# Patient Record
Sex: Female | Born: 1965 | Race: White | Hispanic: No | Marital: Married | Smoking: Current every day smoker
Health system: Southern US, Community
[De-identification: ages and names within clinical notes are randomized; demographics above are authoritative.]

## PROBLEM LIST (undated history)

## (undated) DIAGNOSIS — Z87442 Personal history of urinary calculi: Secondary | ICD-10-CM

## (undated) DIAGNOSIS — J449 Chronic obstructive pulmonary disease, unspecified: Secondary | ICD-10-CM

## (undated) DIAGNOSIS — M1990 Unspecified osteoarthritis, unspecified site: Secondary | ICD-10-CM

## (undated) DIAGNOSIS — F32A Depression, unspecified: Secondary | ICD-10-CM

## (undated) DIAGNOSIS — I739 Peripheral vascular disease, unspecified: Secondary | ICD-10-CM

## (undated) DIAGNOSIS — C801 Malignant (primary) neoplasm, unspecified: Secondary | ICD-10-CM

## (undated) DIAGNOSIS — F419 Anxiety disorder, unspecified: Secondary | ICD-10-CM

## (undated) DIAGNOSIS — I6529 Occlusion and stenosis of unspecified carotid artery: Secondary | ICD-10-CM

## (undated) DIAGNOSIS — I10 Essential (primary) hypertension: Secondary | ICD-10-CM

## (undated) DIAGNOSIS — G459 Transient cerebral ischemic attack, unspecified: Secondary | ICD-10-CM

## (undated) DIAGNOSIS — T7840XA Allergy, unspecified, initial encounter: Secondary | ICD-10-CM

## (undated) DIAGNOSIS — E785 Hyperlipidemia, unspecified: Secondary | ICD-10-CM

## (undated) DIAGNOSIS — D649 Anemia, unspecified: Secondary | ICD-10-CM

## (undated) DIAGNOSIS — E1159 Type 2 diabetes mellitus with other circulatory complications: Secondary | ICD-10-CM

## (undated) DIAGNOSIS — F329 Major depressive disorder, single episode, unspecified: Secondary | ICD-10-CM

## (undated) DIAGNOSIS — K219 Gastro-esophageal reflux disease without esophagitis: Secondary | ICD-10-CM

## (undated) DIAGNOSIS — R011 Cardiac murmur, unspecified: Secondary | ICD-10-CM

## (undated) DIAGNOSIS — J45909 Unspecified asthma, uncomplicated: Secondary | ICD-10-CM

## (undated) DIAGNOSIS — I5189 Other ill-defined heart diseases: Secondary | ICD-10-CM

## (undated) HISTORY — DX: Depression, unspecified: F32.A

## (undated) HISTORY — DX: Type 2 diabetes mellitus with other circulatory complications: E11.59

## (undated) HISTORY — DX: Anemia, unspecified: D64.9

## (undated) HISTORY — PX: FEMORAL BYPASS: SHX50

## (undated) HISTORY — DX: Peripheral vascular disease, unspecified: I73.9

## (undated) HISTORY — DX: Anxiety disorder, unspecified: F41.9

## (undated) HISTORY — DX: Other ill-defined heart diseases: I51.89

## (undated) HISTORY — DX: Transient cerebral ischemic attack, unspecified: G45.9

## (undated) HISTORY — DX: Essential (primary) hypertension: I10

## (undated) HISTORY — DX: Hyperlipidemia, unspecified: E78.5

## (undated) HISTORY — PX: CHOLECYSTECTOMY: SHX55

## (undated) HISTORY — PX: CAROTID ENDARTERECTOMY: SUR193

## (undated) HISTORY — DX: Occlusion and stenosis of unspecified carotid artery: I65.29

## (undated) HISTORY — PX: OTHER SURGICAL HISTORY: SHX169

## (undated) HISTORY — DX: Cardiac murmur, unspecified: R01.1

## (undated) HISTORY — DX: Allergy, unspecified, initial encounter: T78.40XA

## (undated) HISTORY — PX: TUBAL LIGATION: SHX77

## (undated) HISTORY — DX: Major depressive disorder, single episode, unspecified: F32.9

---

## 2000-01-21 ENCOUNTER — Encounter: Payer: Self-pay | Admitting: Family Medicine

## 2000-01-21 ENCOUNTER — Encounter: Admission: RE | Admit: 2000-01-21 | Discharge: 2000-01-21 | Payer: Self-pay | Admitting: Family Medicine

## 2002-04-13 ENCOUNTER — Other Ambulatory Visit: Admission: RE | Admit: 2002-04-13 | Discharge: 2002-04-13 | Payer: Self-pay | Admitting: Family Medicine

## 2002-04-19 ENCOUNTER — Encounter: Payer: Self-pay | Admitting: Family Medicine

## 2002-04-19 ENCOUNTER — Encounter: Admission: RE | Admit: 2002-04-19 | Discharge: 2002-04-19 | Payer: Self-pay | Admitting: Family Medicine

## 2002-06-20 ENCOUNTER — Encounter: Payer: Self-pay | Admitting: Family Medicine

## 2002-06-20 ENCOUNTER — Encounter: Admission: RE | Admit: 2002-06-20 | Discharge: 2002-06-20 | Payer: Self-pay | Admitting: Family Medicine

## 2002-07-05 ENCOUNTER — Encounter (INDEPENDENT_AMBULATORY_CARE_PROVIDER_SITE_OTHER): Payer: Self-pay | Admitting: Specialist

## 2002-07-05 ENCOUNTER — Observation Stay (HOSPITAL_COMMUNITY): Admission: RE | Admit: 2002-07-05 | Discharge: 2002-07-06 | Payer: Self-pay | Admitting: *Deleted

## 2002-07-11 ENCOUNTER — Encounter: Payer: Self-pay | Admitting: General Surgery

## 2002-07-11 ENCOUNTER — Ambulatory Visit (HOSPITAL_COMMUNITY): Admission: RE | Admit: 2002-07-11 | Discharge: 2002-07-11 | Payer: Self-pay | Admitting: General Surgery

## 2002-07-13 ENCOUNTER — Encounter: Payer: Self-pay | Admitting: Gastroenterology

## 2002-07-13 ENCOUNTER — Ambulatory Visit (HOSPITAL_COMMUNITY): Admission: RE | Admit: 2002-07-13 | Discharge: 2002-07-13 | Payer: Self-pay | Admitting: Gastroenterology

## 2002-08-04 ENCOUNTER — Encounter: Admission: RE | Admit: 2002-08-04 | Discharge: 2002-08-04 | Payer: Self-pay | Admitting: *Deleted

## 2002-10-13 ENCOUNTER — Encounter: Payer: Self-pay | Admitting: Internal Medicine

## 2002-10-13 ENCOUNTER — Inpatient Hospital Stay (HOSPITAL_COMMUNITY): Admission: EM | Admit: 2002-10-13 | Discharge: 2002-10-15 | Payer: Self-pay | Admitting: Emergency Medicine

## 2002-10-14 ENCOUNTER — Encounter: Payer: Self-pay | Admitting: Internal Medicine

## 2002-10-21 ENCOUNTER — Ambulatory Visit (HOSPITAL_COMMUNITY): Admission: RE | Admit: 2002-10-21 | Discharge: 2002-10-21 | Payer: Self-pay | Admitting: *Deleted

## 2002-10-21 ENCOUNTER — Encounter: Payer: Self-pay | Admitting: *Deleted

## 2002-11-01 ENCOUNTER — Inpatient Hospital Stay (HOSPITAL_COMMUNITY): Admission: RE | Admit: 2002-11-01 | Discharge: 2002-11-02 | Payer: Self-pay | Admitting: *Deleted

## 2002-11-01 ENCOUNTER — Encounter (INDEPENDENT_AMBULATORY_CARE_PROVIDER_SITE_OTHER): Payer: Self-pay | Admitting: Specialist

## 2002-11-29 ENCOUNTER — Inpatient Hospital Stay (HOSPITAL_COMMUNITY): Admission: RE | Admit: 2002-11-29 | Discharge: 2002-12-02 | Payer: Self-pay | Admitting: *Deleted

## 2003-06-13 ENCOUNTER — Encounter: Payer: Self-pay | Admitting: *Deleted

## 2003-06-15 ENCOUNTER — Ambulatory Visit (HOSPITAL_COMMUNITY): Admission: RE | Admit: 2003-06-15 | Discharge: 2003-06-15 | Payer: Self-pay | Admitting: *Deleted

## 2004-01-10 ENCOUNTER — Ambulatory Visit (HOSPITAL_COMMUNITY): Admission: RE | Admit: 2004-01-10 | Discharge: 2004-01-10 | Payer: Self-pay | Admitting: *Deleted

## 2004-01-25 ENCOUNTER — Inpatient Hospital Stay (HOSPITAL_COMMUNITY): Admission: RE | Admit: 2004-01-25 | Discharge: 2004-01-27 | Payer: Self-pay | Admitting: *Deleted

## 2007-03-18 ENCOUNTER — Ambulatory Visit: Payer: Self-pay | Admitting: *Deleted

## 2007-05-26 ENCOUNTER — Ambulatory Visit: Payer: Self-pay | Admitting: *Deleted

## 2008-03-09 ENCOUNTER — Ambulatory Visit: Payer: Self-pay | Admitting: *Deleted

## 2008-03-23 ENCOUNTER — Ambulatory Visit: Payer: Self-pay | Admitting: *Deleted

## 2008-03-24 ENCOUNTER — Ambulatory Visit: Payer: Self-pay | Admitting: *Deleted

## 2008-03-24 ENCOUNTER — Inpatient Hospital Stay (HOSPITAL_COMMUNITY): Admission: RE | Admit: 2008-03-24 | Discharge: 2008-03-25 | Payer: Self-pay | Admitting: *Deleted

## 2008-03-24 ENCOUNTER — Encounter (INDEPENDENT_AMBULATORY_CARE_PROVIDER_SITE_OTHER): Payer: Self-pay | Admitting: *Deleted

## 2008-04-13 ENCOUNTER — Ambulatory Visit: Payer: Self-pay | Admitting: *Deleted

## 2008-08-24 ENCOUNTER — Ambulatory Visit: Payer: Self-pay | Admitting: *Deleted

## 2008-10-12 ENCOUNTER — Ambulatory Visit: Payer: Self-pay | Admitting: *Deleted

## 2009-04-05 ENCOUNTER — Ambulatory Visit: Payer: Self-pay | Admitting: *Deleted

## 2009-10-10 ENCOUNTER — Ambulatory Visit: Payer: Self-pay | Admitting: Vascular Surgery

## 2010-03-28 ENCOUNTER — Ambulatory Visit: Payer: Self-pay | Admitting: Vascular Surgery

## 2010-05-01 ENCOUNTER — Ambulatory Visit: Payer: Self-pay | Admitting: Vascular Surgery

## 2010-11-21 ENCOUNTER — Ambulatory Visit: Payer: Self-pay | Admitting: Vascular Surgery

## 2010-12-20 ENCOUNTER — Ambulatory Visit: Payer: Self-pay | Admitting: Vascular Surgery

## 2011-05-06 NOTE — Procedures (Signed)
BYPASS GRAFT EVALUATION   INDICATION:  Patient complains of left leg claudication.  Followup  status post right-to-left fem-fem bypass graft and bilateral common  femoral artery endarterectomies and right common iliac artery stent.   HISTORY:  Diabetes:  No.  Cardiac:  No.  Hypertension:  No.  Smoking:  Quit in 2007.  Previous Surgery:  Right common iliac artery stent on June 15, 2003,  right-to-left fem-fem bypass graft on November 29, 2002.   SINGLE LEVEL ARTERIAL EXAM                               RIGHT              LEFT  Brachial:                    140                144  Anterior tibial:             86                 86  Posterior tibial:            78                 90  Peroneal:  Ankle/brachial index:        0.54               0.63   PREVIOUS ABI:  Date: 03/18/2007  RIGHT:  0.61  LEFT:  0.67   LOWER EXTREMITY BYPASS GRAFT DUPLEX EXAM:   DUPLEX:  Doppler arterial waveforms are monophasic proximal to, within  and distal to the right-to-left fem-fem bypass graft.  Peak systolic  velocity in the common iliac artery is 81 cm/s, external iliac 271 cm/s  and distal external iliac 100 cm/s.   IMPRESSION:  1. ABIs are stable from previous study bilaterally.  2. Patent right-to-left fem-fem bypass graft.  3. 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   ___________________________________________  P. Liliane Bade, M.D.   MC/MEDQ  D:  03/09/2008  T:  03/09/2008  Job:  [id number]

## 2011-05-06 NOTE — Assessment & Plan Note (Signed)
OFFICE VISIT   ALANYA, VUKELICH A  DOB:  09/28/1966                                       03/09/2008  QMVHQ#:[id number]   The patient is a 45 year old female with advanced vascular disease.  She  has a history of right carotid endarterectomy, placement of a right  common iliac stent along with right to left femoral-femoral bypass.  She  underwent lab evaluation today, including carotid duplex scan, which  revealed occlusion of a right internal carotid artery status post  endarterectomy, and a left ICA stenosis of 80-99%.   Her lower extremity evaluation reveals stable ankle brachial index at  0.54 on the right and 0.63 on the left.  Wave forms are monophasic  proximal to, within, and distal to the femoral-femoral bypass graft.  Imaging of the right iliac system does reveal a right external iliac  stenosis estimated to be greater than 50%.   Unfortunately, the patient is smoking up to 1 pack of cigarettes daily.   CURRENT MEDICATIONS:  Include Vytorin 10/20 daily, Effexor 75 mg daily,  Plavix 75 mg daily, and aspirin 325 mg daily.   She is sensitive to contrast dye.   Evaluation reveals a 45 year old female who appears older than her  stated age.  BP 146/85 left arm, 149/88 right arm, pulse 88 per minute,  respirations 18 per minute, O2 saturation 98%.   She has left carotid bruit.  Her right neck incision is well-healed.  Cranial nerves intact.  Strength equal bilaterally.   Heart sounds are normal without murmurs.  Chest is clear.   Abdomen is soft and nontender.  No masses or organomegaly.  Patent right  to left femoral-femoral bypass with soft femoral bruits present  bilaterally.  No popliteal, posterior tibial, or dorsalis pedis pulses  palpable.   The patient shows evidence of significant progression of her left  internal carotid artery disease with a high-grade stenosis and now has  an occlusion of the right internal carotid artery.  She  will be  scheduled to undergo left carotid endarterectomy on 03/24/2008 at [facility].  She will discontinue Plavix 1 week prior to this.   Balinda Quails, M.D.  Electronically Signed   PGH/MEDQ  D:  03/09/2008  T:  03/10/2008  Job:  [id number]

## 2011-05-06 NOTE — Op Note (Signed)
Andrea [name], Andrea [name]             ACCOUNT NO.:  [id number]   MEDICAL RECORD NO.:  [id number]          PATIENT TYPE:  INP   LOCATION:  2899                         FACILITY:  [facility]   PHYSICIAN:  Balinda Quails, M.D.    DATE OF BIRTH:  31-Mar-1966   DATE OF PROCEDURE:  03/24/2008  DATE OF DISCHARGE:                               OPERATIVE REPORT   SURGEON:  Balinda Quails, MD   ASSISTANT:  RNFA.   ANESTHETIC:  General endotracheal.   ANESTHESIOLOGIST:  Bedelia Person, MD   PREOPERATIVE DIAGNOSIS:  Severe progressive left internal carotid artery  stenosis.   POSTOPERATIVE DIAGNOSIS:  Severe progressive left internal carotid  artery stenosis.   PROCEDURE:  Left carotid endarterectomy with Dacron patch angioplasty.   CLINICAL NOTE:  Andrea [name] is a 45 year old vasculopath with a  history of a right carotid endarterectomy and right-to-left femoral-  femoral bypass.  Recent carotid Doppler evaluation carried out per  protocol revealed occlusion of her right carotid endarterectomy site and  a progressive left internal carotid artery stenosis.  Due to the  aggressive nature of her disease she is brought to the operating room at  this time for left carotid endarterectomy for reduction of stroke risk.   PROCEDURE IN DETAIL:  The patient was brought to the operating room in  stable condition.  Placed under general endotracheal anesthesia.  Foley  catheter and arterial line in place.  Left neck prepped and draped in a  sterile fashion.   Curvilinear skin incision made along the anterior border of the left  sternomastoid muscle.  Platysma divided with electrocautery.  Deep  dissection carried along the anterior border of the sternomastoid to the  carotid vessels.  The facial vein ligated with 2-0 silk and divided.  The common carotid artery mobilized down to the omohyoid muscle and  encircled with a vessel loop.  The vagus nerve reflected posteriorly and  preserved.  The origin of the  superior thyroid and external carotid  freed and encircled with vessel loop.  The internal carotid artery  followed distally up to the posterior belly of the digastric muscle.  The hypoglossal nerve reflected superiorly and preserved.  The distal  internal carotid artery encircled with a vessel loop.   Evaluation of the carotid bifurcation revealed small caliber vessels and  also plaque within the bulb extending into the left internal carotid  artery.  The patient administered 7000 units of heparin intravenously.   The carotid vessels controlled with clamp.  Longitudinal arteriotomy  made in the distal common carotid artery.  The arteriotomy extended  across the carotid bulb and up into the internal carotid artery beyond  the plaque.  There was a high-grade left internal carotid artery  stenosis.  Moderate amount of plaque, no ulceration.  A shunt was then  inserted.   The plaque then removed with an endarterectomy elevator.  The  endarterectomy carried down to the common carotid artery where the  plaque was divided transversely with Potts scissors.  Plaque raised up  in the bulb where the superior thyroid and external carotid were  endarterectomized using  an eversion technique.  The distal internal  carotid artery plaque feathered out well.  Fragments of plaque were  removed with fine forceps.  Site irrigated with heparin, saline and  dextran solutions.  A patch angioplasty at the endarterectomy site then  carried out with a running 6-0 Prolene suture using a Finesse Dacron  patch.  At completion of the patch angioplasty the shunt was removed.  All vessels well flushed.  Clamps were removed directing initial  antegrade flow up the external carotid artery, following this the  internal carotid artery was released.   Excellent pulse and Doppler signal in the distal internal carotid  artery.  Adequate hemostasis obtained.  The patient administered 50 mg  of protamine intravenously.   Sponges and instrument counts were correct.  Sternomastoid fascia closed with running 2-0 Vicryl suture.  Platysma  closed with running 3-0 Vicryl suture.  Skin closed with 4-0 Monocryl.  Dermabond applied.  Anesthesia was reversed in the operating room.  The  patient awakened readily.  Moved all extremities to command.  Transferred to recovery room in stable condition.      Balinda Quails, M.D.  Electronically Signed     PGH/MEDQ  D:  03/24/2008  T:  03/24/2008  Job:  [id number]

## 2011-05-06 NOTE — Procedures (Signed)
CAROTID DUPLEX EXAM   INDICATION:  Followup evaluation of known carotid artery disease.   HISTORY:  Diabetes:  No.  Cardiac:  No.  Hypertension:  No.  Smoking:  Quit in 2007.  Previous Surgery:  Right carotid endarterectomy on November 01, 2002.  CV History:  Patient reports no cerebrovascular symptoms at this time.  Amaurosis Fugax No, Paresthesias No, Hemiparesis No                                       RIGHT             LEFT  Brachial systolic pressure:         140               144  Brachial Doppler waveforms:         Triphasic         Triphasic  Vertebral direction of flow:        Antegrade         Antegrade  DUPLEX VELOCITIES (cm/sec)  CCA peak systolic                   82                135  ECA peak systolic                   372               143  ICA peak systolic                   Occluded          467  ICA end diastolic                   Occluded          217  PLAQUE MORPHOLOGY:                  Mixed             Mixed focal  PLAQUE AMOUNT:                      Severe            Severe  PLAQUE LOCATION:                    Throughout ICA, proximal ECA        Proximal ICA   IMPRESSION:  1. Occluded right ICA status post endarterectomy (cannot rule out      trickle flow).  2. Right ECA stenosis.  3. Left ICA stenosis 80-99%.   ___________________________________________  P. Liliane Bade, M.D.   MC/MEDQ  D:  03/09/2008  T:  03/09/2008  Job:  [id number]

## 2011-05-06 NOTE — Procedures (Signed)
BYPASS GRAFT EVALUATION   INDICATION:  Followup evaluation of right common iliac stent and right  to left fem-fem bypass graft.   HISTORY:  Diabetes:  No.  Cardiac:  No.  Hypertension:  No.  Smoking:  Recently quit.  Previous Surgery:  Right common iliac artery stent on 06/15/2003, right  to left fem-fem bypass graft 11/29/2002.   SINGLE LEVEL ARTERIAL EXAM                               RIGHT              LEFT  Brachial:                    148                146  Anterior tibial:             84                 78  Posterior tibial:            90                 84  Peroneal:  Ankle/brachial index:        0.61               0.57   PREVIOUS ABI:  Date:  10/12/2008  RIGHT:  0.66  LEFT:  0.65   LOWER EXTREMITY BYPASS GRAFT DUPLEX EXAM:   DUPLEX:  Doppler arterial waveforms are monophasic proximal to, within  and distal to the right to left fem-fem bypass graft.  Right common iliac artery was imaged.  No evidence of elevated peak  systolic velocities throughout.   IMPRESSION:  1. ABIs are stable from previous study bilaterally.  2. Patent right common iliac artery stent with no evidence of      restenosis.  3. Patent right to left fem-fem bypass graft.   ___________________________________________  P. Liliane Bade, M.D.   MC/MEDQ  D:  04/05/2009  T:  04/05/2009  Job:  [id number]

## 2011-05-06 NOTE — Procedures (Signed)
CAROTID DUPLEX EXAM   INDICATION:  Follow-up evaluation of known carotid artery disease.   HISTORY:  Diabetes:  No.  Cardiac:  No.  Hypertension:  No.  Smoking:  Quit in 2007.  Previous Surgery:  Right carotid endarterectomy on 11/01/2002.  Left  carotid endarterectomy on 03/24/2008.  CV History:  Patient has a known right ICA occlusion.  Amaurosis Fugax No, Paresthesias No, Hemiparesis No.                                       RIGHT             LEFT  Brachial systolic pressure:         135               130  Brachial Doppler waveforms:         Triphasic         Triphasic  Vertebral direction of flow:        Antegrade         Antegrade  DUPLEX VELOCITIES (cm/sec)  CCA peak systolic                   60                272  ECA peak systolic                   371               80  ICA peak systolic                   Occluded          201  ICA end diastolic                   Occluded          53  PLAQUE MORPHOLOGY:                  Mixed             Soft  PLAQUE AMOUNT:                      Severe            Moderate  PLAQUE LOCATION:                    Throughout ICA    Proximal Dacron  patch, proximal ICA   IMPRESSION:  1. Occluded right internal carotid artery.  2. Elevated peak systolic velocities (272 cm/s at the proximal end of      the Dacron patch in the left common carotid artery).  3. 40-59% left internal carotid artery stenosis, status post      endarterectomy.   ___________________________________________  P. Liliane Bade, M.D.   MC/MEDQ  D:  10/12/2008  T:  10/13/2008  Job:  [id number]

## 2011-05-06 NOTE — Assessment & Plan Note (Signed)
OFFICE VISIT   Andrea Andrea [name], Andrea Andrea [name]  DOB:  03-13-1966                                       05/01/2010  EAVWU#:[id number]   The patient is Andrea [name] 45 year old female who has previously been followed by  Dr. Madilyn Andrea [name] for carotid and lower extremity occlusive disease.  Since he  has left town I have assumed her care.   CHRONIC MEDICAL PROBLEMS:  Include diabetes, hypertension, elevated  cholesterol.  These are all currently followed by Dr. Egbert Andrea [name] and are  under control.  She presents today for followup regarding her carotid  and lower extremity occlusive disease.  She denies any symptoms of TIA,  amaurosis or stroke.  She denies any claudication symptoms.  She has no  symptoms of rest pain.  She has some occasional numbness and tingling in  her fingers which has been fairly recent onset.   REVIEW OF SYSTEMS:  Andrea [name] full 12 point review of systems was performed with  the patient today.  Please see intake referral form for details  regarding this.   FAMILY HISTORY:  Remarkable for her mother who had vascular disease at  age 65.   SOCIAL HISTORY:  She is single.  She has one child.  She currently  smokes one pack of cigarettes per day.   Femoral-femoral bypass in December of 2003.  This was revised in  February of 2005.  She has also had two previous iliac artery stents.  She had Andrea [name] left carotid endarterectomy in April of 2009.  She had Andrea [name] right  carotid endarterectomy in November of 2003.  She has Andrea [name] known chronic  right internal carotid artery occlusion.   PAST SURGICAL HISTORY:  Also remarkable for tubal ligation,  cholecystectomy and Andrea [name] C-section.   MEDICATIONS:  1. Include glipizide 10 mg twice Andrea [name] day.  2. Plavix 75 mg once Andrea [name] day.  3. Aspirin 325 mg once Andrea [name] day.  4. Amlodipine 10 mg once Andrea [name] day.  5. Effexor 75 mg once Andrea [name] day.  6. Xanax 0.25 mg t.i.d. p.r.n.  7. Vytorin 10/20 once Andrea [name] day.   ALLERGIES:  She has allergy listed to contrast which causes her hives  and  this has been okay with Benadryl in the past.   She had bilateral ABIs performed on 03/28/2010 which were 0.79 on the  right, 0.73 on the left which is slightly decreased from October of  2010.  She did have Andrea [name] patent femoral-femoral bypass graft at that time  as well as Andrea [name] patent right common iliac artery stent.   She had Andrea [name] carotid duplex exam also on 03/28/2010 which showed chronic  occlusion of her right internal carotid artery and Andrea [name] 60%-80% stenosis of  the left internal carotid artery.  She had antegrade vertebral flow  bilaterally.   PHYSICAL EXAMINATION:  Vital signs:  On physical exam today blood  pressure is 149/81 in the left arm, 158/85 in the right arm, heart rate  is 89 and regular, respirations 20.  HEENT:  Unremarkable.  Neck:  Has  2+ carotid pulses without bruit.  Chest:  Clear to auscultation.  Cardiac:  Regular rate and rhythm without murmur.  Abdomen:  Soft,  nontender, nondistended.  No masses.  Extremities:  She has 1+ femoral  pulses bilaterally.  She has absent popliteal and pedal pulses  bilaterally.  She has 2+ radial pulses  bilaterally.  Neurological:  She  has symmetric upper extremity and lower extremity motor strength which  is 5/5 and symmetric.  Skin:  Has no open ulcers or rashes.  Musculoskeletal:  Exam shows no significant major joint deformities.   ASSESSMENT:  Carotid and lower extremity arterial occlusive disease  stable currently.  She will continue risk factor modification and try to  improve her blood pressure control, elevation of cholesterol and control  her diabetes.  We also spent some time today regarding smoking  cessation.  She will continue her aspirin and Plavix as antiplatelet  therapy.  She will follow up in six months' time for repeat carotid  duplex and ABIs.  She will return sooner if she has any symptoms of  neurologic dysfunction or worsening claudication symptoms.     Andrea Hora. Fields, MD  Electronically Signed   CEF/MEDQ   D:  05/01/2010  T:  05/02/2010  Job:  [id number]   cc:   Cheri Rous

## 2011-05-06 NOTE — Procedures (Signed)
CAROTID DUPLEX EXAM   INDICATION:  Follow up of carotid disease.   HISTORY:  Diabetes:  No.  Cardiac:  No.  Hypertension:  No.  Smoking:  Quit.  Previous Surgery:  Right carotid endarterectomy in 10/2002, left carotid  endarterectomy in 03/2008.  CV History:                                       RIGHT             LEFT  Brachial systolic pressure:         130               136  Brachial Doppler waveforms:         Triphasic         Triphasic  Vertebral direction of flow:        Antegrade         Antegrade  DUPLEX VELOCITIES (cm/sec)  CCA peak systolic                   53                115  ECA peak systolic                   361               161  ICA peak systolic                   Occluded          305  ICA end diastolic                   Occluded          87  PLAQUE MORPHOLOGY:                  Calcified         Calcified  PLAQUE AMOUNT:                      Severe            Moderate  PLAQUE LOCATION:                    ICA               ICA   IMPRESSION:  1. Right internal carotid artery is occluded.  2. Right external carotid artery with 60-79% stenosis.  3. Left internal carotid artery with 60-79% stenosis.     ___________________________________________  Larina Earthly, M.D.   CJ/MEDQ  D:  10/10/2009  T:  10/10/2009  Job:  [id number]

## 2011-05-06 NOTE — Procedures (Signed)
BYPASS GRAFT EVALUATION   INDICATION:  Right femoral-to-femoral bypass graft, right CIA stent.   HISTORY:  Diabetes:  No.  Cardiac:  No.  Hypertension:  No.  Smoking:  Quit.  Previous Surgery:  Right CIA stent in June, 2004, fem-fem bypass graft  in December, 2003.   SINGLE LEVEL ARTERIAL EXAM                               RIGHT              LEFT  Brachial:                    130                136  Anterior tibial:             95                 98  Posterior tibial:            117                118  Peroneal:  Ankle/brachial index:        0.86               0.87   PREVIOUS ABI:  Date: 04/05/09  RIGHT:  0.61  LEFT:  0.57   LOWER EXTREMITY BYPASS GRAFT DUPLEX EXAM:   DUPLEX:  Biphasic waveforms throughout the bypass graft and native  arteries.   IMPRESSION:  1. Ankle brachial indices suggest mild disease.  2. Patent bypass graft, no evidence of stenosis noted.  3. Common iliac artery stent also appears patent with no evidence of      stenosis.   ___________________________________________  Larina Earthly, M.D.   CJ/MEDQ  D:  10/10/2009  T:  10/10/2009  Job:  [id number]

## 2011-05-06 NOTE — Procedures (Signed)
BYPASS GRAFT EVALUATION   INDICATION:  Followup evaluation of lower extremity revascularization  procedure.   HISTORY:  Diabetes:  No.  Cardiac:  No.  Hypertension:  No.  Smoking:  Quit in 2007.  Previous Surgery:  Right common iliac stent.  Right to left fem-fem  bypass graft.   SINGLE LEVEL ARTERIAL EXAM                               RIGHT              LEFT  Brachial:                    135                130  Anterior tibial:             89                 87  Posterior tibial:            88                 88  Peroneal:  Ankle/brachial index:        0.66               0.65   PREVIOUS ABI:  Date:  03/09/2008  RIGHT:  0.54  LEFT:  0.63   LOWER EXTREMITY BYPASS GRAFT DUPLEX EXAM:   DUPLEX:  Doppler arterial waveform is monophasic throughout the right  common iliac artery and the right to left fem-fem bypass graft.   IMPRESSION:  1. ABIs are stable from previous study bilaterally.  2. Patent fem-fem bypass graft.  3. Patent right common iliac artery stent.       ___________________________________________  P. Liliane Bade, M.D.   MC/MEDQ  D:  10/12/2008  T:  10/13/2008  Job:  [id number]

## 2011-05-06 NOTE — Procedures (Signed)
BYPASS GRAFT EVALUATION   INDICATION:  Right femoral-to-femoral bypass graft, right common iliac  stent.   HISTORY:  Diabetes:  No.  Cardiac:  No.  Hypertension:  No.  Smoking:  Quit.  Previous Surgery:  Right common iliac stent in June, 2004, femoral-  femoral bypass graft in December, 2003.   SINGLE LEVEL ARTERIAL EXAM                               RIGHT              LEFT  Brachial:                    168                152  Anterior tibial:             123                108  Posterior tibial:            132                123  Peroneal:  Ankle/brachial index:        0.79               0.73   PREVIOUS ABI:  Date: 10/10/09  RIGHT:  0.86  LEFT:  0.87   LOWER EXTREMITY BYPASS GRAFT DUPLEX EXAM:   DUPLEX:  Patent femoral-femoral bypass graft with no evidence of  stenosis.   IMPRESSION:  1. Bilateral ankle brachial indices suggest moderate arterial disease.  2. Patent femoral-femoral bypass graft with no evidence of stenosis.  3. Patent right common iliac artery stent.         ___________________________________________  Larina Earthly, M.D.   CB/MEDQ  D:  03/28/2010  T:  03/28/2010  Job:  [id number]

## 2011-05-06 NOTE — Assessment & Plan Note (Signed)
OFFICE VISIT   MACHELL, WIRTHLIN A  DOB:  14-Mar-1966                                       10/12/2008  EAVWU#:[id number]   The patient has advanced vascular disease with a history of bilateral  carotid endarterectomies and a right-to-left femoral-femoral bypass.   She follows up with regular surveillance here in the office.  Her  carotid Dopplers reveal chronic occlusion of the right internal carotid  artery.  She is status post left carotid endarterectomy with an area of  restenosis at the proximal Dacron patch with peak systolic velocity 272  cm/second.  The internal carotid artery on the left reveals a 40-59%  stenosis.  She has been free of any symptoms.  Denies sensory, motor or  visual deficit.   Lower extremity Doppler reveals patent femoral-femoral graft with intact  right iliac stent and reasonable velocities through the right-to-left  femoral-femoral graft.  Ankle brachial indices measure 0.66 on the right  and 0.65 on the left.   On specific questioning the patient does describe some mild claudication  symptoms when walking uphill or at a fast pace otherwise she has  reasonable exercise tolerance.   PHYSICAL EXAMINATION:  Blood pressure 131/91, pulse is 80 per minute,  respirations 20 per minute.  Soft left carotid bruit.  Cranial nerves  intact.  Strength equal bilaterally.  Normal gait.  Lower extremities  reveal an intact femoral-femoral graft with intact femoral pulses  bilaterally.  No popliteal, posterior tibial or dorsalis pedis pulses.  No ankle edema.   The patient has longstanding chronic peripheral vascular disease  including cerebrovascular disease with moderate restenosis of left  common carotid artery status post endarterectomy and a patent right-to-  left femoral-femoral graft.  We will plan continued surveillance with 6  month followup.   Balinda Quails, M.D.  Electronically Signed   PGH/MEDQ  D:  10/12/2008  T:   10/13/2008  Job:  [id number]   cc:   Leanne Chang, M.D.

## 2011-05-06 NOTE — H&P (Signed)
NAME:  RICKELL, WIEHE             ACCOUNT NO.:  [id number]   MEDICAL RECORD NO.:  [id number]          PATIENT TYPE:  INP   LOCATION:  NA                           FACILITY:  [facility]   PHYSICIAN:  Balinda Quails, M.D.    DATE OF BIRTH:  Dec 04, 1966   DATE OF ADMISSION:  DATE OF DISCHARGE:                              HISTORY & PHYSICAL   ADMISSION DIAGNOSIS:  Severe progressive left internal carotid artery  stenosis.   HISTORY:  Andrea [name] is a 45 year old female with a history of  advanced peripheral vascular disease.  She has previously undergone  right carotid endarterectomy, a right common iliac stent along with  right-to-left femoral-femoral bypass.   She is followed in the CVTS office with carotid duplex protocol.  This  reveals progression of her left internal carotid artery stenosis to  greater than 80%.  She also has an occlusion of her right internal  carotid artery, status post endarterectomy.   The patient denies neurologic symptoms.  No sensory, motor or visual  deficit.  No speech problems.  No gait abnormality.  No syncope or  presyncope.  Denies headache.   PAST MEDICAL HISTORY:  1. Peripheral vascular disease, status post right-to-left femoral-      femoral bypass.  2. Depression.  3. Gastroesophageal reflux disease.  4. Extracranial cerebrovascular occlusive disease, status post right      carotid endarterectomy.  5. Dyslipidemia.  6. Preeclampsia during pregnancy.  7. Pneumonia.   MEDICATIONS:  1. Vytorin 10/20 mg 1 tablet daily.  2. Effexor 75 mg daily.  3. Plavix 75 mg daily.  4. Aspirin 325 mg daily.  5. Zantac OTC one tablet p.r.n.   ALLERGIES:  CONTRAST DYE.   SOCIAL HISTORY:  The patient is divorced twice.  She has one child.  She  is on disability.  She smokes one pack of cigarettes daily.  Does not  consume alcohol on a regular basis.   FAMILY HISTORY:  Her mother is living, has a history of coronary artery  disease with coronary artery  bypass carried out at age 53.  She also has  a history of hypertension and peripheral vascular disease.  Her father  has a history of coronary artery disease and hypertension.  Her maternal  grandmother has a history of coronary artery disease and arthritis.  Maternal grandfather has a history of coronary artery disease,  tuberculosis, and died in his 4s.   PHYSICAL EXAM:  GENERAL:  A 45 year old female who appears older than  her stated age.  Blood pressure is 145/80, pulse is 88 per minute, respirations 18 per  minute, O2 saturation 98%.  HEENT:  Mouth and throat are clear.  Normocephalic.  Extraocular  movements intact.  NECK:  Supple.  No thyromegaly or adenopathy.  Right carotid  endarterectomy scar.  CARDIOVASCULAR:  Normal heart sounds without murmurs.  Left carotid  bruit.  No gallops or rubs.  Regular rate and rhythm.  CHEST:  Equal air entry bilaterally without rales or rhonchi.  ABDOMEN:  Soft, nontender.  No masses or organomegaly.  LOWER EXTREMITIES:  Patent right-to-left femoral-femoral bypass  with  soft femoral bruits bilaterally.  No popliteal, posterior tibial or  dorsalis pedis pulses.  NEUROLOGIC:  Cranial nerves intact.  Strength equal bilaterally.  2+  reflexes.  SKIN:  Warm, dry, without ulceration or rash.   IMPRESSION:  1. Asymptomatic severe left internal carotid artery stenosis.  2. Peripheral vascular disease.  3. Dyslipidemia.  4. Depression.  5. History of preeclampsia.  6. Gastroesophageal reflux disease.   ADMISSION PLAN:  The patient will be admitted electively for a left  carotid endarterectomy.  Risks of the operative procedure were reviewed  with the patient with a major morbidity and mortality of 1-2% to include  but not limited to MI, CVA, cranial nerve injury and death.      Balinda Quails, M.D.  Electronically Signed     PGH/MEDQ  D:  03/24/2008  T:  03/24/2008  Job:  [id number]

## 2011-05-06 NOTE — Assessment & Plan Note (Signed)
OFFICE VISIT   Andrea [name], Andrea [name]  DOB:  1966-01-11                                       08/24/2008  IONGE#:[id number]   The patient has had some difficulty with her disability.  Apparently the  state has taken her off of disability and on review of the disability  form it appears that this was inadvertently filled out to be [name] recovery  from [name] left carotid endarterectomy.  I have refiled the form for  permanent disability due to her peripheral vascular disease and  cerebrovascular disease.   Balinda Quails, M.D.  Electronically Signed   PGH/MEDQ  D:  08/24/2008  T:  08/26/2008  Job:  [id number]

## 2011-05-06 NOTE — Procedures (Signed)
CAROTID DUPLEX EXAM   INDICATION:  Followup evaluation, known carotid artery disease.   HISTORY:  Diabetes:  Yes  Cardiac:  No  Hypertension:  Yes  Smoking:  Yes, 1 pack a day  Previous Surgery:  Right carotid endarterectomy November 2003, left  carotid endarterectomy April 2009  CV History:  Currently asymptomatic  Amaurosis Fugax No, Paresthesias No, Hemiparesis No                                       RIGHT             LEFT  Brachial systolic pressure:         129               125  Brachial Doppler waveforms:         Normal            Normal  Vertebral direction of flow:        Antegrade         Antegrade  DUPLEX VELOCITIES (cm/sec)  CCA peak systolic                   54                107  ECA peak systolic                   330               132  ICA peak systolic                   Occluded          232  ICA end diastolic                   Occluded          66  PLAQUE MORPHOLOGY:                                    Heterogenous  PLAQUE AMOUNT:                                        Minimal/moderate  PLAQUE LOCATION:                                      ICA, bifurcation   IMPRESSION:  1. Right ICA known occlusion.  2. Right ECA stenosis.  3. Right ICA velocities suggest a 60% to 79% stenosis.   ___________________________________________  Janetta Hora Darrick Penna, MD   EM/MEDQ  D:  11/21/2010  T:  11/21/2010  Job:  [id number]

## 2011-05-06 NOTE — Assessment & Plan Note (Signed)
OFFICE VISIT   DESERI, LOSS A  DOB:  1966/04/21                                       04/13/2008  ZOXWR#:[id number]   The patient underwent a left carotid endarterectomy on 03/24/2008 at  [facility].  This was an uneventful operative procedure and she  was discharged home in good condition.   Since discharge, the patient has no major complaints.   Blood pressure 169/96, pulse is 93 per minute and respirations 18 per  minute.  Left neck incision healing unremarkably.  No recurrent bruit.  Cranial nerves intact.  Strength equal bilaterally.   The patient is recovering well from her left carotid endarterectomy.  She can now return to work.  I will plan to follow up with her again in  6 months with a carotid Doppler evaluation.   Balinda Quails, M.D.  Electronically Signed   PGH/MEDQ  D:  04/13/2008  T:  04/14/2008  Job:  [id number]   cc:   Leanne Chang, M.D.

## 2011-05-06 NOTE — Procedures (Signed)
CAROTID DUPLEX EXAM   INDICATION:  Follow up carotid artery disease.   HISTORY:  Diabetes:  No.  Cardiac:  No.  Hypertension:  No.  Smoking:  Quit.  Previous Surgery:  Right carotid endarterectomy on 11/03, left carotid  endarterectomy, 03/2008.  CV History:  Amaurosis Fugax No, Paresthesias No, Hemiparesis No.                                       RIGHT             LEFT  Brachial systolic pressure:         168               152  Brachial Doppler waveforms:         Triphasic         Triphasic  Vertebral direction of flow:        Antegrade         Antegrade  DUPLEX VELOCITIES (cm/sec)  CCA peak systolic                   52                104  ECA peak systolic                   307               151  ICA peak systolic                   Occluded          221  ICA end diastolic                   Occluded          56  PLAQUE MORPHOLOGY:                  Heterogenous      Heterogenous  PLAQUE AMOUNT:                      Severe            Moderate-to-severe  PLAQUE LOCATION:                    ICA, ECA          ICA, ECA   IMPRESSION:  1. Right internal carotid artery appears occluded.  2. Left internal carotid artery suggests 60% to 79% stenosis.  3. Bilateral external carotid artery stenosis.  4. Antegrade flow in bilateral vertebrals.      ___________________________________________  Larina Earthly, M.D.   CB/MEDQ  D:  03/28/2010  T:  03/28/2010  Job:  [id number]

## 2011-05-06 NOTE — Procedures (Signed)
CAROTID DUPLEX EXAM   INDICATION:  Followup evaluation of known carotid artery disease.   HISTORY:  Diabetes:  No  Cardiac:  No  Hypertension:  No  Smoking:  The patient recently quit  Previous Surgery:  Right carotid endarterectomy November 01, 2002, left  carotid endarterectomy March 24, 2008.  CV History:  Previous duplex on October 12, 2008 revealed an occluded  right ICA and 40 to 59% left ICA stenosis.  Amaurosis Fugax  No, Paresthesias  No, Hemiparesis No                                       RIGHT             LEFT  Brachial systolic pressure:         148               146  Brachial Doppler waveforms:         triphasic         triphasic  Vertebral direction of flow:        antegrade         antegrade  DUPLEX VELOCITIES (cm/sec)  CCA peak systolic                   79                98  ECA peak systolic                   405               144  ICA peak systolic                   occluded          286  ICA end diastolic                   occluded          73  PLAQUE MORPHOLOGY:                  mixed             soft  PLAQUE AMOUNT:                      severe            moderate  PLAQUE LOCATION:                    Proximal ECA, throughout ICA        Proximal ICA   IMPRESSION:  1. Occluded right internal carotid artery.  2. Right external carotid artery stenosis.  3. 60 to 79% left internal carotid artery stenosis.  4. Elevated peak systolic velocities in the left internal carotid      artery are higher than previously recorded on October 12, 2008.   ___________________________________________  P. Liliane Bade, M.D.   MC/MEDQ  D:  04/05/2009  T:  04/05/2009  Job:  [id number]

## 2011-05-09 NOTE — Consult Note (Signed)
NAME:  Andrea [name], Andrea [name]                       ACCOUNT NO.:  [id number]   MEDICAL RECORD NO.:  [id number]                   PATIENT TYPE:  INP   LOCATION:  6525                                 FACILITY:  [facility]   PHYSICIAN:  Cassell Clement, MD                DATE OF BIRTH:  [birth year]   DATE OF CONSULTATION:  DATE OF DISCHARGE:                                   CONSULTATION   CHIEF COMPLAINT:  The patient is a 45 year old white female admitted with  chest pressure. It is mainly in the right anterior chest, going to the right  shoulder and arm. It was not sharp. It was a little worse with a deep breath  however. There was no diaphoresis, nausea or vomiting, and she was not  dyspneic. She has had a recent sinus infection and ear infection and she has  just finished three weeks of antibiotics two days ago. She initially was  treated with Z-pack and then followed up with ten days of Amoxicillin. She  has no prior history of known heart problems or hypertension.   MEDICATIONS:  At the present time include Paxil and Nexium.   OPERATIONS:  Cholecystectomy in 2003, C-section in 1997, tubal ligation in  1999.   SOCIAL HISTORY:  She is a Database administrator at [employer] She is also a part-  time hair dresser. She is married and has a 70-year-old son. She smokes one  pack of cigarettes per day and rarely drinks any alcohol.   ALLERGIES:  No known drug allergies.   FAMILY HISTORY:  She has a very strong family history of coronary disease.  Her mother had her first coronary artery bypass grafting surgery at age 71  years and had a re-do at age 14 years and now is 43 years old. Her father  had a prior heart attack in his early 85's and is now 63 years. The patient  has a brother and sister who have no coronary disease.   REVIEW OF SYSTEMS:  She does not have any history of diabetes mellitus. She  does have a history of high triglycerides and low HDL. She denies any  history of peptic ulcer  disease. She has had history of acid reflux. Denies  any GU symptoms. She does have what sounds like claudication of the left leg  and buttock with walking. She has had no transischemic attack symptoms.  Remainder of review of systems is negative.   PHYSICAL EXAMINATION:  VITAL SIGNS: Blood pressure 130/80, pulse 80 and  regular, respiratory rate normal.  HEENT: Right carotid bruit.  CHEST: Clear.  HEART: No murmur, rub, or gallop.  ABDOMEN: Negative.  EXTREMITIES: Absent pulses of the left foot. I cannot feel any definite  pulse in the left femoral. In the right femoral, she has good pedal pulses  but she has got a right femoral bruit.   DIAGNOSTIC STUDIES:  EKG times two has been normal.  LABORATORY DATA:  Cardiac enzymes have been negative so far.   IMPRESSION:  1. Chest pain, rule out myocardial infarction.  2. Asymptomatic right carotid bruit.  3. Possible aortoiliac occlusive disease.  4. Cigarette abuse.   RECOMMENDATIONS:  Will get serial enzymes and EKG's. If enzymes are  negative, will proceed with an Adenosine Cardiolite stress test. Will also  get carotid Dopplers to evaluate right carotid bruit and arterial Dopplers  of the legs to evaluate diminished pulses in the left leg and symptoms of  claudication. The patient will also be given counseling regarding smoking  cessation.                                               Cassell Clement, MD    TB/MEDQ  D:  10/13/2002  T:  10/13/2002  Job:  [id number]   cc:   Deirdre Peer. Polite, M.D.  [address]  [place], [place] [address]  Fax: [phone]   Leanne Chang, M.D.  [address]  [place]  [place] [address]  Fax: [phone]

## 2011-05-09 NOTE — Op Note (Signed)
NAME:  Andrea [name], Andrea [name]                       ACCOUNT NO.:  [id number]   MEDICAL RECORD NO.:  [id number]                   PATIENT TYPE:  OIB   LOCATION:  2873                                 FACILITY:  [facility]   PHYSICIAN:  Balinda Quails, M.D.                 DATE OF BIRTH:  12/19/1966   DATE OF PROCEDURE:  06/15/2003  DATE OF DISCHARGE:                                 OPERATIVE REPORT   PHYSICIAN:  Balinda Quails, M.D.   DIAGNOSES:  Recurrent lower extremity claudication.   PROCEDURE:  1. Abdominal aortogram with bilateral lower extremity runoff arteriography.  2. Percutaneous transluminal angioplasty right common iliac artery.  3. Placement of 8 x 60 Smart stent right common iliac artery.   ACCESS:  Right common femoral artery 6-French sheath.   CONTRAST:  225 ml of Visipaque.   COMPLICATIONS:  None apparent.   CLINICAL NOTE:  The patient is a 45 year old female with a history of  peripheral vascular disease.  She has previously undergone right to left  femoral-femoral bypass in December of 2003 for left iliac occlusion.  She  presented with recurrent bilateral lower extremity claudication.  Doppler  evaluation reveals evidence of probable right iliac occlusive disease.  Brought to the catheterization laboratory at this time for diagnostic  arteriography and possible intervention.   PROCEDURE NOTE:  The patient brought to the catheterization lab in stable  condition.  Placed in the supine position.  Both groins prepped and draped  in the sterile fashion.  Administered 2 mg of Nubain and 3 mg Versed  intravenously.   Skin and subcutaneous tissue of the right groin instilled with 1% Xylocaine.  A needle was introduced in the right common femoral artery.  A 0.35 Wholey  guidewire advanced through the needle.  This initially passed into the  femoral-femoral bypass graft.  Several attempts were made to pass the  guidewire into the right external iliac artery.  The needle had  to be  removed.  Pressure placed on the right groin.  The right groin then  reaccessed lower with the needle and the Wholey guidewire advanced to the  right external iliac artery.  This was advanced up into the abdominal aorta  under fluoroscopy.  The needle was removed.  A 5-French sheath advanced over  the guidewire, dilator removed, and sheath flushed with heparin saline  solution.   Standard pigtail catheter was then advanced over the guidewire to the mid  abdominal aorta.  Standard AP mid abdominal aortogram obtained.  This  revealed single widely patent bilateral renal arteries.  The infrarenal  aorta revealed mild to moderate tapering with plaque at the terminal aorta.  Extensive collaterals from lumbars were noted.  Left common iliac artery was  occluded.  The left external and internal iliac arteries were reconstituted  via collaterals.  There was a high grade stenosis just beyond the origin of  the right  common iliac artery.  This extended for 3-4 cm.  The right  external iliac artery was normal in caliber.  The right internal iliac  artery was patent and revealed good flow.   The pigtail catheter was brought down to the aortic bifurcation.  The pelvic  arteriogram obtained in the AP projection.  This again verified a left  common iliac occlusion.  The right common iliac artery revealed a high grade  stenosis just beyond its origin.  This extended 3-4 cm.  The right external  iliac artery and internal iliac artery were normal in caliber.  The right to  left femoral-femoral graft was patent.  There was, however, sluggish flow.   The 5-French sheath was then removed from the right groin and a long 6-  French sheath advanced over the guidewire.  This was positioned just beyond  the right common iliac stenosis.  A retrograde arteriogram was obtained with  injection of contrast through the sheath.  __________ tape placed over the  right common iliac artery course.  The site of  stenosis was identified.  Pre  dilatation with an Agiltrac 7 x 40 mm balloon was carried out at 8  atmospheres x30 seconds.  The balloon positioned, then readjusted, and again  deployed at 8 atmospheres x30 seconds.  Following balloon angioplasty  completion of arteriogram was obtained.  This revealed some underlying  dissection in the right common iliac artery and a less than ideal technical  result.  Therefore, a Smart stent was placed across the area of angioplasty.  A 9 x 60 Smart stent was advanced over the guidewire.  This was positioned  and deployed appropriately by __________ markers.  Completion of the  deployment of the Smart stent, a repeat arteriogram was obtained.  This did  reveal some residual stenosis in the mid portion of the right common iliac  artery.  An 8 x 40 Powerflex balloon was therefore advanced over the  guidewire and deployed within the stent at 8 atmospheres x30 seconds for two  deployments.  At completion of this the balloon was removed.  Again, a  retrograde arteriogram obtained.  This revealed an excellent technical  result without evidence of residual stenosis or dissection.   The pigtail catheter was then readvanced over the guidewire and a completion  arteriogram with lower extremity runoff was obtained.  This revealed the  right external iliac artery to be widely patent.  There was brisk flow  through the femoral-femoral graft.  No evidence of stenosis at the right or  left femoral anastomoses.  The profunda femoris, superficial femoral,  popliteal, and tibial vessels were patent bilaterally.   This completed the arteriogram procedure.  The patient did receive 4000  units of heparin intravenously prior to undertaking the intervention.  ACT  was checked and right femoral sheaths removed once appropriate.   There were no apparent complications.   FINAL IMPRESSION:  1. 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 2. Patent right to left  femoral-femoral bypass graft.  3. Patent infrainguinal arterial supply bilaterally.  4. Successful balloon angioplasty and stenting of right common iliac artery     stenosis.    DISPOSITION:  These results have been reviewed with patient and family.  The  patient will be discharged today as there were no apparent complications.  To continue aspirin.  Would return to the office in approximately three to  four weeks.  Balinda Quails, M.D.    PGH/MEDQ  D:  06/15/2003  T:  06/15/2003  Job:  [id number]   cc:   Peripheral Vascular Cath Lab   Leanne Chang, M.D.  [address]  [place]  [place] [address]  Fax: [phone]

## 2011-05-09 NOTE — H&P (Signed)
ME:  Andrea [name], GRILLI                           ACCOUNT NO.:  [id number]   MEDICAL RECORD NO.:  [id number]                   PATIENT TYPE:   LOCATION:                                       FACILITY:  [facility]   PHYSICIAN:  Balinda Quails, M.D.                 DATE OF BIRTH:  1966/09/18   DATE OF ADMISSION:  11/29/2002  DATE OF DISCHARGE:                                HISTORY & PHYSICAL   CHIEF COMPLAINT:  Peripheral vascular disease.   HISTORY OF PRESENT ILLNESS:  This is a 45 year old female who said that she  is having pain in her left leg.  The pain has been present for about five  years, but now limits her ambulation.  It is worse with going uphill and  extended distances.  It often wakes her up at night.  She denies any rest  pain.  She denies any non-healing foot wounds.  She does report occasional  numbness but no paralysis of this foot.   PAST MEDICAL HISTORY:  1. Peripheral vascular disease.  2. Depression.  3. GERD.  4. Hypertension.  5. Extracranial cerebrovascular occlusive disease.   PAST SURGICAL HISTORY:  1. Cholecystectomy.  2. C-section.  3. Tubal ligation.  4. Right carotid endarterectomy.   ALLERGIES:  The patient is allergic to IV CONTRAST DYE.   MEDICATIONS:  1. Aspirin 325 mg p.o. every day.  2. Paxil 30 mg p.o. every day.  3. Lipitor 20 mg p.o. every day.  4. Nexium 40 mg p.o. every day.   FAMILY HISTORY:  The patient's mother has coronary artery disease and  hypertension.  She has a father who has had a myocardial infarction and  hypertension.  There is no family history of cancer, diabetes mellitus or  cerebrovascular accident.   SOCIAL HISTORY:  The patient is married, lives with her husband and has one  child.  She denies any alcohol use.  She quit smoking in 2003 but smoked 1  pack a day for about 20 years.   REVIEW OF SYSTEMS:  GENERAL:  The patient denies any fever or chills, night  sweats or frequent illnesses.  HEAD:  The patient denies  any head injuries  or loss of consciousness.  EYES:  The patient denies any glaucoma or  cataracts.  EARS:  The patient denies any tinnitus, vertigo, hearing loss or  ear infections.  NOSE:  The patient denies any epistaxis, rhinitis or  sinusitis.  MOUTH:  The patient denies any problems with dentition or  frequent sore throat.  NECK:  The patient denies any lumps, masses or pain  with range of motion of her neck.  CARDIOVASCULAR:  The patient has  hypertension which is treated medically.  Denies any cardiac arrhythmias.  PULMONARY:  The patient denies any asthma, bronchitis, emphysema or  pneumonia.  GI:  The patient denies any nausea, vomiting, diarrhea,  constipation,  hematochezia or melena.  GU:  The patient denies any urinary  tract infections or incontinence.  ENDOCRINE:  The patient denies any  diabetes mellitus or thyroid disease.  MUSCULOSKELETAL:  The patient denies  any arthritis, arthralgias or myalgias.  NEUROLOGIC:  The patient denies any  stroke or transient ischemic attacks.   PHYSICAL EXAMINATION:  VITALS:  Blood pressure is 160/80.  Pulse is 84 and  regular.  Respirations are 16.  GENERAL:  The patient is alert and oriented x3 and is in no acute distress.  HEENT:  Head is atraumatic and normocephalic.  Eyes:  Pupils are equal,  round and reactive to light and accommodation.  Extraocular motions are  intact without scleral icterus or nystagmus.  Ears:  Auditory acuity is  grossly intact.  Nose:  Nasal patency intact.  Sinuses are nontender.  Mouth  is moist without exudates.  NECK:  Neck is supple without JVD, lymphadenopathy, carotid bruits or  thyromegaly.  LUNGS:  Lungs are clear to auscultation bilaterally.  CARDIOVASCULAR:  Exam revealed a regular rate and rhythm without murmurs,  gallops or rubs.  ABDOMEN:  Abdomen was soft, nontender and nondistended.  Positive bowel  sounds in all four quadrants.  EXTREMITIES:  Extremities reveal no cyanosis, clubbing or  edema.  PERIPHERAL PULSES:  Exam revealed 2+ carotid pulses bilaterally.  She had a  1+ femoral pulse on the left, 2+ on the right.  She had a 1+ popliteal pulse  on the right, absent on the left.  She had absent pedal pulses on the left  foot.  She had a 1+ dorsalis pedis pulse on the right foot.  Her posterior  tibial pulse in the right foot was nonpalpable.  NEUROLOGIC:  Cranial nerves II-XII are grossly intact.  Her gait was steady.  She had 5+ equal muscle strength in all four extremities.   IMPRESSION:  Left lower extremity claudication.   PLAN:  The patient will undergo a fem-fem bypass graft by Dr. Balinda Quails  on November 29, 2002.     Levin Erp. Prentice Docker, M.D.    BGS/MEDQ  D:  11/25/2002  T:  11/25/2002  Job:  [id number]

## 2011-05-09 NOTE — Op Note (Signed)
NAME:  Andrea [name], Andrea [name]                       ACCOUNT NO.:  [id number]   MEDICAL RECORD NO.:  [id number]                   PATIENT TYPE:  INP   LOCATION:  NA                                   FACILITY:  [facility]   PHYSICIAN:  Balinda Quails, M.D.                 DATE OF BIRTH:  June 12, 1966   DATE OF PROCEDURE:  11/01/2002  DATE OF DISCHARGE:                                 OPERATIVE REPORT   PREOPERATIVE DIAGNOSIS:  Severe right internal carotid artery stenosis.   POSTOPERATIVE DIAGNOSIS:  Severe right internal carotid artery stenosis.   OPERATION PERFORMED:  Right carotid endarterectomy, Dacron patch  angioplasty.   SURGEON:  Balinda Quails, M.D.   ASSISTANT:  Lissa Merlin, P.A.   ANESTHESIA:  General endotracheal.   ANESTHESIA:  Kaylyn Layer. Michelle Piper, M.D.   INDICATIONS FOR PROCEDURE:  The patient is a 45 year old female with a  history of heavy tobacco abuse and strong family history of vascular  disease.  She recently presented to the hospital with chest pain and was  worked up for this with negative Cardiolite evaluation.  During work-up she  was found to have a right carotid bruit, carotid Doppler evaluation revealed  severe right internal carotid artery stenosis.  The patient was  asymptomatic.   The patient was seen in consultation and it was recommended that she undergo  right carotid endarterectomy for reduction of stroke risk.  The patient  consented to surgery.  The risks and benefits of the operative procedure  were explained to the patient in detail including patient morbidity,  mortality of approximately 1 to 2%.   DESCRIPTION OF PROCEDURE:  The patient was brought to the operating room in  stable condition.  Placed in supine position.  General endotracheal  anesthesia was induced.  Foley catheter and arterial line in place.  The  right neck and chest prepped and draped in sterile fashion.   A curvilinear skin incision was made along the anterior border of the right  sternomastoid muscle.  The subcutaneous tissue and platysma were divided  with electrocautery.  Deep dissection carried down along the sternomastoid  muscle to the carotid sheath.  The common carotid artery was mobilized  proximally and encircled with a vessel loop.  Facial vein ligated with 2-0  silk and divided.  The carotid bifurcation exposed.  The origin of the  superior thyroid and external carotid were encircled with vessel loops.   The distal internal carotid artery was followed up to the posterior belly of  the digastric muscle and encircled with a vessel loop.  The carotid  bifurcation revealed focal plaque at the right internal carotid artery  origin.  The patient was administered 7000 units of heparin intravenously.  Adequate circulation time permitted.  The carotid vessel controlled with  clamps.  A longitudinal arteriotomy was made in the distal common carotid  artery.  The arteriotomy extended across the  carotid bulb and up into the  internal carotid artery.  There was a fibrous focal plaque at the origin of  the right internal carotid artery with a severe stenosis greater than 80%.   A shunt was inserted.  Plaque then removed with an endarterectomy elevator.  Plaque raised down into the common carotid artery where it was divided  transversely with Potts scissors.  Plaque then raised up into the bulb where  the superior thyroid and external carotid were endarterectomized using an  eversion technique.  The internal carotid artery plaque feathered out  distally.  Fragments of plaque were removed with plaque forceps.  Site  irrigated with heparin saline solution.  A Finesse Dacron patch was then  placed over the endarterectomy site using running 6-0 Prolene suture.  The  shunt was then removed and all vessels well flushed.  Clamps were removed  directing initial antegrade flow up the external carotid artery, following  this, the internal carotid was released.   There was an  excellent pulse and Doppler signal in the distal internal  carotid artery.   Adequate hemostasis obtained.  The patient was administered 50 mg of  protamine intravenously.  The sternomastoid fascia was then closed with  running 2-0 Vicryl suture.  The platysma closed with running 3-0 Vicryl  suture.  Skin closed with 4-0 Monocryl.  Steri-Strips applied.  Sterile  dressing applied.   Anesthesia was reversed in the operating room.  The patient awakened  readily, moved all extremities to command.  Transferred to recovery room in  stable condition.                                                 Balinda Quails, M.D.    PGH/MEDQ  D:  11/02/2002  T:  11/02/2002  Job:  [id number]   cc:   Colleen Can. Deborah Chalk, M.D.  [address], [address]  [place]  [place] [address]  Fax: [phone]   Leanne Chang, M.D.  [address]  [place]  [place] [address]  Fax: [phone]

## 2011-05-09 NOTE — Op Note (Signed)
NAME:  Andrea [name], Andrea [name]                       ACCOUNT NO.:  [id number]   MEDICAL RECORD NO.:  [id number]                   PATIENT TYPE:  OIB   LOCATION:  2899                                 FACILITY:  [facility]   PHYSICIAN:  Quita Skye. Hart Rochester, M.D.               DATE OF BIRTH:  [birth year]   DATE OF PROCEDURE:  10/21/2002  DATE OF DISCHARGE:  10/21/2002                                 OPERATIVE REPORT   PREOPERATIVE DIAGNOSIS:  Severe right internal carotid artery stenosis,  moderate left internal carotid artery stenosis.   PROCEDURE:  1. Arch aortogram via right common femoral approach.  2. Bilateral selective carotid angiograms (biplane).   SURGEON:  Quita Skye. Hart Rochester, M.D.   ASSISTANT:  Balinda Quails, M.D.   ANESTHESIA:  Local Xylocaine.   COMPLICATIONS:  None.   DESCRIPTION OF PROCEDURE:  The patient was taken to the [facility] and placed in the supine position at which time  both groins were prepped with Betadine solution and draped in the routine  sterile manner.  After infiltration with 1% Xylocaine, the right common  femoral artery was entered percutaneously.  Guide wire passed into the  suprarenal aorta under fluoroscopic guidance. A 5 French sheath and dilator  were passed over the guide wire, dilator removed, and a standard pigtail  catheter positioned in the aortic arch.  An arch aortogram was then  performed injecting 40 cc of contrast at 20 cc per second. This revealed the  aortic arch to be widely patent with normal anatomy. The innominate artery  and proximal subclavian and common carotid arteries were widely patent on  the right. There was a large right vertebral which was patent.  The left  common carotid and left subclavian arteries were also widely patent with  antegrade flow in the left vertebral which was slightly smaller than the  right.  The pigtail catheter was then exchanged for an H1 Headhunter  catheter and selective  catheterization of the right common carotid artery  was obtained with biplane carotid angiograms performed with both  intracerebral and extracranial views, injecting 6 cc of contrast at 4 cc per  second for the extracranial views and 8 cc of contrast at 4 cc per second on  the intracranial views.  This revealed the right internal carotid artery to  have a severe stenosis at its origin approximating 90% with a widely patent  external carotid artery. The distal vessel and internal carotid artery had  rapid filling and was widely patent.  The extracranial views will be  interpreted by the radiology department.  The H1 catheter was then removed  over the guide wire and an S1 Simmons catheter used to cannulate the left  common carotid artery.  Selective left common carotid angiogram was then  performed with similar injections of 6 cc of contrast at 4 cc per second for  the extracranial views in  the AP and lateral projections.  This revealed the  left common carotid artery to be widely patent. There was a mild to moderate  stenosis at the origin of the left internal carotid artery approximating 40  to 50% which was smooth and nonulcerated.  The left external carotid artery  was widely patent.  The Simmons catheter was then removed over the guide  wire, guide wire removed, sheath removed, and adequate compression applied.  No complications ensued.  Following, Dr. Madilyn Fireman performed an abdominal  aortogram which will be dictated separately.    FINDINGS:  1. 90% stenosis origin of right internal carotid artery.  2. 40 to 50% stenosis origin left internal carotid artery.  3. Widely patent bilateral vertebral arteries with no other significant     stenoses noted.                                               Quita Skye Hart Rochester, M.D.    JDL/MEDQ  D:  10/26/2002  T:  10/26/2002  Job:  [id number]

## 2011-05-09 NOTE — Discharge Summary (Signed)
   NAME:  Andrea [name], Andrea [name]                       ACCOUNT NO.:  [id number]   MEDICAL RECORD NO.:  [id number]                   PATIENT TYPE:  OIB   LOCATION:  2002                                 FACILITY:  [facility]   PHYSICIAN:  Levin Erp. Steward, P.A.                DATE OF BIRTH:  [birth year]   DATE OF ADMISSION:  11/29/2002  DATE OF DISCHARGE:  12/02/2002                                 DISCHARGE SUMMARY   ADMISSION DIAGNOSIS:  Left lower extremity claudication.   DISCHARGE DIAGNOSIS:  Left lower extremity claudication.   HOSPITAL COURSE:  The patient was admitted to [facility] on November 29, 2002, after being seen and evaluated by Balinda Quails, M.D., as an outpatient for left lower extremity claudication.  On the  day of admission, the patient performed a right-to-left femorofemoral bypass  graft.  No complications noted during the procedure.  Postoperatively the  patient's hemoglobin and hematocrit remained stable at 12.6 and 37.4,  respectively.  The BUN and creatinine were 5 and 0.7, respectively.  Her  vascular graft was maintained throughout the entire postoperative course.  She had ankle brachial indexes performed postoperatively which revealed that  she had significantly increased blood flow to her left lower extremity.  She  was subsequently deemed stable for discharge home on postoperative day #3,  December 02, 2002.   DISCHARGE MEDICATIONS:  1. Aspirin 325 mg one daily.  2. Lipitor 20 mg one daily.  3. Nexium 40 mg one daily.  4. Paxil 30 mg one daily.  5. Ultram 50 mg one to two tablets every four to six hours as needed for     pain.   ACTIVITY:  The patient was told not to drive, perform any strenuous  activity, or lift heavy objects.   DISCHARGE DIET:  Low fat, low salt.   WOUND CARE:  The patient was told she could shower and clean her incision  with soap and water.  She is to have staples removed from her groin incision  at her office  visit with P. Liliane Bade, M.D.   DISPOSITION:  Home.   FOLLOW-UP:  The patient will see Balinda Quails, M.D., at the [facility] office on  Monday, December 12, 2002, at 2 p.m.                                               Levin Erp. Kirstie Peri.    BGS/MEDQ  D:  12/01/2002  T:  12/02/2002  Job:  [id number]

## 2011-05-09 NOTE — H&P (Signed)
NAME:  Andrea [name], Andrea [name]                       ACCOUNT NO.:  [id number]   MEDICAL RECORD NO.:  [id number]                   PATIENT TYPE:  INP   LOCATION:  NA                                   FACILITY:  [facility]   PHYSICIAN:  Balinda Quails, M.D.                 DATE OF BIRTH:  1966/11/08   DATE OF ADMISSION:  01/25/2004  DATE OF DISCHARGE:                                HISTORY & PHYSICAL   PRIMARY CARE PHYSICIAN:  Leanne Chang, M.D., [facility] in  [place].   CHIEF COMPLAINT:  Worsening bilateral lower extremity pain left greater than  right x4 to 5 months.   HISTORY OF PRESENT ILLNESS:  The patient is a 45 year old Caucasian female  with a known history of peripheral vascular occlusive disease and she has  undergone a right to left femoral to femoral bypass grafting surgery on  December of 2003 and right common iliac PTA and stenting in June of 2004  both by Dr. Madilyn Fireman.  Initially after these procedures, she had improvement of  her symptoms as evidenced by a decrease in her leg pain. She was doing well  for several months, however, in early October of last year, she began  developing claudication symptoms in her lower extremity predominantly on the  left leg.  Her has since progressed and she now reports rest pain symptoms  in the left foot.  She denies numbness and tingling, however.  She does  report mild bilateral feet swelling more so on the left than on the right,  which does improve somewhat when her legs are elevated.  She also reports  that both her legs tire very easily, although, she is able to maintain a  steady gait.  She reports pain is worse in her feet, but does progress up to  above her knee if she does not rest.  Based on these symptoms, she was  scheduled for an arteriogram on January 10, 2004, which did show mild to  moderate right femoral anastomotic stenosis and moderate to severe left  femoral anastomotic stenosis.  In addition, arteriogram showed mild  to  moderate left renal artery stenosis of less than 50%, a small caliber  infrarenal aorta without significant stenosis, patent right common iliac  stent without evidence of in-stent restenosis, and intact infrainguinal  runoff bilaterally.  Based on these findings and the patient's symptoms, Dr.  Madilyn Fireman did feel that surgical revision of her femoral to femoral bypass graft  would be the best treatment option.  After discussing the risks, benefits,  and alternatives with the patient, she did agree to proceed and this was  scheduled for January 25, 2004, at [facility]   PAST MEDICAL HISTORY:  1. Peripheral vascular occlusive disease, status post femoral to femoral     bypass grafting.  2. History of depression.  3. History of gastroesophageal reflux disease.  4. History of ECCVOD, status post  right carotid endarterectomy.  5. History of left shoulder dislocation in January of 2005.  6. Dyslipidemia.  7. History of walking pneumonia in October of 2003.  8. History of preeclampsia during her pregnancy, otherwise she denies any     history of hypertension.   PAST SURGICAL HISTORY:  1. Right to left femoral to femoral bypass using a 7 mm Hemashield Dacron     graft on November 29, 2002, by Dr. Madilyn Fireman.  2. Status post right common iliac artery PTA and stent on June 15, 2003, by     Dr. Madilyn Fireman.  3. History of right carotid endarterectomy in November of 2003, by Dr.     Madilyn Fireman.  4. History of laparoscopic cholecystectomy in August of 2003.  5. History of cesarean section and bilateral tubal ligation.   MEDICATIONS:  1. Lipitor 20 mg one p.o. daily.  2. Aspirin 325 mg one p.o. daily.  3. Vicodin 5/500 mg one to two tablets p.o. q.6h p.r.n. pain (she uses this     for pain management during the daytime as Vicodin keeps her up at night).  4. Naprosyn or Naproxen 550 mg one to two tablets p.o. daily p.r.n.  5. Darvocet-N 100 one p.o. q.h.s. p.r.n. pain.  6. Cyclobenzaprine 10 mg 1/2  to 1 tablets p.o. t.i.d. p.r.n. muscle spasms.   ALLERGIES:  CONTRAST DYE WHICH SHE HAS DEVELOPED HIVES, ITCHING, SWELLING,  AND DIFFICULTY BREATHING WHEN EXPOSED TO IN THE PAST.  SHE DOES REQUIRE  PREMEDICATION WITH PREDNISONE AND BENADRYL IF CONTRAST DYE USE IS  ANTICIPATED.   REVIEW OF SYSTEMS:  Please see history of present illness for significant  positives and negatives.  In addition, she specifically denies history of  diabetes mellitus, kidney disease, and asthma. She does wear glasses.   FAMILY HISTORY:  Her mother has a history of coronary artery disease and  underwent coronary artery bypass grafting at age 74.  She also has a history  of hypertension.  Her father also has a history of coronary artery disease  and hypertension.  Her maternal grandmother had a history of coronary artery  disease and arthritis.  Her maternal grandfather had a history of coronary  artery disease and tuberculosis and died in his 9's.   SOCIAL HISTORY:  She is married with one 14-year-old child.  She has worked  as a Emergency planning/management officer for 20 years.  She is currently on medical leave status  post a fall on January 04, 2004, which resulted in a left shoulder  dislocation. She denies alcohol use.  She does admit to smoking 1/2 packs of  cigarettes a day x20 years.  She was able to quit smoking in 2003, however,  she did resume smoking in August of 2004 when faced with more work-related  stress.  She does admit to hiding this from her family members and would not  like the fact that she has once again resumed smoking discussed with her  mother or husband.   PHYSICAL EXAMINATION:  VITAL SIGNS:  Blood pressure 126/80 in her left arm,  pulse 74, respirations 20.  GENERAL:  This is a 45 year old Caucasian female who was alert, cooperative,  and in no acute distress.  She was tearful during parts of this visit as she  expressed discouragement over her battle with smoking. HEENT:  Head is normocephalic and  atraumatic.  Sclerae are nonicteric.  Mucous membranes are pink and moist.  NECK:  Supple without jugular venous distention, thyromegaly, or  lymphadenopathy. She  does have 1+ carotid pulses with no bruits auscultated.  She does have a well-healed right neck incisional scar.  CHEST:  Her chest is symmetrical on inspiration.  Her lung sounds are clear.  No wheezes, rhonchi, or rales were noted.  HEART:  Regular rate and rhythm.  No murmurs, rubs, or gallops were  appreciated.  ABDOMEN:  Soft, nontender, and nondistended.  She does have normal active  bowel sounds x4. No masses or hepatosplenomegaly was noted.  GENITOURINARY:  RECTAL:  Deferred.  EXTREMITIES:  Warm.  She does have 2+ radial pulses bilaterally.  Her lower  extremities showed no edema or ulcerations.  She did have a 2+ femoral pulse  on the right.  Her left femoral pulses was barely palpable.  Her dorsalis  pedis pulses were nonpalpable on the left and barely palpable on the right.  Her posterior tibial pulse is barely palpable on the left and was 1+ on the  right.  NEUROLOGY:  Her neurologic examination was grossly intact.  She was alert  and oriented x4. Her speech was clear and her gait was steady.   IMPRESSION:  The patient is a 45 year old Caucasian female with known  history of peripheral vascular occlusive disease who now presents with  bilateral lower extremity claudication and rest pain in her left lower  extremity.  An arteriogram on January 10, 2004, showed mild to moderate  right femoral anastomotic stenosis and moderate to severe left femoral  anastomotic stenosis.   PLAN:  She will be electively admitted to [facility] on January 25, 2004, where she will undergo a revision of her right to left femoral to  femoral bypass graft by Balinda Quails, M.D.  Dr. Madilyn Fireman has seen and  evaluated this patient prior to this admission and has explained the risks  and benefits of the procedure and the patient has  agreed to proceed.      Jerold Coombe, P.A.                  Balinda Quails, M.D.    AWZ/MEDQ  D:  01/24/2004  T:  01/24/2004  Job:  [id number]   cc:   Leanne Chang, M.D.  [address]  [place]  [place] [address]  Fax: [phone]

## 2011-05-09 NOTE — Discharge Summary (Signed)
NAME:  Andrea [name], Andrea [name]                       ACCOUNT NO.:  [id number]   MEDICAL RECORD NO.:  [id number]                   PATIENT TYPE:  INP   LOCATION:  2005                                 FACILITY:  [facility]   PHYSICIAN:  Balinda Quails, M.D.                 DATE OF BIRTH:  1966/06/30   DATE OF ADMISSION:  01/25/2004  DATE OF DISCHARGE:  01/27/2004                                 DISCHARGE SUMMARY   ADMISSION DIAGNOSES:  Recurrent left lower extremity claudication.   DISCHARGE DIAGNOSES:  1. Recurrent peripheral vascular occlusive disease, status post revision of     a right-to-left femoral/femoral bypass with bilateral femoral patch     angioplasty.  2. Status post initial right-to-left femoral-femoral bypass completed on     November 29, 2002.  3. History of depression.  4. History of gastroesophageal reflux disease.  5. History of ECCBOD, status post right carotid endarterectomy in November,     2003.  6. History of left shoulder dislocation in January, 2005.  7. Dyslipidemia.  8. History of walking pneumonia in October, 2003.  9. History of preeclampsia during pregnancy, otherwise she denies any     history of hypertension.  10.      Status post laparoscopic cholecystectomy in August, 2003.   HOSPITAL MANAGEMENT/PROCEDURES:  1. A revision of right-to-left femoral-femoral bypass with bilateral femoral     patch angioplasty completed on January 25, 2004 by Dr. Madilyn Fireman.  2. Postoperative ABIs completed on January 26, 2004, which revealed an ABI     on the right of 0.89 and an ABI on the left of 0.96.   CONSULTATIONS:  None.   HISTORY OF PRESENT ILLNESS:  Andrea [name] is a 45 year old female with a  history of tobacco abuse and aortoiliac-occlusive disease.  Patient had an  occluded left iliac artery and had previously undergone a right-to-left  femoral-femoral bypass and developed recurrent claudication from this.  An  arteriogram was completed and revealed right common iliac  stenosis, for  which the patient underwent iliac angioplasty with stenting.  This stenting  was successful initially at relieving her symptoms; however, the patient  subsequently developed further symptoms of left lower extremity  claudication.  A repeat arteriography revealed a tight stenosis of the left-  sided femoral anastomosis of the fem-fem bypass graft that was completed in  December, 2003.  There was a moderate stenosis of the right femoral artery.  The patient was seen in the office by Dr. Madilyn Fireman, and he felt it was  appropriate for her to undergo a revision of the femoral-femoral bypass  graft.  The plan was for the patient to be admitted to [facility]  on February 2nd to undergone the above-mentioned procedure.  The risks,  benefits and alternatives of the procedure were discussed with the patient  prior to surgery, and she agreed to proceed.   HOSPITAL COURSE:  [name] was  admitted to [facility] on  January 24, 2004 and underwent a revision of the right-to-left femoral-  femoral bypass with angioplasty completed by Dr. Madilyn Fireman.  Patient tolerated  this well and was extubated on the operating room table without difficulty.  The patient was then transferred to the post-anesthesia care unit in stable  condition.  Once awake, alert, and appropriate, the patient was transferred  to the floor for further management.  The patient remained afebrile, and her  vital signs were stable throughout her operative day.   On postoperative day #1, the patient was doing quite well.  She was  ambulated in the hallways without difficulty.  She remained afebrile and  hemodynamically stable.  Her incisions were intact.  She did have  hypokalemia and was given supplemental potassium orally.  Otherwise,  postoperative ABIs were completed, which showed a marked improvement in the  left and a slight decrease in the right.  Bilateral Doppler wave forms were  within normal limits.   The patient was tolerating a regular diet.  She was  urinating without difficulty, and her bowels had moved.  She was deemed  appropriate for discharge planning then, for the following day, which would  be January 27, 2004.   We will continue as planned with discharge on January 27, 2004, pending a.m.  rounds and no change in the patient's clinical status.   DISCHARGE MEDICATIONS:  1. Aspirin 325 mg daily.  2. Lipitor 20 mg daily.  3. Vicodin 5/500 1-2 tabs every 6 hours as needed for pain.  4. Naproxen 550 mg 1-2 tablets daily as needed.  5. Darvocet-N 100 1 tablet every night as needed.  6. Cyclobenzaprine 10 mg 1/2 to 1 tablet 3 times daily as needed.   ALLERGIES:  IV CONTRAST gives the patient hives, itching, swelling, and  breathing difficulty.   DISCHARGE INSTRUCTIONS:  1. Activity:  Patient should avoid heavy lifting or strenuous activity.  She     should continue to walk daily.  2. Diet:  Patient should resume her preop diet, which should include low     fat, low cholesterol, well-balanced meals.  3. Wound care:  The patient may shower.  She should wash her incisions daily     with soap and water.  She should notify the CVTS office if she has any     increased redness, swelling, or drainage from her incisions.  4. Special instructions:  The patient is to return to the CVTS office for     staple removal, Thursday, February 10th at 9:30 a.m.   FOLLOW-UP APPOINTMENT:  1. Dr. Madilyn Fireman on Monday, February 28th at 9:30 a.m. with repeat ABIs.  2. Dr. Blossom Hoops within 2-4 weeks of discharge.  The patient should call to     schedule this appointment date and time.      Carolyn A. Arlean Hopping, M.D.    CAF/MEDQ  D:  01/26/2004  T:  01/27/2004  Job:  [id number]   cc:   Leanne Chang, M.D.  [address]  [place]  [place] [address]  Fax: [phone]

## 2011-05-09 NOTE — Discharge Summary (Signed)
NAME:  Andrea [name], Andrea [name]                       ACCOUNT NO.:  [id number]   MEDICAL RECORD NO.:  [id number]                   PATIENT TYPE:  INP   LOCATION:  6525                                 FACILITY:  [facility]   PHYSICIAN:  Stephanie Swaziland, NP                DATE OF BIRTH:  [birth year]   DATE OF ADMISSION:  10/13/2002  DATE OF DISCHARGE:  10/15/2002                                 DISCHARGE SUMMARY   PRIMARY CARE PHYSICIAN:  Leanne Chang, M.D.   DISCHARGE DIAGNOSES:  1. Atypical chest pain.  Cardiac enzymes negative.  Electrocardiogram     negative.  Stress Cardiolite negative.  2. Multivessel atherosclerotic disease with bilateral carotid stenosis.  3. Left ileofemoral disease.  4. Dyslipidemia.  5. Tobacco abuse.  6. Gastroesophageal reflux disease.  7. Bilateral hearing loss.  8. Depression.  9. Recurrent sinusitis/bronchitis.  10.      Status post cholecystectomy.  11.      Cesarean section 1999.  12.      Tubal ligation, 1999.   DISCHARGE MEDICATIONS:  1. Aggrenox 1 tablet b.i.d.  2. Paxil 30 mg daily.  3. Protonix 40 mg daily.  4. Lipitor 20 mg daily.   CONSULTATIONS:  1. Cassell Clement, MD, Cardiology.  2. Balinda Quails, M.D., CVTS.   PROCEDURES/ STUDIES:  Electrocardiogram October 13, 2002 normal sinus  rhythm.  Bilateral carotid ultrasound October 14, 2002 impression of right  80% internal carotid artery stenosis, multilevel, appears normal in distal  internal carotid artery, turbulent flow noted throughout internal carotid  artery.  Left with 60 to 80% internal carotid artery stenosis, appears  normal in distal internal carotid artery with turbulent flow.  Bilateral lower extremity ankle brachial indexes on October 14, 2002  revealed right within normal limits, left with moderate decrease in flow.  Right 0.97, left 0.66.  Stress Cardiolite negative for ischemia.  Ejection  fraction 62%.   LABORATORY DATA:  Cardiac enzymes as follows:  CK's 55, 57, 58.  CK-MB's 1.2,  1.1, 1.1. Troponin levels 0.02, 0.02, 0.02.  Lipid panel total cholesterol  237, triglycerides 515, HDL 31.  C-Reactive protein 7.530. Homocysteine  9.38.  White blood cell count 9.3, red blood cells 4.51, hemoglobin 13.6,  hematocrit 40.3.  Sodium 135, potassium 3.6, chloride 104, cO2 24, BUN 9,  creatinine 0.6, SGOT 19, SGPT 22, calcium 8.9.   DISPOSITION:  Patient will be discharged home.   HISTORY OF PRESENT ILLNESS:  This is a 45 year old female with history of  tobacco abuse who presented to [facility] on October 13, 2002 with chest pain radiating to the right shoulder.  The patient reported  the pain to be substernal and pressure-like in nature.  She reported onset  of chest pain at approximately 7:20 on the day of admission.  There was no  associated shortness of breath, nausea, vomiting.  The patient went to her  primary  care physician, Dr. Leanne Chang where her vital signs were  stable.  She was given a sublingual nitroglycerin with some relief and was  sent to [facility] emergency room.  Initially in the  emergency room she continued to have some chest discomfort.  At  approximately 4 o'clock p.m. she was pain free.  Initial electrocardiogram  done at her primary care physician's office and in the emergency room were  both without ST changes and normal sinus rhythm.  Due to the patient's  strong family history, (her mother having her first coronary artery bypass  at the age of 4) and other risk factors, the patient was admitted for rule  out myocardial infarction.   HOSPITAL COURSE:  #1 - ATYPICAL CHEST PAIN:  Again, the patient, at about 4  o'clock p.m. on the day of admission was pain free.  She continued to be  pain free.  Serial cardiac enzymes when she was placed on telemetry obtained  with results as above.  A cardiology consultation was obtained.  The patient  underwent a stress Cardiolite study which revealed no  ischemia, ejection  fraction 62%.  Due to her family history other risk factors were  investigated.  She did have an elevated C-reactive protein.  Lipid panel was  obtained and she was started on Lipitor.  The patient remained pain free  with no ectopy on cardiac monitor.   #2 - MULTIVESSEL ATHEROSCLEROSIS:  Bilateral carotid artery disease.  During  examination the patient was noted to have a right carotid bruit. Due to her  family history bilateral carotid ultrasounds were performed with results as  above. A CVTS consultation was obtained. She was seen by Dr. Liliane Bade on  October 14, 2002 with recommendations for elective follow up as an  outpatient.  Patient will be discharged on Aggrenox.   #3 - LEFT ILEOFEMORAL DISEASE:  The patient did report a three month history  of left hip pain with walking.  Again, due to her history ABI's were  obtained which did reveal decreased flow on the left.  This was addressed by  Dr. Madilyn Fireman with recommendations for elective follow up as an outpatient.   #4 - DYSLIPIDEMIA:  The patient's cholesterol was elevated along with a low  HDL.  Lipitor was started at 20 mg daily.  Further titration and monitoring  will be left to her primary care physician.   #5 - TOBACCO CESSATION:  The patient was strongly advised several times  about the importance of smoking cessation due to her risk factors and family  history.   #6 - DEPRESSION:  The patient will be discharged on home medication, Paxil  30 mg daily.  She was tearful when questioned about her home life, further  investigation will be left to her primary care physician.   #7 - GASTROESOPHAGEAL REFLUX DISEASE:  The patient will be discharged on  Protonix 40 mg daily.    FOLLOW UP AND INSTRUCTIONS:  Patient should have a one week appointment with  her primary care physician Dr. Leanne Chang for follow up.  She is to follow up with Dr. Madilyn Fireman as an outpatient.                                                Stephanie Swaziland, NP    SJ/MEDQ  D:  10/15/2002  T:  10/17/2002  Job:  [id number]   cc:   Leanne Chang, M.D.  [address]  [place]  [place] [address]  Fax: [phone]   Cassell Clement, MD   P. Liliane Bade, M.D.  [address]  [place]  [place] [address]  Fax: [phone]

## 2011-05-09 NOTE — H&P (Signed)
NAME:  Andrea [name], Andrea [name]                       ACCOUNT NO.:  [id number]   MEDICAL RECORD NO.:  [id number]                   PATIENT TYPE:  INP   LOCATION:  1844                                 FACILITY:  [facility]   PHYSICIAN:  Deirdre Peer. Polite, M.D.              DATE OF BIRTH:  Jul 13, 1966   DATE OF ADMISSION:  10/13/2002  DATE OF DISCHARGE:                                HISTORY & PHYSICAL   PRIMARY CARE PHYSICIAN:  Leanne Chang, M.D.   CHIEF COMPLAINT:  Chest pain radiating to the right shoulder.   HISTORY OF PRESENT ILLNESS:  This is a 45 year old female who reports the  onset of substernal chest pain at approximately 7:20 a.m.  The patient  reported that the pain radiated to the right shoulder. She describes the  pain as being pressure like with no associated shortness of breath, nausea  or vomiting. She went to her primary care physician, Dr. Blossom Hoops, where  vital signs were stable. She was given one sublingual nitroglycerin with  some relief and sent to [facility] emergency room.   In the emergency room she continued to complain of some chest discomfort. At  approximately  4 p.m. she was  pain free. An EKG was done and revealed  sinus rhythm with no ST changes. Because of the patient's strong family  history of coronary artery disease and risk factors including heavy tobacco  abuse and  obesity, she is being admitted for rule out MI.   PAST MEDICAL HISTORY:  1. Borderline hypertension.  2. Bilateral hearing loss.  3. Gastroesophageal reflux disease.  4. Depression.  5. Recurrent sinusitis, bronchitis.  6. Status post cholecystectomy.  7. Cesarean section in 1999.  8. Tubal ligation in 1999.   PAST CONSULTS:  None.   ALLERGIES:  No known drug allergies.   IMMUNIZATIONS:  Last tetanus was greater than 10 years ago. She had a flu  shot last November.   CURRENT MEDICATIONS:  1. Paxil 30 mg q.d.  2. Nexium 40 mg which she has not taken lately.   FAMILY  HISTORY:  Her mother is alive at the age of 76. She has a history of  three vessel CABG x 2 with her first MI at the age of 34, and was in the  hospital approximately two weeks ago for a rule out MI. She also has  hypertension. Her father is  alive at the age of 23. He had his first MI at  the age of 68 to 53. He also has hypertension. She has one brother and one  sister who are alive and well with no known medical problems.   SOCIAL HISTORY:  She is married. She has a 33-year-old son. 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 The patient  reports working six days a week. Her home life is stressful. She smokes  approximately one pack  per day with occasional alcohol use, no illicit drug  use.   REVIEW OF SYSTEMS:  GENERAL:  She has had no recent significant weight  changes, no fever or chills. HEAD: No dizziness, no headaches. EYES: She  wears prescription lenses, no diplopia. EARS:  She has bilateral hearing  loss. NOSE:  No recent nasal drainage. She does have a history of frequent  sinusitis.  THROAT:  No dysphagia. She has most of her adult teeth in fair  condition. LUNGS:  No shortness of breath, no wheezing, no cough.  CARDIOVASCULAR:  Positive for substernal chest pain radiating  to  her right  shoulder. No palpitations. GI:  No nausea or vomiting, no constipation or  diarrhea.  Positive for GERD.  GU:  No dysuria. Last Pap smear in 2002 was  normal. BREASTS:  She had a normal mammogram in 2003. MUSCULOSKELETAL:  She  has occasional left hip pain with walking, occasional swelling of her hands.  ENDOCRINE:  Reports on thyroid problems, no diabetes mellitus. HEME AND  LYMPH:  No anemia, no allergies.  NEUROLOGICAL: No history of seizures.   PHYSICAL EXAMINATION:  VITAL SIGNS:  Temperature 98.1, blood pressure  147/81, pulse 74, respirations 18, O2 saturation 100% on room air.  GENERAL:  A slightly obese female in no acute distress.  HEENT:   Normocephalic, atraumatic.  Pupils equally round and reactive to  light and accomodation.  Extraocular movements intact. Sclerae and  conjunctivae are  clear. Nasal mucosa without drainage. Oropharynx without  lesions.  NECK:  Supple, no thyromegaly, no lymphadenopathy, no bruits.  LUNGS:  Clear to auscultation bilaterally.  CARDIOVASCULAR:  Regular rate and rhythm, no murmurs, rubs, gallops.  CHEST:  With no reproducible chest wall pain.  BREASTS:  Deferred.  ABDOMEN:  Obese, soft, positive bowel sounds, nontender.  GENITOURINARY:  Normal external female genitalia,  bimanual exam was  deferred.  RECTAL:  Exam was deferred.  EXTREMITIES:  She  has trace lower extremity edema. No clubbing or cyanosis.  MUSCULOSKELETAL:  She has full range of motion, active and passive with mild  discomfort over her right shoulder. She has full range of motion of her  cervical spine without pain.  SKIN:  No suspicious lesions.  PSYCHIATRIC:  The patient is tearful when questioned about depression and  home life.  NEUROLOGIC:  She is alert and oriented x 3. Cranial nerves II through XII  grossly intact. Deep tendon reflexes 2+ upper and lower. Strength is 5/5  upper and lower extremities. Speech is clear.   LABORATORY DATA:  WBC count 9.3,  hemoglobin 13.6, hematocrit 40.3,  platelets 255. Sodium 135, potassium 3.6, chloride 104, CO2 24, BUN 9,  creatinine 0.6, glucose 96. CK 55.   An EKG with normal sinus rhythm, ventricular rate 65, no ST changes. A chest  x-ray with a questionable right lower lung zone nodule with a recommendation  for repeat with nipple markers.   ASSESSMENT AND PLAN:  1. Substernal chest pain, radiating to the right arm. The patient will be     admitted to telemetry to obtain serial cardiac enzymes with troponin. A     lipid panel will be obtained. A cardiology consult has been made. 2. Right shoulder pain. No neurological or musculoskeletal deficit noted.     Treat pain with  mild analgesics.  3. Hypertension. Will start her on low dose diuretic, hydrochlorothiazide at     25 mg due to the patient's evidence of fluid retention.  4. Gastroesophageal  reflux disease.  Will restart proton pump inhibitor     daily.  5.     Depression.  Continue Paxil at 30 mg q.d.  6. Tobacco abuse. The patient has been counseled on the importance of     smoking cessation.  7. Questionable right lower lung zone nodule. Repeat chest x-ray with nipple     markers will be performed.     Stephanie Swaziland, NP                      Deirdre Peer. Polite, M.D.    SJ/MEDQ  D:  10/13/2002  T:  10/13/2002  Job:  [id number]   cc:   Leanne Chang, M.D.  [address]  [place]  [place] [address]  Fax: [phone]

## 2011-05-09 NOTE — Discharge Summary (Signed)
   NAME:  Andrea [name], Andrea [name]                         ACCOUNT NO.:  [id number]   MEDICAL RECORD NO.:  [id number]                   PATIENT TYPE:   LOCATION:                                       FACILITY:   PHYSICIAN:  Levin Erp. Steward, P.A.                DATE OF BIRTH:  Nov 24, 1966   DATE OF ADMISSION:  11/01/2002  DATE OF DISCHARGE:  11/02/2002                                 DISCHARGE SUMMARY   DATE OF BIRTH:  03-01-1966.   ADMISSION DIAGNOSES:  Right internal carotid artery stenosis.   DISCHARGE DATE:  Right internal carotid artery stenosis.   HOSPITAL COURSE:  [name] was admitted to [facility] on  November 01, 2002. She was previously seen and evaluated by Dr. Balinda Quails as an outpatient for a right internal carotid artery stenosis. He felt  surgical correction was mandated at this time. On the day of admission, the  patient underwent a right carotid endarterectomy with Dacron patch  angioplasty. No complications were noted during the procedure.  Postoperatively the patient was found to be neurologically intact. There  were no complications during her postoperative stay. Hemoglobin and  hematocrit stabilized at 12.2 and 35.7 with a BUN and creatinine of 4 and  0.5. Adequate pain control was achieved and the patient was discharged to  home on postoperative day one.   DISCHARGE MEDICATIONS:  1. Tylox 1-2 tabs every four to six hours as needed pain.  2. Paxil 30 mg one daily.  3. Aspirin 325 mg one daily.  4. Nexium 40 mg one daily.  5. Lipitor 20 mg one daily.   ACTIVITY:  The patient was told no driving or strenuous activity. She was  told not to lift heavy objects.   DIET:  Low fat and low salt.   WOUND CARE:  The patient was told that she could shower and clean her  incisions with soap and water.   DISPOSITION:  To home.    FOLLOW UP:  The patient will see Dr. Madilyn Fireman in two weeks at the CVTS office.  Will call to verify the time and date of this  appointment.                                               Levin Erp. Kirstie Peri.    BGS/MEDQ  D:  11/02/2002  T:  11/02/2002  Job:  [id number]   cc:   Balinda Quails, M.D.  [address]  [place]  [place] [address]  Fax: [phone]

## 2011-05-09 NOTE — Op Note (Signed)
NAME:  Andrea [name], Andrea [name]                       ACCOUNT NO.:  [id number]   MEDICAL RECORD NO.:  [id number]                   PATIENT TYPE:  OIB   LOCATION:  NA                                   FACILITY:  [facility]   PHYSICIAN:  Balinda Quails, M.D.                 DATE OF BIRTH:  [birth year]   DATE OF PROCEDURE:  01/10/2004  DATE OF DISCHARGE:                                 OPERATIVE REPORT   PREOPERATIVE DIAGNOSIS:  Recurrent left lower extremity claudication.   PROCEDURE PERFORMED:  1. Abdominal aortogram with bilateral lower extremity runoff arteriography.  2. Pressure gradient measurement, right common iliac artery.   ACCESS:  Right common femoral artery 5 French sheath.   CONTRAST:  130 mL Visipaque.   COMPLICATIONS:  None apparent.   CLINICAL NOTE:  Andrea [name] is a 45 year old female with a history of  hyperlipidemia and peripheral vascular disease.  She was a tobacco user in  the past.  She has a left iliac occlusion and a right to left femoral-  femoral bypass graft.  She has previously undergone intervention with right  common iliac artery stenting.  She has developed recurrent claudication in  her left lower extremity.  She was brought back to the cath lab at this time  for diagnostic arteriography.   DESCRIPTION OF PROCEDURE:  The patient was brought to the cath lab in stable  condition.  Informed consent obtained.  Placed in supine position and both  groins prepped and draped in sterile fashion.  Skin and subcutaneous tissue  in the right groin instilled with 1% Xylocaine.  The patient received 2 mg  of Versed, 15 mcg of fentanyl intravenously.  A needle was easily introduced  to the right common femoral artery.  0.035 Rosen guidewire advanced through  the needle into the midabdominal aorta under fluoroscopy.  Needle removed  and a 5 French sheath advanced over the guidewire.  The dilator removed and  sheath flushed with heparin saline solution.   A standard  pigtail catheter was then advanced over the guidewire to the  suprarenal aorta.  Standard AP and mid abdominal aortogram obtained.  This  revealed single renal arteries bilaterally.  There was a moderate stenosis  to the origin of the left renal artery with some kinking.  This was  estimated to be 50%.  The right renal artery was widely patent.  The  infrarenal aorta was moderately small in caliber but no dominant stenosis.  Large lumbar arterial vessels were noted.  The right common iliac artery  revealed a mild origin stenosis and patent stent in the proximal right  common iliac artery without evidence of in-stent restenosis.  The right  external iliac artery appeared widely patent. The left common iliac artery  was occluded.   Pigtail catheter brought down to the aortic bifurcation.  AP and bilateral  oblique pelvic arteriograms were obtained. The right common iliac system  appeared widely patent.  The right external iliac artery also appeared  widely patent.  The right internal iliac artery revealed no stenosis.  The  femoral-femoral graft was patent.  There was a mild stenosis at the right  femoral anastomosis in the femoral-femoral graft.  The left femoral  anastomosis revealed a more severe stenosis.  This was estimated to be  greater than 70%.  The common femoral arteries were patent bilaterally.   Lower extremity runoff arteriography was obtained.  The common femoral,  profunda and superficial femoral arteries were patent bilaterally.  Intact  popliteal artery provided normal flow to the tibial vessels without evidence  of major tibial arterial disease.  Oblique views of the femoral anastomoses  were obtained.  This did verify a moderate to severe stenosis of the left  femoral anastomosis.  It appeared to be a mild to moderate stenosis of the  right femoral anastomosis.   200 mcg of nitroglycerin was then injected into the right femoral sheath.  An exchange was made for an end  hole catheter.  A pressure gradient was then  measured across the right iliac system without evidence of significant  stenosis.  This completed the arteriogram procedure.  The catheter,  guidewires and sheaths were removed.  There were no apparent complications.  The patient was transferred to the holding area in stable condition.   FINAL IMPRESSION:  1. Mild to moderate left renal artery stenosis, estimated to be less than     50%.  2. Small caliber infrarenal aorta without significant stenosis.  3. Patent right common iliac stent without evidence of in-stent restenosis.  4. Mild to moderate right femoral anastomotic stenosis.  5. Moderate to severe left femoral anastomotic stenosis.  6. Intact infrainguinal runoff bilaterally.   DISPOSITION:  These results have been discussed with the patient and family.  It was recommended that the patient undergo revision of the femoral-femoral  bypass with repair of the left femoral stenosis and possible repair of right  femoral stenosis.                                               Balinda Quails, M.D.    PGH/MEDQ  D:  01/10/2004  T:  01/10/2004  Job:  [id number]   cc:   Leanne Chang, M.D.  [address]  [place]  [place] [address]  Fax: [phone]

## 2011-05-09 NOTE — Op Note (Signed)
   NAME:  SHONTERIA, ABELN                       ACCOUNT NO.:  [id number]   MEDICAL RECORD NO.:  [id number]                   PATIENT TYPE:  OIB   LOCATION:  2899                                 FACILITY:  [facility]   PHYSICIAN:  Davonna Belling, M.D.                   DATE OF BIRTH:  [birth year]   DATE OF PROCEDURE:  DATE OF DISCHARGE:  10/21/2002                                 OPERATIVE REPORT   PROCEDURES PERFORMED:  Cerebral angiography.   The study was performed by Dr. Hart Rochester and Dr. Madilyn Fireman.   Injection of the right common carotid artery with filming over the head  demonstrated poor filling of the right anterior cerebral artery.  The  majority of the flow entered the right middle cerebral artery.  There are no  stenoses of the visualized segments.  No aneurysms were seen.   The left common carotid artery injection with filming over the head  demonstrated filling of both anterior cerebral arteries via left to right  flow as well as reflux into a short segment of the right middle cerebral  artery indicating cross fill.  No intracranial aneurysms were seen.   IMPRESSION:  1. Cross fill from left to right via the anterior communicating artery in     this patient with carotid artery disease at the right common carotid     artery bifurcation.  2. No intracranial stenoses or aneurysms can be identified.                                               Davonna Belling, M.D.    JC/MEDQ  D:  12/14/2002  T:  12/15/2002  Job:  [id number]

## 2011-05-09 NOTE — Cardiovascular Report (Signed)
NAME:  Andrea [name], Andrea [name]                       ACCOUNT NO.:  [id number]   MEDICAL RECORD NO.:  [id number]                   PATIENT TYPE:  INP   LOCATION:  NA                                   FACILITY:  [facility]   PHYSICIAN:  Balinda Quails, M.D.                 DATE OF BIRTH:  [birth year]   DATE OF PROCEDURE:  10/21/2002  DATE OF DISCHARGE:                              CARDIAC CATHETERIZATION   PROCEDURE:  Mid abdominal aortogram with bilateral lower extremity runoff  arteriography.   CLINICAL NOTE:  The patient is a 45 year old female with left lower  extremity claudication.  She is brought to the catheterization lab at this  time for diagnostic arteriography with aortogram, bilateral extremity runoff  arteriography.  She had nonconcomitant cerebral arteriography with arch  aortogram and bilateral selective carotid arteriograms dictated under  separate heading.   PROCEDURE NOTE:  The patient was brought to the catheterization lab in  stable condition.  Placed in the supine position.  Right groin prepped and  draped in a sterile fashion.  Skin and subcutaneous tissue instilled with 1%  Xylocaine.  A needle was easily introduced into the right common femoral  artery.  A 0.035 J wire passed through the needle into the mid abdominal  aorta.  A #5 French sheath was then advanced over the guidewire and the  sheath flushed with heparin and saline solution.  A standard pigtail  catheter was advanced over the guidewire to the mid abdominal aorta.  Standard AP mid abdominal aortogram obtained.  Single bilateral widely  patent renal arteries are present.  The infrarenal aorta revealed mild  atherosclerotic disease but no dominant stenosis.  The right common iliac  artery was widely patent.  The left common iliac artery was occluded at its  origin.  The right common iliac artery provided continuous flow to the  internal/external iliac arteries.  The left common iliac artery was occluded  at  its origin.  Common collaterals reconstituted the distal left common  iliac artery as the internal/external iliac arteries were patent on the  left.   Lower extremity runoff arteriography was obtained.  This revealed the common  femoral, superficial femoral, profunda femoris, popliteal, and tibial  vessels to be patent bilaterally.   This completed the arteriogram procedure.  The patient tolerated the  procedure well.  The guidewire was reinserted and the pigtail catheter and  guidewire were removed.   The patient was transferred to the holding area in stable condition for  removal of the right femoral sheath.   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   DISPOSITION:  This patient has a severe right internal carotid artery  stenosis along with left common iliac artery occlusion.  She will undergo  right carotid endarterectomy as initial procedure and then probable right-to-  left femoral-femoral bypass for relief of left lower extremity claudication.  Balinda Quails, M.D.   PGH/MEDQ  D:  10/21/2002  T:  10/21/2002  Job:  [id number]   cc:   Peripheral Vascular Catheterization Lab

## 2011-05-09 NOTE — Op Note (Signed)
NAME:  Andrea [name], Andrea [name]                       ACCOUNT NO.:  [id number]   MEDICAL RECORD NO.:  [id number]                   PATIENT TYPE:  OIB   LOCATION:  2860                                 FACILITY:  [facility]   PHYSICIAN:  Balinda Quails, M.D.                 DATE OF BIRTH:  Apr 11, 1966   DATE OF PROCEDURE:  DATE OF DISCHARGE:                                 OPERATIVE REPORT   PREOPERATIVE DIAGNOSIS:  Claudication, left lower extremity.   POSTOPERATIVE DIAGNOSIS:  Claudication, left lower extremity.   PROCEDURE:  Right to left femoral-femoral bypass with 7 mm Hemashield Dacron  graft.   SURGEON:  Balinda Quails, M.D.   ASSISTANT:  Nurse.   ANESTHESIA:  General endotracheal anesthesia, anesthesiologist Quita Skye.  Krista Blue, M.D.   INDICATIONS:  This is a 45 year old female with a history of peripheral  vascular disease. She has a left common iliac occlusion. She has limiting  claudication of the left lower extremity. She is brought to the operating  room at this time for a right to left femoral-femoral bypass.   DESCRIPTION OF PROCEDURE:  The patient was brought to the operating room in  stable condition and placed in the supine position. General endotracheal  anesthesia was induced. The abdomen and both legs were prepped and draped in  a sterile fashion.   A longitudinal skin incision was made over the right femoral pulse at the  inguinal ligament. Dissection was carried down through the subcutaneous  tissue with adequate electrocautery. The right common femoral artery was  identified to the inguinal ligament and mobilized and encircled with a  vessel loop.   The common femoral artery dissected distally down to the origin of the  profunda and superficial femoral arteries which were also circled with  vessel loops. There was a strong pulse in the right common femoral artery;  moderate posterior plaque.   A second longitudinal skin incision was made in the left groin.  Dissection  was carried down to expose the common femoral artery at the inguinal  ligament. This was mobilized and encircled with a vessel loop. There were no  palpable pulses although the artery was quite soft. Distal dissection was  carried down to mobilize the common femoral artery to the origin of the  profunda  and superficial femoral, which were also encircled with vessel  loops.   A subcutaneous suprapubic tunnel was created. Tunneling was difficult due to  a history of cesarean section. There were a number of adhesions which had to  be freed from the subcutaneous tissue. The tunnel was created  satisfactorily, however. A 7 mm Dacron graft was placed through he tunnel.  The patient was  administered 5000 units of heparin intravenously.   The right femoral vessel was controlled with clamps. A longitudinal  arteriotomy was made in the right common femoral artery. The Dacron graft  was beveled and anastomosed end-to-side to  the right common femoral artery  with running 6-0 Prolene suture.  The graft was then flushed and the right  femoral clamps were removed. The graft was controlled with a fistula clamp.   The left femoral vessel was then controlled with clamps. A longitudinally  arteriotomy was made in the left common femoral artery. The graft was  beveled and anastomosed end-to-side to the left common femoral artery with  running 6-0 Prolene suture. The clamps were then removed after adequate  flushing. Excellent flow was present to the graft. Adequate hemostasis was  obtained. Sponge and instrument counts were correct.   The groin wound was irrigated with antibiotic solution. The patient was  administered 50 mg of Protamine intravenously. The groin wound was closed  bilaterally with 2 layers of running 2-0 Vicryl suture and staples were  applied to the skin. A sterile dressing  was applied. The patient was  transferred to the recovery room in stable condition.                                                Balinda Quails, M.D.    PGH/MEDQ  D:  11/29/2002  T:  11/29/2002  Job:  [id number]   cc:   Peripheral Vascular Catheter Laboratory   Leanne Chang, M.D.  [address]  [place]  [place] [address]  Fax: [phone]

## 2011-05-09 NOTE — H&P (Signed)
NAME:  Andrea [name], Andrea [name]                       ACCOUNT NO.:  [id number]   MEDICAL RECORD NO.:  [id number]                   PATIENT TYPE:  INP   LOCATION:  NA                                   FACILITY:  [facility]   PHYSICIAN:  Balinda Quails, M.D.                 DATE OF BIRTH:  03-27-1966   DATE OF ADMISSION:  11/01/2202  DATE OF DISCHARGE:                                HISTORY & PHYSICAL   PRIMARY CARE PHYSICIAN:  Leanne Chang, M.D. and Deirdre Peer. Polite, M.D.   HISTORY OF PRESENT ILLNESS:  The patient is a 45 year old female referred by  Dr. Blossom Hoops for evaluation of carotid artery disease. She presented in the  emergency room in late October of this year with complaint of right-sided  chest pain radiating to her right arm.  Because of a strong family history,  a cardiac workup was initiated.  There were no significant cardiac findings,  but both severe extracranial cerebrovascular occlusive disease and left  lower extremity occlusive disease were both diagnosed.  She underwent  aortogram on October 31 and now presents for right carotid endarterectomy.  Carotid duplex ultrasounds demonstrated a greater than 80% right internal  carotid artery stenosis and a 60 to 80% left internal carotid artery  stenosis.   REVIEW OF SYSTEMS:  The patient has been entirely asymptomatic  neurologically. She has no recent weight loss or gain, no fevers or chills.  She has no history of vertigo, dizziness, seizures, paresthesias.  No  unilateral muscle weakness, no dysphagia, no memory loss, no confusion, no  amaurosis fugax.  She does have a chronic cough secondary to tobacco  habituation,and she has had claudication symptoms in the left lower  extremity for the past five years.   PAST MEDICAL HISTORY:  1. Extracranial cerebrovascular occlusive disease.  2. Strong family history of atherosclerotic coronary artery disease.  3. Depression.  4. GERD.  5. Borderline hypertension.  6.  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  7. Recurrent sinusitis and bronchitis.   PAST SURGICAL HISTORY:  1. Status post cholecystectomy in 2003.  2. Status post C section in April 1997.  3. Status post tubal ligation August 1999.   MEDICATIONS:  1. Paxil 30 mg daily.  2. Aspirin 325 mg daily.  3. Nexium 40 mg daily.  4. Lipitor 20 mg daily at bedtime.  5. Aggrenox 1 tablet twice daily on hold since November 4.   ALLERGIES:  No known drug allergies.   The patient denies any prior history of myocardial infarction or diabetes.   SOCIAL HISTORY:  The patient is married.  She has one child 25 years old  who is healthy. She works currently as a Astronomer at [employer].  She does not partake of alcoholic beverages.  She smokes cigarettes, one pack per day.   FAMILY HISTORY:  Mother is living at age 65 with a history of coronary  artery disease status post coronary artery bypass graft surgery.  She also  has infrainguinal arterial occlusive disease and hypertension and has had  prior carotid endarterectomy x 2.  Father is living at age 58, has a history  of myocardial infarction and hypertension. She has one brother and one  sister who are both living and well.   PHYSICAL EXAMINATION:  GENERAL:  Alert and oriented female in no acute  distress with no neurologic deficits.  VITAL SIGNS:  Blood pressure 114/72 in left upper extremity.  Pulse is  regular at 84.  HEENT:  Normocephalic and atraumatic.  Eyes: Pupils are equal, round, and  reactive to light.  Extraocular movements intact.  She does not wear  dentures.  NECK:  Supple.  There is a soft right carotid bruit, no bruit on the left.  LUNGS:  Clear to auscultation and percussion bilaterally.  HEART:  Regular rate and rhythm without murmur, rub, or gallop.  ABDOMEN:  Soft, nondistended, nontender.  Bowel sounds are present. No  masses on  palpation.  EXTREMITIES:  No evidence of clubbing or cyanosis.  There is a palpable  right dorsalis pedis pulse with an ankle brachial index of 0.97 on the  right.  Absent left dorsalis pedis pulse and ankle brachial index of 0.66 on  the left. She has 4/4 femoral pulses bilaterally.  There is a slight  induration in the right groin secondary to arteriogram performed October 31.  It is not pulsatile.  NEUROLOGIC:  There are no neurologic deficits.  Gait is steady.  Muscle  strength 5/5 bilaterally. Deep tendon reflexes 2+ at the patella regions  bilaterally.   IMPRESSION:  1. Extracranial cerebrovascular occlusive disease.  2. Right internal carotid artery stenosis of greater than 80%, asymptomatic.   PLAN:  Right carotid endarterectomy 11/01/2002, [facility]man George.     Maple Mirza, P.A.                    Balinda Quails, M.D.    GM/MEDQ  D:  10/31/2002  T:  10/31/2002  Job:  [id number]   cc:   Leanne Chang, M.D.  [address]  [place]  [place] [address]  Fax: [phone]   Deirdre Peer. Polite, M.D.  [address]  [place], [place] [address]  Fax: [phone]

## 2011-05-09 NOTE — Op Note (Signed)
NAME:  Andrea [name], Andrea [name]                       ACCOUNT NO.:  [id number]   MEDICAL RECORD NO.:  [id number]                   PATIENT TYPE:  INP   LOCATION:  3313                                 FACILITY:  [facility]   PHYSICIAN:  Balinda Quails, M.D.                 DATE OF BIRTH:  1966-02-28   DATE OF PROCEDURE:  01/25/2004  DATE OF DISCHARGE:                                 OPERATIVE REPORT   SURGEON:  Balinda Quails, M.D.   ASSISTANT:  Coral Ceo, P.A.   ANESTHESIA:  General endotracheal anesthesia.   ANESTHESIOLOGIST:  Judie Petit, M.D.   PREOPERATIVE DIAGNOSIS:  Recurrent left lower extremity claudication.   POSTOPERATIVE DIAGNOSIS:  Recurrent left lower extremity claudication.   PROCEDURE:  Revision of right to left femoral/femoral bypass with bilateral  femoral patch angioplasty.   CLINICAL NOTE:  Andrea [name] is a 45 year old female with a history of  tobacco abuse and aortoiliac occlusive disease.  She has an occluded left  iliac artery.  She has previously undergone a right to left femoral/femoral  bypass and developed recurrent claudication.  Arteriography revealed right  common iliac stenosis and she underwent iliac angioplasty with stenting.  This was successful initially at relieving her symptoms, however, she  subsequently developed further symptoms of left lower extremity  claudication.  Arteriography revealed a tight stenosis of the left sided  femoral anastomosis of the femoral/femoral bypass graft.  There is a  moderate stenosis in the right femoral artery.  The patient is brought to  the operating room at this time for revision.   OPERATIVE PROCEDURE:  The patient was brought to the operating room in  stable condition.  She received preoperative Zinacef 1.5 grams  intravenously.  General endotracheal anesthesia was induced.  Both legs were  prepped and draped in a sterile fashion.  Bilateral longitudinal skin  incisions were made through the scar in the  groins.  Subcutaneous tissue and  lymphatics were divided with electrocautery.  The inguinal ligament was  identified bilaterally.  The femoral anastomosis of the femoral/femoral  graft was dissected out bilaterally.  The common femoral artery, superficial  femoral, and profunda femoris origins were all encircled with vessel loops.  The patient was then administered 5000 units of heparin intravenously.  The  right femoral vessel was controlled with clamps.  A longitudinal arteriotomy  was made over the hood of the right femoral anastomosis.  There was moderate  pseudointimal build up.  The graft was opened out onto the common femoral  artery.  A patch angioplasty was carried out with a Finesse Dacron patch  using running 5-0 Prolene suture.  At the completion of the patch  angioplasty, the right femoral vessels were flushed and the right femoral  vessels released, and the graft controlled with a Fogarty clamp.  Attention  was turned to the left groin.  The femoral vessel was controlled with  clamps.  A longitudinal incision was made over the hood of the left femoral  anastomosis.  There was a moderate to severe stenosis present.  Pseudointimal build up was present.  Some of this was trimmed away.  The  common femoral artery was opened longitudinally.  A patch angioplasty of the  left femoral anastomosis was carried out with a Finesse Dacron patch and  running 5-0 Prolene suture.  At the completion of the patch angioplasty, the  vessels were flushed, clamps then removed, and both legs reperfused.  Adequate hemostasis was obtained.  Sponge and instrument counts were  correct.  The groin was irrigated with antibiotic solution.  The patient was  administered 50  mg protamine intravenously.  Both groins were then closed with running 2-0  Vicryl suture in two subcutaneous deep layers.  Running 3-0 Vicryl suture  for the superficial subcutaneous layer and staples applied to the skin.  The   patient tolerated the procedure well.  She was transferred to the recovery  room in stable condition.                                               Balinda Quails, M.D.    PGH/MEDQ  D:  01/25/2004  T:  01/25/2004  Job:  [id number]

## 2011-05-09 NOTE — Consult Note (Signed)
NAME:  Andrea [name], Andrea [name]                       ACCOUNT NO.:  [id number]   MEDICAL RECORD NO.:  [id number]                   PATIENT TYPE:  INP   LOCATION:  6525                                 FACILITY:  [facility]   PHYSICIAN:  Balinda Quails, M.D.                 DATE OF BIRTH:  10-Oct-1966   DATE OF CONSULTATION:  10/14/2002  DATE OF DISCHARGE:  10/15/2002                                   CONSULTATION   PRIMARY CARE PHYSICIAN:  Leanne Chang, M.D.   REASON FOR CONSULTATION:  1. 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  2. 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   HISTORY OF PRESENT ILLNESS:  This is a 45 year old female who was admitted  to [facility] on October 13, 2002, with a history of chest pain and  discomfort in the right anterior chest radiating into the right arm.  This  was not associated with diaphoresis, nausea, or vomiting.  This was a dull  discomfort.  No shortness of breath.  There was a worsening of pain with a  deep breath.   The patient did describe a recent history of sinus and ear infection with  antibiotics.   MEDICATIONS ON ADMISSION:  1. Paxil 30 mg p.o. daily.  2. Nexium 40 mg p.o. daily p.r.n.   PAST SURGICAL HISTORY:  1. Cholecystectomy in 2003.  2. Cesarean section in 1997.  3. Tubal ligation in 1999.   PAST MEDICAL HISTORY:  1. Depression.  2. Recurrent sinusitis and bronchitis.  3. Gastroesophageal reflux disease.  4. Borderline hypertension.   SOCIAL HISTORY:  The patient is married, living in the [place] area.  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  She  has one child, a 66-year-old son.  She smokes one pack of cigarettes per day.  She consumes alcohol minimally.   ALLERGIES:  No known drug allergies.   FAMILY HISTORY:  The patient's mother is alive at age of 82, with peripheral  vascular disease and  coronary artery disease and hypertension.  She has  undergone previous coronary artery bypass.  Father is alive at age 41, with  a history of MI and hypertension.  She has one brother and one sister who  are alive and well.   REVIEW OF SYSTEMS:  The patient describes no recent weight loss, fever,  chills.  No headache or dizziness.  No visual disturbance.  No nausea,  vomiting, constipation, diarrhea, abdominal pain.  No dysuria or frequency.  She does have a chronic cough, no shortness of breath.  The patient denies  any neurologic symptoms including sensory, motor, or visual deficit.  She  does have chronic left lower extremity claudication with discomfort  radiating from the left hip and thigh into the lower leg with walking.  This  is relieved readily by rest.  No rest pain.  This has been present for  approximately three years.   PHYSICAL EXAMINATION:  GENERAL:  Alert, oriented 45 year old female who is  in no distress.  VITAL SIGNS:  BP 130/80, pulse 80 per minute and regular, respirations 20  per minute, temperature 98.2 degrees Fahrenheit.  HEENT:  Mouth and throat are clear.  Normocephalic.  NECK:  Supple.  No thyromegaly or adenopathy.  CHEST:  Air entry equal bilaterally.  Without rales or rhonchi.  CARDIOVASCULAR:  Right carotid bruit.  Normal heart sounds.  No murmurs or  extra sounds.  ABDOMEN:  Soft and nontender.  No organomegaly or masses felt.  No abdominal  bruits.  EXTREMITIES:  Absent left femoral, popliteal, posterior tibial, and dorsalis  pedis pulses.  Right femoral bruit with 2+ pulse, 1+ right postoperative,  posterior tibial, and dorsalis pedis pulses.  NEUROLOGIC:  Alert and oriented.  Cranial nerves intact.  Strength equal  bilaterally.  With 2+ reflexes.   LABORATORY DATA:  Doppler evaluation reveals a severe right internal carotid  artery stenosis of greater than 80% and moderate left internal carotid  artery stenosis of 60-80%.  Lower extremity  Doppler reveals ankle brachial  indices 0.97 on the right, 0.66 on the left, with findings consistent with  left iliofemoral occlusive disease.   IMPRESSION:  The patient is a 44 year old female with a strong family  history of atherosclerotic vascular disease.  She is a tobacco abuser.   She shows evidence of severe asymptomatic right internal carotid artery  stenosis.  She also has left lower extremity claudication associated with  iliofemoral occlusive disease and moderate reduction of left ankle brachial  index.   RECOMMENDATIONS:  Once a disposition has been made regarding further workup  for chest pain, I do feel that she should undergo further investigations for  extracranial cerebrovascular occlusive disease and lower extremity  peripheral vascular disease.  These will be arranged through the office once  the patient has been discharged from the hospital.                                               P. Liliane Bade, M.D.    PGH/MEDQ  D:  10/14/2002  T:  10/16/2002  Job:  [id number]   cc:   Leanne Chang, M.D.  [address]  [place]  [place] [address]  Fax: [phone]

## 2011-05-09 NOTE — Op Note (Signed)
[facility]  Patient:    GIULIANA, HANDYSIDE Visit Number: [id number] MRN: [id number]          Service Type: SUR Location: 3W [id number] 01 Attending Physician:  Vikki Ports Dictated by:   Catalina Lunger, M.D. Proc. Date: 07/05/02 Admit Date:  07/05/2002 Discharge Date: 07/06/2002                             Operative Report  PREOPERATIVE DIAGNOSIS:  Symptomatic cholelithiasis.  POSTOPERATIVE DIAGNOSIS:  Symptomatic cholelithiasis.  OPERATION:  Laparoscopic cholecystectomy.  SURGEON:  Catalina Lunger, M.D.  ASSISTANT:  Rose Phi. Maple Hudson, M.D.  ANESTHESIA:  General.  DESCRIPTION OF PROCEDURE:  The patient was taken to the operating room and placed in a supine position.  After adequate anesthesia was induced using endotracheal tube, the abdomen was prepped and draped in normal sterile fashion.  An infraumbilical transverse incision as made.  I dissected down to the fascia.  The fascia was opened vertically.   An 0 Vicryl pursestring suture placed around the fascial defect.  Hasson trocar was placed in the abdomen, and the abdomen was insufflated with carbon dioxide.  Under direct visualization, a 10 mm port was placed in the subxiphoid region.  Two 5 mm ports were placed in the right abdomen.   First the right lower quadrant was inspected because the patient had been complaining of right lower quadrant pain for five days.  She was found to have some clear fluid surrounding the right ovary.  Appendix and right colon looked completely normal  I turned by attention to the gallbladder.  It was retracted cephalad. Infundibulum was identified, and dissection of fatty tissue as well as some areolar tissue was performed using blunt and sharp dissection.  The cystic duct was easily identified.  Its junction with the gallbladder was identified. A good window was created behind it visualizing the liver and cystic artery. The cystic duct was  triple clipped and divided.  The cystic artery was dissected free.  A good window was created behind this as well visualizing the liver.  It was triply clipped and divided.  The gallbladder was taken off the gallbladder bed using Bovie electrocautery.  The gallbladder was then removed through the umbilical port.  Adequate hemostasis was assured.  The right upper quadrant was copiously irrigated.  Pneumoperitoneum was released.  Trocars were removed.  Infraumbilical fascia defect was closed with the 0 Vicryl pursestring suture.  Skin incisions were closed with subcuticular 4-0 Monocryl.  Steri-Strips and sterile dressings were applied.  The patient tolerated the procedure well and went to PACU in good condition. Dictated by:   Catalina Lunger, M.D. Attending Physician:  Danna Hefty R. DD:  07/05/02 TD:  07/07/02 Job: [id number]

## 2011-09-16 LAB — COMPREHENSIVE METABOLIC PANEL
ALT: 25
AST: 21
Alkaline Phosphatase: 90
CO2: 27
Chloride: 104
Creatinine, Ser: 0.51
GFR calc Af Amer: >60
GFR calc non Af Amer: >60
Potassium: 4.5
Sodium: 139
Total Bilirubin: 0.5

## 2011-09-16 LAB — URINALYSIS, ROUTINE W REFLEX MICROSCOPIC
Bilirubin Urine: NEGATIVE
Hgb urine dipstick: NEGATIVE
Specific Gravity, Urine: 1.007
Urobilinogen, UA: 0.2
pH: 6.5

## 2011-09-16 LAB — CBC
HCT: 41.4
Hemoglobin: 14.2
MCHC: 34.3
MCV: 89.9
MCV: 90.4
RBC: 4.6
RBC: 4.95
WBC: 8.9

## 2011-09-16 LAB — BASIC METABOLIC PANEL
CO2: 26
Chloride: 103
GFR calc Af Amer: >60
Sodium: 137

## 2011-09-16 LAB — TYPE AND SCREEN: ABO/RH(D): O NEG

## 2011-11-12 DIAGNOSIS — I6529 Occlusion and stenosis of unspecified carotid artery: Secondary | ICD-10-CM | POA: Insufficient documentation

## 2011-11-12 HISTORY — DX: Occlusion and stenosis of unspecified carotid artery: I65.29

## 2014-02-10 ENCOUNTER — Telehealth: Payer: Self-pay | Admitting: *Deleted

## 2014-02-10 ENCOUNTER — Ambulatory Visit (INDEPENDENT_AMBULATORY_CARE_PROVIDER_SITE_OTHER): Payer: Medicare PPO | Admitting: Family Medicine

## 2014-02-10 ENCOUNTER — Encounter: Payer: Self-pay | Admitting: Family Medicine

## 2014-02-10 VITALS — BP 120/82 | HR 84 | Temp 97.8°F | Resp 16 | Ht 64.0 in | Wt 169.0 lb

## 2014-02-10 DIAGNOSIS — I70209 Unspecified atherosclerosis of native arteries of extremities, unspecified extremity: Secondary | ICD-10-CM

## 2014-02-10 DIAGNOSIS — I70219 Atherosclerosis of native arteries of extremities with intermittent claudication, unspecified extremity: Secondary | ICD-10-CM

## 2014-02-10 DIAGNOSIS — F329 Major depressive disorder, single episode, unspecified: Secondary | ICD-10-CM

## 2014-02-10 DIAGNOSIS — E119 Type 2 diabetes mellitus without complications: Secondary | ICD-10-CM

## 2014-02-10 DIAGNOSIS — F419 Anxiety disorder, unspecified: Secondary | ICD-10-CM

## 2014-02-10 DIAGNOSIS — F341 Dysthymic disorder: Secondary | ICD-10-CM

## 2014-02-10 DIAGNOSIS — I10 Essential (primary) hypertension: Secondary | ICD-10-CM

## 2014-02-10 DIAGNOSIS — E785 Hyperlipidemia, unspecified: Secondary | ICD-10-CM

## 2014-02-10 DIAGNOSIS — F32A Depression, unspecified: Secondary | ICD-10-CM

## 2014-02-10 LAB — CBC
HCT: 45.6 % (ref 36.0–46.0)
Hemoglobin: 15.7 g/dL — ABNORMAL HIGH (ref 12.0–15.0)
MCH: 29.2 pg (ref 26.0–34.0)
MCHC: 34.4 g/dL (ref 30.0–36.0)
MCV: 84.8 fL (ref 78.0–100.0)
Platelets: 285 10*3/uL (ref 150–400)
RBC: 5.38 MIL/uL — ABNORMAL HIGH (ref 3.87–5.11)
RDW: 13.9 % (ref 11.5–15.5)
WBC: 8.8 10*3/uL (ref 4.0–10.5)

## 2014-02-10 LAB — COMPREHENSIVE METABOLIC PANEL WITH GFR
Albumin: 4.9 g/dL (ref 3.5–5.2)
Alkaline Phosphatase: 91 U/L (ref 39–117)
BUN: 13 mg/dL (ref 6–23)
Calcium: 9.5 mg/dL (ref 8.4–10.5)
Chloride: 102 meq/L (ref 96–112)
Glucose, Bld: 226 mg/dL — ABNORMAL HIGH (ref 70–99)
Potassium: 4.2 meq/L (ref 3.5–5.3)
Sodium: 138 meq/L (ref 135–145)
Total Protein: 7.6 g/dL (ref 6.0–8.3)

## 2014-02-10 LAB — COMPREHENSIVE METABOLIC PANEL
ALT: 29 U/L (ref 0–35)
AST: 19 U/L (ref 0–37)
CO2: 27 mEq/L (ref 19–32)
Creat: 0.65 mg/dL (ref 0.50–1.10)
Total Bilirubin: 0.5 mg/dL (ref 0.2–1.2)

## 2014-02-10 LAB — POCT GLYCOSYLATED HEMOGLOBIN (HGB A1C): Hemoglobin A1C: 8.9

## 2014-02-10 MED ORDER — AMLODIPINE BESYLATE 10 MG PO TABS: 10.0000 mg | ORAL_TABLET | Freq: Every day | ORAL | Status: DC

## 2014-02-10 MED ORDER — ALPRAZOLAM 0.5 MG PO TABS: 0.5000 mg | ORAL_TABLET | Freq: Three times a day (TID) | ORAL | Status: DC | PRN

## 2014-02-10 MED ORDER — VENLAFAXINE HCL ER 150 MG PO CP24: 150.0000 mg | ORAL_CAPSULE | Freq: Every day | ORAL | Status: DC

## 2014-02-10 MED ORDER — GLIPIZIDE ER 10 MG PO TB24: 10.0000 mg | ORAL_TABLET | Freq: Every day | ORAL | Status: DC

## 2014-02-10 MED ORDER — CLOPIDOGREL BISULFATE 75 MG PO TABS: 75.0000 mg | ORAL_TABLET | Freq: Every day | ORAL | Status: DC

## 2014-02-10 NOTE — Progress Notes (Signed)
 Chief Complaint:  Chief Complaint  Patient presents with  . pt here to estab care, need med refills    HPI: Andrea [name] is a 48 y.o. female who is here for refills of meds and establish care.  She has not been on meds for "a while" She has been off of her diabetes meds about 1 week ago She has been off her other meds off and on She moved back from [facility] about 1 year ago She has been getting her BP checked at [facility] and has been running higher than 150/90, around 170s/90s She does not measure her FG, last took it "some time ag"o She get depressed and irritable and moody and has crying spells. Has been off her antidepressant and anxiolytics for several months due to inability to afford medicine. SHe is on disability and her husband is on diasability but he was not getting a check and she was the sole provider for both them and her kids. She states she spends about 300 every 3 months for all her meds.   [facility]Dr Gordy Clement did most of her surgeries until he moved to [place]. Stopped smoking 3 years ago.    Past Medical History  Diagnosis Date  . Hypertension   . Anxiety     on Alprazolam  . Depression     on Effexor  . Diabetes mellitus without complication     0488  . Heart murmur     was seen by cardiologist in [facility] 2013   Past Surgical History  Procedure Laterality Date  . Cholecystectomy    . Cesarean section    . Tubal ligation    . Femoral bypass      x 2 on right side due to re-stenosis  . Left/right carotid artery      2003 and 2009 for right, right side is 100% occluded per patient; left side 2009  . 2 stents     History   Social History  . Marital Status: Married    Spouse Name: N/A    Number of Children: N/A  . Years of Education: N/A   Social History Main Topics  . Smoking status: Former Research scientist (life sciences)  . Smokeless tobacco: None  . Alcohol Use: Yes     Comment:  holidays/special occas 3-4 drinks  . Drug Use: No  . Sexual Activity: None   Other Topics Concern  . None   Social History Narrative  . None   Family History  Problem Relation Age of Onset  . Diabetes Mother   . Heart disease Mother   . Hyperlipidemia Mother   . Hypertension Mother   . Heart disease Father   . Diabetes Maternal Grandmother   . Heart disease Maternal Grandmother   . Hyperlipidemia Maternal Grandmother   . Cancer Paternal Grandmother    No Known Allergies Prior to Admission medications   Medication Sig Start Date End Date Taking? Authorizing Provider  ALPRAZolam Duanne Moron) 0.5 MG tablet Take 0.5 mg by mouth at bedtime as needed for anxiety.   Yes Historical Provider, MD  amLODipine (NORVASC) 10 MG tablet Take 10 mg by mouth daily.   Yes Historical Provider, MD  aspirin 325 MG tablet Take 325 mg by mouth daily.   Yes Historical Provider, MD  clopidogrel (PLAVIX) 75 MG tablet Take 75 mg by mouth daily with breakfast.   Yes Historical Provider, MD  fenofibrate 54 MG tablet Take 54 mg by mouth daily.   Yes Historical Provider, MD  glipiZIDE (GLUCOTROL XL) 10 MG 24 hr tablet Take 10 mg by mouth daily with breakfast.   Yes Historical Provider, MD  PRESCRIPTION MEDICATION Amlodipine Besylate 10 mg taking 1 a day   Yes Historical Provider, MD  PRESCRIPTION MEDICATION Metformin HCL ER 750 mg taking 1 tablet daily   Yes Historical Provider, MD  venlafaxine XR (EFFEXOR-XR) 150 MG 24 hr capsule Take 150 mg by mouth daily with breakfast.   Yes Historical Provider, MD     ROS: The patient denies fevers, chills, night sweats, unintentional weight loss, chest pain, palpitations, wheezing, dyspnea on exertion, nausea, vomiting, abdominal pain, dysuria, hematuria, melena, numbness, weakness, or tingling.   All other systems have been reviewed and were otherwise negative with the exception of those mentioned in the HPI and as above.    PHYSICAL EXAM: Filed Vitals:   02/10/14 1403    BP: 120/82  Pulse: 84  Temp: 97.8 F (36.6 C)  Resp: 16   Filed Vitals:   02/10/14 1403  Height: '5\' 4"'  (1.626 m)  Weight: 169 lb (76.658 kg)   Body mass index is 28.99 kg/(m^2).  General: Alert, no acute distress HEENT:  Normocephalic, atraumatic, oropharynx patent. EOMI, PERRLA Cardiovascular:  Regular rate and rhythm, no rubs murmurs or gallops.  Radial pulse intact. No pedal edema.  Respiratory: Clear to auscultation bilaterally.  No wheezes, rales, or rhonchi.  No cyanosis, no use of accessory musculature GI: No organomegaly, abdomen is soft and non-tender, positive bowel sounds.  No masses. Skin: No rashes. Neurologic: Facial musculature symmetric. Psychiatric: Patient is appropriate throughout our interaction. Lymphatic: No cervical lymphadenopathy Musculoskeletal: Gait intact.   LABS: Results for orders placed during the hospital encounter of 03/24/08  CBC      Result Value Ref Range   WBC 8.9     RBC 4.95     Hemoglobin 15.2 (*)    HCT 44.7     MCV 90.4     MCHC 34.1     RDW 14.2     Platelets 239    COMPREHENSIVE METABOLIC PANEL      Result Value Ref Range   Sodium 139     Potassium 4.5     Chloride 104     CO2 27     Glucose, Bld 106 (*)    BUN 5 (*)    Creatinine, Ser 0.51     Calcium 9.3     Total Protein 7.2     Albumin 4.1     AST 21     ALT 25     Alkaline Phosphatase 90     Total Bilirubin 0.5     GFR calc non Af Amer >60     GFR calc Af Amer       Value: >60            The eGFR has been calculated     using the MDRD equation.     This calculation has not been     validated in all clinical  PROTIME-INR      Result Value Ref Range   Prothrombin Time 11.9     INR 0.9    APTT      Result Value Ref Range   aPTT 27    URINALYSIS, ROUTINE W REFLEX MICROSCOPIC      Result Value Ref Range   Color, Urine YELLOW  APPearance CAR     Specific Gravity, Urine 1.007     pH 6.5     Glucose, UA NEGATIVE     Hgb urine dipstick NEGATIVE      Bilirubin Urine NEGATIVE     Ketones, ur NEGATIVE     Protein, ur NEGATIVE     Urobilinogen, UA 0.2     Nitrite NEGATIVE     Leukocytes, UA       Value: NEGATIVE MICROSCOPIC NOT DONE ON URINES WITH NEGATIVE PROTEIN, BLOOD, UKOCYTES, NITRITE, OR GLUCOSE <1000 mg/dL.  BASIC METABOLIC PANEL      Result Value Ref Range   Sodium 137     Potassium 3.9     Chloride 103     CO2 26     Glucose, Bld 121 (*)    BUN 4 (*)    Creatinine, Ser 0.56     Calcium 8.8     GFR calc non Af Amer >60     GFR calc Af Amer       Value: >60            The eGFR has been calculated     using the MDRD equation.     This calculation has not been     validated in all clinical  CBC      Result Value Ref Range   WBC 12.1 (*)    RBC 4.60     Hemoglobin 14.2     HCT 41.4     MCV 89.9     MCHC 34.3     RDW 14.1     Platelets 229    TYPE AND SCREEN      Result Value Ref Range   ABO/RH(D) O NEG     Antibody Screen NEG     Sample Expiration 03/26/2008    ABO/RH      Result Value Ref Range   ABO/RH(D) O NEG       EKG/XRAY:   Primary read interpreted by Dr. Marin Comment at [facility].   ASSESSMENT/PLAN: Encounter Diagnoses  Name Primary?  . Type II or unspecified type diabetes mellitus without mention of complication, not stated as uncontrolled Yes  . Atherosclerotic peripheral vascular disease   . Anxiety and depression   . Atherosclerotic peripheral vascular disease with intermittent claudication   . Other and unspecified hyperlipidemia    Needs [facility] to [facility] Refilled all meds Labs pending Take BP and fasting glucose Follow-up in 1 month  Gross sideeffects, risk and benefits, and alternatives of medications d/w patient. Patient is aware that all medications have potential sideeffects and we are unable to predict every sideeffect or drug-drug interaction that may occur.  , [place], DO 02/10/2014 2:51 PM

## 2014-02-10 NOTE — Telephone Encounter (Signed)
Faxed signed medical release form to [facility] office, per Dr Marin Comment. Confirmation page received at 3:40 pm.

## 2014-02-15 ENCOUNTER — Telehealth: Payer: Self-pay | Admitting: Family Medicine

## 2014-02-15 ENCOUNTER — Encounter: Payer: Self-pay | Admitting: Family Medicine

## 2014-02-15 NOTE — Telephone Encounter (Signed)
LM about labs, f/u in 1 month. May need to repeat CBC due to elevated hgb at 15.7. She has had that before but was 5 years ago.

## 2014-02-28 ENCOUNTER — Other Ambulatory Visit: Payer: Self-pay | Admitting: *Deleted

## 2014-02-28 DIAGNOSIS — I739 Peripheral vascular disease, unspecified: Secondary | ICD-10-CM

## 2014-02-28 DIAGNOSIS — I6529 Occlusion and stenosis of unspecified carotid artery: Secondary | ICD-10-CM

## 2014-03-10 ENCOUNTER — Ambulatory Visit (INDEPENDENT_AMBULATORY_CARE_PROVIDER_SITE_OTHER): Payer: Medicare PPO | Admitting: Family Medicine

## 2014-03-10 VITALS — BP 134/76 | HR 76 | Temp 98.3°F | Resp 16 | Ht 64.0 in | Wt 167.0 lb

## 2014-03-10 DIAGNOSIS — R7989 Other specified abnormal findings of blood chemistry: Secondary | ICD-10-CM

## 2014-03-10 DIAGNOSIS — F32A Depression, unspecified: Secondary | ICD-10-CM

## 2014-03-10 DIAGNOSIS — E785 Hyperlipidemia, unspecified: Secondary | ICD-10-CM

## 2014-03-10 DIAGNOSIS — F341 Dysthymic disorder: Secondary | ICD-10-CM

## 2014-03-10 DIAGNOSIS — E119 Type 2 diabetes mellitus without complications: Secondary | ICD-10-CM

## 2014-03-10 DIAGNOSIS — F419 Anxiety disorder, unspecified: Secondary | ICD-10-CM

## 2014-03-10 DIAGNOSIS — F329 Major depressive disorder, single episode, unspecified: Secondary | ICD-10-CM

## 2014-03-10 LAB — CBC
HCT: 40.9 % (ref 36.0–46.0)
Hemoglobin: 14.4 g/dL (ref 12.0–15.0)
MCH: 29.1 pg (ref 26.0–34.0)
MCHC: 35.2 g/dL (ref 30.0–36.0)
MCV: 82.8 fL (ref 78.0–100.0)
Platelets: 313 10*3/uL (ref 150–400)
RBC: 4.94 MIL/uL (ref 3.87–5.11)
RDW: 14 % (ref 11.5–15.5)
WBC: 6.8 10*3/uL (ref 4.0–10.5)

## 2014-03-10 LAB — LIPID PANEL
Cholesterol: 197 mg/dL (ref 0–200)
HDL: 36 mg/dL — ABNORMAL LOW (ref >39)
LDL Cholesterol: 129 mg/dL — ABNORMAL HIGH (ref 0–99)
Total CHOL/HDL Ratio: 5.5 Ratio
Triglycerides: 161 mg/dL — ABNORMAL HIGH (ref <150)
VLDL: 32 mg/dL (ref 0–40)

## 2014-03-10 MED ORDER — METFORMIN HCL ER 750 MG PO TB24: 750.0000 mg | ORAL_TABLET | Freq: Every day | ORAL | Status: DC

## 2014-03-10 MED ORDER — ALPRAZOLAM 0.5 MG PO TABS: 0.5000 mg | ORAL_TABLET | Freq: Three times a day (TID) | ORAL | Status: DC | PRN

## 2014-03-10 MED ORDER — FENOFIBRATE 54 MG PO TABS: 54.0000 mg | ORAL_TABLET | Freq: Every day | ORAL | Status: DC

## 2014-03-10 NOTE — Patient Instructions (Signed)
[facility] Policy for Prescribing Controlled Substances (Revised 10/2012) 1. Prescriptions for controlled substances will be filled by ONE provider at [facility] with whom you have established and developed a plan for your care, including follow-up. 2. You are encouraged to schedule an appointment with your prescriber at our appointment center for follow-up visits whenever possible. 3. If you request a prescription for the controlled substance while at [facility] for an acute problem (with someone other than your regular prescriber), you MAY be given a ONE-TIME prescription for a 30-day supply of the controlled substance, to allow time for you to return to see your regular prescriber for additional prescriptions. 

## 2014-03-10 NOTE — Progress Notes (Signed)
Chief Complaint:  Chief Complaint  Patient presents with  . Follow-up    DM & Meds    HPI: Andrea [name] is a 48 y.o. female who is here for  Recheck of diabetes Her FBG has not been less than 198, Her highest is at 250s She has no hypoglycemia or neuropathy. She did not get her metformin  875 ER filled but never called [place] to let us know, she has only been on glipizide  She has restarted on her meds for anxiety and depression, sxs are well controlled. Decrease mood swings. NO SI/HI  She also has hyperlipidemia and has not been on any meds for it either, use to be on fenofibrate She is fasting today and wants lipids checked  Past Medical History  Diagnosis Date  . Hypertension   . Anxiety     on Alprazolam  . Depression     on Effexor  . Diabetes mellitus without complication     0347  . Heart murmur     was seen by cardiologist in [facility] 2013   Past Surgical History  Procedure Laterality Date  . Cholecystectomy    . Cesarean section    . Tubal ligation    . Femoral bypass      x 2 on right side due to re-stenosis  . Left/right carotid artery      2003 and 2009 for right, right side is 100% occluded per patient; left side 2009  . 2 stents     History   Social History  . Marital Status: Married    Spouse Name: N/A    Number of Children: N/A  . Years of Education: N/A   Social History Main Topics  . Smoking status: Former Research scientist (life sciences)  . Smokeless tobacco: Not on file  . Alcohol Use: Yes     Comment: holidays/special occas 3-4 drinks  . Drug Use: No  . Sexual Activity: Not on file   Other Topics Concern  . Not on file   Social History Narrative  . No narrative on file   Family History  Problem Relation Age of Onset  . Diabetes Mother   . Heart disease Mother   . Hyperlipidemia Mother   . Hypertension Mother   . Heart disease Father   . Diabetes Maternal Grandmother   . Heart disease Maternal Grandmother   . Hyperlipidemia Maternal  Grandmother   . Cancer Paternal Grandmother    Allergies  Allergen Reactions  . Other     Contrast dye    Prior to Admission medications   Medication Sig Start Date End Date Taking? Authorizing Provider  ALPRAZolam Duanne Moron) 0.5 MG tablet Take 1 tablet (0.5 mg total) by mouth 3 (three) times daily as needed for anxiety. 02/10/14  Yes Annaliyah Willig P Vishwa Dais, DO  amLODipine (NORVASC) 10 MG tablet Take 1 tablet (10 mg total) by mouth daily. 02/10/14  Yes Leonia Heatherly P Decoda Van, DO  aspirin 325 MG tablet Take 325 mg by mouth daily.   Yes Historical Provider, MD  clopidogrel (PLAVIX) 75 MG tablet Take 1 tablet (75 mg total) by mouth daily with breakfast. 02/10/14  Yes Deserea Bordley P Grayland Daisey, DO  glipiZIDE (GLUCOTROL XL) 10 MG 24 hr tablet Take 1 tablet (10 mg total) by mouth daily with breakfast. 02/10/14  Yes [place] P Daven Montz, DO  venlafaxine XR (EFFEXOR-XR) 150 MG 24 hr capsule Take 1 capsule (150 mg total) by mouth daily with breakfast. 02/10/14  Yes Laine Giovanetti P  Dnasia Gauna, DO     ROS: The patient denies fevers, chills, night sweats, unintentional weight loss, chest pain, palpitations, wheezing, dyspnea on exertion, nausea, vomiting, abdominal pain, dysuria, hematuria, melena, numbness, weakness, or tingling.   All other systems have been reviewed and were otherwise negative with the exception of those mentioned in the HPI and as above.    PHYSICAL EXAM: Filed Vitals:   03/10/14 1013  BP: 134/76  Pulse: 76  Temp: 98.3 F (36.8 C)  Resp: 16   Filed Vitals:   03/10/14 1013  Height: 5\' 4"  (1.626 m)  Weight: 167 lb (75.751 kg)   Body mass index is 28.65 kg/(m^2).  General: Alert, no acute distress HEENT:  Normocephalic, atraumatic, oropharynx patent. EOMI, PERRLA Cardiovascular:  Regular rate and rhythm, no rubs murmurs or gallops.  No Carotid bruits, radial pulse intact. No pedal edema.  Respiratory: Clear to auscultation bilaterally.  No wheezes, rales, or rhonchi.  No cyanosis, no use of accessory musculature GI: No organomegaly, abdomen is  soft and non-tender, positive bowel sounds.  No masses. Skin: No rashes. Neurologic: Facial musculature symmetric. Psychiatric: Patient is appropriate throughout our interaction. Lymphatic: No cervical lymphadenopathy Musculoskeletal: Gait intact.   LABS: Results for orders placed in visit on 02/10/14  CBC      Result Value Ref Range   WBC 8.8  4.0 - 10.5 K/uL   RBC 5.38 (*) 3.87 - 5.11 MIL/uL   Hemoglobin 15.7 (*) 12.0 - 15.0 g/dL   HCT 45.6  36.0 - 46.0 %   MCV 84.8  78.0 - 100.0 fL   MCH 29.2  26.0 - 34.0 pg   MCHC 34.4  30.0 - 36.0 g/dL   RDW 13.9  11.5 - 15.5 %   Platelets 285  150 - 400 K/uL  COMPREHENSIVE METABOLIC PANEL      Result Value Ref Range   Sodium 138  135 - 145 mEq/L   Potassium 4.2  3.5 - 5.3 mEq/L   Chloride 102  96 - 112 mEq/L   CO2 27  19 - 32 mEq/L   Glucose, Bld 226 (*) 70 - 99 mg/dL   BUN 13  6 - 23 mg/dL   Creat 0.65  0.50 - 1.10 mg/dL   Total Bilirubin 0.5  0.2 - 1.2 mg/dL   Alkaline Phosphatase 91  39 - 117 U/L   AST 19  0 - 37 U/L   ALT 29  0 - 35 U/L   Total Protein 7.6  6.0 - 8.3 g/dL   Albumin 4.9  3.5 - 5.2 g/dL   Calcium 9.5  8.4 - 10.5 mg/dL  POCT GLYCOSYLATED HEMOGLOBIN (HGB A1C)      Result Value Ref Range   Hemoglobin A1C 8.9       EKG/XRAY:   Primary read interpreted by Dr. Marin Comment at [facility].   ASSESSMENT/PLAN: Encounter Diagnoses  Name Primary?  . Type II or unspecified type diabetes mellitus without mention of complication, not stated as uncontrolled Yes  . Anxiety and depression   . Hyperlipidemia   . Abnormal CBC    Refilled Xanax Rx Metformin and also Fenofibrate Lipids pending F/u in 3 months  Gross sideeffects, risk and benefits, and alternatives of medications d/w patient. Patient is aware that all medications have potential sideeffects and we are unable to predict every sideeffect or drug-drug interaction that may occur.  Charene Mccallister, [place], DO 03/10/2014 11:06 AM

## 2014-03-11 LAB — TSH: TSH: 1.006 u[IU]/mL (ref 0.350–4.500)

## 2014-03-14 ENCOUNTER — Telehealth: Payer: Self-pay | Admitting: Family Medicine

## 2014-03-14 DIAGNOSIS — E785 Hyperlipidemia, unspecified: Secondary | ICD-10-CM

## 2014-03-14 MED ORDER — SIMVASTATIN 10 MG PO TABS: 10.0000 mg | ORAL_TABLET | Freq: Every day | ORAL | Status: DC

## 2014-03-14 NOTE — Telephone Encounter (Signed)
Spoke to her about labs for her and wyatt. Wills tart on simvastatin for her.

## 2014-03-15 ENCOUNTER — Encounter: Payer: Self-pay | Admitting: Family Medicine

## 2014-03-22 ENCOUNTER — Encounter: Payer: Self-pay | Admitting: Vascular Surgery

## 2014-03-22 ENCOUNTER — Other Ambulatory Visit (HOSPITAL_COMMUNITY): Payer: Self-pay

## 2014-03-22 ENCOUNTER — Encounter (HOSPITAL_COMMUNITY): Payer: Self-pay

## 2014-04-11 ENCOUNTER — Encounter: Payer: Self-pay | Admitting: Vascular Surgery

## 2014-04-12 ENCOUNTER — Ambulatory Visit (HOSPITAL_COMMUNITY)
Admission: RE | Admit: 2014-04-12 | Discharge: 2014-04-12 | Disposition: A | Discharge status: Home / Self Care | Payer: Medicare FFS | Source: Ambulatory Visit | Attending: Vascular Surgery | Admitting: Vascular Surgery

## 2014-04-12 ENCOUNTER — Ambulatory Visit (INDEPENDENT_AMBULATORY_CARE_PROVIDER_SITE_OTHER)
Admission: RE | Admit: 2014-04-12 | Discharge: 2014-04-12 | Disposition: A | Discharge status: Home / Self Care | Payer: Medicare FFS | Source: Ambulatory Visit | Attending: Vascular Surgery | Admitting: Vascular Surgery

## 2014-04-12 ENCOUNTER — Ambulatory Visit (INDEPENDENT_AMBULATORY_CARE_PROVIDER_SITE_OTHER): Payer: Medicare FFS | Admitting: Vascular Surgery

## 2014-04-12 ENCOUNTER — Encounter (INDEPENDENT_AMBULATORY_CARE_PROVIDER_SITE_OTHER): Payer: Self-pay

## 2014-04-12 ENCOUNTER — Encounter: Payer: Self-pay | Admitting: Vascular Surgery

## 2014-04-12 VITALS — BP 141/78 | HR 70 | Ht 64.0 in | Wt 168.0 lb

## 2014-04-12 DIAGNOSIS — I6529 Occlusion and stenosis of unspecified carotid artery: Secondary | ICD-10-CM

## 2014-04-12 DIAGNOSIS — I739 Peripheral vascular disease, unspecified: Secondary | ICD-10-CM

## 2014-04-12 DIAGNOSIS — Z48812 Encounter for surgical aftercare following surgery on the circulatory system: Secondary | ICD-10-CM

## 2014-04-12 DIAGNOSIS — I70213 Atherosclerosis of native arteries of extremities with intermittent claudication, bilateral legs: Secondary | ICD-10-CM

## 2014-04-12 DIAGNOSIS — I70203 Unspecified atherosclerosis of native arteries of extremities, bilateral legs: Secondary | ICD-10-CM

## 2014-04-12 HISTORY — DX: Occlusion and stenosis of unspecified carotid artery: I65.29

## 2014-04-12 NOTE — Assessment & Plan Note (Signed)
Sh I've ordered followed ABIs in the duplex in one year now see her back at that time.e has a mild thigh claudication bilaterally which is stable. Her right common iliac artery stent is patent and her fem-fem bypass graft is patent. I have encouraged her to stay as active as possible. She is on aspirin and Plavix. She is on a statin.

## 2014-04-12 NOTE — Progress Notes (Signed)
[facility] [place]  Patient name: Andrea [name] MRN: [id number] DOB: Jan 02, 1966 Sex: female  REASON FOR CONSULT: To establish vascular follow up.  HPI: Andrea [name] is a 48 y.o. female who is had multiple procedures by Dr. Drucie Opitz in the past. In 2003 she a right carotid endarterectomy. Her right carotid artery subsequently occluded. She also had a right to left fem-fem bypass graft with a 7 mm Dacron graft in 2003. In addition she had a previous right common iliac artery stent. This was revised in 2005. In 2009 she had a left carotid endarterectomy with Dacron patch angioplasty. He comes in today to establish vascular follow up.  She briefly left [place] and moved to the [facility]. She now returns to [facility] and wishes to continue vascular follow up.   She is right-handed. She denies any history of stroke, TIAs, expressive or receptive aphasia, or amaurosis fugax.  She does experience some bilateral thigh claudication when she is walking uphill. She denies any history of rest pain or nonhealing ulcers.   Past Medical History  Diagnosis Date  . Hypertension   . Anxiety     on Alprazolam  . Depression     on Effexor  . Diabetes mellitus without complication     4496  . Heart murmur     was seen by cardiologist in [facility] 2013  . Allergy   . Atrial fibrillation   . Peripheral vascular disease    Family History  Problem Relation Age of Onset  . Diabetes Mother   . Heart disease Mother   . Hyperlipidemia Mother   . Hypertension Mother   . Heart disease Father   . Hyperlipidemia Father   . Hypertension Father   . Diabetes Maternal Grandmother   . Heart disease Maternal Grandmother   . Hyperlipidemia Maternal Grandmother   . Cancer Paternal Grandmother   . Cancer Sister   . Varicose Veins Sister   . Heart attack Sister    SOCIAL HISTORY: History  Substance Use Topics  . Smoking status: Former Smoker    Quit date:  12/22/2010  . Smokeless tobacco: Not on file  . Alcohol Use: Yes     Comment: holidays/special occas 3-4 drinks   Allergies  Allergen Reactions  . Other     Contrast dye    Current Outpatient Prescriptions  Medication Sig Dispense Refill  . ALPRAZolam (XANAX) 0.5 MG tablet Take 1 tablet (0.5 mg total) by mouth 3 (three) times daily as needed for anxiety. Can only refill after every 28 days  90 tablet  3  . amLODipine (NORVASC) 10 MG tablet Take 1 tablet (10 mg total) by mouth daily.  90 tablet  1  . aspirin 325 MG tablet Take 325 mg by mouth daily.      . clopidogrel (PLAVIX) 75 MG tablet Take 1 tablet (75 mg total) by mouth daily with breakfast.  90 tablet  0  . fenofibrate 54 MG tablet Take 1 tablet (54 mg total) by mouth daily.  90 tablet  1  . glipiZIDE (GLUCOTROL XL) 10 MG 24 hr tablet Take 1 tablet (10 mg total) by mouth daily with breakfast.  90 tablet  1  . simvastatin (ZOCOR) 10 MG tablet Take 1 tablet (10 mg total) by mouth at bedtime.  90 tablet  0  . venlafaxine XR (EFFEXOR-XR) 150 MG 24 hr capsule Take 1 capsule (150 mg total) by mouth daily with breakfast.  90 capsule  1  . metFORMIN (GLUCOPHAGE XR) 750 MG 24 hr tablet Take 1 tablet (750 mg total) by mouth daily with breakfast.  90 tablet  1   No current facility-administered medications for this visit.   REVIEW OF SYSTEMS: Valu.Nieves ] denotes positive finding; [  ] denotes negative finding  CARDIOVASCULAR:  [ ]  chest pain   [ ]  chest pressure   [ ]  palpitations   [ ]  orthopnea   [ ]  dyspnea on exertion   [ ]  claudication   [ ]  rest pain   [ ]  DVT   [ ]  phlebitis PULMONARY:   [ ]  productive cough   [ ]  asthma   [ ]  wheezing NEUROLOGIC:   [ ]  weakness  [ ]  paresthesias  [ ]  aphasia  [ ]  amaurosis  [ ]  dizziness HEMATOLOGIC:   [ ]  bleeding problems   [ ]  clotting disorders MUSCULOSKELETAL:  [ ]  joint pain   [ ]  joint swelling [ ]  leg swelling GASTROINTESTINAL: [ ]   blood in stool  [ ]   hematemesis GENITOURINARY:  [ ]   dysuria  [  ]  hematuria PSYCHIATRIC:  Valu.Nieves ] history of major depression INTEGUMENTARY:  [ ]  rashes  [ ]  ulcers CONSTITUTIONAL:  [ ]  fever   [ ]  chills  PHYSICAL EXAM: Filed Vitals:   04/12/14 1104  BP: 141/78  Pulse: 70  Height: 5\' 4"  (1.626 m)  Weight: 168 lb (76.204 kg)  SpO2: 100%   Body mass index is 28.82 kg/(m^2). GENERAL: The patient is a well-nourished female, in no acute distress. The vital signs are documented above. CARDIOVASCULAR: There is a regular rate and rhythm. I do not detect carotid bruits. She has palpable femoral pulses. Both feet are warm and well-perfused. She has no ischemic ulcers. She has no significant lower extremity swelling. PULMONARY: There is good air exchange bilaterally without wheezing or rales. ABDOMEN: Soft and non-tender with normal pitched bowel sounds.  MUSCULOSKELETAL: There are no major deformities or cyanosis. NEUROLOGIC: No focal weakness or paresthesias are detected. SKIN: There are no ulcers or rashes noted. PSYCHIATRIC: The patient has a normal affect.  DATA:  I have independently interpreted her arterial Doppler study today which shows ABIs of 100% bilaterally. She has also had a duplex which I interpreted which shows a widely patent fem-fem graft. The right common iliac artery stent is widely patent.  I've also independently interpreted her carotid duplex scan. This shows that the right internal carotid artery and right common carotid artery are occluded. She is a 60-79% left carotid stenosis although this may be falsely elevated because of a contralateral occlusion.  MEDICAL ISSUES:  Occlusion and stenosis of carotid artery without mention of cerebral infarction This patient has a recurrent left carotid stenosis is likely in the lower range of the 60-79% category. She is on Plavix. She is on aspirin. She is on Zocor. I've recommended a follow up carotid duplex scan in 6 months and I'll see her back at that time. We would not consider redo left  carotid endarterectomy she became symptomatic with the stenosis progressed to greater than 80%.  PVD (peripheral vascular disease) Sh I've ordered followed ABIs in the duplex in one year now see her back at that time.e has a mild thigh claudication bilaterally which is stable. Her right common iliac artery stent is patent and her fem-fem bypass graft is patent. I have encouraged her to stay as active as possible. She is on aspirin and Plavix. She is  on a statin.   Angelia Mould [facility] [place] [facility]: [phone]

## 2014-04-12 NOTE — Assessment & Plan Note (Signed)
This patient has a recurrent left carotid stenosis is likely in the lower range of the 60-79% category. She is on Plavix. She is on aspirin. She is on Zocor. I've recommended a follow up carotid duplex scan in 6 months and I'll see her back at that time. We would not consider redo left carotid endarterectomy she became symptomatic with the stenosis progressed to greater than 80%.

## 2014-06-09 ENCOUNTER — Encounter: Payer: Self-pay | Admitting: Family Medicine

## 2014-06-09 ENCOUNTER — Ambulatory Visit (INDEPENDENT_AMBULATORY_CARE_PROVIDER_SITE_OTHER): Payer: Medicare PPO | Admitting: Family Medicine

## 2014-06-09 VITALS — BP 134/70 | HR 60 | Temp 98.5°F | Resp 16 | Ht 63.5 in | Wt 164.2 lb

## 2014-06-09 DIAGNOSIS — E119 Type 2 diabetes mellitus without complications: Secondary | ICD-10-CM

## 2014-06-09 DIAGNOSIS — F341 Dysthymic disorder: Secondary | ICD-10-CM

## 2014-06-09 DIAGNOSIS — I70209 Unspecified atherosclerosis of native arteries of extremities, unspecified extremity: Secondary | ICD-10-CM

## 2014-06-09 DIAGNOSIS — F32A Depression, unspecified: Secondary | ICD-10-CM

## 2014-06-09 DIAGNOSIS — I10 Essential (primary) hypertension: Secondary | ICD-10-CM

## 2014-06-09 DIAGNOSIS — E1159 Type 2 diabetes mellitus with other circulatory complications: Secondary | ICD-10-CM

## 2014-06-09 DIAGNOSIS — E785 Hyperlipidemia, unspecified: Secondary | ICD-10-CM

## 2014-06-09 DIAGNOSIS — F419 Anxiety disorder, unspecified: Secondary | ICD-10-CM

## 2014-06-09 DIAGNOSIS — F329 Major depressive disorder, single episode, unspecified: Secondary | ICD-10-CM

## 2014-06-09 LAB — POCT GLYCOSYLATED HEMOGLOBIN (HGB A1C): Hemoglobin A1C: 7.3

## 2014-06-09 MED ORDER — VENLAFAXINE HCL ER 150 MG PO CP24: 150.0000 mg | ORAL_CAPSULE | Freq: Every day | ORAL | Status: DC

## 2014-06-09 MED ORDER — GLIPIZIDE ER 10 MG PO TB24: 10.0000 mg | ORAL_TABLET | Freq: Every day | ORAL | Status: DC

## 2014-06-09 MED ORDER — AMLODIPINE BESYLATE 10 MG PO TABS: 10.0000 mg | ORAL_TABLET | Freq: Every day | ORAL | Status: DC

## 2014-06-09 MED ORDER — CLOPIDOGREL BISULFATE 75 MG PO TABS: 75.0000 mg | ORAL_TABLET | Freq: Every day | ORAL | Status: DC

## 2014-06-09 MED ORDER — SIMVASTATIN 10 MG PO TABS: 10.0000 mg | ORAL_TABLET | Freq: Every day | ORAL | Status: DC

## 2014-06-09 MED ORDER — ALPRAZOLAM 0.5 MG PO TABS
0.5000 mg | ORAL_TABLET | Freq: Three times a day (TID) | ORAL | Status: DC | PRN
Start: 2014-06-09 — End: 2014-09-22

## 2014-06-09 MED ORDER — FENOFIBRATE 54 MG PO TABS: 54.0000 mg | ORAL_TABLET | Freq: Every day | ORAL | Status: DC

## 2014-06-09 NOTE — Progress Notes (Signed)
Chief Complaint:  Chief Complaint  Patient presents with  . Follow-up    MEDICATIONS AND REFILLS ON ALL PRESCRIPTIONS    HPI: Andrea [name] is a 48 y.o. female who is here for all med refills She is still having high sugar 140-296, fasting She denies neuropathy She denies any SEs from medds She is taking metformin ER and also glipizide 10 mg daily. She used to take glipizide 10 mg BID with metfromin once daily.  Denies any hypoglycemia PMH of HTN, Hyperlipdiemia, CAD, PAD Denies CP, SOB Depression and anxiety under control /stable with meds.Denies SI/HI  Past Medical History  Diagnosis Date  . Hypertension   . Anxiety     on Alprazolam  . Depression     on Effexor  . Diabetes mellitus without complication     6629  . Heart murmur     was seen by cardiologist in [facility] 2013  . Allergy   . Atrial fibrillation   . Peripheral vascular disease    Past Surgical History  Procedure Laterality Date  . Cholecystectomy    . Cesarean section    . Tubal ligation    . Femoral bypass      x 2 on right side due to re-stenosis  . Left/right carotid artery      2003 and 2009 for right, right side is 100% occluded per patient; left side 2009  . 2 stents    . Carotid endarterectomy     History   Social History  . Marital Status: Married    Spouse Name: N/A    Number of Children: N/A  . Years of Education: N/A   Social History Main Topics  . Smoking status: Former Smoker    Quit date: 12/22/2010  . Smokeless tobacco: None  . Alcohol Use: Yes     Comment: holidays/special occas 3-4 drinks  . Drug Use: No  . Sexual Activity: None   Other Topics Concern  . None   Social History Narrative  . None   Family History  Problem Relation Age of Onset  . Diabetes Mother   . Heart disease Mother   . Hyperlipidemia Mother   . Hypertension Mother   . Heart disease Father   . Hyperlipidemia Father   . Hypertension Father   . Diabetes Maternal Grandmother     . Heart disease Maternal Grandmother   . Hyperlipidemia Maternal Grandmother   . Cancer Paternal Grandmother   . Cancer Sister   . Varicose Veins Sister   . Heart attack Sister    Allergies  Allergen Reactions  . Other Hives    Contrast dye    Prior to Admission medications   Medication Sig Start Date End Date Taking? Authorizing Provider  ALPRAZolam Duanne Moron) 0.5 MG tablet Take 1 tablet (0.5 mg total) by mouth 3 (three) times daily as needed for anxiety. Can only refill after every 28 days 03/10/14  Yes Thao P Le, DO  amLODipine (NORVASC) 10 MG tablet Take 1 tablet (10 mg total) by mouth daily. 02/10/14  Yes Thao P Le, DO  aspirin 325 MG tablet Take 325 mg by mouth daily.   Yes Historical Provider, MD  clopidogrel (PLAVIX) 75 MG tablet Take 1 tablet (75 mg total) by mouth daily with breakfast. 02/10/14  Yes Thao P Le, DO  fenofibrate 54 MG tablet Take 1 tablet (54 mg total) by mouth daily. 03/10/14  Yes Thao P Le, DO  glipiZIDE (GLUCOTROL XL) 10  MG 24 hr tablet Take 1 tablet (10 mg total) by mouth daily with breakfast. 02/10/14  Yes Thao P Le, DO  metFORMIN (GLUCOPHAGE XR) 750 MG 24 hr tablet Take 1 tablet (750 mg total) by mouth daily with breakfast. 03/10/14  Yes Thao P Le, DO  simvastatin (ZOCOR) 10 MG tablet Take 1 tablet (10 mg total) by mouth at bedtime. 03/14/14  Yes Thao P Le, DO  venlafaxine XR (EFFEXOR-XR) 150 MG 24 hr capsule Take 1 capsule (150 mg total) by mouth daily with breakfast. 02/10/14  Yes Thao P Le, DO     ROS: The patient denies fevers, chills, night sweats, unintentional weight loss, chest pain, palpitations, wheezing, dyspnea on exertion, nausea, vomiting, abdominal pain, dysuria, hematuria, melena, numbness, weakness, or tingling.   All other systems have been reviewed and were otherwise negative with the exception of those mentioned in the HPI and as above.    PHYSICAL EXAM: Filed Vitals:   06/09/14 1045  BP: 134/70  Pulse: 60  Temp: 98.5 F (36.9 C)  Resp:  16   Filed Vitals:   06/09/14 1045  Height: 5' 3.5" (1.613 m)  Weight: 164 lb 3.2 oz (74.481 kg)   Body mass index is 28.63 kg/(m^2).  General: Alert, no acute distress HEENT:  Normocephalic, atraumatic, oropharynx patent. EOMI, PERRLA Cardiovascular:  Regular rate and rhythm, no rubs murmurs or gallops.  No Carotid bruits, radial pulse intact. No pedal edema.  Respiratory: Clear to auscultation bilaterally.  No wheezes, rales, or rhonchi.  No cyanosis, no use of accessory musculature GI: No organomegaly, abdomen is soft and non-tender, positive bowel sounds.  No masses. Skin: No rashes. Neurologic: Facial musculature symmetric. Psychiatric: Patient is appropriate throughout our interaction. Lymphatic: No cervical lymphadenopathy Musculoskeletal: Gait intact.   LABS: Results for orders placed in visit on 06/09/14  COMPLETE METABOLIC PANEL WITH GFR      Result Value Ref Range   Sodium 142  135 - 145 mEq/L   Potassium 4.5  3.5 - 5.3 mEq/L   Chloride 104  96 - 112 mEq/L   CO2 29  19 - 32 mEq/L   Glucose, Bld 116 (*) 70 - 99 mg/dL   BUN 8  6 - 23 mg/dL   Creat 0.62  0.50 - 1.10 mg/dL   Total Bilirubin 0.4  0.2 - 1.2 mg/dL   Alkaline Phosphatase 74  39 - 117 U/L   AST 31  0 - 37 U/L   ALT 50 (*) 0 - 35 U/L   Total Protein 7.2  6.0 - 8.3 g/dL   Albumin 4.7  3.5 - 5.2 g/dL   Calcium 10.3  8.4 - 10.5 mg/dL   GFR, Est African American >89     GFR, Est Non African American >89    LIPID PANEL      Result Value Ref Range   Cholesterol 155  0 - 200 mg/dL   Triglycerides 136  <150 mg/dL   HDL 41  >39 mg/dL   Total CHOL/HDL Ratio 3.8     VLDL 27  0 - 40 mg/dL   LDL Cholesterol 87  0 - 99 mg/dL  POCT GLYCOSYLATED HEMOGLOBIN (HGB A1C)      Result Value Ref Range   Hemoglobin A1C 7.3       EKG/XRAY:   Primary read interpreted by Dr. Marin Comment at [facility].   ASSESSMENT/PLAN: Encounter Diagnoses  Name Primary?  . 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 Other and unspecified hyperlipidemia   . Essential hypertension   . Anxiety and depression   . Type II or unspecified type diabetes mellitus without mention of complication, not stated as uncontrolled   . Hyperlipidemia   . Atherosclerotic peripheral vascular disease    Refill all meds  I do not think she need to be on metfromin and glipizide 10 mg BId, although she states that was what she was on before she established care here I will go ahead and change her metfromin to BID and keep her glipizide daily so she can get her A1c less than 6 since she has HTN, DM, CAD, hyperlipidemia, and PAD without any hypoglycemia effects F/u in 3 months Recommend : ADA diet, BP goal <140/90, daily foot exams, tobacco cessation if smoking, annual eye exam, annual flu vaccine, PNA vaccine if age and time appropriate.   Gross sideeffects, risk and benefits, and alternatives of medications d/w patient. Patient is aware that all medications have potential sideeffects and we are unable to predict every sideeffect or drug-drug interaction that may occur.  LE, [place], DO 06/10/2014 10:22 AM

## 2014-06-10 LAB — LIPID PANEL
Cholesterol: 155 mg/dL (ref 0–200)
HDL: 41 mg/dL (ref >39)
LDL Cholesterol: 87 mg/dL (ref 0–99)
Total CHOL/HDL Ratio: 3.8 Ratio
Triglycerides: 136 mg/dL (ref <150)
VLDL: 27 mg/dL (ref 0–40)

## 2014-06-10 LAB — COMPLETE METABOLIC PANEL WITHOUT GFR
ALT: 50 U/L — ABNORMAL HIGH (ref 0–35)
CO2: 29 meq/L (ref 19–32)
Calcium: 10.3 mg/dL (ref 8.4–10.5)
Chloride: 104 meq/L (ref 96–112)
GFR, Est African American: >89 mL/min
GFR, Est Non African American: >89 mL/min
Glucose, Bld: 116 mg/dL — ABNORMAL HIGH (ref 70–99)
Sodium: 142 meq/L (ref 135–145)
Total Protein: 7.2 g/dL (ref 6.0–8.3)

## 2014-06-10 LAB — COMPLETE METABOLIC PANEL WITH GFR
AST: 31 U/L (ref 0–37)
Albumin: 4.7 g/dL (ref 3.5–5.2)
Alkaline Phosphatase: 74 U/L (ref 39–117)
BUN: 8 mg/dL (ref 6–23)
Creat: 0.62 mg/dL (ref 0.50–1.10)
Potassium: 4.5 mEq/L (ref 3.5–5.3)
Total Bilirubin: 0.4 mg/dL (ref 0.2–1.2)

## 2014-06-10 MED ORDER — METFORMIN HCL ER 750 MG PO TB24: 750.0000 mg | ORAL_TABLET | Freq: Every day | ORAL | Status: DC

## 2014-06-10 MED ORDER — METFORMIN HCL ER 750 MG PO TB24: 750.0000 mg | ORAL_TABLET | Freq: Two times a day (BID) | ORAL | Status: DC

## 2014-06-18 ENCOUNTER — Encounter: Payer: Self-pay | Admitting: Family Medicine

## 2014-06-22 ENCOUNTER — Encounter (HOSPITAL_COMMUNITY): Payer: Self-pay | Admitting: *Deleted

## 2014-06-22 ENCOUNTER — Inpatient Hospital Stay (HOSPITAL_COMMUNITY): Payer: Medicare FFS

## 2014-06-22 ENCOUNTER — Emergency Department (HOSPITAL_COMMUNITY): Payer: Medicare FFS

## 2014-06-22 ENCOUNTER — Inpatient Hospital Stay (HOSPITAL_COMMUNITY)
Admission: EM | Admit: 2014-06-22 | Discharge: 2014-06-24 | DRG: 066 | Disposition: A | Discharge status: Home / Self Care | Payer: Medicare FFS | Attending: Neurology | Admitting: Neurology

## 2014-06-22 DIAGNOSIS — Z87891 Personal history of nicotine dependence: Secondary | ICD-10-CM

## 2014-06-22 DIAGNOSIS — F411 Generalized anxiety disorder: Secondary | ICD-10-CM | POA: Diagnosis present

## 2014-06-22 DIAGNOSIS — Z006 Encounter for examination for normal comparison and control in clinical research program: Secondary | ICD-10-CM

## 2014-06-22 DIAGNOSIS — I6529 Occlusion and stenosis of unspecified carotid artery: Secondary | ICD-10-CM | POA: Diagnosis present

## 2014-06-22 DIAGNOSIS — I639 Cerebral infarction, unspecified: Secondary | ICD-10-CM

## 2014-06-22 DIAGNOSIS — E876 Hypokalemia: Secondary | ICD-10-CM

## 2014-06-22 DIAGNOSIS — F3289 Other specified depressive episodes: Secondary | ICD-10-CM | POA: Diagnosis present

## 2014-06-22 DIAGNOSIS — E119 Type 2 diabetes mellitus without complications: Secondary | ICD-10-CM | POA: Diagnosis present

## 2014-06-22 DIAGNOSIS — Z7982 Long term (current) use of aspirin: Secondary | ICD-10-CM | POA: Diagnosis not present

## 2014-06-22 DIAGNOSIS — Z79899 Other long term (current) drug therapy: Secondary | ICD-10-CM

## 2014-06-22 DIAGNOSIS — I10 Essential (primary) hypertension: Secondary | ICD-10-CM | POA: Diagnosis present

## 2014-06-22 DIAGNOSIS — I739 Peripheral vascular disease, unspecified: Secondary | ICD-10-CM

## 2014-06-22 DIAGNOSIS — I658 Occlusion and stenosis of other precerebral arteries: Secondary | ICD-10-CM | POA: Diagnosis present

## 2014-06-22 DIAGNOSIS — E785 Hyperlipidemia, unspecified: Secondary | ICD-10-CM

## 2014-06-22 DIAGNOSIS — I6350 Cerebral infarction due to unspecified occlusion or stenosis of unspecified cerebral artery: Secondary | ICD-10-CM | POA: Diagnosis not present

## 2014-06-22 DIAGNOSIS — R42 Dizziness and giddiness: Secondary | ICD-10-CM | POA: Diagnosis not present

## 2014-06-22 DIAGNOSIS — E669 Obesity, unspecified: Secondary | ICD-10-CM | POA: Diagnosis present

## 2014-06-22 DIAGNOSIS — Z6828 Body mass index (BMI) 28.0-28.9, adult: Secondary | ICD-10-CM | POA: Diagnosis not present

## 2014-06-22 DIAGNOSIS — I2510 Atherosclerotic heart disease of native coronary artery without angina pectoris: Secondary | ICD-10-CM | POA: Diagnosis present

## 2014-06-22 DIAGNOSIS — Z7902 Long term (current) use of antithrombotics/antiplatelets: Secondary | ICD-10-CM | POA: Diagnosis not present

## 2014-06-22 DIAGNOSIS — F329 Major depressive disorder, single episode, unspecified: Secondary | ICD-10-CM | POA: Diagnosis present

## 2014-06-22 HISTORY — DX: Cerebral infarction, unspecified: I63.9

## 2014-06-22 LAB — DIFFERENTIAL
Basophils Absolute: 0.1 10*3/uL (ref 0.0–0.1)
Basophils Relative: 1 % (ref 0–1)
Eosinophils Absolute: 0.1 10*3/uL (ref 0.0–0.7)
Eosinophils Relative: 2 % (ref 0–5)
LYMPHS ABS: 2.7 10*3/uL (ref 0.7–4.0)
Lymphocytes Relative: 37 % (ref 12–46)
Monocytes Absolute: 0.6 10*3/uL (ref 0.1–1.0)
Monocytes Relative: 8 % (ref 3–12)
NEUTROS ABS: 3.7 10*3/uL (ref 1.7–7.7)
NEUTROS PCT: 52 % (ref 43–77)

## 2014-06-22 LAB — CBC
HCT: 40.3 % (ref 36.0–46.0)
HEMOGLOBIN: 13.7 g/dL (ref 12.0–15.0)
MCH: 29.7 pg (ref 26.0–34.0)
MCHC: 34 g/dL (ref 30.0–36.0)
MCV: 87.2 fL (ref 78.0–100.0)
Platelets: 290 10*3/uL (ref 150–400)
RBC: 4.62 MIL/uL (ref 3.87–5.11)
RDW: 13.7 % (ref 11.5–15.5)
WBC: 7.2 10*3/uL (ref 4.0–10.5)

## 2014-06-22 LAB — I-STAT TROPONIN, ED: Troponin i, poc: 0 ng/mL (ref 0.00–0.08)

## 2014-06-22 LAB — PROTIME-INR
INR: 0.97 (ref 0.00–1.49)
Prothrombin Time: 12.9 seconds (ref 11.6–15.2)

## 2014-06-22 LAB — I-STAT CHEM 8, ED
BUN: 9 mg/dL (ref 6–23)
CHLORIDE: 103 meq/L (ref 96–112)
CREATININE: 0.6 mg/dL (ref 0.50–1.10)
Calcium, Ion: 1.21 mmol/L (ref 1.12–1.23)
Glucose, Bld: 262 mg/dL — ABNORMAL HIGH (ref 70–99)
HCT: 44 % (ref 36.0–46.0)
Hemoglobin: 15 g/dL (ref 12.0–15.0)
POTASSIUM: 3.5 meq/L — AB (ref 3.7–5.3)
Sodium: 139 mEq/L (ref 137–147)
TCO2: 23 mmol/L (ref 0–100)

## 2014-06-22 LAB — COMPREHENSIVE METABOLIC PANEL
ALBUMIN: 4.2 g/dL (ref 3.5–5.2)
ALK PHOS: 93 U/L (ref 39–117)
ALT: 47 U/L — AB (ref 0–35)
ANION GAP: 17 — AB (ref 5–15)
AST: 28 U/L (ref 0–37)
BILIRUBIN TOTAL: 0.3 mg/dL (ref 0.3–1.2)
BUN: 11 mg/dL (ref 6–23)
CHLORIDE: 98 meq/L (ref 96–112)
CO2: 24 mEq/L (ref 19–32)
Calcium: 10.2 mg/dL (ref 8.4–10.5)
Creatinine, Ser: 0.52 mg/dL (ref 0.50–1.10)
GFR calc Af Amer: >90 mL/min (ref >90)
GFR calc non Af Amer: >90 mL/min (ref >90)
GLUCOSE: 262 mg/dL — AB (ref 70–99)
POTASSIUM: 3.7 meq/L (ref 3.7–5.3)
SODIUM: 139 meq/L (ref 137–147)
Total Protein: 7.5 g/dL (ref 6.0–8.3)

## 2014-06-22 LAB — GLUCOSE, CAPILLARY
Glucose-Capillary: 169 mg/dL — ABNORMAL HIGH (ref 70–99)
Glucose-Capillary: 230 mg/dL — ABNORMAL HIGH (ref 70–99)

## 2014-06-22 LAB — MRSA PCR SCREENING: MRSA by PCR: NEGATIVE

## 2014-06-22 LAB — CBG MONITORING, ED: Glucose-Capillary: 235 mg/dL — ABNORMAL HIGH (ref 70–99)

## 2014-06-22 LAB — APTT: APTT: 26 s (ref 24–37)

## 2014-06-22 MED ORDER — ACETAMINOPHEN 650 MG RE SUPP: 650.0000 mg | RECTAL | Status: DC | PRN

## 2014-06-22 MED ORDER — STROKE: EARLY STAGES OF RECOVERY BOOK
Freq: Once | Status: AC
Start: 2014-06-22 — End: 2014-06-22
  Administered 2014-06-22: 18:00:00
  Filled 2014-06-22: qty 1

## 2014-06-22 MED ORDER — SIMVASTATIN 5 MG PO TABS
10.0000 mg | ORAL_TABLET | Freq: Every day | ORAL | Status: DC
  Administered 2014-06-22 – 2014-06-23 (×2): 10 mg via ORAL
  Filled 2014-06-22 (×2): qty 1
  Filled 2014-06-22: qty 2

## 2014-06-22 MED ORDER — PANTOPRAZOLE SODIUM 40 MG IV SOLR: 40.0000 mg | Freq: Every day | INTRAVENOUS | Status: DC

## 2014-06-22 MED ORDER — VENLAFAXINE HCL ER 75 MG PO CP24
150.0000 mg | ORAL_CAPSULE | Freq: Every day | ORAL | Status: DC
  Administered 2014-06-23 – 2014-06-24 (×2): 150 mg via ORAL
  Filled 2014-06-22 (×2): qty 1
  Filled 2014-06-22: qty 2

## 2014-06-22 MED ORDER — FENOFIBRATE 54 MG PO TABS
54.0000 mg | ORAL_TABLET | Freq: Every day | ORAL | Status: DC
  Administered 2014-06-23 – 2014-06-24 (×2): 54 mg via ORAL
  Filled 2014-06-22 (×3): qty 1

## 2014-06-22 MED ORDER — ACETAMINOPHEN 325 MG PO TABS
650.0000 mg | ORAL_TABLET | ORAL | Status: DC | PRN
  Administered 2014-06-22: 650 mg via ORAL
  Filled 2014-06-22: qty 2

## 2014-06-22 MED ORDER — SODIUM CHLORIDE 0.9 % IV SOLN
INTRAVENOUS | Status: DC
  Administered 2014-06-22: 15:00:00 via INTRAVENOUS

## 2014-06-22 MED ORDER — PANTOPRAZOLE SODIUM 40 MG IV SOLR
40.0000 mg | Freq: Every day | INTRAVENOUS | Status: DC
  Administered 2014-06-22: 40 mg via INTRAVENOUS
  Filled 2014-06-22: qty 40

## 2014-06-22 MED ORDER — SODIUM CHLORIDE 0.9 % IV SOLN
INTRAVENOUS | Status: DC
  Administered 2014-06-23: 75 mL/h via INTRAVENOUS

## 2014-06-22 MED ORDER — AMLODIPINE BESYLATE 10 MG PO TABS
10.0000 mg | ORAL_TABLET | Freq: Every day | ORAL | Status: DC
  Filled 2014-06-22: qty 2

## 2014-06-22 MED ORDER — GLIPIZIDE ER 10 MG PO TB24
10.0000 mg | ORAL_TABLET | Freq: Every day | ORAL | Status: DC
  Administered 2014-06-23 – 2014-06-24 (×2): 10 mg via ORAL
  Filled 2014-06-22 (×3): qty 1

## 2014-06-22 MED ORDER — ALPRAZOLAM 0.5 MG PO TABS: 0.5000 mg | ORAL_TABLET | Freq: Three times a day (TID) | ORAL | Status: DC | PRN

## 2014-06-22 MED ORDER — STUDY - INVESTIGATIONAL DRUG SIMPLE RECORD
325.0000 mg | Freq: Once | Status: AC
  Administered 2014-06-22: 325 mg via ORAL
  Filled 2014-06-22: qty 325

## 2014-06-22 MED ORDER — LABETALOL HCL 5 MG/ML IV SOLN: 10.0000 mg | INTRAVENOUS | Status: DC | PRN

## 2014-06-22 MED ORDER — STUDY - INVESTIGATIONAL DRUG SIMPLE RECORD
67.0000 mg | Status: AC
  Administered 2014-06-22: 67 mg via INTRAVENOUS
  Filled 2014-06-22: qty 67

## 2014-06-22 MED ORDER — SENNOSIDES-DOCUSATE SODIUM 8.6-50 MG PO TABS
1.0000 | ORAL_TABLET | Freq: Every evening | ORAL | Status: DC | PRN
  Filled 2014-06-22: qty 1

## 2014-06-22 MED ORDER — SENNOSIDES-DOCUSATE SODIUM 8.6-50 MG PO TABS: 1.0000 | ORAL_TABLET | Freq: Every evening | ORAL | Status: DC | PRN

## 2014-06-22 MED ORDER — AMLODIPINE BESYLATE 10 MG PO TABS
10.0000 mg | ORAL_TABLET | Freq: Every day | ORAL | Status: DC
  Administered 2014-06-23 – 2014-06-24 (×2): 10 mg via ORAL
  Filled 2014-06-22 (×2): qty 1

## 2014-06-22 MED ORDER — STROKE: EARLY STAGES OF RECOVERY BOOK
Freq: Once | Status: DC
Start: 2014-06-22 — End: 2014-06-22
  Filled 2014-06-22: qty 1

## 2014-06-22 MED ORDER — ACETAMINOPHEN 325 MG PO TABS: 650.0000 mg | ORAL_TABLET | ORAL | Status: DC | PRN

## 2014-06-22 MED ORDER — METFORMIN HCL ER 500 MG PO TB24
750.0000 mg | ORAL_TABLET | Freq: Two times a day (BID) | ORAL | Status: DC
  Administered 2014-06-22 – 2014-06-24 (×4): 750 mg via ORAL
  Filled 2014-06-22 (×4): qty 1

## 2014-06-22 NOTE — Code Documentation (Addendum)
Woke up around 0845, felt tired and went back to bed and slept until 1100.  This is very unusual for the pt.  When she got up at 1100 see got a cup of coffee and went to work at her computer.  At 1145 she noted dizziness and unable to type with L hand. Tried to get up, but fell into floor and was unable to speak (words were jibberish). Husband and son assisted her to the car and drove her to the hospital. They did however go through the [employer] drive thru pt's insistence. On arrival to [facility], ED RN activated code stroke. CT scan showed no acute abnormality. Sx have greatly improved; NIHSS is 2 (see doc flowsheet). Dr. Leonie Man contacted for possible enrollment into Prisms trial.Pt agreed. Wt is 163.7 lbs. Research nurse notified pharmacy. 2nd IV started (and study drug administered at 1433. VS/neuro cks stable. Pt continues to improve. Handoff done with ED RN. Family/pt updated on what to expect:  ICU for 24 hrs, freq VS/neuro cks, MRI tomorrow. The state understanding and are agreeable with plan.

## 2014-06-22 NOTE — Plan of Care (Signed)
Problem: tPA Day Progression Outcomes-Only if tPA administered Goal: Post tPA VS, neuro checks Outcome: Progressing 06/22/14 - Pt in PRISM study following TPA protocol - vital signs in progress

## 2014-06-22 NOTE — ED Notes (Signed)
Attempted to call report x 1  

## 2014-06-22 NOTE — ED Provider Notes (Signed)
CSN: [id number]     Arrival date & time 06/22/14  1255 History   None    Chief Complaint  Patient presents with  . Stroke Symptoms     (Consider location/radiation/quality/duration/timing/severity/associated sxs/prior Treatment) HPI  Andrea [name] is a 48 y.o. female who is here for evaluation of weakness. This started suddenly at 11:45 AM today. This caused her to slip out of the chair, onto the floor. She was unable to move the left side of her body. She did not injure herself. She also has a sensation of numbness in her left arm. She reports that her speech was slurred for a 5 minute period. She denies a headache or prior similar symptoms. She denies recent fever, chills, or cough. There are no other known modifying factors.   Past Medical History  Diagnosis Date  . Hypertension   . Anxiety     on Alprazolam  . Depression     on Effexor  . Diabetes mellitus without complication     1324  . Heart murmur     was seen by cardiologist in [facility] 2013  . Allergy   . Atrial fibrillation   . Peripheral vascular disease    Past Surgical History  Procedure Laterality Date  . Cholecystectomy    . Cesarean section    . Tubal ligation    . Femoral bypass      x 2 on right side due to re-stenosis  . Left/right carotid artery      2003 and 2009 for right, right side is 100% occluded per patient; left side 2009  . 2 stents    . Carotid endarterectomy     Family History  Problem Relation Age of Onset  . Diabetes Mother   . Heart disease Mother   . Hyperlipidemia Mother   . Hypertension Mother   . Heart disease Father   . Hyperlipidemia Father   . Hypertension Father   . Diabetes Maternal Grandmother   . Heart disease Maternal Grandmother   . Hyperlipidemia Maternal Grandmother   . Cancer Paternal Grandmother   . Cancer Sister   . Varicose Veins Sister   . Heart attack Sister    History  Substance Use Topics  . Smoking status: Former Smoker    Quit date:  12/22/2010  . Smokeless tobacco: Not on file  . Alcohol Use: Yes     Comment: holidays/special occas 3-4 drinks   OB History   Grav Para Term Preterm Abortions TAB SAB Ect Mult Living                 Review of Systems  All other systems reviewed and are negative.     Allergies  Other  Home Medications   Prior to Admission medications   Medication Sig Start Date End Date Taking? Authorizing Provider  ALPRAZolam Duanne Moron) 0.5 MG tablet Take 1 tablet (0.5 mg total) by mouth 3 (three) times daily as needed for anxiety. Can only refill after every 28 days 06/09/14  Yes Thao P Le, DO  amLODipine (NORVASC) 10 MG tablet Take 1 tablet (10 mg total) by mouth daily. 06/09/14  Yes Thao P Le, DO  aspirin 325 MG tablet Take 325 mg by mouth daily.   Yes Historical Provider, MD  clopidogrel (PLAVIX) 75 MG tablet Take 1 tablet (75 mg total) by mouth daily with breakfast. 06/09/14  Yes Thao P Le, DO  fenofibrate 54 MG tablet Take 1 tablet (54 mg total) by mouth daily.  06/09/14  Yes Thao P Le, DO  glipiZIDE (GLUCOTROL XL) 10 MG 24 hr tablet Take 1 tablet (10 mg total) by mouth daily with breakfast. 06/09/14  Yes Thao P Le, DO  metFORMIN (GLUCOPHAGE XR) 750 MG 24 hr tablet Take 1 tablet (750 mg total) by mouth 2 (two) times daily with a meal. New dose frequency 06/10/14  Yes Thao P Le, DO  simvastatin (ZOCOR) 10 MG tablet Take 1 tablet (10 mg total) by mouth at bedtime. 06/09/14  Yes Thao P Le, DO  venlafaxine XR (EFFEXOR-XR) 150 MG 24 hr capsule Take 1 capsule (150 mg total) by mouth daily with breakfast. 06/09/14  Yes Thao P Le, DO   BP 116/96  Pulse 81  Temp(Src) 98.8 F (37.1 C) (Oral)  Resp 17  Wt 163 lb 11.2 oz (74.254 kg)  SpO2 99% Physical Exam  Nursing note and vitals reviewed. Constitutional: She is oriented to person, place, and time. She appears well-developed and well-nourished.  HENT:  Head: Normocephalic and atraumatic.  Eyes: Conjunctivae and EOM are normal. Pupils are equal, round,  and reactive to light.  Neck: Normal range of motion and phonation normal. Neck supple.  Cardiovascular: Normal rate.   Pulmonary/Chest: Effort normal.  Musculoskeletal: Normal range of motion.  Neurological: She is alert and oriented to person, place, and time. She exhibits normal muscle tone.  Decreased light touch, left hand.  Skin: Skin is warm and dry.  Psychiatric: She has a normal mood and affect. Her behavior is normal. Judgment and thought content normal.    ED Course  Procedures (including critical care time)  Medications  PRISMS study - Placebo / aspirin  (PI-Sethi) (not administered)  0.9 %  sodium chloride infusion ( Intravenous New Bag/Given 06/22/14 1433)  ALPRAZolam (XANAX) tablet 0.5 mg (not administered)  amLODipine (NORVASC) tablet 10 mg (not administered)  fenofibrate tablet 54 mg (not administered)  glipiZIDE (GLUCOTROL XL) 24 hr tablet 10 mg (not administered)  metFORMIN (GLUCOPHAGE-XR) 24 hr tablet 750 mg (not administered)  simvastatin (ZOCOR) tablet 10 mg (not administered)  venlafaxine XR (EFFEXOR-XR) 24 hr capsule 150 mg (not administered)  PRISMS study - Placebo / alteplase 1 mg/mL infusion  (PI-Sethi) (67 mg Intravenous Given 06/22/14 1431)    Patient Vitals for the past 24 hrs:  BP Temp Temp src Pulse Resp SpO2 Weight  06/22/14 1445 116/96 mmHg - - 81 17 99 % -  06/22/14 1435 - 98.8 F (37.1 C) - - - - -  06/22/14 1433 144/65 mmHg 98.8 F (37.1 C) - 78 12 99 % -  06/22/14 1339 - - - - - - 163 lb 11.2 oz (74.254 kg)  06/22/14 1304 194/83 mmHg 98.7 F (37.1 C) Oral 80 18 98 % -     Case discussed with the hospitalist, they saw the patient, assume care and placed her into a trial.    Labs Review Labs Reviewed  COMPREHENSIVE METABOLIC PANEL - Abnormal; Notable for the following:    Glucose, Bld 262 (*)    ALT 47 (*)    Anion gap 17 (*)    All other components within normal limits  I-STAT CHEM 8, ED - Abnormal; Notable for the following:     Potassium 3.5 (*)    Glucose, Bld 262 (*)    All other components within normal limits  PROTIME-INR  APTT  CBC  DIFFERENTIAL  I-STAT TROPOININ, ED  CBG MONITORING, ED    Imaging Review Ct Head (brain) Wo Contrast  06/22/2014   CLINICAL DATA:  Left arm and leg numbness and tingling.  EXAM: CT HEAD WITHOUT CONTRAST  TECHNIQUE: Contiguous axial images were obtained from the base of the skull through the vertex without intravenous contrast.  COMPARISON:  None.  FINDINGS: The ventricles and sulci are normal. No intraparenchymal hemorrhage, mass effect nor midline shift. No acute large vascular territory infarcts.  No abnormal extra-axial fluid collections. Basal cisterns are patent. 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 3 mm density along the anterior fornices favor choroid plexus calcification, too superior in location for colloid cyst.  No skull fracture. The included ocular globes and orbital contents are non-suspicious. The mastoid aircells and included paranasal sinuses are well-aerated.  IMPRESSION: No acute intracranial process; if clinical suspicion persists for acute ischemia, MRI of the brain with diffusion-weighted sequences would be more sensitive.  Findings discussed with and reconfirmed by Barry Dienes on7/2/2015at1:25 PM.   Electronically Signed   By: Elon Alas   On: 06/22/2014 13:26   Dg Chest Port 1 View  06/22/2014   CLINICAL DATA:  Left-sided weakness today  EXAM: PORTABLE CHEST - 1 VIEW  COMPARISON:  10/29/2009  FINDINGS: The heart size and mediastinal contours are within normal limits. Both lungs are clear. The visualized skeletal structures are unremarkable.  IMPRESSION: No active disease.   Electronically Signed   By: Kathreen Devoid   On: 06/22/2014 14:46     EKG Interpretation   Date/Time:  Thursday June 22 2014 13:34:43 EDT Ventricular Rate:  85 PR Interval:  105 QRS Duration: 113 QT Interval:  342 QTC Calculation: 407 R Axis:   71 Text Interpretation:   Sinus rhythm Short PR interval Borderline  intraventricular conduction delay Borderline repolarization abnormality  Since last tracing repolarization abnormality is new. Confirmed by Eulis Foster   MD, Vira Agar [phone]) on 06/22/2014 1:47:48 PM      MDM   Final diagnoses:  Stroke  Occlusion and stenosis of carotid artery without mention of cerebral infarction   Evaluation, consistent with stroke, with some improvement of symptoms.  Nursing Notes Reviewed/ Care Coordinated, and agree without changes. Applicable Imaging Reviewed.  Interpretation of Laboratory Data incorporated into ED treatment  Plan: Admit    Richarda Blade, MD 06/22/14 1500

## 2014-06-22 NOTE — ED Notes (Addendum)
Pt states she awoke this morning and felt fine. States while working on the computer at 1145 she started to note left arm weakness and "I felt dizzy." Pt states she stood and noted that she was leaning to the left and then "I just fell".  Pt states she then had slurred speech that lasted appx 5 minutes. Pt has clear speech. No facial droop. Grips equal, no drift noted. Pt states "my left arm feels heavy and it feels numb."

## 2014-06-22 NOTE — Consult Note (Deleted)
Referring Physician: ED    Chief Complaint: Code stroke  HPI:                                                                                                                                         Andrea [name] is an 48 y.o. female who was at home and feeling normal while on her computer. At 1145 she noted she felt dizzy and then her left arm was clumsy. She tried to get up and then slumped to the left and was weak on her left leg.  She sat there for a moment and her symptoms seemed to clear but she still noted decreased sensation on her left arm and leg. On arrival to CT she remained to have decreased sensation in left arm and leg.  Currently she states her symptoms are improving.   Of note: As of  April 2015, She has had right and left CEA with post op occlusion right common carotid and ICA She also has 60-79% left carotid stenosis. Followed by Dr. Scot Dock with follow up appt in October.    Date last known well: Date: 06/22/2014 Time last known well: Time: 11:45 tPA Given: No: minimal symptoms and symptoms improving NIHSS 2  Past Medical History  Diagnosis Date  . Hypertension   . Anxiety     on Alprazolam  . Depression     on Effexor  . Diabetes mellitus without complication     2993  . Heart murmur     was seen by cardiologist in [facility] 2013  . Allergy   . Atrial fibrillation   . Peripheral vascular disease     Past Surgical History  Procedure Laterality Date  . Cholecystectomy    . Cesarean section    . Tubal ligation    . Femoral bypass      x 2 on right side due to re-stenosis  . Left/right carotid artery      2003 and 2009 for right, right side is 100% occluded per patient; left side 2009  . 2 stents    . Carotid endarterectomy      Family History  Problem Relation Age of Onset  . Diabetes Mother   . Heart disease Mother   . Hyperlipidemia Mother   . Hypertension Mother   . Heart disease Father   . Hyperlipidemia Father   . Hypertension Father    . Diabetes Maternal Grandmother   . Heart disease Maternal Grandmother   . Hyperlipidemia Maternal Grandmother   . Cancer Paternal Grandmother   . Cancer Sister   . Varicose Veins Sister   . Heart attack Sister    Social History:  reports that she quit smoking about 3 years ago. She does not have any smokeless tobacco history on file. She reports that she drinks alcohol. She reports that she does not use illicit drugs.  Allergies:  Allergies  Allergen Reactions  . Other Hives    Contrast dye     Medications:                                                                                                                           No current facility-administered medications for this encounter.   Current Outpatient Prescriptions  Medication Sig Dispense Refill  . ALPRAZolam (XANAX) 0.5 MG tablet Take 1 tablet (0.5 mg total) by mouth 3 (three) times daily as needed for anxiety. Can only refill after every 28 days  90 tablet  3  . amLODipine (NORVASC) 10 MG tablet Take 1 tablet (10 mg total) by mouth daily.  90 tablet  3  . aspirin 325 MG tablet Take 325 mg by mouth daily.      . clopidogrel (PLAVIX) 75 MG tablet Take 1 tablet (75 mg total) by mouth daily with breakfast.  90 tablet  3  . fenofibrate 54 MG tablet Take 1 tablet (54 mg total) by mouth daily.  90 tablet  1  . glipiZIDE (GLUCOTROL XL) 10 MG 24 hr tablet Take 1 tablet (10 mg total) by mouth daily with breakfast.  90 tablet  3  . metFORMIN (GLUCOPHAGE XR) 750 MG 24 hr tablet Take 1 tablet (750 mg total) by mouth 2 (two) times daily with a meal. New dose frequency  180 tablet  1  . simvastatin (ZOCOR) 10 MG tablet Take 1 tablet (10 mg total) by mouth at bedtime.  90 tablet  3  . venlafaxine XR (EFFEXOR-XR) 150 MG 24 hr capsule Take 1 capsule (150 mg total) by mouth daily with breakfast.  90 capsule  3     ROS:                                                                                                                                        History obtained from the patient  General ROS: negative for - chills, fatigue, fever, night sweats, weight gain or weight loss Psychological ROS: negative for - behavioral disorder, hallucinations, memory difficulties, mood swings or suicidal ideation Ophthalmic ROS: negative for - blurry vision, double vision, eye pain or loss of vision ENT ROS: negative for - epistaxis, nasal discharge, oral lesions, sore throat, tinnitus or vertigo Allergy and Immunology ROS: negative for - hives or itchy/watery eyes Hematological and Lymphatic ROS: negative for -  bleeding problems, bruising or swollen lymph nodes Endocrine ROS: negative for - galactorrhea, hair pattern changes, polydipsia/polyuria or temperature intolerance Respiratory ROS: negative for - cough, hemoptysis, shortness of breath or wheezing Cardiovascular ROS: negative for - chest pain, dyspnea on exertion, edema or irregular heartbeat Gastrointestinal ROS: negative for - abdominal pain, diarrhea, hematemesis, nausea/vomiting or stool incontinence Genito-Urinary ROS: negative for - dysuria, hematuria, incontinence or urinary frequency/urgency Musculoskeletal ROS: negative for - joint swelling or muscular weakness Neurological ROS: as noted in HPI Dermatological ROS: negative for rash and skin lesion changes  Neurologic Examination:                                                                                                      Blood pressure 194/83, pulse 80, temperature 98.7 F (37.1 C), temperature source Oral, resp. rate 18, SpO2 98.00%.   Mental Status: Alert, oriented, thought content appropriate.  Speech fluent without evidence of aphasia.  Able to follow 3 step commands without difficulty. Cranial Nerves: II: Discs flat bilaterally; Visual fields grossly normal, pupils equal, round, reactive to light and accommodation III,IV, VI: ptosis not present, extra-ocular motions intact bilaterally V,VII: smile  symmetric, facial light touch sensation normal bilaterally VIII: hearing normal bilaterally IX,X: gag reflex present XI: bilateral shoulder shrug XII: midline tongue extension without atrophy or fasciculations  Motor: Right : Upper extremity   5/5    Left:     Upper extremity   5/5  Lower extremity   5/5     Lower extremity   5/5 Tone and bulk:normal tone throughout; no atrophy noted Sensory: Pinprick and light touch slightly decreased in the left arm and leg Deep Tendon Reflexes:  Right: Upper Extremity   Left: Upper extremity   biceps (C-5 to C-6) 2/4   biceps (C-5 to C-6) 2/4 tricep (C7) 2/4    triceps (C7) 2/4 Brachioradialis (C6) 2/4  Brachioradialis (C6) 2/4  Lower Extremity Lower Extremity  quadriceps (L-2 to L-4) 2/4   quadriceps (L-2 to L-4) 2/4 Achilles (S1) 2/4   Achilles (S1) 2/4  Plantars: Right: downgoing   Left: downgoing Cerebellar: normal finger-to-nose,  normal heel-to-shin test Gait: Not tested.  CV: pulses palpable throughout    Lab Results: Basic Metabolic Panel: No results found for this basename: NA, K, CL, CO2, GLUCOSE, BUN, CREATININE, CALCIUM, MG, PHOS,  in the last 168 hours  Liver Function Tests: No results found for this basename: AST, ALT, ALKPHOS, BILITOT, PROT, ALBUMIN,  in the last 168 hours No results found for this basename: LIPASE, AMYLASE,  in the last 168 hours No results found for this basename: AMMONIA,  in the last 168 hours  CBC: No results found for this basename: WBC, NEUTROABS, HGB, HCT, MCV, PLT,  in the last 168 hours  Cardiac Enzymes: No results found for this basename: CKTOTAL, CKMB, CKMBINDEX, TROPONINI,  in the last 168 hours  Lipid Panel: No results found for this basename: CHOL, TRIG, HDL, CHOLHDL, VLDL, LDLCALC,  in the last 168 hours  CBG: No results found for this basename: GLUCAP,  in the last  168 hours  Microbiology: No results found for this or any previous visit.  Coagulation Studies: No results found  for this basename: LABPROT, INR,  in the last 72 hours  Imaging: No results found.   Assessment and plan discussed with with attending physician and they are in agreement.    Etta Quill PA-C [facility] [phone]  06/22/2014, 1:25 PM   Assessment: 48 y.o. female with history of right common carotid and ICA occlusion along with left ICA stenosis >80 followed by Dr. Scot Dock.  Presenting to ED with left arm and leg decreased sensation. Patient was not a tPA candidate due to minimal and improving symptoms. Given risk factors likely right CVA.   Stroke Risk Factors - atrial fibrillation, diabetes mellitus and hypertension  1. HgbA1c, fasting lipid panel 2. MRI, MRA  of the brain without contrast 3. PT consult, OT consult, Speech consult 4. Echocardiogram 5. Prophylactic therapy-Antiplatelet med: Aspirin - dose 325 mg daily and Antiplatelet med: Plavix - dose 75 mg daily 6. Risk factor modification 7. Telemetry monitoring 8. Frequent neuro checks

## 2014-06-22 NOTE — ED Notes (Signed)
Family and MD at the bedside. Stroke Team at the Bedside.

## 2014-06-22 NOTE — Progress Notes (Signed)
*  PRELIMINARY RESULTS* Echocardiogram 2D Echocardiogram has been performed.  Leavy Cella 06/22/2014, 4:02 PM

## 2014-06-22 NOTE — H&P (Signed)
Stroke H&P  Chief Complaint: Code stroke  HPI:                                                                                                                                         Andrea [name] is an 48 y.o. female who was at home and feeling normal while on her computer. At 1145 she noted she felt dizzy and then her left arm was clumsy. She tried to get up and then slumped to the left and was weak on her left leg.  She sat there for a moment and her symptoms seemed to clear but she still noted decreased sensation on her left arm and leg. On arrival to CT she remained to have decreased sensation in left arm and leg.  Currently she states her symptoms are improving.   Of note: As of  April 2015, She has had right and left CEA with post op occlusion right common carotid and ICA She also has 60-79% left carotid stenosis. Followed by Dr. Scot Dock with follow up appt in October.    Date last known well: Date: 06/22/2014 Time last known well: Time: 11:45 tPA Given: No: minimal symptoms and symptoms improving NIHSS 2  Past Medical History  Diagnosis Date  . Hypertension   . Anxiety     on Alprazolam  . Depression     on Effexor  . Diabetes mellitus without complication     3329  . Heart murmur     was seen by cardiologist in [facility] 2013  . Allergy   . Atrial fibrillation   . Peripheral vascular disease     Past Surgical History  Procedure Laterality Date  . Cholecystectomy    . Cesarean section    . Tubal ligation    . Femoral bypass      x 2 on right side due to re-stenosis  . Left/right carotid artery      2003 and 2009 for right, right side is 100% occluded per patient; left side 2009  . 2 stents    . Carotid endarterectomy      Family History  Problem Relation Age of Onset  . Diabetes Mother   . Heart disease Mother   . Hyperlipidemia Mother   . Hypertension Mother   . Heart disease Father   . Hyperlipidemia Father   . Hypertension Father   .  Diabetes Maternal Grandmother   . Heart disease Maternal Grandmother   . Hyperlipidemia Maternal Grandmother   . Cancer Paternal Grandmother   . Cancer Sister   . Varicose Veins Sister   . Heart attack Sister    Social History:  reports that she quit smoking about 3 years ago. She does not have any smokeless tobacco history on file. She reports that she drinks alcohol. She reports that she does not use illicit drugs.  Allergies:  Allergies  Allergen Reactions  .  Other Hives    Contrast dye     Medications:                                                                                                                           No current facility-administered medications for this encounter.   Current Outpatient Prescriptions  Medication Sig Dispense Refill  . ALPRAZolam (XANAX) 0.5 MG tablet Take 1 tablet (0.5 mg total) by mouth 3 (three) times daily as needed for anxiety. Can only refill after every 28 days  90 tablet  3  . amLODipine (NORVASC) 10 MG tablet Take 1 tablet (10 mg total) by mouth daily.  90 tablet  3  . aspirin 325 MG tablet Take 325 mg by mouth daily.      . clopidogrel (PLAVIX) 75 MG tablet Take 1 tablet (75 mg total) by mouth daily with breakfast.  90 tablet  3  . fenofibrate 54 MG tablet Take 1 tablet (54 mg total) by mouth daily.  90 tablet  1  . glipiZIDE (GLUCOTROL XL) 10 MG 24 hr tablet Take 1 tablet (10 mg total) by mouth daily with breakfast.  90 tablet  3  . metFORMIN (GLUCOPHAGE XR) 750 MG 24 hr tablet Take 1 tablet (750 mg total) by mouth 2 (two) times daily with a meal. New dose frequency  180 tablet  1  . simvastatin (ZOCOR) 10 MG tablet Take 1 tablet (10 mg total) by mouth at bedtime.  90 tablet  3  . venlafaxine XR (EFFEXOR-XR) 150 MG 24 hr capsule Take 1 capsule (150 mg total) by mouth daily with breakfast.  90 capsule  3     ROS:                                                                                                                                        History obtained from the patient  General ROS: negative for - chills, fatigue, fever, night sweats, weight gain or weight loss Psychological ROS: negative for - behavioral disorder, hallucinations, memory difficulties, mood swings or suicidal ideation Ophthalmic ROS: negative for - blurry vision, double vision, eye pain or loss of vision ENT ROS: negative for - epistaxis, nasal discharge, oral lesions, sore throat, tinnitus or vertigo Allergy and Immunology ROS: negative for - hives or itchy/watery eyes Hematological and Lymphatic ROS: negative for - bleeding problems, bruising  or swollen lymph nodes Endocrine ROS: negative for - galactorrhea, hair pattern changes, polydipsia/polyuria or temperature intolerance Respiratory ROS: negative for - cough, hemoptysis, shortness of breath or wheezing Cardiovascular ROS: negative for - chest pain, dyspnea on exertion, edema or irregular heartbeat Gastrointestinal ROS: negative for - abdominal pain, diarrhea, hematemesis, nausea/vomiting or stool incontinence Genito-Urinary ROS: negative for - dysuria, hematuria, incontinence or urinary frequency/urgency Musculoskeletal ROS: negative for - joint swelling or muscular weakness Neurological ROS: as noted in HPI Dermatological ROS: negative for rash and skin lesion changes  General Exam: CV: S1, S2 audible Lungs CTAB ABD: Soft NT/ND Skin W/D/I  Neurologic Examination:                                                                                                      Blood pressure 194/83, pulse 80, temperature 98.7 F (37.1 C), temperature source Oral, resp. rate 18, SpO2 98.00%.   Mental Status: Alert, oriented, thought content appropriate.  Speech fluent without evidence of aphasia.  Able to follow 3 step commands without difficulty. Cranial Nerves: II: Discs flat bilaterally; Visual fields grossly normal, pupils equal, round, reactive to light and accommodation III,IV, VI: ptosis  not present, extra-ocular motions intact bilaterally V,VII: smile symmetric, facial light touch sensation normal bilaterally VIII: hearing normal bilaterally IX,X: gag reflex present XI: bilateral shoulder shrug XII: midline tongue extension without atrophy or fasciculations  Motor: Right : Upper extremity   5/5    Left:     Upper extremity   5/5  Lower extremity   5/5     Lower extremity   5/5 Tone and bulk:normal tone throughout; no atrophy noted Sensory: Pinprick and light touch slightly decreased in the left arm and leg Deep Tendon Reflexes:  Right: Upper Extremity   Left: Upper extremity   biceps (C-5 to C-6) 2/4   biceps (C-5 to C-6) 2/4 tricep (C7) 2/4    triceps (C7) 2/4 Brachioradialis (C6) 2/4  Brachioradialis (C6) 2/4  Lower Extremity Lower Extremity  quadriceps (L-2 to L-4) 2/4   quadriceps (L-2 to L-4) 2/4 Achilles (S1) 2/4   Achilles (S1) 2/4  Plantars: Right: downgoing   Left: downgoing Cerebellar: normal finger-to-nose,  normal heel-to-shin test Gait: Not tested.  CV: pulses palpable throughout    Laboratory Studies:  Basic Metabolic Panel:  Recent Labs Lab 06/22/14 1325 06/22/14 1335  NA 139 139  K 3.7 3.5*  CL 98 103  CO2 24  --   GLUCOSE 262* 262*  BUN 11 9  CREATININE 0.52 0.60  CALCIUM 10.2  --     Liver Function Tests:  Recent Labs Lab 06/22/14 1325  AST 28  ALT 47*  ALKPHOS 93  BILITOT 0.3  PROT 7.5  ALBUMIN 4.2   No results found for this basename: LIPASE, AMYLASE,  in the last 168 hours No results found for this basename: AMMONIA,  in the last 168 hours  CBC:  Recent Labs Lab 06/22/14 1325 06/22/14 1335  WBC 7.2  --   NEUTROABS 3.7  --   HGB 13.7 15.0  HCT 40.3  44.0  MCV 87.2  --   PLT 290  --     Cardiac Enzymes: No results found for this basename: CKTOTAL, CKMB, CKMBINDEX, TROPONINI,  in the last 168 hours  BNP: No components found with this basename: POCBNP,   CBG: No results found for this basename:  GLUCAP,  in the last 168 hours  Microbiology: No results found for this or any previous visit.  Coagulation Studies:  Recent Labs  06/22/14 1325  LABPROT 12.9  INR 0.97    Urinalysis: No results found for this basename: COLORURINE, APPERANCEUR, LABSPEC, PHURINE, GLUCOSEU, HGBUR, BILIRUBINUR, KETONESUR, PROTEINUR, UROBILINOGEN, NITRITE, LEUKOCYTESUR,  in the last 168 hours  Lipid Panel:    Component Value Date/Time   CHOL 155 06/09/2014 1128   TRIG 136 06/09/2014 1128   HDL 41 06/09/2014 1128   CHOLHDL 3.8 06/09/2014 1128   VLDL 27 06/09/2014 1128   LDLCALC 87 06/09/2014 1128    HgbA1C:  Lab Results  Component Value Date   HGBA1C 7.3 06/09/2014    Urine Drug Screen:   No results found for this basename: labopia, cocainscrnur, labbenz, amphetmu, thcu, labbarb    Alcohol Level: No results found for this basename: ETH,  in the last 168 hours  Imaging: Ct Head (brain) Wo Contrast  06/22/2014   CLINICAL DATA:  Left arm and leg numbness and tingling.  EXAM: CT HEAD WITHOUT CONTRAST  TECHNIQUE: Contiguous axial images were obtained from the base of the skull through the vertex without intravenous contrast.  COMPARISON:  None.  FINDINGS: The ventricles and sulci are normal. No intraparenchymal hemorrhage, mass effect nor midline shift. No acute large vascular territory infarcts.  No abnormal extra-axial fluid collections. Basal cisterns are patent. 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 3 mm density along the anterior fornices favor choroid plexus calcification, too superior in location for colloid cyst.  No skull fracture. The included ocular globes and orbital contents are non-suspicious. The mastoid aircells and included paranasal sinuses are well-aerated.  IMPRESSION: No acute intracranial process; if clinical suspicion persists for acute ischemia, MRI of the brain with diffusion-weighted sequences would be more sensitive.  Findings discussed with and reconfirmed by Barry Dienes  on7/2/2015at1:25 PM.   Electronically Signed   By: Elon Alas   On: 06/22/2014 13:26    Etta Quill PA-C [facility] [phone]  06/22/2014, 1:25 PM  Patient seen and examined.  Clinical course and management discussed.  Necessary edits performed.  I agree with the above.  Assessment and plan of care developed and discussed below.      Assessment: 48 y.o. female with history of right common carotid and ICA occlusion along with left ICA stenosis >80 followed by Dr. Scot Dock who   presents to ED with left arm and leg decreased sensation. Patient was not a tPA candidate due to minimal and improving symptoms. Has multiple vascular risk factors.  Discussion had with patient concerning PRISMS trial which she agreed to.  Patient enrolled.  Drug vs placebo administered.    Stroke Risk Factors - carotid stenosis, atrial fibrillation, diabetes mellitus and hypertension   Recommendations: 1. HgbA1c, fasting lipid panel 2. MRI, MRA  of the brain without contrast 3. PT consult, OT consult, Speech consult 4. Echocardiogram 5. Prophylactic therapy-Patient to receive medications per research protocol 6. Risk factor modification 7. Telemetry monitoring 8. Frequent neuro checks 9. Admission to 55M 10. Repeat head CT in 24 hours   Alexis Goodell, MD [facility] [phone]  06/22/2014  2:51 PM

## 2014-06-22 NOTE — ED Notes (Signed)
Echocardiogram TEch at the bedside.

## 2014-06-23 ENCOUNTER — Inpatient Hospital Stay (HOSPITAL_COMMUNITY): Payer: Medicare FFS

## 2014-06-23 DIAGNOSIS — I739 Peripheral vascular disease, unspecified: Secondary | ICD-10-CM

## 2014-06-23 LAB — LIPID PANEL
CHOL/HDL RATIO: 5.3 ratio
Cholesterol: 181 mg/dL (ref 0–200)
HDL: 34 mg/dL — ABNORMAL LOW (ref >39)
LDL Cholesterol: 100 mg/dL — ABNORMAL HIGH (ref 0–99)
Triglycerides: 235 mg/dL — ABNORMAL HIGH (ref <150)
VLDL: 47 mg/dL — AB (ref 0–40)

## 2014-06-23 LAB — GLUCOSE, CAPILLARY
GLUCOSE-CAPILLARY: 169 mg/dL — AB (ref 70–99)
GLUCOSE-CAPILLARY: 176 mg/dL — AB (ref 70–99)
GLUCOSE-CAPILLARY: 120 mg/dL — AB (ref 70–99)
Glucose-Capillary: 157 mg/dL — ABNORMAL HIGH (ref 70–99)

## 2014-06-23 LAB — HEMOGLOBIN A1C
Hgb A1c MFr Bld: 7.5 % — ABNORMAL HIGH (ref <5.7)
Mean Plasma Glucose: 169 mg/dL — ABNORMAL HIGH (ref <117)

## 2014-06-23 MED ORDER — DOCUSATE SODIUM 100 MG PO CAPS
100.0000 mg | ORAL_CAPSULE | Freq: Two times a day (BID) | ORAL | Status: DC
  Administered 2014-06-23 – 2014-06-24 (×2): 100 mg via ORAL
  Filled 2014-06-23 (×2): qty 1

## 2014-06-23 MED ORDER — CLOPIDOGREL BISULFATE 75 MG PO TABS
75.0000 mg | ORAL_TABLET | Freq: Every day | ORAL | Status: DC
  Administered 2014-06-23 – 2014-06-24 (×2): 75 mg via ORAL
  Filled 2014-06-23 (×2): qty 1

## 2014-06-23 MED ORDER — ASPIRIN 325 MG PO TABS
325.0000 mg | ORAL_TABLET | Freq: Every day | ORAL | Status: DC
  Administered 2014-06-23 – 2014-06-24 (×2): 325 mg via ORAL
  Filled 2014-06-23 (×2): qty 1

## 2014-06-23 MED ORDER — LABETALOL HCL 5 MG/ML IV SOLN: 10.0000 mg | INTRAVENOUS | Status: DC | PRN

## 2014-06-23 MED ORDER — PANTOPRAZOLE SODIUM 40 MG PO TBEC
40.0000 mg | DELAYED_RELEASE_TABLET | Freq: Every day | ORAL | Status: DC
  Administered 2014-06-23 – 2014-06-24 (×2): 40 mg via ORAL
  Filled 2014-06-23 (×2): qty 1

## 2014-06-23 NOTE — Progress Notes (Signed)
Occupational Therapy Evaluation Patient Details Name: Andrea [name] MRN: [id number] DOB: May 18, 1966 Today's Date: 06/23/2014    History of Present Illness 48 y.o. female who was at home and feeling normal while on her computer. At 1145 on 06/22/14 she noted she felt dizzy and then her left arm was clumsy. She tried to get up and then slumped to the left and was weak on her left leg. She sat there for a moment and her symptoms seemed to clear but she still noted decreased sensation on her left arm and leg.CT -. MRI pending.    Clinical Impression   PTA, pt independent with all ADL and mobility. No deficits noted. Educated pt/family on warning signs/symptoms of CVA. Pt safe to D/C home from OT standpoint when medically stable. No equipment needs.    Follow Up Recommendations  No OT follow up;Supervision - Intermittent    Equipment Recommendations  None recommended by OT    Recommendations for Other Services       Precautions / Restrictions Precautions Precautions: None      Mobility Bed Mobility Overal bed mobility: Modified Independent                Transfers Overall transfer level: Modified independent                    Balance Overall balance assessment: No apparent balance deficits (not formally assessed)                                          ADL Overall ADL's : Modified independent                                             Vision                     Perception Perception Comments: appears [facility]   Praxis Praxis Praxis tested?: Within functional limits    Pertinent Vitals/Pain VSS. No c/o pain     Hand Dominance Right   Extremity/Trunk Assessment Upper Extremity Assessment Upper Extremity Assessment: Overall WFL for tasks assessed   Lower Extremity Assessment Lower Extremity Assessment: Overall WFL for tasks assessed   Cervical / Trunk Assessment Cervical / Trunk Assessment: Normal    Communication Communication Communication: No difficulties   Cognition Arousal/Alertness: Awake/alert Behavior During Therapy: WFL for tasks assessed/performed Overall Cognitive Status: Within Functional Limits for tasks assessed                     General Comments       Exercises       Shoulder Instructions      Home Living Family/patient expects to be discharged to:: Private residence Living Arrangements: Spouse/significant other;Children Available Help at Discharge: Family;Available 24 hours/day Type of Home: House Home Access: Stairs to enter [employer] of Steps: 2 Entrance Stairs-Rails: None Home Layout: One level     Bathroom Shower/Tub: Tub/shower unit Shower/tub characteristics: Architectural technologist: Standard Bathroom Accessibility: Yes How Accessible: Accessible via walker Home Equipment: None          Prior Functioning/Environment Level of Independence: Independent        Comments: on disability    OT Diagnosis:     OT Problem List:  OT Treatment/Interventions:      OT Goals(Current goals can be found in the care plan section) Acute Rehab OT Goals Patient Stated Goal: go home  OT Frequency:     Barriers to D/C:            Co-evaluation              End of Session Equipment Utilized During Treatment: Gait belt Nurse Communication: Mobility status  Activity Tolerance: Patient tolerated treatment well Patient left: in chair;with call bell/phone within reach;with family/visitor present   Time: 3762-8315 OT Time Calculation (min): 12 min Charges:  OT General Charges $OT Visit: 1 Procedure OT Evaluation $Initial OT Evaluation Tier I: 1 Procedure OT Treatments $Therapeutic Activity: 8-22 mins G-Codes:    Paizlie Klaus,HILLARY 07-11-14, 12:52 PM   [facility], OTR/L  [phone] July 11, 2014

## 2014-06-23 NOTE — Progress Notes (Signed)
Pt and pt's family updated that she's received transfer orders to [facility] - no questions or concerns at this time.  Report given to [facility] RN - pt, meds, chart and belongings transferred via wheelchair - no c/o pain or discomfort - no acute issues upon transfer.

## 2014-06-23 NOTE — Progress Notes (Signed)
UR completed.  Susana Gripp, RN BSN M[facility] CCM Trauma/Neuro ICU Case Manager [phone]  

## 2014-06-23 NOTE — Evaluation (Signed)
Physical Therapy Evaluation Patient Details Name: Andrea [name] MRN: [id number] DOB: Dec 12, 1966 Today's Date: 06/23/2014   History of Present Illness  48 y.o. female who was at home and feeling normal while on her computer. At 1145 on 06/22/14 she noted she felt dizzy and then her left arm was clumsy. She tried to get up and then slumped to the left and was weak on her left leg. She sat there for a moment and her symptoms seemed to clear but she still noted decreased sensation on her left arm and leg.CT -. MRI pending.   Clinical Impression  Patient presents close to functional baseline and without need for skilled PT at this time.  No follow up recommendations at this time.  Did review education for stroke risk factors and warning signs.      Follow Up Recommendations No PT follow up    Equipment Recommendations  None recommended by PT    Recommendations for Other Services       Precautions / Restrictions Precautions Precautions: None      Mobility  Bed Mobility Overal bed mobility: Modified Independent                Transfers Overall transfer level: Modified independent Equipment used: None                Ambulation/Gait Ambulation/Gait assistance: Independent Ambulation Distance (Feet): 320 Feet Assistive device: None Gait Pattern/deviations: Step-through pattern;WFL(Within Functional Limits)   Gait velocity interpretation: at or above normal speed for age/gender General Gait Details: min c/o bilateral hip trouble with walking too fast  Stairs Stairs: Yes Stairs assistance: Supervision Stair Management: One rail Right;Alternating pattern Number of Stairs: 2 General stair comments: limited # due to IV/monitors  Wheelchair Mobility    Modified Rankin (Stroke Patients Only) Modified Rankin (Stroke Patients Only) Pre-Morbid Rankin Score: No symptoms Modified Rankin: Slight disability     Balance Overall balance assessment: No apparent balance  deficits (not formally assessed)                               Standardized Balance Assessment Standardized Balance Assessment : Dynamic Gait Index   Dynamic Gait Index Level Surface: Normal Change in Gait Speed: Normal Gait with Horizontal Head Turns: Normal Gait with Vertical Head Turns: Normal Gait and Pivot Turn: Normal Step Over Obstacle: Normal Step Around Obstacles: Normal Steps: Mild Impairment Total Score: 23       Pertinent Vitals/Pain No current comlaints    Home Living Family/patient expects to be discharged to:: Private residence Living Arrangements: Spouse/significant other;Children Available Help at Discharge: Family;Available 24 hours/day Type of Home: House Home Access: Stairs to enter Entrance Stairs-Rails: None Entrance Stairs-Number of Steps: 2 Home Layout: One level Home Equipment: None      Prior Function Level of Independence: Independent         Comments: on disability     Hand Dominance   Dominant Hand: Right    Extremity/Trunk Assessment   Upper Extremity Assessment: Overall WFL for tasks assessed           Lower Extremity Assessment: Overall WFL for tasks assessed      Cervical / Trunk Assessment: Normal  Communication   Communication: No difficulties  Cognition Arousal/Alertness: Awake/alert Behavior During Therapy: WFL for tasks assessed/performed Overall Cognitive Status: Within Functional Limits for tasks assessed  General Comments General comments (skin integrity, edema, etc.): Educated pt/family on warning signs/symptomsof CVA    Exercises Other Exercises Other Exercises: reviewed stroke warning signs and risk factor modification wtih patient      Assessment/Plan    PT Assessment Patent does not need any further PT services  PT Diagnosis     PT Problem List    PT Treatment Interventions     PT Goals (Current goals can be found in the Care Plan section) Acute  Rehab PT Goals Patient Stated Goal: go home PT Goal Formulation: No goals set, d/c therapy    Frequency     Barriers to discharge        Co-evaluation               End of Session Equipment Utilized During Treatment: Gait belt Activity Tolerance: Patient tolerated treatment well Patient left: in chair      Functional Assessment Tool Used: Clinical Judgement Functional Limitation: Self care Self Care Current Status (H7342): At least 1 percent but less than 20 percent impaired, limited or restricted Self Care Goal Status [phone]): 0 percent impaired, limited or restricted Self Care Discharge Status [phone]): 0 percent impaired, limited or restricted    Time: 2035-5974 PT Time Calculation (min): 17 min   Charges:   PT Evaluation $Initial PT Evaluation Tier I: 1 Procedure PT Treatments $Self Care/Home Management: 8-22   PT G Codes:   Functional Assessment Tool Used: Clinical Judgement Functional Limitation: Self care    [facility] 06/23/2014, 2:07 PM Magda Kiel, [place] 06/23/2014

## 2014-06-23 NOTE — Progress Notes (Addendum)
Stroke Team Progress Note  HISTORY Andrea [name] is an 48 y.o. female who was at home and feeling normal while on her computer. At 1145 on 06/22/14 she noted she felt dizzy and then her left arm was clumsy. She tried to get up and then slumped to the left and was weak on her left leg. She sat there for a moment and her symptoms seemed to clear but she still noted decreased sensation on her left arm and leg. On arrival to CT she remained to have decreased sensation in left arm and leg. Currently she states her symptoms are improving.  Of note: As of April 2015, She has had right and left CEA with post op occlusion right common carotid and ICA She also has 60-79% left carotid stenosis. Followed by Dr. Scot Dock with follow up appt in October.  Date last known well: Date: 06/22/2014  Time last known well: Time: 11:45  tPA Given: No: minimal symptoms and symptoms improving  NIHSS 2  Patient was not administered TPA secondary to resolving and mild symptoms but wasn't in the PRISMS trial of IV TPA versus placebo for patients with mild and improving stroke symptoms. She was admitted to the neuro ICU  for further evaluation and treatment.  SUBJECTIVE Her husband and son are at the bedside.  Overall she feels her condition is improving. Her clumsiness and weakness have improved and mild heaviness persists. She wants to get out of bed today. She noted improvement in her symptoms after she got the IV infusion of the study medication yesterday.  OBJECTIVE Most recent Vital Signs: Filed Vitals:   06/23/14 0800 06/23/14 0900 06/23/14 1000 06/23/14 1100  BP: 133/93 163/66 136/58 118/45  Pulse: 63 77 68 64  Temp:      TempSrc:      Resp: 8 16 16 17   Height:      Weight:      SpO2: 93% 97% 95% 100%   CBG (last 3)   Recent Labs  06/22/14 1727 06/22/14 2104 06/23/14 0749  GLUCAP 169* 230* 169*    IV Fluid Intake:   . sodium chloride 75 mL/hr (06/23/14 0329)    MEDICATIONS  . amLODipine  10 mg  Oral Daily  . fenofibrate  54 mg Oral Daily  . glipiZIDE  10 mg Oral Q breakfast  . metFORMIN  750 mg Oral BID WC  . pantoprazole  40 mg Oral Daily  . simvastatin  10 mg Oral QHS  . venlafaxine XR  150 mg Oral Q breakfast   PRN:  acetaminophen, acetaminophen, ALPRAZolam, labetalol, senna-docusate  Diet:  Cardiac  Activity:  Bedrest DVT Prophylaxis:  None as enrolled in the present study of IV TPA versus placebo  CLINICALLY SIGNIFICANT STUDIES Basic Metabolic Panel:  Recent Labs Lab 06/22/14 1325 06/22/14 1335  NA 139 139  K 3.7 3.5*  CL 98 103  CO2 24  --   GLUCOSE 262* 262*  BUN 11 9  CREATININE 0.52 0.60  CALCIUM 10.2  --    Liver Function Tests:  Recent Labs Lab 06/22/14 1325  AST 28  ALT 47*  ALKPHOS 93  BILITOT 0.3  PROT 7.5  ALBUMIN 4.2   CBC:  Recent Labs Lab 06/22/14 1325 06/22/14 1335  WBC 7.2  --   NEUTROABS 3.7  --   HGB 13.7 15.0  HCT 40.3 44.0  MCV 87.2  --   PLT 290  --    Coagulation:  Recent Labs Lab 06/22/14 1325  LABPROT 12.9  INR 0.97   Cardiac Enzymes: No results found for this basename: CKTOTAL, CKMB, CKMBINDEX, TROPONINI,  in the last 168 hours Urinalysis: No results found for this basename: COLORURINE, APPERANCEUR, LABSPEC, PHURINE, GLUCOSEU, HGBUR, BILIRUBINUR, KETONESUR, PROTEINUR, UROBILINOGEN, NITRITE, LEUKOCYTESUR,  in the last 168 hours Lipid Panel    Component Value Date/Time   CHOL 181 06/23/2014 0301   TRIG 235* 06/23/2014 0301   HDL 34* 06/23/2014 0301   CHOLHDL 5.3 06/23/2014 0301   VLDL 47* 06/23/2014 0301   LDLCALC 100* 06/23/2014 0301   HgbA1C  Lab Results  Component Value Date   HGBA1C 7.3 06/09/2014    Urine Drug Screen:   No results found for this basename: labopia, cocainscrnur, labbenz, amphetmu, thcu, labbarb    Alcohol Level: No results found for this basename: ETH,  in the last 168 hours  Ct Head (brain) Wo Contrast  06/22/2014   CLINICAL DATA:  Left arm and leg numbness and tingling.  EXAM: CT HEAD  WITHOUT CONTRAST  TECHNIQUE: Contiguous axial images were obtained from the base of the skull through the vertex without intravenous contrast.  COMPARISON:  None.  FINDINGS: The ventricles and sulci are normal. No intraparenchymal hemorrhage, mass effect nor midline shift. No acute large vascular territory infarcts.  No abnormal extra-axial fluid collections. Basal cisterns are patent. 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 3 mm density along the anterior fornices favor choroid plexus calcification, too superior in location for colloid cyst.  No skull fracture. The included ocular globes and orbital contents are non-suspicious. The mastoid aircells and included paranasal sinuses are well-aerated.  IMPRESSION: No acute intracranial process; if clinical suspicion persists for acute ischemia, MRI of the brain with diffusion-weighted sequences would be more sensitive.  Findings discussed with and reconfirmed by Barry Dienes on7/2/2015at1:25 PM.   Electronically Signed   By: Elon Alas   On: 06/22/2014 13:26   Dg Chest Port 1 View  06/22/2014   CLINICAL DATA:  Left-sided weakness today  EXAM: PORTABLE CHEST - 1 VIEW  COMPARISON:  10/29/2009  FINDINGS: The heart size and mediastinal contours are within normal limits. Both lungs are clear. The visualized skeletal structures are unremarkable.  IMPRESSION: No active disease.   Electronically Signed   By: Kathreen Devoid   On: 06/22/2014 14:46    CT of the brain  no acute abnormality  MRI of the brain  pending  MRA of the brain  pending  Carotid Doppler pending  2D Echocardiogram done results pending  CXR  No active disease.   EKG done  Therapy Recommendations pending Physical Exam   Obese middle-age Caucasian lady currently not in distress.Awake alert. Afebrile. Head is nontraumatic. Neck is supple without bruit. Hearing is normal. Cardiac exam no murmur or gallop. Lungs are clear to auscultation. Distal pulses are well felt. Neurological  Exam :  Awake  Alert oriented x 3. Normal speech and language.eye movements full without nystagmus.fundi were not visualized. Vision acuity and fields appear normal. Hearing is normal. Palatal movements are normal. Face symmetric. Tongue midline. Normal strength, tone, reflexes and coordination. Normal sensation. Gait deferred. NIHSS zero ASSESSMENT Andrea [name] is a 48 y.o. 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 CT head is negative an MRI scan is pending    On aspirin 325 mg orally every day and clopidogrel 75 mg orally every day prior to admission. Now on no for secondary stroke prevention. Patient with resultant no deficits.  Stroke work up underway. Patient enrolled in the PRISMS trial of IV TPA compared to placebo for patients with mild ischemic stroke with resolving symptoms   There is vascular disease status post bypass surgery and stents  LDL 100  Bilateral carotid disease status post bilateral carotid surgery and known right carotid occlusion  H.o AFIB documented previously in chart but patient denies this and states it was ectopic heart beats only   Hospital day # 1  TREATMENT/PLAN  Continue   aspirin 325 mg orally every day and clopidogrel 75 mg orally every day for secondary stroke prevention.  Check MRI scan of the brain, MRA, carotid Dopplers, 2-D echo  Mobilize out of bed, physical and occupational therapy.  Long discussion with the patient, husband and son regarding her presentation, evaluation plan, treatment and answered questions  SIGNED Antony Contras, MD     To contact Stroke Continuity provider, please refer to [email or web address] After hours, contact General Neurology

## 2014-06-23 NOTE — Progress Notes (Signed)
SLP Cancellation Note  Patient Details Name: Andrea [name] MRN: [id number] DOB: 11-06-1966   Cancelled treatment:       Reason Eval/Treat Not Completed: SLP screened, no needs identified, will sign off   Juan Quam Laurice 06/23/2014, 9:55 AM

## 2014-06-23 NOTE — Progress Notes (Signed)
OT Cancellation Note  Patient Details Name: Andrea [name] MRN: [id number] DOB: May 16, 1966   Cancelled Treatment:    Reason Eval/Treat Not Completed: Patient not medically ready (Bedrest) Please update activity orders when appropriate for therapy. Thanks [place], OTR/L  [phone] 06/23/2014 06/23/2014, 9:32 AM

## 2014-06-23 NOTE — Progress Notes (Signed)
Inpatient Diabetes Program Recommendations  AACE/ADA: New Consensus Statement on Inpatient Glycemic Control (2013)  Target Ranges:  Prepandial:   less than 140 mg/dL      Peak postprandial:   less than 180 mg/dL (1-2 hours)      Critically ill patients:  140 - 180 mg/dL     Results for Andrea [name], Andrea [name] (MRN [id number]) as of 06/23/2014 11:02  Ref. Range 06/22/2014 15:26 06/22/2014 17:27 06/22/2014 21:04 06/23/2014 07:49  Glucose-Capillary Latest Range: 70-99 mg/dL 235 (H) 169 (H) 230 (H) 169 (H)     Admitted with CVA.  History of DM, HTN  Home DM Meds: Glipizide 10 mg daily + Metformin 750 mg bid   MD- Please start Novolog Sensitive SSI     Will follow Wyn Quaker RN, MSN, CDE Diabetes Coordinator Inpatient Diabetes Program Team Pager: [phone] (8a-10p)

## 2014-06-24 ENCOUNTER — Other Ambulatory Visit: Payer: Self-pay | Admitting: Neurology

## 2014-06-24 DIAGNOSIS — G451 Carotid artery syndrome (hemispheric): Secondary | ICD-10-CM

## 2014-06-24 LAB — GLUCOSE, CAPILLARY
Glucose-Capillary: 172 mg/dL — ABNORMAL HIGH (ref 70–99)
Glucose-Capillary: 167 mg/dL — ABNORMAL HIGH (ref 70–99)

## 2014-06-24 MED ORDER — SIMVASTATIN 20 MG PO TABS: 20.0000 mg | ORAL_TABLET | Freq: Every day | ORAL | Status: DC

## 2014-06-24 MED ORDER — POTASSIUM CHLORIDE CRYS ER 20 MEQ PO TBCR
20.0000 meq | EXTENDED_RELEASE_TABLET | Freq: Two times a day (BID) | ORAL | Status: DC
Start: 2014-06-24 — End: 2014-06-24
  Administered 2014-06-24: 20 meq via ORAL
  Filled 2014-06-24: qty 1

## 2014-06-24 NOTE — Progress Notes (Signed)
Stroke Team Progress Note  HISTORY Andrea [name] is a 48 y.o. female who was at home and feeling normal while on her computer. At 1145 on 06/22/14 she noted she felt dizzy and then her left arm was clumsy. She tried to get up and then slumped to the left and was weak in her left leg. She sat there for a moment and her symptoms seemed to clear but she still noted decreased sensation on her left arm and leg. On arrival to CT she continued to have decreased sensation in left arm and leg. When examined she stated her symptoms were improving.   Of note: As of April 2015, She has had right and left CEA with post op occlusion right common carotid and ICA She also has 60-79% left carotid stenosis. Followed by Dr. Scot Dock with follow up appt in October.   Date last known well: Date: 06/22/2014  Time last known well: Time: 11:45  tPA Given: No: minimal symptoms and symptoms improving  NIHSS 2  Patient was not administered TPA secondary to resolving and mild symptoms but was in the PRISMS trial of IV TPA versus placebo for patients with mild and improving stroke symptoms. She was admitted to the neuro ICU  for further evaluation and treatment.  SUBJECTIVE No new issues.Discussed benefits of TCDs to be performed in office. To evaluate left ICA asymptomatic stenosis with vasoreactivity and emboli monitoring  OBJECTIVE Most recent Vital Signs: Filed Vitals:   06/23/14 1825 06/23/14 2116 06/24/14 0152 06/24/14 0621  BP: 134/74 134/81 129/63 141/60  Pulse: 74 72 68 60  Temp: 98.4 F (36.9 C) 98.5 F (36.9 C) 98.1 F (36.7 C) 98.1 F (36.7 C)  TempSrc: Oral Oral Oral Oral  Resp: 20 20 18 20   Height:      Weight:      SpO2: 96% 100% 100% 98%   CBG (last 3)   Recent Labs  06/23/14 1655 06/23/14 2111 06/24/14 0709  GLUCAP 120* 157* 172*    IV Fluid Intake:      MEDICATIONS  . amLODipine  10 mg Oral Daily  . aspirin  325 mg Oral Daily  . clopidogrel  75 mg Oral Daily  . docusate sodium   100 mg Oral BID  . fenofibrate  54 mg Oral Daily  . glipiZIDE  10 mg Oral Q breakfast  . metFORMIN  750 mg Oral BID WC  . pantoprazole  40 mg Oral Daily  . simvastatin  20 mg Oral QHS  . venlafaxine XR  150 mg Oral Q breakfast   PRN:  acetaminophen, acetaminophen, ALPRAZolam, labetalol, senna-docusate  Diet:  Cardiac with thin liquids Activity:  Up with assistance DVT Prophylaxis:  None as enrolled in the present study of IV TPA versus placebo  CLINICALLY SIGNIFICANT STUDIES Basic Metabolic Panel:   Recent Labs Lab 06/22/14 1325 06/22/14 1335  NA 139 139  K 3.7 3.5*  CL 98 103  CO2 24  --   GLUCOSE 262* 262*  BUN 11 9  CREATININE 0.52 0.60  CALCIUM 10.2  --    Liver Function Tests:   Recent Labs Lab 06/22/14 1325  AST 28  ALT 47*  ALKPHOS 93  BILITOT 0.3  PROT 7.5  ALBUMIN 4.2   CBC:   Recent Labs Lab 06/22/14 1325 06/22/14 1335  WBC 7.2  --   NEUTROABS 3.7  --   HGB 13.7 15.0  HCT 40.3 44.0  MCV 87.2  --   PLT 290  --  Coagulation:   Recent Labs Lab 06/22/14 1325  LABPROT 12.9  INR 0.97   Cardiac Enzymes: No results found for this basename: CKTOTAL, CKMB, CKMBINDEX, TROPONINI,  in the last 168 hours Urinalysis: No results found for this basename: COLORURINE, APPERANCEUR, LABSPEC, PHURINE, GLUCOSEU, HGBUR, BILIRUBINUR, KETONESUR, PROTEINUR, UROBILINOGEN, NITRITE, LEUKOCYTESUR,  in the last 168 hours Lipid Panel    Component Value Date/Time   CHOL 181 06/23/2014 0301   TRIG 235* 06/23/2014 0301   HDL 34* 06/23/2014 0301   CHOLHDL 5.3 06/23/2014 0301   VLDL 47* 06/23/2014 0301   LDLCALC 100* 06/23/2014 0301   HgbA1C  Lab Results  Component Value Date   HGBA1C 7.5* 06/23/2014    Urine Drug Screen:   No results found for this basename: labopia,  cocainscrnur,  labbenz,  amphetmu,  thcu,  labbarb    Alcohol Level: No results found for this basename: ETH,  in the last 168 hours  Ct Head (brain) Wo Contrast 06/22/2014    No acute intracranial  process; if clinical suspicion persists for acute ischemia, MRI of the brain with diffusion-weighted sequences would be more sensitive.     MRI / MRA Brain Wo Contrast 06/23/2014    1. The right internal carotid artery is occluded. It is reconstituted at the level of the posterior communicating artery.  2. No evidence for acute or remote infarct.  3. The left internal carotid artery and posterior circulation are within normal limits.      Dg Chest Port 1 View 06/22/2014    No active disease.      CT of the brain  no acute abnormality  Carotid Doppler  Findings consistent with occlusion involving the right common carotid artery and the right internal carotid artery. - Findings consistent with 60 - 79 percent stenosis involving the left internal carotid artery. - Bilateral vertebral artery antegrade.   2D Echocardiogram - ejection fraction 60-65%. No cardiac source of emboli identified  EKG - sinus rhythm rate 85 beats per minute. Please refer to the formal reading for complete details.  Therapy Recommendations - no followup occupational or physical therapy recommended   Physical Exam   Obese middle-age Caucasian lady currently not in distress.Awake alert. Afebrile. Head is nontraumatic. Neck is supple without bruit. Hearing is normal. Cardiac exam no murmur or gallop. Lungs are clear to auscultation. Distal pulses are well felt. Neurological Exam :  Awake  Alert oriented x 3. Normal speech and language.eye movements full without nystagmus.fundi were not visualized. Vision acuity and fields appear normal. Hearing is normal. Palatal movements are normal. Face symmetric. Tongue midline. Normal strength, tone, reflexes and coordination. Normal sensation. Gait deferred. NIHSS zero   ASSESSMENT Andrea [name] is a 48 y.o. 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 CT head is negative. MRI -no evidence for  acute or remote infarct.  On aspirin 325 mg orally every day and clopidogrel 75 mg orally every day prior to admission. Now on aspirin 325 mg daily and Plavix 75 mg daily for secondary stroke prevention. Patient with resultant no deficits. Stroke work up underway. Patient enrolled in the PRISMS trial of IV TPA compared to placebo for patients with mild ischemic stroke with resolving symptoms   There is vascular disease status post bypass surgery and stents  Cholesterol 181; LDL 100 - Zocor 10 mg daily prior to admission and currently.  Bilateral carotid disease status post bilateral carotid surgery and known right carotid occlusion.  H.o AFIB documented previously in chart but patient denies this and states it was ectopic heart beats only  Diabetes mellitus  - Hemoglobin A1c 7.5 - goal is less than 7  Hypertension history  Participating in the [facility] day # 2  TREATMENT/PLAN  Continue   aspirin 325 mg orally every day and clopidogrel 75 mg orally every day for 3 months then 1 agent alone for secondary stroke prevention.  Needs better diabetic control - follow up with primary M.D. for recommendations.  Increase Zocor to 20 mg daily for LDL of 100  Discharge home today  Followup in the office with Dr.Xu and TCD vasoreactivity and emboli monitoring  Followup in 3 months with Dr. Leonie Man for PRISMS trial in 3 months   Mikey Bussing PA-C [facility] Pager [phone] 06/24/2014, 8:28 AM I have personally examined this patient, reviewed imaging studies, lab results, developed plan of care and agree with above  SIGNED Antony Contras, MD     To contact Stroke Continuity provider, please refer to [email or web address] After hours, contact General Neurology

## 2014-06-24 NOTE — Progress Notes (Signed)
Discharge orders received. Patient given verbal and written instructions to follow up with PCP, Dr Leonie Man and Dr Phoebe Sharps office. Medications reviewed and patient given stroke education. Discharged home with family members in stable condition.

## 2014-07-07 ENCOUNTER — Encounter: Payer: Self-pay | Admitting: Family Medicine

## 2014-07-07 ENCOUNTER — Other Ambulatory Visit: Payer: Self-pay | Admitting: Neurology

## 2014-07-07 ENCOUNTER — Ambulatory Visit (INDEPENDENT_AMBULATORY_CARE_PROVIDER_SITE_OTHER): Payer: Medicare PPO | Admitting: Family Medicine

## 2014-07-07 VITALS — BP 155/81 | HR 69 | Temp 98.8°F | Resp 16 | Ht 64.0 in | Wt 162.0 lb

## 2014-07-07 DIAGNOSIS — E785 Hyperlipidemia, unspecified: Secondary | ICD-10-CM

## 2014-07-07 DIAGNOSIS — E1165 Type 2 diabetes mellitus with hyperglycemia: Secondary | ICD-10-CM

## 2014-07-07 DIAGNOSIS — I6529 Occlusion and stenosis of unspecified carotid artery: Secondary | ICD-10-CM

## 2014-07-07 DIAGNOSIS — Z09 Encounter for follow-up examination after completed treatment for conditions other than malignant neoplasm: Secondary | ICD-10-CM

## 2014-07-07 DIAGNOSIS — IMO0001 Reserved for inherently not codable concepts without codable children: Secondary | ICD-10-CM

## 2014-07-07 DIAGNOSIS — I10 Essential (primary) hypertension: Secondary | ICD-10-CM

## 2014-07-07 DIAGNOSIS — R2990 Unspecified symptoms and signs involving the nervous system: Secondary | ICD-10-CM

## 2014-07-07 DIAGNOSIS — R29818 Other symptoms and signs involving the nervous system: Secondary | ICD-10-CM

## 2014-07-07 DIAGNOSIS — J0120 Acute ethmoidal sinusitis, unspecified: Secondary | ICD-10-CM

## 2014-07-07 MED ORDER — LISINOPRIL 5 MG PO TABS: 5.0000 mg | ORAL_TABLET | Freq: Every day | ORAL | Status: DC

## 2014-07-07 MED ORDER — AMOXICILLIN-POT CLAVULANATE 875-125 MG PO TABS: 1.0000 | ORAL_TABLET | Freq: Two times a day (BID) | ORAL | Status: DC

## 2014-07-07 MED ORDER — METFORMIN HCL 1000 MG PO TABS: 1000.0000 mg | ORAL_TABLET | Freq: Two times a day (BID) | ORAL | Status: DC

## 2014-07-08 LAB — COMPLETE METABOLIC PANEL WITHOUT GFR
AST: 48 U/L — ABNORMAL HIGH (ref 0–37)
BUN: 11 mg/dL (ref 6–23)
Creat: 0.61 mg/dL (ref 0.50–1.10)
GFR, Est African American: >89 mL/min

## 2014-07-08 LAB — COMPLETE METABOLIC PANEL WITH GFR
ALT: 37 U/L — ABNORMAL HIGH (ref 0–35)
Albumin: 4.8 g/dL (ref 3.5–5.2)
Alkaline Phosphatase: 72 U/L (ref 39–117)
CO2: 24 mEq/L (ref 19–32)
Calcium: 10.4 mg/dL (ref 8.4–10.5)
Chloride: 101 mEq/L (ref 96–112)
GFR, Est Non African American: >89 mL/min
Glucose, Bld: 108 mg/dL — ABNORMAL HIGH (ref 70–99)
Potassium: 5 mEq/L (ref 3.5–5.3)
Sodium: 139 mEq/L (ref 135–145)
Total Bilirubin: 0.5 mg/dL (ref 0.2–1.2)
Total Protein: 7.8 g/dL (ref 6.0–8.3)

## 2014-07-08 NOTE — Discharge Summary (Signed)
Stroke Discharge Summary  Patient ID: Andrea [name]   MRN: [id number]      DOB: [birth year]  Date of Admission: 06/22/2014 Date of Discharge: 07/08/2014  Attending Physician:  No att. providers found, Stroke MD  Consulting Physician(s):     None  Patient's PCP:  LE, Stryker, DO  Discharge Diagnoses:  Active Problems:   Small Stroke vs TIA   CVA (cerebral infarction)  BMI: Body mass index is 28.07 kg/(m^2).  Past Medical History  Diagnosis Date  . Hypertension   . Anxiety     on Alprazolam  . Depression     on Effexor  . Diabetes mellitus without complication     2440  . Heart murmur     was seen by cardiologist in [facility] 2013  . Allergy   . Atrial fibrillation   . Peripheral vascular disease    Past Surgical History  Procedure Laterality Date  . Cholecystectomy    . Cesarean section    . Tubal ligation    . Femoral bypass      x 2 on right side due to re-stenosis  . Left/right carotid artery      2003 and 2009 for right, right side is 100% occluded per patient; left side 2009  . 2 stents    . Carotid endarterectomy        Medication List    STOP taking these medications       metFORMIN 750 MG 24 hr tablet  Commonly known as:  GLUCOPHAGE XR      TAKE these medications       ALPRAZolam 0.5 MG tablet  Commonly known as:  XANAX  Take 1 tablet (0.5 mg total) by mouth 3 (three) times daily as needed for anxiety. Can only refill after every 28 days     amLODipine 10 MG tablet  Commonly known as:  NORVASC  Take 1 tablet (10 mg total) by mouth daily.     aspirin 325 MG tablet  Take 325 mg by mouth daily.     clopidogrel 75 MG tablet  Commonly known as:  PLAVIX  Take 1 tablet (75 mg total) by mouth daily with breakfast.     fenofibrate 54 MG tablet  Take 1 tablet (54 mg total) by mouth daily.     glipiZIDE 10 MG 24 hr tablet  Commonly known as:  GLUCOTROL XL  Take 1 tablet (10 mg total) by mouth daily with breakfast.     simvastatin  20 MG tablet  Commonly known as:  ZOCOR  Take 1 tablet (20 mg total) by mouth at bedtime.     venlafaxine XR 150 MG 24 hr capsule  Commonly known as:  EFFEXOR-XR  Take 1 capsule (150 mg total) by mouth daily with breakfast.        LABORATORY STUDIES CBC    Component Value Date/Time   WBC 7.2 06/22/2014 1325   RBC 4.62 06/22/2014 1325   HGB 15.0 06/22/2014 1335   HCT 44.0 06/22/2014 1335   PLT 290 06/22/2014 1325   MCV 87.2 06/22/2014 1325   MCH 29.7 06/22/2014 1325   MCHC 34.0 06/22/2014 1325   RDW 13.7 06/22/2014 1325   LYMPHSABS 2.7 06/22/2014 1325   MONOABS 0.6 06/22/2014 1325   EOSABS 0.1 06/22/2014 1325   BASOSABS 0.1 06/22/2014 1325   CMP    Component Value Date/Time   NA 139 07/07/2014 1206   K 5.0 07/07/2014 1206  CL 101 07/07/2014 1206   CO2 24 07/07/2014 1206   GLUCOSE 108* 07/07/2014 1206   BUN 11 07/07/2014 1206   CREATININE 0.61 07/07/2014 1206   CREATININE 0.60 06/22/2014 1335   CALCIUM 10.4 07/07/2014 1206   PROT 7.8 07/07/2014 1206   ALBUMIN 4.8 07/07/2014 1206   AST 48* 07/07/2014 1206   ALT 37* 07/07/2014 1206   ALKPHOS 72 07/07/2014 1206   BILITOT 0.5 07/07/2014 1206   GFRNONAA >89 07/07/2014 1206   GFRNONAA >90 06/22/2014 1325   GFRAA >89 07/07/2014 1206   GFRAA >90 06/22/2014 1325   COAGS Lab Results  Component Value Date   INR 0.97 06/22/2014   INR 0.9 03/23/2008   Lipid Panel    Component Value Date/Time   CHOL 181 06/23/2014 0301   TRIG 235* 06/23/2014 0301   HDL 34* 06/23/2014 0301   CHOLHDL 5.3 06/23/2014 0301   VLDL 47* 06/23/2014 0301   LDLCALC 100* 06/23/2014 0301   HgbA1C  Lab Results  Component Value Date   HGBA1C 7.5* 06/23/2014   Cardiac Panel (last 3 results) No results found for this basename: CKTOTAL, CKMB, TROPONINI, RELINDX,  in the last 72 hours Urinalysis    Component Value Date/Time   COLORURINE YELLOW 03/23/2008 0910   APPEARANCEUR CLEAR 03/23/2008 0910   LABSPEC 1.007 03/23/2008 0910   PHURINE 6.5 03/23/2008 0910   GLUCOSEU NEGATIVE 03/23/2008 0910   HGBUR NEGATIVE  03/23/2008 0910   BILIRUBINUR NEGATIVE 03/23/2008 0910   KETONESUR NEGATIVE 03/23/2008 0910   PROTEINUR NEGATIVE 03/23/2008 0910   UROBILINOGEN 0.2 03/23/2008 0910   NITRITE NEGATIVE 03/23/2008 0910   LEUKOCYTESUR NEGATIVE MICROSCOPIC NOT DONE ON URINES WITH NEGATIVE PROTEIN, BLOOD, LEUKOCYTES, NITRITE, OR GLUCOSE <1000 mg/dL. 03/23/2008 0910   Urine Drug Screen  No results found for this basename: labopia, cocainscrnur, labbenz, amphetmu, thcu, labbarb    Alcohol Level No results found for this basename: eth     SIGNIFICANT DIAGNOSTIC STUDIES  Ct Head (brain) Wo Contrast  06/22/2014  No acute intracranial process; if clinical suspicion persists for acute ischemia, MRI of the brain with diffusion-weighted sequences would be more sensitive.   MRI / MRA Brain Wo Contrast  06/23/2014  1. The right internal carotid artery is occluded. It is reconstituted at the level of the posterior communicating artery.  2. No evidence for acute or remote infarct.  3. The left internal carotid artery and posterior circulation are within normal limits.   Dg Chest Port 1 View  06/22/2014  No active disease.   CT of the brain no acute abnormality   Carotid Doppler  Findings consistent with occlusion involving the right common carotid artery and the right internal carotid artery. - Findings consistent with 60 - 79 percent stenosis involving the left internal carotid artery. - Bilateral vertebral artery antegrade.   2D Echocardiogram - ejection fraction 60-65%. No cardiac source of emboli identified   EKG - sinus rhythm rate 85 beats per minute. Please refer to the formal reading for complete details.     History of Present Illness   Andrea [name] is a 48 y.o. female who was at home and feeling normal while on her computer. At 1145 on 06/22/14 she noted she felt dizzy and then her left arm was clumsy. She tried to get up and then slumped to the left and was weak in her left leg. She sat there for a moment and her  symptoms seemed to clear but she still noted decreased sensation on her  left arm and leg. On arrival to CT she continued to have decreased sensation in left arm and leg. When examined she stated her symptoms were improving.   Of note: As of April 2015, She has had right and left CEA with post op occlusion right common carotid and ICA She also has 60-79% left carotid stenosis. Followed by Dr. Scot Dock with follow up appt in October.  Hospital Course  The patient was seen in the emergency department with symptoms suggestive of a right CVA. She was not administered TPA secondary to resolving and mild symptoms but was enrolled in the PRISMS trial of IV TPA versus placebo for patients with mild and improving stroke symptoms. She was admitted to the neuro ICU for further evaluation and treatment. The standard stroke workup was initiated. The patient felt that her symptoms began to improve after receiving the intravenous infusion of the study medication. Dr. Leonie Man had a long discussion with the patient, her husband, and her son regarding the patient's condition and the treatment plan. She was evaluated by the physical therapist did not feel that any further therapy was indicated. The patient was transferred out of the intensive care unit to the regular floor bed on 06/23/2014. She was seen once again by Dr. Leonie Man on 06/24/2014 and was felt to be stable and improved for discharge. Dr. Leonie Man recommended TCDs to be performed in the office to evaluate the left internal carotid artery asymptomatic stenosis.    Patient with vascular risk factors of:   Bilateral carotid artery disease  History of atrial fibrillation previously documented; however, the patient denied this stating that she only had ectopic heartbeats.  Diabetes mellitus  Elevated LDL  Hypertension  PAD  Obesity   Patient also with:  Coronary artery disease  Anxiety/depression  Patient's stroke symptoms resolved.  Physical therapy,  occupational therapy and speech therapy evaluated patient. They recommend no further therapy.  Discharge Exam  Blood pressure 138/55, pulse 76, temperature 98.6 F (37 C), temperature source Oral, resp. rate 18, height 5\' 4"  (1.626 m), weight 163 lb 9.6 oz (74.208 kg), SpO2 96.00%.  Obese middle-age Caucasian lady currently not in distress.Awake alert. Afebrile. Head is nontraumatic. Neck is supple without bruit. Hearing is normal. Cardiac exam no murmur or gallop. Lungs are clear to auscultation. Distal pulses are well felt.  Neurological Exam : Awake Alert oriented x 3. Normal speech and language.eye movements full without nystagmus.fundi were not visualized. Vision acuity and fields appear normal. Hearing is normal. Palatal movements are normal. Face symmetric. Tongue midline.  Normal strength, tone, reflexes and coordination. Normal sensation. Gait deferred.  NIHSS zero  Discharge Diet     heart healthy carb modified diet with thin liquids  Discharge Plan    Disposition:  Discharged to home.  aspirin 325 mg orally every day and clopidogrel 75 mg orally every day for secondary stroke prevention.  Ongoing risk factor control by Primary Care Physician. Risk factor recommendations:  Hypertension target range 130-140/70-80 Lipid range - LDL < 70 in diabetic patients and checked every 6 months, fasting. Weight loss Diabetes - HgB A1C <7   Follow-up LE, THAO PHUONG, DO in 2 weeks.  Follow-up with Dr. Antony Contras, Stroke Clinic in 2 months.  TCDs for further monitoring of left internal carotid artery stenosis.  >30 minutes were spent preparing discharge.   Signed Mikey Bussing PA-C [facility] Pager [phone] 07/08/2014, 1:59 PM I have personally examined this patient, reviewed chart, developed plan of care and agree  with above  Antony Contras, MD

## 2014-07-10 NOTE — Progress Notes (Signed)
Chief Complaint:  Chief Complaint  Patient presents with  . Follow-up    follow up on stroke on 06/22/14   . Sinusitis    x 1 week pressure,  greendrainage, popping behind ears when she swallows     HPI: Andrea [name] is a 48 y.o. female who is here for  Hospital followup from 06/22/2014. She went into the ER for stroke like sxs and prior to going to the ED she stopped in at a fastfood place and had food before going in because she "did not know how long they were going to keep me and I know they were not going to feed me" She did not have an acute or remote CVA on imaging studies. She has significant PAD. She was seen by neurology, they did increase her zocor to get her LDL to less than 100 but again she ahd eaten and when she was on her meds 1 month ago the LDL was 87.   She also complains of  1 week hx sinus sxs, facial pain, ear poping and greensih dc . No fevers or chills.   Please see neurology note A/P below:  ASSESSMENT  Ms. Andrea [name] is a 48 y.o. 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 CT head is negative. MRI -no evidence for acute or remote infarct. On aspirin 325 mg orally every day and clopidogrel 75 mg orally every day prior to admission. Now on aspirin 325 mg daily and Plavix 75 mg daily for secondary stroke prevention. Patient with resultant no deficits. Stroke work up underway. Patient enrolled in the PRISMS trial of IV TPA compared to placebo for patients with mild ischemic stroke with resolving symptoms  There is vascular disease status post bypass surgery and stents  Cholesterol 181; LDL 100 - Zocor 10 mg daily prior to admission and currently.  Bilateral carotid disease status post bilateral carotid surgery and known right carotid occlusion.  H.o AFIB documented previously in chart but patient denies this and states it was ectopic heart beats only  Diabetes mellitus -  Hemoglobin A1c 7.5 - goal is less than 7  Hypertension history  Participating in the [facility] day # 2  TREATMENT/PLAN  Continue aspirin 325 mg orally every day and clopidogrel 75 mg orally every day for 3 months then 1 agent alone for secondary stroke prevention.  Needs better diabetic control - follow up with primary M.D. for recommendations.  Increase Zocor to 20 mg daily for LDL of 100  Discharge home today  Followup in the office with Dr.Xu and TCD vasoreactivity and emboli monitoring  Followup in 3 months with Dr. Leonie Man for PRISMS trial in 3 months   Past Medical History  Diagnosis Date  . Hypertension   . Anxiety     on Alprazolam  . Depression     on Effexor  . Diabetes mellitus without complication     5284  . Heart murmur     was seen by cardiologist in [facility] 2013  . Allergy   . Atrial fibrillation   . Peripheral vascular disease    Past Surgical History  Procedure Laterality Date  . Cholecystectomy    . Cesarean section    . Tubal ligation    . Femoral bypass      x 2 on right side due to re-stenosis  . Left/right carotid artery      2003 and 2009 for  right, right side is 100% occluded per patient; left side 2009  . 2 stents    . Carotid endarterectomy     History   Social History  . Marital Status: Married    Spouse Name: N/A    Number of Children: N/A  . Years of Education: N/A   Social History Main Topics  . Smoking status: Former Smoker    Quit date: 12/22/2010  . Smokeless tobacco: None  . Alcohol Use: Yes     Comment: holidays/special occas 3-4 drinks  . Drug Use: No  . Sexual Activity: None   Other Topics Concern  . None   Social History Narrative  . None   Family History  Problem Relation Age of Onset  . Diabetes Mother   . Heart disease Mother   . Hyperlipidemia Mother   . Hypertension Mother   . Heart disease Father   . Hyperlipidemia Father   . Hypertension Father   . Diabetes Maternal Grandmother   .  Heart disease Maternal Grandmother   . Hyperlipidemia Maternal Grandmother   . Cancer Paternal Grandmother   . Cancer Sister   . Varicose Veins Sister   . Heart attack Sister    Allergies  Allergen Reactions  . Other Hives    Contrast dye    Prior to Admission medications   Medication Sig Start Date End Date Taking? Authorizing Provider  ALPRAZolam Andrea [name]) 0.5 MG tablet Take 1 tablet (0.5 mg total) by mouth 3 (three) times daily as needed for anxiety. Can only refill after every 28 days 06/09/14  Yes Mikyah Alamo P Nela Bascom, DO  amLODipine (NORVASC) 10 MG tablet Take 1 tablet (10 mg total) by mouth daily. 06/09/14  Yes Riese Hellard P Starr Engel, DO  aspirin 325 MG tablet Take 325 mg by mouth daily.   Yes Historical Provider, MD  clopidogrel (PLAVIX) 75 MG tablet Take 1 tablet (75 mg total) by mouth daily with breakfast. 06/09/14  Yes Maliah Pyles P Tiari Andringa, DO  fenofibrate 54 MG tablet Take 1 tablet (54 mg total) by mouth daily. 06/09/14  Yes Sakoya Win P Donathan Buller, DO  glipiZIDE (GLUCOTROL XL) 10 MG 24 hr tablet Take 1 tablet (10 mg total) by mouth daily with breakfast. 06/09/14  Yes Jaylee Lantry P Beatriz Settles, DO  simvastatin (ZOCOR) 20 MG tablet Take 1 tablet (20 mg total) by mouth at bedtime. 06/24/14  Yes David L Rinehuls, PA-C  venlafaxine XR (EFFEXOR-XR) 150 MG 24 hr capsule Take 1 capsule (150 mg total) by mouth daily with breakfast. 06/09/14  Yes Serenity Fortner P Shali Vesey, DO  amoxicillin-clavulanate (AUGMENTIN) 875-125 MG per tablet Take 1 tablet by mouth 2 (two) times daily. 07/07/14   Renatta Shrieves P Rykin Route, DO  lisinopril (PRINIVIL,ZESTRIL) 5 MG tablet Take 1 tablet (5 mg total) by mouth daily. 07/07/14   Fardowsa Authier P Hagar Sadiq, DO  metFORMIN (GLUCOPHAGE) 1000 MG tablet Take 1 tablet (1,000 mg total) by mouth 2 (two) times daily with a meal. 07/07/14   Pryce Folts P Libbi Towner, DO     ROS: The patient denies fevers, chills, night sweats, unintentional weight loss, chest pain, palpitations, wheezing, dyspnea on exertion, nausea, vomiting, abdominal pain, dysuria, hematuria, melena, numbness, weakness, or tingling.    All other systems have been reviewed and were otherwise negative with the exception of those mentioned in the HPI and as above.    PHYSICAL EXAM: Filed Vitals:   07/07/14 1120  BP: 155/81  Pulse: 69  Temp: 98.8 F (37.1 C)  Resp: 16   Filed Vitals:  07/07/14 1120  Height: 5\' 4"  (1.626 m)  Weight: 162 lb (73.483 kg)   Body mass index is 27.79 kg/(m^2).  General: Alert, no acute distress HEENT:  Normocephalic, atraumatic, oropharynx patent. EOMI, PERRLA. TM nl, + sinus tenderness, no exudates Cardiovascular:  Regular rate and rhythm, no rubs murmurs or gallops.  + Carotid bruits, radial pulse intact. No pedal edema.  Respiratory: Clear to auscultation bilaterally.  No wheezes, rales, or rhonchi.  No cyanosis, no use of accessory musculature GI: No organomegaly, abdomen is soft and non-tender, positive bowel sounds.  No masses. Skin: No rashes. Neurologic: Facial musculature symmetric. Psychiatric: Patient is appropriate throughout our interaction. Lymphatic: No cervical lymphadenopathy Musculoskeletal: Gait intact. 5/5 strength, 2/2 DTRs   LABS: Results for orders placed in visit on 07/07/14  COMPLETE METABOLIC PANEL WITH GFR      Result Value Ref Range   Sodium 139  135 - 145 mEq/L   Potassium 5.0  3.5 - 5.3 mEq/L   Chloride 101  96 - 112 mEq/L   CO2 24  19 - 32 mEq/L   Glucose, Bld 108 (*) 70 - 99 mg/dL   BUN 11  6 - 23 mg/dL   Creat 0.61  0.50 - 1.10 mg/dL   Total Bilirubin 0.5  0.2 - 1.2 mg/dL   Alkaline Phosphatase 72  39 - 117 U/L   AST 48 (*) 0 - 37 U/L   ALT 37 (*) 0 - 35 U/L   Total Protein 7.8  6.0 - 8.3 g/dL   Albumin 4.8  3.5 - 5.2 g/dL   Calcium 10.4  8.4 - 10.5 mg/dL   GFR, Est African American >89     GFR, Est Non African American >89       EKG/XRAY:   Primary read interpreted by Dr. Marin Comment at [facility].   ASSESSMENT/PLAN: Encounter Diagnoses  Name Primary?  [facility] Yes  . Stroke-like symptoms   . Essential hypertension     . Hyperlipidemia   . Type II or unspecified type diabetes mellitus without mention of complication, uncontrolled   . Acute ethmoidal sinusitis, recurrence not specified    Refill meds She will cntinue with zocor 20 mg She will take metfromin 1 gram BID and glipizide 10 mg daily, prior she was only on mefromin 750 mg BID Additionally we recently just changed her to metformin BID, prior to last OV with me she was on metformin daily and glipizide BID from her prior doctor in [place] Since BP is not well controlled based on flow sheet and for better renal protection from DM will go ahead and add llow dose lisinopril 5 mg daily F/u with neurology as scheduled CMP recheck for history of hypokalemia per patient in hospital F/u in 2 months   Gross sideeffects, risk and benefits, and alternatives of medications d/w patient. Patient is aware that all medications have potential sideeffects and we are unable to predict every sideeffect or drug-drug interaction that may occur.  Doretta Remmert, [place], DO 07/10/2014 7:46 AM

## 2014-07-13 ENCOUNTER — Ambulatory Visit (INDEPENDENT_AMBULATORY_CARE_PROVIDER_SITE_OTHER): Payer: Medicare PPO

## 2014-07-13 ENCOUNTER — Other Ambulatory Visit: Payer: Medicare PPO

## 2014-07-13 ENCOUNTER — Telehealth: Payer: Self-pay | Admitting: Radiology

## 2014-07-13 ENCOUNTER — Ambulatory Visit (INDEPENDENT_AMBULATORY_CARE_PROVIDER_SITE_OTHER): Payer: Self-pay

## 2014-07-13 DIAGNOSIS — Z0289 Encounter for other administrative examinations: Secondary | ICD-10-CM

## 2014-07-13 DIAGNOSIS — G451 Carotid artery syndrome (hemispheric): Secondary | ICD-10-CM

## 2014-07-13 DIAGNOSIS — I6529 Occlusion and stenosis of unspecified carotid artery: Secondary | ICD-10-CM

## 2014-07-13 NOTE — Telephone Encounter (Signed)
Unable to complete test due to absent bi-temporal windows.

## 2014-08-29 ENCOUNTER — Encounter (INDEPENDENT_AMBULATORY_CARE_PROVIDER_SITE_OTHER): Payer: Self-pay

## 2014-08-29 ENCOUNTER — Encounter: Payer: Self-pay | Admitting: Neurology

## 2014-08-29 ENCOUNTER — Ambulatory Visit (INDEPENDENT_AMBULATORY_CARE_PROVIDER_SITE_OTHER): Payer: Medicare PPO | Admitting: Neurology

## 2014-08-29 VITALS — BP 121/71 | HR 73 | Ht 63.0 in | Wt 158.6 lb

## 2014-08-29 DIAGNOSIS — E785 Hyperlipidemia, unspecified: Secondary | ICD-10-CM

## 2014-08-29 DIAGNOSIS — I10 Essential (primary) hypertension: Secondary | ICD-10-CM

## 2014-08-29 DIAGNOSIS — E118 Type 2 diabetes mellitus with unspecified complications: Secondary | ICD-10-CM

## 2014-08-29 DIAGNOSIS — G459 Transient cerebral ischemic attack, unspecified: Secondary | ICD-10-CM

## 2014-08-29 DIAGNOSIS — I6529 Occlusion and stenosis of unspecified carotid artery: Secondary | ICD-10-CM

## 2014-08-29 HISTORY — DX: Essential (primary) hypertension: I10

## 2014-08-29 HISTORY — DX: Type 2 diabetes mellitus with unspecified complications: E11.8

## 2014-08-29 HISTORY — DX: Hyperlipidemia, unspecified: E78.5

## 2014-08-29 NOTE — Progress Notes (Signed)
STROKE NEUROLOGY FOLLOW UP NOTE  NAME: Andrea [name] DOB: 1966-06-20  REASON FOR VISIT: stroke follow up HISTORY FROM: pt and chart  Today we had the pleasure of seeing Andrea [name] in follow-up at our Neurology Clinic. Pt was accompanied by no one.   History Summary 48 yo caucasian female with PMH of HTN, HLD, DM, PVD, former smoker presented to [facility] on 06/22/14 for left sided weakness and numbness. Symptoms rapid improving, no tPA given but enrolled into PRISMS trial. Her symptoms resolved quickly and MRI showed no acute stroke but again showed right ICA occlusion.   Of note, she had right CEA in 2003 but found to be occluded in 2009. At the same time in 2009, she was found to have left ICA high grade stenosis, so left ICA was done in 2009. Follow up with vascular surgery Dr. Scot Dock and found to have again left ICA stenosis in 03/2014. While in the current admission, carotid doppler found that her left ICA was 60-79% stenosis.   She was continued on ASA and plavix. LDL 100 so her zocor was increased to 20mg . Her A1C was 7.5, indicating DM not in good control. She was discharged in good condition.   Interval History During the interval time, the patient has been doing well. No recurrent symptoms and currently no neurological deficit. She stated that her glucose today was 150, better than before. She was recently put on lisinopril for better BP control. She did not check her BP at home. Today in clinic, it was 121/70.  She quit smoking 3 years ago and alcohol once a week. She saw her PCP Dr. Marin Comment on 07/10/14 and will see him again early next month. She will have repeat carotid in Oct but will see Dr. Scot Dock in April next year.   REVIEW OF SYSTEMS: Full 14 system review of systems performed and notable only for those listed below and in HPI above, all others are negative:  Constitutional: N/A  Cardiovascular: N/A  Ear/Nose/Throat: N/A  Skin: N/A  Eyes: N/A  Respiratory: N/A    Gastroitestinal: N/A  Genitourinary: N/A Hematology/Lymphatic: N/A  Endocrine: N/A  Musculoskeletal: N/A  Allergy/Immunology: N/A  Neurological: headache  Psychiatric: N/A  The following represents the patient's updated allergies and side effects list: Allergies  Allergen Reactions  . Other Hives    Contrast dye     Labs since last visit of relevance include the following: Results for orders placed in visit on 07/07/14  COMPLETE METABOLIC PANEL WITH GFR      Result Value Ref Range   Sodium 139  135 - 145 mEq/L   Potassium 5.0  3.5 - 5.3 mEq/L   Chloride 101  96 - 112 mEq/L   CO2 24  19 - 32 mEq/L   Glucose, Bld 108 (*) 70 - 99 mg/dL   BUN 11  6 - 23 mg/dL   Creat 0.61  0.50 - 1.10 mg/dL   Total Bilirubin 0.5  0.2 - 1.2 mg/dL   Alkaline Phosphatase 72  39 - 117 U/L   AST 48 (*) 0 - 37 U/L   ALT 37 (*) 0 - 35 U/L   Total Protein 7.8  6.0 - 8.3 g/dL   Albumin 4.8  3.5 - 5.2 g/dL   Calcium 10.4  8.4 - 10.5 mg/dL   GFR, Est African American >89     GFR, Est Non African American >89      The neurologically relevant items on the patient's  problem list were reviewed on today's visit.  Neurologic Examination  A problem focused neurological exam (12 or more points of the single system neurologic examination, vital signs counts as 1 point, cranial nerves count for 8 points) was performed.  Blood pressure 121/71, pulse 73, height 5\' 3"  (1.6 m), weight 158 lb 9.6 oz (71.94 kg).  General - Well nourished, well developed, in no apparent distress.  Ophthalmologic - Sharp disc margins OU.  Cardiovascular - Regular rate and rhythm with no murmur.  Mental Status -  Level of arousal and orientation to time, place, and person were intact. Language including expression, naming, repetition, comprehension, reading, and writing was assessed and found intact. Fund of Knowledge was assessed and was intact.  Cranial Nerves II - XII - II - Visual field intact OU. III, IV, VI -  Extraocular movements intact. V - Facial sensation intact bilaterally. VII - Facial movement intact bilaterally. VIII - Hearing & vestibular intact bilaterally. X - Palate elevates symmetrically. XI - Chin turning & shoulder shrug intact bilaterally. XII - Tongue protrusion intact.  Motor Strength - The patient's strength was normal in all extremities and pronator drift was absent.  Bulk was normal and fasciculations were absent.   Motor Tone - Muscle tone was assessed at the neck and appendages and was normal.  Reflexes - The patient's reflexes were normal in all extremities and she had no pathological reflexes.  Sensory - Light touch, temperature/pinprick and Romberg testing were assessed and were normal.    Coordination - The patient had normal movements in the hands and feet with no ataxia or dysmetria.  Tremor was absent.  Gait and Station - The patient's transfers, posture, gait, station, and turns were observed as normal.  Data reviewed: I personally reviewed the images and agree with the radiology interpretations.  Ct Head (brain) Wo Contrast  06/22/2014  No acute intracranial process; if clinical suspicion persists for acute ischemia, MRI of the brain with diffusion-weighted sequences would be more sensitive.  MRI / MRA Brain Wo Contrast  06/23/2014  1. The right internal carotid artery is occluded. It is reconstituted at the level of the posterior communicating artery.  2. No evidence for acute or remote infarct.  3. The left internal carotid artery and posterior circulation are within normal limits.  Dg Chest Port 1 View  06/22/2014  No active disease.  CT of the brain no acute abnormality  Carotid Doppler  - Findings consistent with occlusion involving the right common carotid artery and the right internal carotid artery. - Findings consistent with 60 - 79% stenosis involving the left internal carotid artery. - Bilateral vertebral artery antegrade.  2D Echocardiogram -  ejection fraction 60-65%. No cardiac source of emboli identified  EKG - sinus rhythm rate 85 beats per minute.   LDL 100 and A1C 7.5  Assessment: As you may recall, she is a 48 y.o. Caucasian female with PMH of HTN, HLD, DM, PVD s/p b/l CEA and former smoker presented with TIA with left sided weakness. MRI negative for acute stroke but vessel imaging showed again right ICA/CCA occlusion and left ICA high grade stenosis. She has symptoms at left side arm and leg, but it could still be symptomatic left ICA stenosis due to hypoperfusion of right MCA from [facility] in the setting of right ICA/CCA occlusion. Although pt has patent right PCOM but it may not be adequate. Therefore, it justifies for the intervention on left ICA stenosis. Since she had left CEA before with  right ICA occlusion, I recommend left carotid artery stenting at elective basis. At the meantime, avoid hypotension with SBP goal at 130-150.   Plan: - continue ASA and plavix for dual antiplatelet therapy - continue zocor for stroke prevention - follow up with Dr. Marin Comment for stroke risk factor modification - SBP goal 130-150 to avoid cerebral hypoperfusion - D/C lisinopril 5mg  daily - check BP and glucose at home for HTN and DM control. - pt left sided weakness could still be symptomatic left ICA stenosis due to hypoperfusion of right MCA from [facility] in the setting of right ICA/CCA occlusion. For this reason, I recommend intervention on left ICA with stenting since she had left CEA in the past and also right ICA occluded. Will forward note to Dr. Scot Dock. - RTC in 2 months.  No orders of the defined types were placed in this encounter.    Patient Instructions  - continue ASA and plavix as you did for more than 10 years - continue the zocor for cholesterol and stroke prevention - check BP at home twice a day for two weeks and get the trend  - SBP goal is 130-150 / 80-90 due to right CCA occlusion and left ICA high grade stenosis - discontinue  the lisinopril for now.  - I still think your last episode in July was symptomatic from left ICA stenosis, might be due to low BP and right side brain did not get enough perfusion - recommend check glucose once a day regarding DM control - continue not to smoking - follow up with Dr. Marin Comment next month regarding stroke risk factor modification - recommend to make an appointment with Dr. Scot Dock early to discuss about left ICA stenosis in the setting of right ICA occlusion and a TIA episode which might be symptomatic from left ICA stenosis. - follow up in 2 months.    Rosalin Hawking, MD PhD [facility] [address] [place], [place] [address] [phone]

## 2014-08-29 NOTE — Patient Instructions (Signed)
-   continue ASA and plavix as you did for more than 10 years - continue the zocor for cholesterol and stroke prevention - check BP at home twice a day for two weeks and get the trend  - SBP goal is 130-150 / 80-90 due to right CCA occlusion and left ICA high grade stenosis - discontinue the lisinopril for now.  - I still think your last episode in July was symptomatic from left ICA stenosis, might be due to low BP and right side brain did not get enough perfusion - recommend check glucose once a day regarding DM control - continue not to smoking - follow up with Dr. Marin Comment next month regarding stroke risk factor modification - recommend to make an appointment with Dr. Scot Dock early to discuss about left ICA stenosis in the setting of right ICA occlusion and a TIA episode which might be symptomatic from left ICA stenosis. - follow up in 2 months.

## 2014-09-04 ENCOUNTER — Other Ambulatory Visit: Payer: Self-pay

## 2014-09-04 NOTE — Telephone Encounter (Signed)
Dr Marin Comment, is it [facility] to send in these Rxs for pt?

## 2014-09-04 NOTE — Telephone Encounter (Signed)
Patient was seen by another doctor, they advised that she needs to test her glucose level 2 times per day instead of once a week. She needs a RX for lancet and test strips. She also needs a RX for a blood pressure monitor.

## 2014-09-05 MED ORDER — GLUCOSE BLOOD VI STRP: ORAL_STRIP | Status: DC

## 2014-09-05 MED ORDER — BLOOD PRESSURE CUFF MISC
Status: DC
Start: 2014-09-05 — End: 2014-09-06

## 2014-09-05 MED ORDER — LANCETS MISC: Status: DC

## 2014-09-05 NOTE — Telephone Encounter (Signed)
Sent Rx and advised pt on VM that she may just check BS once daily and alternate between fasting and 2 hrs AC.

## 2014-09-05 NOTE — Telephone Encounter (Signed)
With a A1C of 7.5 I see no reason that the patient should have to check her Blood sugar more than once a day.  You can send in a Rx for test strips for her machine and lacers and a BP monitor of her choice.

## 2014-09-06 ENCOUNTER — Other Ambulatory Visit: Payer: Self-pay | Admitting: *Deleted

## 2014-09-06 MED ORDER — GLUCOSE BLOOD VI STRP: ORAL_STRIP | Status: DC

## 2014-09-06 MED ORDER — BLOOD PRESSURE CUFF MISC: Status: DC

## 2014-09-06 MED ORDER — LANCETS MISC: Status: DC

## 2014-09-06 NOTE — Telephone Encounter (Signed)
Faxed

## 2014-09-06 NOTE — Telephone Encounter (Signed)
Patient called from pharmacy- scripts escribed yesterday were not received for her diabetic testing supplies.  Printed scripts and manually faxed to [facility] [facility] in [place].

## 2014-09-15 ENCOUNTER — Ambulatory Visit: Payer: Medicare PPO | Admitting: Family Medicine

## 2014-09-22 ENCOUNTER — Ambulatory Visit (INDEPENDENT_AMBULATORY_CARE_PROVIDER_SITE_OTHER): Payer: Medicare PPO | Admitting: Family Medicine

## 2014-09-22 VITALS — BP 172/78 | HR 84 | Temp 98.4°F | Resp 16 | Ht 64.0 in | Wt 161.0 lb

## 2014-09-22 DIAGNOSIS — F419 Anxiety disorder, unspecified: Secondary | ICD-10-CM

## 2014-09-22 DIAGNOSIS — F329 Major depressive disorder, single episode, unspecified: Secondary | ICD-10-CM

## 2014-09-22 DIAGNOSIS — E785 Hyperlipidemia, unspecified: Secondary | ICD-10-CM

## 2014-09-22 DIAGNOSIS — F411 Generalized anxiety disorder: Secondary | ICD-10-CM

## 2014-09-22 DIAGNOSIS — I10 Essential (primary) hypertension: Secondary | ICD-10-CM

## 2014-09-22 DIAGNOSIS — I6523 Occlusion and stenosis of bilateral carotid arteries: Secondary | ICD-10-CM

## 2014-09-22 DIAGNOSIS — F418 Other specified anxiety disorders: Secondary | ICD-10-CM

## 2014-09-22 DIAGNOSIS — L989 Disorder of the skin and subcutaneous tissue, unspecified: Secondary | ICD-10-CM

## 2014-09-22 DIAGNOSIS — F32A Depression, unspecified: Secondary | ICD-10-CM

## 2014-09-22 DIAGNOSIS — E0859 Diabetes mellitus due to underlying condition with other circulatory complications: Secondary | ICD-10-CM

## 2014-09-22 LAB — CBC
HCT: 42.6 % (ref 36.0–46.0)
Hemoglobin: 14.4 g/dL (ref 12.0–15.0)
MCH: 29.9 pg (ref 26.0–34.0)
MCHC: 33.8 g/dL (ref 30.0–36.0)
MCV: 88.6 fL (ref 78.0–100.0)
Platelets: 364 10*3/uL (ref 150–400)
RBC: 4.81 MIL/uL (ref 3.87–5.11)
RDW: 14.1 % (ref 11.5–15.5)
WBC: 6.7 10*3/uL (ref 4.0–10.5)

## 2014-09-22 LAB — COMPLETE METABOLIC PANEL WITHOUT GFR
ALT: 42 U/L — ABNORMAL HIGH (ref 0–35)
Albumin: 4.5 g/dL (ref 3.5–5.2)
Glucose, Bld: 100 mg/dL — ABNORMAL HIGH (ref 70–99)
Potassium: 4.8 meq/L (ref 3.5–5.3)
Sodium: 140 meq/L (ref 135–145)
Total Protein: 7.2 g/dL (ref 6.0–8.3)

## 2014-09-22 LAB — COMPLETE METABOLIC PANEL WITH GFR
AST: 27 U/L (ref 0–37)
Alkaline Phosphatase: 77 U/L (ref 39–117)
BUN: 11 mg/dL (ref 6–23)
CO2: 28 mEq/L (ref 19–32)
Calcium: 9.7 mg/dL (ref 8.4–10.5)
Chloride: 103 mEq/L (ref 96–112)
Creat: 0.6 mg/dL (ref 0.50–1.10)
GFR, Est African American: >89 mL/min
GFR, Est Non African American: >89 mL/min
Total Bilirubin: 0.4 mg/dL (ref 0.2–1.2)

## 2014-09-22 LAB — LIPID PANEL
Cholesterol: 146 mg/dL (ref 0–200)
HDL: 45 mg/dL (ref >39)
LDL Cholesterol: 77 mg/dL (ref 0–99)
Total CHOL/HDL Ratio: 3.2 ratio
Triglycerides: 118 mg/dL (ref <150)
VLDL: 24 mg/dL (ref 0–40)

## 2014-09-22 LAB — MICROALBUMIN, URINE: Microalb, Ur: 0.2 mg/dL (ref <2.0)

## 2014-09-22 LAB — GLUCOSE, POCT (MANUAL RESULT ENTRY): POC Glucose: 111 mg/dl — AB (ref 70–99)

## 2014-09-22 LAB — POCT GLYCOSYLATED HEMOGLOBIN (HGB A1C): Hemoglobin A1C: 6

## 2014-09-22 LAB — TSH: TSH: 1.525 u[IU]/mL (ref 0.350–4.500)

## 2014-09-22 MED ORDER — ALPRAZOLAM 0.5 MG PO TABS: 0.5000 mg | ORAL_TABLET | Freq: Three times a day (TID) | ORAL | Status: DC | PRN

## 2014-09-22 NOTE — Progress Notes (Signed)
Chief Complaint:  Chief Complaint  Patient presents with  . Follow-up    3 month, was instructed by Neurologist to stop taking Lisinopril.    HPI: Andrea [name] is a 48 y.o. female who is here for :  Medication refills-needs Xanax HTN told by neuro that she should stop lisinopril because too much, BP too low. She did not take either norvasc or lsiinopril today. She has been off lisinopril  Since her last visit to neurology on 08/29/14.  She has not seen Dr Scot Dock since was rescheduled for carotid stenosis, neurology felt that her TIA sxs may be related to the stenosis so needs f.u  She wants to see another neurologist besides Dr Erlinda Hong, she would like to see Dr Leonie Man since she is enrolled in his clinical trial and would just like to see one neurologist rather 2 neurologist.  She is doing well otherwise, denies any TIS sxs, denies CP or SOB  Mood is stable , no SI/HI/Hallucinations DM-she has been compliant with her meds for DM, no hypoglycemia, no neuropathy, FBG  > 90, < 150 Glucose has been good  BP Readings from Last 3 Encounters:  09/22/14 172/78  08/29/14 121/71  07/07/14 155/81     Past Medical History  Diagnosis Date  . Hypertension   . Anxiety     on Alprazolam  . Depression     on Effexor  . Diabetes mellitus without complication     1884  . Heart murmur     was seen by cardiologist in [facility] 2013  . Allergy   . Atrial fibrillation   . Peripheral vascular disease    Past Surgical History  Procedure Laterality Date  . Cholecystectomy    . Cesarean section    . Tubal ligation    . Femoral bypass      x 2 on right side due to re-stenosis  . Left/right carotid artery      2003 and 2009 for right, right side is 100% occluded per patient; left side 2009  . 2 stents    . Carotid endarterectomy     History   Social History  . Marital Status: Married    Spouse Name: N/A    Number of Children: 1  . Years of Education: College   Occupational  History  . retired/disabled    Social History Main Topics  . Smoking status: Former Smoker    Quit date: 12/22/2010  . Smokeless tobacco: Not on file  . Alcohol Use: Yes     Comment: holidays/special occas 3-4 drinks  . Drug Use: No  . Sexual Activity: Yes   Other Topics Concern  . Not on file   Social History Narrative   Patient lives at home with her husband and son   Patient is right handed   Patient drinks coffee daily   Family History  Problem Relation Age of Onset  . Diabetes Mother   . Heart disease Mother   . Hyperlipidemia Mother   . Hypertension Mother   . Heart disease Father   . Hyperlipidemia Father   . Hypertension Father   . Diabetes Maternal Grandmother   . Heart disease Maternal Grandmother   . Hyperlipidemia Maternal Grandmother   . Cancer Paternal Grandmother   . Cancer Sister   . Varicose Veins Sister   . Heart attack Sister    Allergies  Allergen Reactions  . Other Hives    Contrast dye  Prior to Admission medications   Medication Sig Start Date End Date Taking? Authorizing Provider  ALPRAZolam Duanne Moron) 0.5 MG tablet Take 1 tablet (0.5 mg total) by mouth 3 (three) times daily as needed for anxiety. Can only refill after every 28 days 06/09/14  Yes Jensine Luz P Osaze Hubbert, DO  amLODipine (NORVASC) 10 MG tablet Take 1 tablet (10 mg total) by mouth daily. 06/09/14  Yes Nathon Stefanski P Michalla Ringer, DO  aspirin 325 MG tablet Take 325 mg by mouth daily.   Yes Historical Provider, MD  Blood Pressure Monitoring (BLOOD PRESSURE CUFF) MISC Use to check blood pressure. 09/06/14  Yes Chelle S Jeffery, PA-C  clopidogrel (PLAVIX) 75 MG tablet Take 1 tablet (75 mg total) by mouth daily with breakfast. 06/09/14  Yes Natsuko Kelsay P Raymundo Rout, DO  fenofibrate 54 MG tablet Take 1 tablet (54 mg total) by mouth daily. 06/09/14  Yes Abigaelle Verley P Shawna Wearing, DO  glipiZIDE (GLUCOTROL XL) 10 MG 24 hr tablet Take 1 tablet (10 mg total) by mouth daily with breakfast. 06/09/14  Yes Damante Spragg P Nimo Verastegui, DO  glucose blood test strip Test blood  sugar once daily. 09/06/14  Yes Chelle S Jeffery, PA-C  Lancets MISC Test blood sugar once daily. 09/06/14  Yes Chelle S Jeffery, PA-C  metFORMIN (GLUCOPHAGE) 1000 MG tablet Take 1 tablet (1,000 mg total) by mouth 2 (two) times daily with a meal. 07/07/14  Yes Kyana Aicher P Cortavious Nix, DO  simvastatin (ZOCOR) 20 MG tablet Take 1 tablet (20 mg total) by mouth at bedtime. 06/24/14  Yes David L Rinehuls, PA-C  venlafaxine XR (EFFEXOR-XR) 150 MG 24 hr capsule Take 1 capsule (150 mg total) by mouth daily with breakfast. 06/09/14  Yes Emma Schupp P Allayna Erlich, DO  lisinopril (PRINIVIL,ZESTRIL) 5 MG tablet Take 1 tablet (5 mg total) by mouth daily. 07/07/14   Dashon Mcintire P Elease Swarm, DO     ROS: The patient denies fevers, chills, night sweats, unintentional weight loss, chest pain, palpitations, wheezing, dyspnea on exertion, nausea, vomiting, abdominal pain, dysuria, hematuria, melena, numbness, weakness, or tingling.   All other systems have been reviewed and were otherwise negative with the exception of those mentioned in the HPI and as above.    PHYSICAL EXAM: Filed Vitals:   09/22/14 0939  BP: 172/78  Pulse: 84  Temp: 98.4 F (36.9 C)  Resp: 16   Filed Vitals:   09/22/14 0939  Height: 5\' 4"  (1.626 m)  Weight: 161 lb (73.029 kg)   Body mass index is 27.62 kg/(m^2).  General: Alert, no acute distress HEENT:  Normocephalic, atraumatic, oropharynx patent. EOMI, PERRLA Cardiovascular:  Regular rate and rhythm, no rubs murmurs or gallops.  + carotid bruits, radial pulse intact. No pedal edema.  Respiratory: Clear to auscultation bilaterally.  No wheezes, rales, or rhonchi.  No cyanosis, no use of accessory musculature GI: No organomegaly, abdomen is soft and non-tender, positive bowel sounds.  No masses. Skin: + left temporal lesion, well circumscribed, nonhealing ulcerated center, thick clear borders , about dime size Neurologic: Facial musculature symmetric. Sensation intact Psychiatric: Patient is appropriate throughout our  interaction. Lymphatic: No cervical lymphadenopathy Musculoskeletal: Gait intact.   LABS: Results for orders placed in visit on 09/22/14  COMPLETE METABOLIC PANEL WITH GFR      Result Value Ref Range   Sodium 140  135 - 145 mEq/L   Potassium 4.8  3.5 - 5.3 mEq/L   Chloride 103  96 - 112 mEq/L   CO2 28  19 - 32 mEq/L   Glucose, Bld 100 (*)  70 - 99 mg/dL   BUN 11  6 - 23 mg/dL   Creat 0.60  0.50 - 1.10 mg/dL   Total Bilirubin 0.4  0.2 - 1.2 mg/dL   Alkaline Phosphatase 77  39 - 117 U/L   AST 27  0 - 37 U/L   ALT 42 (*) 0 - 35 U/L   Total Protein 7.2  6.0 - 8.3 g/dL   Albumin 4.5  3.5 - 5.2 g/dL   Calcium 9.7  8.4 - 10.5 mg/dL   GFR, Est African American >89     GFR, Est Non African American >89    TSH      Result Value Ref Range   TSH 1.525  0.350 - 4.500 uIU/mL  LIPID PANEL      Result Value Ref Range   Cholesterol 146  0 - 200 mg/dL   Triglycerides 118  <150 mg/dL   HDL 45  >39 mg/dL   Total CHOL/HDL Ratio 3.2     VLDL 24  0 - 40 mg/dL   LDL Cholesterol 77  0 - 99 mg/dL  CBC      Result Value Ref Range   WBC 6.7  4.0 - 10.5 K/uL   RBC 4.81  3.87 - 5.11 MIL/uL   Hemoglobin 14.4  12.0 - 15.0 g/dL   HCT 42.6  36.0 - 46.0 %   MCV 88.6  78.0 - 100.0 fL   MCH 29.9  26.0 - 34.0 pg   MCHC 33.8  30.0 - 36.0 g/dL   RDW 14.1  11.5 - 15.5 %   Platelets 364  150 - 400 K/uL  GLUCOSE, POCT (MANUAL RESULT ENTRY)      Result Value Ref Range   POC Glucose 111 (*) 70 - 99 mg/dl  POCT GLYCOSYLATED HEMOGLOBIN (HGB A1C)      Result Value Ref Range   Hemoglobin A1C 6.0       EKG/XRAY:   Primary read interpreted by Dr. Marin Comment at [facility].   ASSESSMENT/PLAN: Encounter Diagnoses  Name Primary?  . Essential hypertension Yes  . Hyperlipidemia   . Skin lesion of face   . Anxiety state   . 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 Carotid stenosis, bilateral   . Anxiety and depression    HTN-Advise to go home and take norvasc since she did  not take it today, she can see if her BP is between 130-150s and if not then she needs to let me know because we will need to add a low  f/u in 3 months Hyerlipidemia-cont with current statin, recheck lipids Skin lesion on left temple, this is most likely chronic irritation due to picking at it, it does have a slightly thick border but is not c/w BCC but patient wants it removed, advise not to put any abx on it, do not pick it and will refer to dermatology since on face GAD/Depression-Refilled Xanax for 3 months, she also takes Effexor and has enough of that Labs pending Refilled Xanax x 3 months F/u in 3 months    Gross sideeffects, risk and benefits, and alternatives of medications d/w patient. Patient is aware that all medications have potential sideeffects and we are unable to predict every sideeffect or drug-drug interaction that may occur.  Earnstine Meinders, [place], DO 09/22/2014 3:00 PM

## 2014-09-23 ENCOUNTER — Encounter: Payer: Self-pay | Admitting: Family Medicine

## 2014-09-27 ENCOUNTER — Encounter (INDEPENDENT_AMBULATORY_CARE_PROVIDER_SITE_OTHER): Payer: Self-pay

## 2014-09-27 DIAGNOSIS — Z0289 Encounter for other administrative examinations: Secondary | ICD-10-CM

## 2014-10-11 ENCOUNTER — Other Ambulatory Visit (HOSPITAL_COMMUNITY): Payer: Medicare FFS

## 2014-10-17 ENCOUNTER — Encounter: Payer: Self-pay | Admitting: Vascular Surgery

## 2014-10-18 ENCOUNTER — Ambulatory Visit (HOSPITAL_COMMUNITY)
Admission: RE | Admit: 2014-10-18 | Discharge: 2014-10-18 | Disposition: A | Discharge status: Home / Self Care | Payer: Medicare FFS | Source: Ambulatory Visit | Attending: Vascular Surgery | Admitting: Vascular Surgery

## 2014-10-18 ENCOUNTER — Ambulatory Visit (INDEPENDENT_AMBULATORY_CARE_PROVIDER_SITE_OTHER): Payer: Medicare FFS | Admitting: Vascular Surgery

## 2014-10-18 ENCOUNTER — Encounter: Payer: Self-pay | Admitting: Vascular Surgery

## 2014-10-18 VITALS — BP 133/85 | HR 98 | Resp 16 | Ht 64.0 in | Wt 161.0 lb

## 2014-10-18 DIAGNOSIS — Z48812 Encounter for surgical aftercare following surgery on the circulatory system: Secondary | ICD-10-CM

## 2014-10-18 DIAGNOSIS — I6529 Occlusion and stenosis of unspecified carotid artery: Secondary | ICD-10-CM

## 2014-10-18 DIAGNOSIS — I739 Peripheral vascular disease, unspecified: Secondary | ICD-10-CM

## 2014-10-18 DIAGNOSIS — I6523 Occlusion and stenosis of bilateral carotid arteries: Secondary | ICD-10-CM | POA: Insufficient documentation

## 2014-10-18 DIAGNOSIS — I2510 Atherosclerotic heart disease of native coronary artery without angina pectoris: Secondary | ICD-10-CM | POA: Insufficient documentation

## 2014-10-18 HISTORY — DX: Occlusion and stenosis of bilateral carotid arteries: I65.23

## 2014-10-18 NOTE — Progress Notes (Signed)
HISTORY AND PHYSICAL     CC:  Follow up carotid duplex scan  Referring Provider:  Glenford Bayley, DO  HPI: This is a 48 y.o. female who has known carotid stenosis with hx of right CEA in 2003 and this is now occluded as well as left CEA in 2009.  Pt states that on June 22, 2014, she woke up and did not feel well and went back to bed.  She woke up and continued her normal morning routine.  She did have a little dizziness, which she gets when she does not take her Effexor, but she had taken this.  She continued to feel bad.  She was on her computer and trying to type and her left hand would not work.  When she went to stand up, she fell as her left leg would not work also.  She states that the feeling started to come back in about 15 minutes and was completely back to normal in 8-10 hours.  She did present to the ED and was admitted.  She was placed in a neurology clinical trial and does not know if she received the placebo or true blood thinner.  She presents today for her 6 month check up for her carotid disease.  She does take plavix and aspirin daily.  Her lisinopril has been discontinued for hypotension.  She is on Metformin & Glucotrol for her diabetes.  She is on a statin for her hypercholesterolemia.  She does not smoke anymore.    Past Medical History  Diagnosis Date  . Hypertension   . Anxiety     on Alprazolam  . Depression     on Effexor  . Diabetes mellitus without complication     5784  . Heart murmur     was seen by cardiologist in [facility] 2013  . Allergy   . Atrial fibrillation   . Peripheral vascular disease   . Carotid artery occlusion   . Stroke    Past Surgical History  Procedure Laterality Date  . Cholecystectomy    . Cesarean section    . Tubal ligation    . Femoral bypass      x 2 on right side due to re-stenosis  . Left/right carotid artery      2003 and 2009 for right, right side is 100% occluded per patient; left side 2009  . 2 stents    . Carotid  endarterectomy      Allergies  Allergen Reactions  . Other Hives    Contrast dye     Current Outpatient Prescriptions  Medication Sig Dispense Refill  . ALPRAZolam (XANAX) 0.5 MG tablet Take 1 tablet (0.5 mg total) by mouth 3 (three) times daily as needed for anxiety. Can only refill after every 28 days  90 tablet  3  . amLODipine (NORVASC) 10 MG tablet Take 1 tablet (10 mg total) by mouth daily.  90 tablet  3  . aspirin 325 MG tablet Take 325 mg by mouth daily.      . Blood Pressure Monitoring (BLOOD PRESSURE CUFF) MISC Use to check blood pressure.  1 each  0  . clopidogrel (PLAVIX) 75 MG tablet Take 1 tablet (75 mg total) by mouth daily with breakfast.  90 tablet  3  . fenofibrate 54 MG tablet Take 1 tablet (54 mg total) by mouth daily.  90 tablet  1  . glipiZIDE (GLUCOTROL XL) 10 MG 24 hr tablet Take 1 tablet (10 mg total) by  mouth daily with breakfast.  90 tablet  3  . glucose blood test strip Test blood sugar once daily.  100 each  2  . Lancets MISC Test blood sugar once daily.  100 each  2  . metFORMIN (GLUCOPHAGE) 1000 MG tablet Take 1 tablet (1,000 mg total) by mouth 2 (two) times daily with a meal.  180 tablet  3  . simvastatin (ZOCOR) 20 MG tablet Take 1 tablet (20 mg total) by mouth at bedtime.  30 tablet  3  . venlafaxine XR (EFFEXOR-XR) 150 MG 24 hr capsule Take 1 capsule (150 mg total) by mouth daily with breakfast.  90 capsule  3  . lisinopril (PRINIVIL,ZESTRIL) 5 MG tablet Take 1 tablet (5 mg total) by mouth daily.  90 tablet  3   No current facility-administered medications for this visit.    Pt's meds include: Statin:  Yes.   Beta Blocker:  No. Aspirin:  Yes.   Other antiplatelets/anticoagulants:  Yes.  -Plavix   Family History  Problem Relation Age of Onset  . Diabetes Mother   . Heart disease Mother   . Hyperlipidemia Mother   . Hypertension Mother   . Heart disease Father   . Hyperlipidemia Father   . Hypertension Father   . Diabetes Maternal  Grandmother   . Heart disease Maternal Grandmother   . Hyperlipidemia Maternal Grandmother   . Cancer Paternal Grandmother   . Cancer Sister   . Varicose Veins Sister   . Heart attack Sister     History   Social History  . Marital Status: Married    Spouse Name: N/A    Number of Children: 1  . Years of Education: College   Occupational History  . retired/disabled    Social History Main Topics  . Smoking status: Former Smoker    Quit date: 12/22/2010  . Smokeless tobacco: Never Used  . Alcohol Use: Yes     Comment: holidays/special occas 3-4 drinks  . Drug Use: No  . Sexual Activity: Yes   Other Topics Concern  . Not on file   Social History Narrative   Patient lives at home with her husband and son   Patient is right handed   Patient drinks coffee daily     ROS: [x]  Positive   [ ]  Negative   [ ]  All sytems reviewed and are negative  Cardiovascular: []  chest pain/pressure []  palpitations []  SOB lying flat []  DOE [x]  pain in legs while walking []  pain in feet when lying flat []  hx of DVT []  hx of phlebitis [x]  swelling in legs []  varicose veins  Pulmonary: []  productive cough []  asthma []  wheezing  Neurologic: []  weakness in []  arms []  legs []  numbness in []  arms []  legs [] difficulty speaking or slurred speech []  temporary loss of vision in one eye []  dizziness  Hematologic: []  bleeding problems []  problems with blood clotting easily  GI []  vomiting blood []  blood in stool  GU: []  burning with urination []  blood in urine  Psychiatric: [x]  hx of major depression  Integumentary: []  rashes []  ulcers  Constitutional: []  fever []  chills   PHYSICAL EXAMINATION:  Filed Vitals:   10/18/14 1524  BP: 133/85  Pulse: 98  Resp:    Body mass index is 27.62 kg/(m^2).  General:  WDWN in NAD Gait: Normal HENT: WNL; normocephalic Eyes: PERRL Pulmonary: normal non-labored breathing , without Rales, rhonchi,  wheezing Cardiac: RRR,  +  carotid bruits bilaterally Abdomen: soft, NT,  no masses Skin: without rashes,  ulcers  Vascular Exam/Pulses:   Right Left  Radial 2+ (normal) 2+ (normal)  Ulnar Unable to palpate  Unable to palpate   Femoral Not palpated Not palpated  DP 2+ (normal) 2+ (normal)  PT Unable to palpate  Unable to palpate     Extremities: without ischemic changes, without Gangrene , without cellulitis; without open wounds;  Musculoskeletal: without muscle wasting or atrophy  Neurologic: A&O X 3; Appropriate Affect ; SENSATION: normal; MOTOR FUNCTION:  moving all extremities equally. Speech is fluent/normal   Non-Invasive Vascular Imaging: Carotid Duplex Scan:  10/18/2014 1.  Known occlusion of the right ICA and CCA 2.  Right ECA fed by a retrograde branch 3.  Patent left CEA with restenosis of the surgical site-velocities suggest high end 40-59%   ASSESSMENT/PLAN:: 48 y.o. female here for f/u carotid duplex scan.  She did have a stroke on June 22, 2014, which she has completely recovered from  -her carotid duplex remains stable today and the category of disease has been downgraded in comparison to her last exam in April 2015. -Her lisinopril has been discontinued due to hypotension -continue her plavix and aspirin daily as well as her statin -Dr. Scot Dock discussed nutrition with her -she will f/u in 6 months with carotid duplex scan as well as abi's. -she knows to present to ED if any further events.  Leontine Locket, PA-C [facility] [phone]  Clinic MD:   Pt seen and examined in conjunction with Dr. Scot Dock  Agree with above. The patient had left-sided weakness during the event in July and it was felt to potentially be related to hypotension. She has a known right internal carotid artery occlusion. She has only mild disease on the left which is actually improved compared to her most recent study. See her back in 6 months when she returns for her carotid duplex scan. She  knows to call sooner if she has problems.  In the meantime she remains on aspirin, Plavix, and a statin.  Deitra Mayo, MD, [place] [phone] 10/18/2014

## 2014-10-25 ENCOUNTER — Other Ambulatory Visit: Payer: Self-pay | Admitting: Dermatology

## 2014-11-08 ENCOUNTER — Ambulatory Visit: Payer: Medicare PPO | Admitting: Neurology

## 2014-12-04 ENCOUNTER — Other Ambulatory Visit: Payer: Self-pay | Admitting: Family Medicine

## 2014-12-29 ENCOUNTER — Ambulatory Visit (INDEPENDENT_AMBULATORY_CARE_PROVIDER_SITE_OTHER): Payer: Medicare PPO | Admitting: Family Medicine

## 2014-12-29 ENCOUNTER — Encounter: Payer: Self-pay | Admitting: Family Medicine

## 2014-12-29 VITALS — BP 148/72 | HR 88 | Temp 98.3°F | Resp 16 | Ht 64.5 in | Wt 161.0 lb

## 2014-12-29 DIAGNOSIS — E1159 Type 2 diabetes mellitus with other circulatory complications: Secondary | ICD-10-CM

## 2014-12-29 DIAGNOSIS — I10 Essential (primary) hypertension: Secondary | ICD-10-CM

## 2014-12-29 DIAGNOSIS — F419 Anxiety disorder, unspecified: Secondary | ICD-10-CM

## 2014-12-29 DIAGNOSIS — F32A Depression, unspecified: Secondary | ICD-10-CM

## 2014-12-29 DIAGNOSIS — F329 Major depressive disorder, single episode, unspecified: Secondary | ICD-10-CM

## 2014-12-29 DIAGNOSIS — E785 Hyperlipidemia, unspecified: Secondary | ICD-10-CM

## 2014-12-29 DIAGNOSIS — F418 Other specified anxiety disorders: Secondary | ICD-10-CM

## 2014-12-29 DIAGNOSIS — Z23 Encounter for immunization: Secondary | ICD-10-CM

## 2014-12-29 DIAGNOSIS — I639 Cerebral infarction, unspecified: Secondary | ICD-10-CM

## 2014-12-29 LAB — COMPLETE METABOLIC PANEL WITHOUT GFR
ALT: 22 U/L (ref 0–35)
Calcium: 10.1 mg/dL (ref 8.4–10.5)
Creat: 0.5 mg/dL (ref 0.50–1.10)
GFR, Est African American: >89 mL/min
Total Bilirubin: 0.3 mg/dL (ref 0.2–1.2)
Total Protein: 7.5 g/dL (ref 6.0–8.3)

## 2014-12-29 LAB — COMPLETE METABOLIC PANEL WITH GFR
AST: 18 U/L (ref 0–37)
Albumin: 4.5 g/dL (ref 3.5–5.2)
Alkaline Phosphatase: 80 U/L (ref 39–117)
BUN: 12 mg/dL (ref 6–23)
CO2: 25 mEq/L (ref 19–32)
Chloride: 105 mEq/L (ref 96–112)
GFR, Est Non African American: >89 mL/min
Glucose, Bld: 96 mg/dL (ref 70–99)
Potassium: 4.1 mEq/L (ref 3.5–5.3)
Sodium: 140 mEq/L (ref 135–145)

## 2014-12-29 LAB — LIPID PANEL
Cholesterol: 163 mg/dL (ref 0–200)
HDL: 36 mg/dL — ABNORMAL LOW (ref >39)
LDL Cholesterol: 95 mg/dL (ref 0–99)
Total CHOL/HDL Ratio: 4.5 ratio
Triglycerides: 161 mg/dL — ABNORMAL HIGH (ref <150)
VLDL: 32 mg/dL (ref 0–40)

## 2014-12-29 MED ORDER — ALPRAZOLAM 0.5 MG PO TABS: 0.5000 mg | ORAL_TABLET | Freq: Three times a day (TID) | ORAL | Status: DC | PRN

## 2014-12-29 MED ORDER — GLIPIZIDE ER 10 MG PO TB24: 10.0000 mg | ORAL_TABLET | Freq: Every day | ORAL | Status: DC

## 2014-12-29 MED ORDER — VENLAFAXINE HCL ER 150 MG PO CP24: 150.0000 mg | ORAL_CAPSULE | Freq: Every day | ORAL | Status: DC

## 2014-12-29 MED ORDER — FENOFIBRATE 54 MG PO TABS: 54.0000 mg | ORAL_TABLET | Freq: Every day | ORAL | Status: DC

## 2014-12-29 MED ORDER — AMLODIPINE BESYLATE 10 MG PO TABS: 10.0000 mg | ORAL_TABLET | Freq: Every day | ORAL | Status: DC

## 2014-12-29 NOTE — Progress Notes (Signed)
Chief Complaint:  Chief Complaint  Patient presents with  . Blood work  . Hypertension    HPI: Andrea [name] is a 49 y.o. female who is here for med refills Sugars have been 100-140, NO hypoglycemia, no CP or dizziness BP has been running at home 130-150/75 Mood and sleep has been ok She was switched from simivistatin 20 to 10 mg daily, still has it, the msk aches are much better, No SEs since on simvastatin 10 mg   BP Readings from Last 3 Encounters:  12/29/14 148/72  10/18/14 133/85  09/22/14 172/78   Lab Results  Component Value Date   HGBA1C 6.0 09/22/2014   HGBA1C 7.5* 06/23/2014   HGBA1C 7.3 06/09/2014   Lab Results  Component Value Date   MICROALBUR 0.2 09/22/2014   LDLCALC 77 09/22/2014   CREATININE 0.60 09/22/2014    Wt Readings from Last 3 Encounters:  12/29/14 161 lb (73.029 kg)  10/18/14 161 lb (73.029 kg)  09/22/14 161 lb (73.029 kg)       Past Medical History  Diagnosis Date  . Hypertension   . Anxiety     on Alprazolam  . Depression     on Effexor  . Diabetes mellitus without complication     4540  . Heart murmur     was seen by cardiologist in [facility] 2013  . Allergy   . Atrial fibrillation   . Peripheral vascular disease   . Carotid artery occlusion   . Stroke    Past Surgical History  Procedure Laterality Date  . Cholecystectomy    . Cesarean section    . Tubal ligation    . Femoral bypass      x 2 on right side due to re-stenosis  . Left/right carotid artery      2003 and 2009 for right, right side is 100% occluded per patient; left side 2009  . 2 stents    . Carotid endarterectomy     History   Social History  . Marital Status: Married    Spouse Name: N/A    Number of Children: 1  . Years of Education: College   Occupational History  . retired/disabled    Social History Main Topics  . Smoking status: Former Smoker    Quit date: 12/22/2010  . Smokeless tobacco: Never Used  . Alcohol Use: Yes    Comment: holidays/special occas 3-4 drinks  . Drug Use: No  . Sexual Activity: Yes   Other Topics Concern  . None   Social History Narrative   Patient lives at home with her husband and son   Patient is right handed   Patient drinks coffee daily   Family History  Problem Relation Age of Onset  . Diabetes Mother   . Heart disease Mother   . Hyperlipidemia Mother   . Hypertension Mother   . Heart disease Father   . Hyperlipidemia Father   . Hypertension Father   . Diabetes Maternal Grandmother   . Heart disease Maternal Grandmother   . Hyperlipidemia Maternal Grandmother   . Cancer Paternal Grandmother   . Cancer Sister   . Varicose Veins Sister   . Heart attack Sister    Allergies  Allergen Reactions  . Other Hives    Contrast dye    Prior to Admission medications   Medication Sig Start Date End Date Taking? Authorizing Provider  ALPRAZolam Duanne Moron) 0.5 MG tablet Take 1 tablet (0.5 mg total) by  mouth 3 (three) times daily as needed for anxiety. Can only refill after every 28 days 09/22/14  Yes Micholas Drumwright P Sherlin Sonier, DO  amLODipine (NORVASC) 10 MG tablet Take 1 tablet (10 mg total) by mouth daily. 06/09/14  Yes Avonlea Sima P Marylu Dudenhoeffer, DO  aspirin 325 MG tablet Take 325 mg by mouth daily.   Yes Historical Provider, MD  Blood Pressure Monitoring (BLOOD PRESSURE CUFF) MISC Use to check blood pressure. 09/06/14  Yes Chelle S Jeffery, PA-C  clopidogrel (PLAVIX) 75 MG tablet Take 1 tablet (75 mg total) by mouth daily with breakfast. 06/09/14  Yes Gurtaj Ruz P Ezmae Speers, DO  fenofibrate 54 MG tablet Take 1 tablet (54 mg total) by mouth daily. 06/09/14  Yes Jewel Venditto P Jaydi Bray, DO  glipiZIDE (GLUCOTROL XL) 10 MG 24 hr tablet Take 1 tablet (10 mg total) by mouth daily with breakfast. 06/09/14  Yes Christpoher Sievers P Careen Mauch, DO  glucose blood test strip Test blood sugar once daily. 09/06/14  Yes Chelle S Jeffery, PA-C  Lancets MISC Test blood sugar once daily. 09/06/14  Yes Chelle S Jeffery, PA-C  metFORMIN (GLUCOPHAGE) 1000 MG tablet Take 1 tablet  (1,000 mg total) by mouth 2 (two) times daily with a meal. 07/07/14  Yes Layani Foronda P Branton Einstein, DO  venlafaxine XR (EFFEXOR-XR) 150 MG 24 hr capsule Take 1 capsule (150 mg total) by mouth daily with breakfast. 06/09/14  Yes Rogue Pautler P Kienan Doublin, DO  lisinopril (PRINIVIL,ZESTRIL) 5 MG tablet Take 1 tablet (5 mg total) by mouth daily. Patient not taking: Reported on 12/29/2014 07/07/14   Kendyll Huettner P Nusaybah Ivie, DO  simvastatin (ZOCOR) 20 MG tablet Take 1 tablet (20 mg total) by mouth at bedtime. Patient not taking: Reported on 12/29/2014 06/24/14   Shanon Brow L Rinehuls, PA-C     ROS: The patient denies fevers, chills, night sweats, unintentional weight loss, chest pain, palpitations, wheezing, dyspnea on exertion, nausea, vomiting, abdominal pain, dysuria, hematuria, melena, numbness, weakness, or tingling.   All other systems have been reviewed and were otherwise negative with the exception of those mentioned in the HPI and as above.    PHYSICAL EXAM: Filed Vitals:   12/29/14 0948  BP: 148/72  Pulse: 88  Temp: 98.3 F (36.8 C)  Resp: 16   Filed Vitals:   12/29/14 0948  Height: 5' 4.5" (1.638 m)  Weight: 161 lb (73.029 kg)   Body mass index is 27.22 kg/(m^2).  General: Alert, no acute distress HEENT:  Normocephalic, atraumatic, oropharynx patent. EOMI, PERRLA Fundoscopic exam normal Cardiovascular:  Regular rate and rhythm, no rubs murmurs or gallops.  Radial pulse intact. No pedal edema.  Respiratory: Clear to auscultation bilaterally.  No wheezes, rales, or rhonchi.  No cyanosis, no use of accessory musculature GI: No organomegaly, abdomen is soft and non-tender, positive bowel sounds.  No masses. Skin: + BCC scar on left temporal facial area Neurologic: Facial musculature symmetric. Psychiatric: Patient is appropriate throughout our interaction. Lymphatic: No cervical lymphadenopathy Musculoskeletal: Gait intact. Microfilament exam is normal   LABS: Results for orders placed or performed in visit on 09/22/14  COMPLETE  METABOLIC PANEL WITH GFR  Result Value Ref Range   Sodium 140 135 - 145 mEq/L   Potassium 4.8 3.5 - 5.3 mEq/L   Chloride 103 96 - 112 mEq/L   CO2 28 19 - 32 mEq/L   Glucose, Bld 100 (H) 70 - 99 mg/dL   BUN 11 6 - 23 mg/dL   Creat 0.60 0.50 - 1.10 mg/dL   Total Bilirubin 0.4 0.2 -  1.2 mg/dL   Alkaline Phosphatase 77 39 - 117 U/L   AST 27 0 - 37 U/L   ALT 42 (H) 0 - 35 U/L   Total Protein 7.2 6.0 - 8.3 g/dL   Albumin 4.5 3.5 - 5.2 g/dL   Calcium 9.7 8.4 - 10.5 mg/dL   GFR, Est African American >89 mL/min   GFR, Est Non African American >89 mL/min  TSH  Result Value Ref Range   TSH 1.525 0.350 - 4.500 uIU/mL  Lipid panel  Result Value Ref Range   Cholesterol 146 0 - 200 mg/dL   Triglycerides 118 <150 mg/dL   HDL 45 >39 mg/dL   Total CHOL/HDL Ratio 3.2 Ratio   VLDL 24 0 - 40 mg/dL   LDL Cholesterol 77 0 - 99 mg/dL  Microalbumin, urine  Result Value Ref Range   Microalb, Ur 0.2 <2.0 mg/dL  CBC  Result Value Ref Range   WBC 6.7 4.0 - 10.5 K/uL   RBC 4.81 3.87 - 5.11 MIL/uL   Hemoglobin 14.4 12.0 - 15.0 g/dL   HCT 42.6 36.0 - 46.0 %   MCV 88.6 78.0 - 100.0 fL   MCH 29.9 26.0 - 34.0 pg   MCHC 33.8 30.0 - 36.0 g/dL   RDW 14.1 11.5 - 15.5 %   Platelets 364 150 - 400 K/uL  POCT glucose (manual entry)  Result Value Ref Range   POC Glucose 111 (A) 70 - 99 mg/dl  POCT glycosylated hemoglobin (Hb A1C)  Result Value Ref Range   Hemoglobin A1C 6.0      EKG/XRAY:   Primary read interpreted by Dr. Marin Comment at [facility].   ASSESSMENT/PLAN: Encounter Diagnoses  Name Primary?  . Need for prophylactic vaccination and inoculation against influenza   . Anxiety and depression   . Essential hypertension   . Hyperlipidemia   . Stroke   . Type 2 diabetes mellitus with other circulatory complications Yes   Andrea [name] is a pleasant caucasian female with a PMH of TIA/CVA without residual weakness, GAD/Depression, PVD s/p CEA, HTN, T2DM, Hyperlipidemai who is here for medication refills.    She is compliant with ehr meds, no SEs.  Refilled meds for 3 months She has simvastatin so will cont with 10 mg daily F/u in 3 months for annual checkup with med refills  Gross sideeffects, risk and benefits, and alternatives of medications d/w patient. Patient is aware that all medications have potential sideeffects and we are unable to predict every sideeffect or drug-drug interaction that may occur.  Andrea [name], [place], DO 12/29/2014 10:23 AM

## 2015-03-30 ENCOUNTER — Encounter: Payer: Medicare PPO | Admitting: Family Medicine

## 2015-04-18 ENCOUNTER — Ambulatory Visit: Payer: Medicare FFS | Admitting: Vascular Surgery

## 2015-04-18 ENCOUNTER — Inpatient Hospital Stay (INDEPENDENT_AMBULATORY_CARE_PROVIDER_SITE_OTHER)
Admission: RE | Admit: 2015-04-18 | Discharge: 2015-04-18 | Disposition: A | Discharge status: Home / Self Care | Payer: Medicare FFS | Source: Ambulatory Visit

## 2015-04-18 ENCOUNTER — Other Ambulatory Visit (HOSPITAL_COMMUNITY): Payer: Medicare FFS

## 2015-04-18 ENCOUNTER — Encounter (HOSPITAL_COMMUNITY): Payer: Medicare FFS

## 2015-04-18 DIAGNOSIS — I6529 Occlusion and stenosis of unspecified carotid artery: Secondary | ICD-10-CM

## 2015-04-18 DIAGNOSIS — I739 Peripheral vascular disease, unspecified: Secondary | ICD-10-CM

## 2015-04-18 DIAGNOSIS — Z48812 Encounter for surgical aftercare following surgery on the circulatory system: Secondary | ICD-10-CM

## 2015-05-02 ENCOUNTER — Other Ambulatory Visit: Payer: Self-pay | Admitting: *Deleted

## 2015-05-02 DIAGNOSIS — I6523 Occlusion and stenosis of bilateral carotid arteries: Secondary | ICD-10-CM

## 2015-05-07 ENCOUNTER — Encounter: Payer: Self-pay | Admitting: Vascular Surgery

## 2015-05-09 ENCOUNTER — Ambulatory Visit (INDEPENDENT_AMBULATORY_CARE_PROVIDER_SITE_OTHER)
Admission: RE | Admit: 2015-05-09 | Discharge: 2015-05-09 | Disposition: A | Discharge status: Home / Self Care | Payer: Medicare PPO | Source: Ambulatory Visit | Attending: Vascular Surgery | Admitting: Vascular Surgery

## 2015-05-09 ENCOUNTER — Ambulatory Visit (INDEPENDENT_AMBULATORY_CARE_PROVIDER_SITE_OTHER): Payer: Medicare PPO | Admitting: Vascular Surgery

## 2015-05-09 ENCOUNTER — Encounter: Payer: Self-pay | Admitting: Vascular Surgery

## 2015-05-09 ENCOUNTER — Ambulatory Visit (HOSPITAL_COMMUNITY)
Admission: RE | Admit: 2015-05-09 | Discharge: 2015-05-09 | Disposition: A | Discharge status: Home / Self Care | Payer: Medicare PPO | Source: Ambulatory Visit | Attending: Vascular Surgery | Admitting: Vascular Surgery

## 2015-05-09 VITALS — BP 131/70 | HR 85 | Resp 16 | Ht 64.0 in | Wt 156.0 lb

## 2015-05-09 DIAGNOSIS — I739 Peripheral vascular disease, unspecified: Secondary | ICD-10-CM

## 2015-05-09 DIAGNOSIS — I6523 Occlusion and stenosis of bilateral carotid arteries: Secondary | ICD-10-CM

## 2015-05-09 DIAGNOSIS — I70213 Atherosclerosis of native arteries of extremities with intermittent claudication, bilateral legs: Secondary | ICD-10-CM | POA: Insufficient documentation

## 2015-05-09 DIAGNOSIS — Z48812 Encounter for surgical aftercare following surgery on the circulatory system: Secondary | ICD-10-CM

## 2015-05-09 DIAGNOSIS — I70203 Unspecified atherosclerosis of native arteries of extremities, bilateral legs: Secondary | ICD-10-CM

## 2015-05-09 NOTE — Addendum Note (Signed)
Addended by: Mena Goes on: 05/09/2015 03:25 PM   Modules accepted: Orders

## 2015-05-09 NOTE — Progress Notes (Signed)
[facility] [place]  Patient name: Andrea [name] MRN: [id number] DOB: 1966-01-15 Sex: female  REASON FOR VISIT: Follow up  HPI: Andrea [name] is a 49 y.o. female Who has undergone previous bilateral carotid endarterectomies by Dr. Drucie Opitz and also a femorofemoral bypass graft by Dr. Amedeo Plenty. I last saw on 10/18/2014. She had described some left-sided weakness in July which was felt to be related to hypotension. She has a known right internal carotid artery occlusion. She had mild disease on the left which is actually improved at the time of her last follow up study. For a 6 month follow up visit.  Since I saw her last, she denies any history of stroke, TIAs, expressive or receptive aphasia, or ecchymosis fugax. She denies significant claudication or rest pain.  She is on aspirin, Plavix, and a statin. (She is on simvastatin).  Past Medical History  Diagnosis Date  . Hypertension   . Anxiety     on Alprazolam  . Depression     on Effexor  . Diabetes mellitus without complication     1443  . Heart murmur     was seen by cardiologist in [facility] 2013  . Allergy   . Atrial fibrillation   . Peripheral vascular disease   . Carotid artery occlusion   . Stroke    Family History  Problem Relation Age of Onset  . Diabetes Mother   . Heart disease Mother   . Hyperlipidemia Mother   . Hypertension Mother   . Heart disease Father   . Hyperlipidemia Father   . Hypertension Father   . Diabetes Maternal Grandmother   . Heart disease Maternal Grandmother   . Hyperlipidemia Maternal Grandmother   . Cancer Paternal Grandmother   . Cancer Sister   . Varicose Veins Sister   . Heart attack Sister    SOCIAL HISTORY: History  Substance Use Topics  . Smoking status: Former Smoker    Quit date: 12/22/2010  . Smokeless tobacco: Never Used  . Alcohol Use: 0.0 oz/week    0 Standard drinks or equivalent per week     Comment: holidays/special occas 3-4  drinks   Allergies  Allergen Reactions  . Other Hives    Contrast dye    Current Outpatient Prescriptions  Medication Sig Dispense Refill  . ALPRAZolam (XANAX) 0.5 MG tablet Take 1 tablet (0.5 mg total) by mouth 3 (three) times daily as needed for anxiety. Can only refill after every 28 days 90 tablet 3  . amLODipine (NORVASC) 10 MG tablet Take 1 tablet (10 mg total) by mouth daily. 90 tablet 3  . aspirin 325 MG tablet Take 325 mg by mouth daily.    . Blood Pressure Monitoring (BLOOD PRESSURE CUFF) MISC Use to check blood pressure. 1 each 0  . clopidogrel (PLAVIX) 75 MG tablet Take 1 tablet (75 mg total) by mouth daily with breakfast. 90 tablet 3  . fenofibrate 54 MG tablet Take 1 tablet (54 mg total) by mouth daily. 90 tablet 3  . glipiZIDE (GLUCOTROL XL) 10 MG 24 hr tablet Take 1 tablet (10 mg total) by mouth daily with breakfast. 90 tablet 3  . glucose blood test strip Test blood sugar once daily. 100 each 2  . Lancets MISC Test blood sugar once daily. 100 each 2  . metFORMIN (GLUCOPHAGE) 1000 MG tablet Take 1 tablet (1,000 mg total) by mouth 2 (two) times daily with a meal. 180 tablet 3  .  venlafaxine XR (EFFEXOR-XR) 150 MG 24 hr capsule Take 1 capsule (150 mg total) by mouth daily with breakfast. 90 capsule 3   No current facility-administered medications for this visit.   REVIEW OF SYSTEMS: Valu.Nieves ] denotes positive finding; [  ] denotes negative finding  CARDIOVASCULAR:  [ ]  chest pain   [ ]  chest pressure   [ ]  palpitations   [ ]  orthopnea   [ ]  dyspnea on exertion   [ ]  claudication   [ ]  rest pain   [ ]  DVT   [ ]  phlebitis PULMONARY:   [ ]  productive cough   [ ]  asthma   [ ]  wheezing NEUROLOGIC:   [ ]  weakness  [ ]  paresthesias  [ ]  aphasia  [ ]  amaurosis  [ ]  dizziness HEMATOLOGIC:   [ ]  bleeding problems   [ ]  clotting disorders MUSCULOSKELETAL:  [ ]  joint pain   [ ]  joint swelling [ ]  leg swelling GASTROINTESTINAL: [ ]   blood in stool  [ ]   hematemesis GENITOURINARY:  [ ]    dysuria  [ ]   hematuria PSYCHIATRIC:  [ ]  history of major depression INTEGUMENTARY:  [ ]  rashes  [ ]  ulcers CONSTITUTIONAL:  [ ]  fever   [ ]  chills  PHYSICAL EXAM: Filed Vitals:   05/09/15 1404 05/09/15 1405  BP: 137/70 131/70  Pulse: 88 85  Resp: 16   Height: 5\' 4"  (1.626 m)   Weight: 156 lb (70.761 kg)    GENERAL: The patient is a well-nourished female, in no acute distress. The vital signs are documented above. CARDIOVASCULAR: There is a regular rate and rhythm. She has bilateral carotid bruits. She has palpable femoral pulses and a palpable femorofemoral graft pulse. Both feet are warm and well-perfused. PULMONARY: There is good air exchange bilaterally without wheezing or rales. ABDOMEN: Soft and non-tender with normal pitched bowel sounds.  MUSCULOSKELETAL: There are no major deformities or cyanosis. NEUROLOGIC: No focal weakness or paresthesias are detected. SKIN: There are no ulcers or rashes noted. PSYCHIATRIC: The patient has a normal affect.  DATA:  I have independently interpreted her carotid duplex scan. She has a known right carotid occlusion. She has a 50-69% recurrent left carotid stenosis. Both vertebral arteries are patent with antegrade flow. There has been no significant change in the stenosis on the left.  I have independently interpreted her arterial Doppler study which shows biphasic Doppler signals in both feet with ABIs of 100% bilaterally.  MEDICAL ISSUES:  BILATERAL CAROTID DISEASE:   The patient has a stable moderate recurrent left carotid stenosis. She had a left carotid endarterectomy by Dr. Amedeo Plenty in 2009. She has a known right carotid occlusion. Given the right carotid occlusion I have recommended continued close follow up of the left side and I have ordered a duplex scan in 6 months. We will have her follow up with the nurse practitioner at that time. She is on aspirin, Plavix, and a statin.  STATUS POST FEMOROFEMORAL BYPASS GRAFT: Her bypass graft is  patent and she has normal ABIs. I have ordered follow up ABIs in 1 year.  Return in about 6 months (around 11/09/2015).   Deitra Mayo [facility] [place]: [phone]

## 2015-05-11 ENCOUNTER — Ambulatory Visit (INDEPENDENT_AMBULATORY_CARE_PROVIDER_SITE_OTHER): Payer: Medicare PPO | Admitting: Family Medicine

## 2015-05-11 ENCOUNTER — Encounter: Payer: Self-pay | Admitting: Family Medicine

## 2015-05-11 VITALS — BP 125/70 | HR 74 | Temp 98.2°F | Resp 16 | Ht 64.0 in | Wt 154.8 lb

## 2015-05-11 DIAGNOSIS — Z0000 Encounter for general adult medical examination without abnormal findings: Secondary | ICD-10-CM

## 2015-05-11 DIAGNOSIS — E1159 Type 2 diabetes mellitus with other circulatory complications: Secondary | ICD-10-CM | POA: Diagnosis not present

## 2015-05-11 DIAGNOSIS — I639 Cerebral infarction, unspecified: Secondary | ICD-10-CM | POA: Diagnosis not present

## 2015-05-11 DIAGNOSIS — Z1211 Encounter for screening for malignant neoplasm of colon: Secondary | ICD-10-CM

## 2015-05-11 DIAGNOSIS — I6523 Occlusion and stenosis of bilateral carotid arteries: Secondary | ICD-10-CM

## 2015-05-11 DIAGNOSIS — F32A Depression, unspecified: Secondary | ICD-10-CM

## 2015-05-11 DIAGNOSIS — I10 Essential (primary) hypertension: Secondary | ICD-10-CM | POA: Diagnosis not present

## 2015-05-11 DIAGNOSIS — E785 Hyperlipidemia, unspecified: Secondary | ICD-10-CM

## 2015-05-11 DIAGNOSIS — Z1239 Encounter for other screening for malignant neoplasm of breast: Secondary | ICD-10-CM | POA: Diagnosis not present

## 2015-05-11 DIAGNOSIS — F418 Other specified anxiety disorders: Secondary | ICD-10-CM | POA: Diagnosis not present

## 2015-05-11 DIAGNOSIS — F329 Major depressive disorder, single episode, unspecified: Secondary | ICD-10-CM

## 2015-05-11 DIAGNOSIS — F419 Anxiety disorder, unspecified: Secondary | ICD-10-CM

## 2015-05-11 LAB — CBC WITH DIFFERENTIAL/PLATELET
Basophils Absolute: 0.1 10*3/uL (ref 0.0–0.1)
Basophils Relative: 1 % (ref 0–1)
Eosinophils Absolute: 0.1 10*3/uL (ref 0.0–0.7)
Eosinophils Relative: 1 % (ref 0–5)
HCT: 47 % — ABNORMAL HIGH (ref 36.0–46.0)
Hemoglobin: 16 g/dL — ABNORMAL HIGH (ref 12.0–15.0)
Lymphocytes Relative: 32 % (ref 12–46)
Lymphs Abs: 3 10*3/uL (ref 0.7–4.0)
MCH: 30.1 pg (ref 26.0–34.0)
MCHC: 34 g/dL (ref 30.0–36.0)
MCV: 88.5 fL (ref 78.0–100.0)
MPV: 10.9 fL (ref 8.6–12.4)
Monocytes Absolute: 0.6 10*3/uL (ref 0.1–1.0)
Monocytes Relative: 6 % (ref 3–12)
Neutro Abs: 5.6 10*3/uL (ref 1.7–7.7)
Neutrophils Relative %: 60 % (ref 43–77)
Platelets: 353 10*3/uL (ref 150–400)
RBC: 5.31 MIL/uL — ABNORMAL HIGH (ref 3.87–5.11)
RDW: 14.3 % (ref 11.5–15.5)
WBC: 9.3 10*3/uL (ref 4.0–10.5)

## 2015-05-11 LAB — COMPLETE METABOLIC PANEL WITH GFR
ALT: 20 U/L (ref 0–35)
AST: 19 U/L (ref 0–37)
Albumin: 4.5 g/dL (ref 3.5–5.2)
Alkaline Phosphatase: 102 U/L (ref 39–117)
BUN: 10 mg/dL (ref 6–23)
CO2: 29 mEq/L (ref 19–32)
Calcium: 9.7 mg/dL (ref 8.4–10.5)
GFR, Est African American: >89 mL/min
GFR, Est Non African American: >89 mL/min
Glucose, Bld: 103 mg/dL — ABNORMAL HIGH (ref 70–99)
Potassium: 4.4 mEq/L (ref 3.5–5.3)
Total Bilirubin: 0.4 mg/dL (ref 0.2–1.2)
Total Protein: 7.9 g/dL (ref 6.0–8.3)

## 2015-05-11 LAB — COMPLETE METABOLIC PANEL WITHOUT GFR
Chloride: 103 meq/L (ref 96–112)
Creat: 0.57 mg/dL (ref 0.50–1.10)
Sodium: 139 meq/L (ref 135–145)

## 2015-05-11 LAB — LIPID PANEL
Cholesterol: 209 mg/dL — ABNORMAL HIGH (ref 0–200)
HDL: 39 mg/dL — ABNORMAL LOW (ref >=46)
LDL Cholesterol: 136 mg/dL — ABNORMAL HIGH (ref 0–99)
Total CHOL/HDL Ratio: 5.4 Ratio
Triglycerides: 170 mg/dL — ABNORMAL HIGH (ref <150)
VLDL: 34 mg/dL (ref 0–40)

## 2015-05-11 LAB — TSH: TSH: 0.969 u[IU]/mL (ref 0.350–4.500)

## 2015-05-11 MED ORDER — ALPRAZOLAM 0.5 MG PO TABS: 0.5000 mg | ORAL_TABLET | Freq: Three times a day (TID) | ORAL | Status: DC | PRN

## 2015-05-11 NOTE — Progress Notes (Signed)
 Chief Complaint:  Chief Complaint  Patient presents with  . Annual Exam    HPI: Andrea [name] is a 49 y.o. female who is here for annual physical and labs for her chronic medical problems Pap smear many years ago Mammogram many years ago Normal colonoscopy in 2003 G1 L1, denies any vaginal symptoms, is married but not sexually active. She is not sure if she is up-to-date on her pneumonia vaccine since they implemented the new conditions for  PNA vaccine 13 and 23. Diabetes: Somewhat well controlled FBG are less than 150, denies any neuropathy, is not up-to-date on her eye exam. She sees Dr. Lorin Glass an optometrist at [facility] in [place], tolerating medications without any side effects Hypothyroid disease, hyperlipidemia, history of stroke, HTN: She is taking her medicines without any complications. Denies any chest pain, dizziness, palpitations. She denies any depression symptoms, hypothyroid symptoms such as decreased memory cognition, dry skin, weight gain that is unrelated to excessive eating.  Anxiety/depression: Stable on medications. Denies suicidal, homicidal ideation/hallucinations History of carotid stenosis status post Carotid endarterectomy, has recently seen vascular surgery for a checkup and left carotid stenosis is stable. There was like her to follow-up every 6 months for close checkup. She is on Plavix, aspirin, statin   BP Readings from Last 3 Encounters:  05/11/15 125/70  05/09/15 131/70  12/29/14 148/72     Past Medical History  Diagnosis Date  . Hypertension   . Anxiety     on Alprazolam  . Depression     on Effexor  . Diabetes mellitus without complication     7255  . Heart murmur     was seen by cardiologist in [facility] 2013  . Allergy   . Atrial fibrillation   . Peripheral vascular disease   . Carotid artery occlusion   . Stroke   . Anemia    Past Surgical History  Procedure Laterality Date  . Cholecystectomy    .  Cesarean section    . Tubal ligation    . Femoral bypass      x 2 on right side due to re-stenosis  . Left/right carotid artery      2003 and 2009 for right, right side is 100% occluded per patient; left side 2009  . 2 stents    . Carotid endarterectomy     History   Social History  . Marital Status: Married    Spouse Name: N/A  . Number of Children: 1  . Years of Education: College   Occupational History  . retired/disabled    Social History Main Topics  . Smoking status: Former Smoker    Quit date: 12/22/2010  . Smokeless tobacco: Never Used  . Alcohol Use: 0.0 oz/week    0 Standard drinks or equivalent per week     Comment: holidays/special occas 3-4 drinks  . Drug Use: No  . Sexual Activity: Yes   Other Topics Concern  . None   Social History Narrative   Patient lives at home with her husband and son   Patient is right handed   Patient drinks coffee daily   Family History  Problem Relation Age of Onset  . Diabetes Mother   . Heart disease Mother   . Hyperlipidemia Mother   . Hypertension Mother   . Heart disease Father   . Hyperlipidemia Father   . Hypertension Father   . Diabetes Maternal Grandmother   . Heart disease Maternal Grandmother   .  Hyperlipidemia Maternal Grandmother   . Cancer Paternal Grandmother   . Cancer Sister   . Varicose Veins Sister   . Heart attack Sister    Allergies  Allergen Reactions  . Other Hives    Contrast dye    Prior to Admission medications   Medication Sig Start Date End Date Taking? Authorizing Provider  ALPRAZolam Duanne Moron) 0.5 MG tablet Take 1 tablet (0.5 mg total) by mouth 3 (three) times daily as needed for anxiety. Can only refill after every 28 days 12/29/14  Yes  P , DO  amLODipine (NORVASC) 10 MG tablet Take 1 tablet (10 mg total) by mouth daily. 12/29/14  Yes  P , DO  aspirin 325 MG tablet Take 325 mg by mouth daily.   Yes Historical Provider, MD  Blood Pressure Monitoring (BLOOD PRESSURE CUFF)  MISC Use to check blood pressure. 09/06/14  Yes Chelle Jeffery, PA-C  clopidogrel (PLAVIX) 75 MG tablet Take 1 tablet (75 mg total) by mouth daily with breakfast. 06/09/14  Yes  P , DO  fenofibrate 54 MG tablet Take 1 tablet (54 mg total) by mouth daily. 12/29/14  Yes  P , DO  glipiZIDE (GLUCOTROL XL) 10 MG 24 hr tablet Take 1 tablet (10 mg total) by mouth daily with breakfast. 12/29/14  Yes  P , DO  glucose blood test strip Test blood sugar once daily. 09/06/14  Yes Chelle Jeffery, PA-C  Lancets MISC Test blood sugar once daily. 09/06/14  Yes Chelle Jeffery, PA-C  metFORMIN (GLUCOPHAGE) 1000 MG tablet Take 1 tablet (1,000 mg total) by mouth 2 (two) times daily with a meal. 07/07/14  Yes  P , DO  simvastatin (ZOCOR) 10 MG tablet Take 10 mg by mouth daily.   Yes Historical Provider, MD  venlafaxine XR (EFFEXOR-XR) 150 MG 24 hr capsule Take 1 capsule (150 mg total) by mouth daily with breakfast. 12/29/14  Yes  P , DO     ROS: The patient denies fevers, chills, night sweats, unintentional weight loss, chest pain, palpitations, wheezing, dyspnea on exertion, nausea, vomiting, abdominal pain, dysuria, hematuria, melena, numbness, weakness, or tingling.   All other systems have been reviewed and were otherwise negative with the exception of those mentioned in the HPI and as above.    PHYSICAL EXAM: Filed Vitals:   05/11/15 1106  BP: 125/70  Pulse: 74  Temp: 98.2 F (36.8 C)  Resp: 16   Filed Vitals:   05/11/15 1106  Height: $Remove'5\' 4"'pReRCXj$  (1.626 m)  Weight: 154 lb 12.8 oz (70.217 kg)   Body mass index is 26.56 kg/(m^2).  General: Alert, no acute distress HEENT:  Normocephalic, atraumatic, oropharynx patent. EOMI, PERRLA Endoscopic exam normal Cardiovascular:  Regular rate and rhythm, no rubs murmurs or gallops.  No Carotid bruits, radial pulse intact. No pedal edema.  Respiratory: Clear to auscultation bilaterally.  No wheezes, rales, or rhonchi.  No cyanosis, no use of  accessory musculature GI: No organomegaly, abdomen is soft and non-tender, positive bowel sounds.  No masses. Skin: No rashes. Neurologic: Facial musculature symmetric. Psychiatric: Patient is appropriate throughout our interaction. Lymphatic: No cervical lymphadenopathy Musculoskeletal: Gait intact.  GU exam: Normal cervix with that vaginal discharge, no rashes, no lesions. Negative CMT. No adnexal masses. Breasts and submandibular, subcortical, axillary lymph node exams were normal   LABS: Results for orders placed or performed in visit on 05/11/15  COMPLETE METABOLIC PANEL WITH GFR  Result Value Ref Range   Sodium 139 135 - 145 mEq/L   Potassium  4.4 3.5 - 5.3 mEq/L   Chloride 103 96 - 112 mEq/L   CO2 29 19 - 32 mEq/L   Glucose, Bld 103 (H) 70 - 99 mg/dL   BUN 10 6 - 23 mg/dL   Creat 0.57 0.50 - 1.10 mg/dL   Total Bilirubin 0.4 0.2 - 1.2 mg/dL   Alkaline Phosphatase 102 39 - 117 U/L   AST 19 0 - 37 U/L   ALT 20 0 - 35 U/L   Total Protein 7.9 6.0 - 8.3 g/dL   Albumin 4.5 3.5 - 5.2 g/dL   Calcium 9.7 8.4 - 10.5 mg/dL   GFR, Est African American >89 mL/min   GFR, Est Non African American >89 mL/min  CBC with Differential/Platelet  Result Value Ref Range   WBC 9.3 4.0 - 10.5 K/uL   RBC 5.31 (H) 3.87 - 5.11 MIL/uL   Hemoglobin 16.0 (H) 12.0 - 15.0 g/dL   HCT 47.0 (H) 36.0 - 46.0 %   MCV 88.5 78.0 - 100.0 fL   MCH 30.1 26.0 - 34.0 pg   MCHC 34.0 30.0 - 36.0 g/dL   RDW 14.3 11.5 - 15.5 %   Platelets 353 150 - 400 K/uL   MPV 10.9 8.6 - 12.4 fL   Neutrophils Relative % 60 43 - 77 %   Neutro Abs 5.6 1.7 - 7.7 K/uL   Lymphocytes Relative 32 12 - 46 %   Lymphs Abs 3.0 0.7 - 4.0 K/uL   Monocytes Relative 6 3 - 12 %   Monocytes Absolute 0.6 0.1 - 1.0 K/uL   Eosinophils Relative 1 0 - 5 %   Eosinophils Absolute 0.1 0.0 - 0.7 K/uL   Basophils Relative 1 0 - 1 %   Basophils Absolute 0.1 0.0 - 0.1 K/uL   Smear Review Criteria for review not met   Lipid panel  Result Value Ref  Range   Cholesterol 209 (H) 0 - 200 mg/dL   Triglycerides 170 (H) <150 mg/dL   HDL 39 (L) >=46 mg/dL   Total CHOL/HDL Ratio 5.4 Ratio   VLDL 34 0 - 40 mg/dL   LDL Cholesterol 136 (H) 0 - 99 mg/dL  TSH  Result Value Ref Range   TSH 0.969 0.350 - 4.500 uIU/mL  Microalbumin, urine  Result Value Ref Range   Microalb, Ur 0.7 <2.0 mg/dL  Pap IG w/ reflex to HPV when ASC-U  Result Value Ref Range   Specimen adequacy: SEE NOTE    FINAL DIAGNOSIS: SEE NOTE    COMMENTS: SEE NOTE    Cytotechnologist: SEE NOTE      EKG/XRAY:   Primary read interpreted by Dr. Marin Comment at [facility].   ASSESSMENT/PLAN: Encounter Diagnoses  Name Primary?  . Annual physical exam Yes  . Anxiety and depression   . Essential hypertension   . Hyperlipidemia   . Stroke   . Type 2 diabetes mellitus with other circulatory complications   . Carotid stenosis, bilateral   . Special screening for malignant neoplasms, colon   . Screening for breast cancer    49 year old female with a past medical history of anxiety/depression, hypertension, hyperlipidemia, stroke, type 2 diabetes complicated by vascular issues, carotid stenosis who presents for an annual exam. Annual labs with Pap pending Refer to GI for screening colonoscopy, overdue Her to breast center for mammography Refilled alprazolam, [place] controlled substance database was reviewed and no illegal activities were noted. She will continue with all her other medications as directed, she has refills for  one year. I will review her charts and see if she needs a second pneumonia vaccine. I believe she is up-to-date on her tetanus. We will check on that as well. Recommend : ADA diet, BP goal <140/90, daily foot exams, tobacco cessation if smoking, annual eye exam, annual flu vaccine, PNA vaccine if age and time appropriate.  Follow-up in 3-4 months  Gross sideeffects, risk and benefits, and alternatives of medications d/w patient. Patient is aware that all  medications have potential sideeffects and we are unable to predict every sideeffect or drug-drug interaction that may occur.  , [place], DO 05/16/2015 4:39 AM

## 2015-05-12 LAB — MICROALBUMIN, URINE: Microalb, Ur: 0.7 mg/dL (ref <2.0)

## 2015-05-14 LAB — PAP IG W/ RFLX HPV ASCU

## 2015-05-15 ENCOUNTER — Encounter: Payer: Self-pay | Admitting: Physician Assistant

## 2015-06-05 ENCOUNTER — Telehealth: Payer: Self-pay | Admitting: *Deleted

## 2015-06-05 ENCOUNTER — Ambulatory Visit (INDEPENDENT_AMBULATORY_CARE_PROVIDER_SITE_OTHER): Payer: Medicare PPO | Admitting: Physician Assistant

## 2015-06-05 ENCOUNTER — Encounter: Payer: Self-pay | Admitting: Physician Assistant

## 2015-06-05 VITALS — BP 110/70 | HR 64 | Ht 64.0 in | Wt 149.0 lb

## 2015-06-05 DIAGNOSIS — Z1211 Encounter for screening for malignant neoplasm of colon: Secondary | ICD-10-CM | POA: Diagnosis not present

## 2015-06-05 DIAGNOSIS — Z7901 Long term (current) use of anticoagulants: Secondary | ICD-10-CM | POA: Diagnosis not present

## 2015-06-05 MED ORDER — NA SULFATE-K SULFATE-MG SULF 17.5-3.13-1.6 GM/177ML PO SOLN: ORAL | Status: DC

## 2015-06-05 NOTE — Patient Instructions (Signed)
You have been scheduled for a colonoscopy. Please follow written instructions given to you at your visit today.  Please pick up your prep supplies at the pharmacy within the next 1-3 days.  [place]. If you use inhalers (even only as needed), please bring them with you on the day of your procedure. Your physician has requested that you go to www.startemmi.com and enter the access code given to you at your visit today. This web site gives a general overview about your procedure. However, you should still follow specific instructions given to you by our office regarding your preparation for the procedure.  We will call you once we hear back from Dr. Marin Comment regarding the Plavix medication.

## 2015-06-05 NOTE — Progress Notes (Signed)
Patient ID: Andrea [name], female   DOB: 11/14/1966, 49 y.o.   MRN: [id number]   Subjective:    Patient ID: Andrea [name], female    DOB: [birth year], 49 y.o.   MRN: [id number]  HPI Andrea [name] is a pleasant 49 year old white female referred by her PCP Dr. Marin Comment for screening colonoscopy. Patient relates that she did have what sounds like a flexible sigmoidoscopy greater than 10 years ago but is not aware that anything was found. Patient has no current GI complaints, specifically no abdominal pain and changes in bowel habits melena or hematochezia.   Family history is negative for GI disease. Patient does have significant vascular history and is disabled because of her vascular disease. She has adult onset diabetes mellitus, hypertension, carotid artery stenosis for which she has had bilateral endarterectomies, and is status post femoral bypass as well as previous iliac stenting. She says she may have had a TIA a year or so ago but definite diagnosis had not been made. She has been on Plavix and aspirin over the past several years.  No recent vascular interventions or events over the past year.  Review of Systems Pertinent positive and negative review of systems were noted in the above HPI section.  All other review of systems was otherwise negative.  Outpatient Encounter Prescriptions as of 06/05/2015  Medication Sig  . ALPRAZolam (XANAX) 0.5 MG tablet Take 1 tablet (0.5 mg total) by mouth 3 (three) times daily as needed for anxiety. Can only refill after every 28 days  . amLODipine (NORVASC) 10 MG tablet Take 1 tablet (10 mg total) by mouth daily.  Marland Kitchen aspirin 325 MG tablet Take 325 mg by mouth daily.  . Blood Pressure Monitoring (BLOOD PRESSURE CUFF) MISC Use to check blood pressure.  . clopidogrel (PLAVIX) 75 MG tablet Take 1 tablet (75 mg total) by mouth daily with breakfast.  . fenofibrate 54 MG tablet Take 1 tablet (54 mg total) by mouth daily.  Marland Kitchen glipiZIDE (GLUCOTROL XL) 10 MG 24 hr tablet  Take 1 tablet (10 mg total) by mouth daily with breakfast.  . glucose blood test strip Test blood sugar once daily.  . Lancets MISC Test blood sugar once daily.  . metFORMIN (GLUCOPHAGE) 1000 MG tablet Take 1 tablet (1,000 mg total) by mouth 2 (two) times daily with a meal.  . simvastatin (ZOCOR) 10 MG tablet Take 10 mg by mouth daily.  Marland Kitchen venlafaxine XR (EFFEXOR-XR) 150 MG 24 hr capsule Take 1 capsule (150 mg total) by mouth daily with breakfast.   No facility-administered encounter medications on file as of 06/05/2015.   Allergies  Allergen Reactions  . Other Hives    Contrast dye    Patient Active Problem List   Diagnosis Date Noted  . Bilateral carotid artery stenosis 10/18/2014  . TIA (transient ischemic attack) 08/29/2014  . HLD (hyperlipidemia) 08/29/2014  . DM (diabetes mellitus) with complications 51/76/1607  . HTN (hypertension), benign 08/29/2014  . Stroke 06/22/2014  . CVA (cerebral infarction) 06/22/2014  . PVD (peripheral vascular disease) 04/12/2014  . Occlusion and stenosis of carotid artery without mention of cerebral infarction 04/12/2014   History   Social History  . Marital Status: Married    Spouse Name: N/A  . Number of Children: 1  . Years of Education: College   Occupational History  . retired/disabled    Social History Main Topics  . Smoking status: Former Smoker    Quit date: 12/22/2010  . Smokeless tobacco: Never Used  .  Alcohol Use: No     Comment: holidays/special occas 3-4 drinks  . Drug Use: No  . Sexual Activity: Yes   Other Topics Concern  . Not on file   Social History Narrative   Patient lives at home with her husband and son   Patient is right handed   Patient drinks coffee daily    Ms. Dengel's family history includes Cancer in her paternal grandmother and sister; Diabetes in her maternal grandmother and mother; Heart attack in her sister; Heart disease in her father, maternal grandmother, and mother; Hyperlipidemia in her  father, maternal grandmother, and mother; Hypertension in her father and mother; Varicose Veins in her sister.      Objective:    Filed Vitals:   06/05/15 0841  BP: 110/70  Pulse: 64    Physical Exam  well-developed white female in no acute distress, pleasant blood pressure 110/70 pulse 64 height 5 foot 4 weight 149. HEENT ;nontraumatic normocephalic EOMI PERRLA sclera anicteric, Supple ;no JVD, Cardiovascular; regular rate and rhythm with S1-S2 no murmur or gallop, Pulmonary; clear bilaterally, Abdomen ;soft bowel sounds are present no palpable mass or hepatosplenomegaly she does have a palpable bypass graft in the suprapubic area, Rectal; exam not done, Ext; no clubbing cyanosis or edema skin warm and dry, Psych ;mood and affect appropriate       Assessment & Plan:   #1  49 yo female referred for colon cancer screening, asymptomatic and average risk #2 chronic antiplatelet therapy - on Plavix and ASA #3 AODM  #4 significant vascular disease, bilateral carotid endarterectomies.revious femoral Bypass and iliac stents,possible TIA  Plan; Schedule for Colonoscopy with Dr Deatra Ina- procedure discussed in detail with pt and she is agreeable to proceed. We will communicate with PCP/ Dr Marin Comment who prescribes her Plavix to assure it is reasonable to hold Plavix for 5 days prior to Colonoscopy, she will remain on ASA.    Amy S Esterwood PA-C 06/05/2015   Cc: Rikki Spearing P, DO

## 2015-06-05 NOTE — Telephone Encounter (Signed)
06/05/2015   RE: Andrea [name] DOB: June 04, 1966 MRN: [id number]   Dear  Dr. Glenford Bayley,,    We have scheduled the above patient for an endoscopic procedure. Our records show that she is on anticoagulation therapy.   Please advise as to how long the patient may come off her therapy of Plavix prior to the procedure, which is scheduled for 07-23-2015.  Please fax back/ or route the completed form to [place] at [phone].   Sincerely,    Amy Esterwood PA-C

## 2015-06-06 ENCOUNTER — Telehealth: Payer: Self-pay | Admitting: *Deleted

## 2015-06-06 NOTE — Telephone Encounter (Signed)
Called Dr. Marin Comment re: Plavix.  Patient can stop Plavix 1 week prior to procedure and then resume asap per Dr. Scot Dock.

## 2015-06-06 NOTE — Progress Notes (Signed)
Reviewed and agree with management. Morrisa Aldaba D. Keimora Swartout, M.D., FACG  

## 2015-06-06 NOTE — Telephone Encounter (Signed)
See phone note dated 06-06-2015.

## 2015-06-06 NOTE — Telephone Encounter (Signed)
LM for [facility] to call me back re plavix

## 2015-06-06 NOTE — Telephone Encounter (Signed)
I called and LM for Andrea [name] advising her to stop the Plavix on 07-18-15 and resume it on 8-2 ( day after colonoscopy). I asked her to call me if she had any questions.

## 2015-06-06 NOTE — Telephone Encounter (Signed)
-----   Message from Glenford Bayley, DO sent at 06/06/2015 12:22 PM EDT ----- Regarding: plavix for colonscopy Andrea [name]-  Spoke with Vascular and they said she can be off her plavix 1 week prior to colonscopy and then resume the plavix as soon as possible after her colonscopy.   Thank s,  Dr Marin Comment    06/05/2015   BV:[id number] A Heathcock DOB:1966-01-10 [id number]:[id number]   Dear Dr. Glenford Bayley,,    We have scheduled the above patient for an endoscopic procedure. Our records show that she is on anticoagulation therapy.   Please advise as to how long the patient may come off her therapy of Plavix prior to the procedure, which is scheduled for 07-23-2015.  Please fax back/ or route the completed form to [place] at [phone].   Sincerely,    Amy Esterwood PA-C

## 2015-06-08 NOTE — Telephone Encounter (Signed)
Left 2nd message for the patient to advise she can stop the Plavix on 07-18-2015 and resume the Plavix on 07-24-2015.

## 2015-07-21 ENCOUNTER — Telehealth: Payer: Self-pay | Admitting: Gastroenterology

## 2015-07-21 NOTE — Telephone Encounter (Signed)
Has colonoscopy scheduled with [facility] on Monday but 4 pharmacies she has called do not have SuPrep in stock this weekend. Advised to use split dose Miralax/Gatorade prep instead. Instructions given. Pt appears to have a good understanding of all prep instructions.

## 2015-07-23 ENCOUNTER — Ambulatory Visit (AMBULATORY_SURGERY_CENTER): Payer: Medicare PPO | Admitting: Gastroenterology

## 2015-07-23 ENCOUNTER — Encounter: Payer: Self-pay | Admitting: Gastroenterology

## 2015-07-23 VITALS — BP 119/73 | HR 63 | Temp 97.2°F | Resp 18 | Ht 64.0 in | Wt 149.0 lb

## 2015-07-23 DIAGNOSIS — Z1211 Encounter for screening for malignant neoplasm of colon: Secondary | ICD-10-CM | POA: Diagnosis not present

## 2015-07-23 DIAGNOSIS — K635 Polyp of colon: Secondary | ICD-10-CM | POA: Diagnosis not present

## 2015-07-23 DIAGNOSIS — D120 Benign neoplasm of cecum: Secondary | ICD-10-CM

## 2015-07-23 DIAGNOSIS — D125 Benign neoplasm of sigmoid colon: Secondary | ICD-10-CM | POA: Diagnosis not present

## 2015-07-23 LAB — GLUCOSE, CAPILLARY
GLUCOSE-CAPILLARY: 81 mg/dL (ref 65–99)
Glucose-Capillary: 84 mg/dL (ref 65–99)

## 2015-07-23 MED ORDER — SODIUM CHLORIDE 0.9 % IV SOLN: 500.0000 mL | INTRAVENOUS | Status: DC

## 2015-07-23 NOTE — Progress Notes (Signed)
Called to room to assist during endoscopic procedure.  Patient ID and intended procedure confirmed with present staff. Received instructions for my participation in the procedure from the performing physician.  

## 2015-07-23 NOTE — Patient Instructions (Addendum)
Resume Plavix in 72 hours.  YOU HAD AN ENDOSCOPIC PROCEDURE TODAY AT [facility]:   Refer to the procedure report that was given to you for any specific questions about what was found during the examination.  If the procedure report does not answer your questions, please call your gastroenterologist to clarify.  If you requested that your care partner not be given the details of your procedure findings, then the procedure report has been included in a sealed envelope for you to review at your convenience later.  YOU SHOULD EXPECT: Some feelings of bloating in the abdomen. Passage of more gas than usual.  Walking can help get rid of the air that was put into your GI tract during the procedure and reduce the bloating. If you had a lower endoscopy (such as a colonoscopy or flexible sigmoidoscopy) you may notice spotting of blood in your stool or on the toilet paper. If you underwent a bowel prep for your procedure, you may not have a normal bowel movement for a few days.  Please Note:  You might notice some irritation and congestion in your nose or some drainage.  This is from the oxygen used during your procedure.  There is no need for concern and it should clear up in a day or so.  SYMPTOMS TO REPORT IMMEDIATELY:   Following lower endoscopy (colonoscopy or flexible sigmoidoscopy):  Excessive amounts of blood in the stool  Significant tenderness or worsening of abdominal pains  Swelling of the abdomen that is new, acute  Fever of 100F or higher  For urgent or emergent issues, a gastroenterologist can be reached at any hour by calling [phone].   DIET: Your first meal following the procedure should be a small meal and then it is ok to progress to your normal diet. Heavy or fried foods are harder to digest and may make you feel nauseous or bloated.  Likewise, meals heavy in dairy and vegetables can increase bloating.  Drink plenty of fluids but you should avoid alcoholic  beverages for 24 hours.  ACTIVITY:  You should plan to take it easy for the rest of today and you should NOT DRIVE or use heavy machinery until tomorrow (because of the sedation medicines used during the test).    FOLLOW UP: Our staff will call the number listed on your records the next business day following your procedure to check on you and address any questions or concerns that you may have regarding the information given to you following your procedure. If we do not reach you, we will leave a message.  However, if you are feeling well and you are not experiencing any problems, there is no need to return our call.  We will assume that you have returned to your regular daily activities without incident.  If any biopsies were taken you will be contacted by phone or by letter within the next 1-3 weeks.  Please call us at [phone] if you have not heard about the biopsies in 3 weeks.    SIGNATURES/CONFIDENTIALITY: You and/or your care partner have signed paperwork which will be entered into your electronic medical record.  These signatures attest to the fact that that the information above on your After Visit Summary has been reviewed and is understood.  Full responsibility of the confidentiality of this discharge information lies with you and/or your care-partner.  Resume Plavix in 72 hours Next colonoscopy determined by pathology results.  Please review polyp handout provided.

## 2015-07-23 NOTE — Op Note (Signed)
[place]  [employer]. [place], [address]   COLONOSCOPY PROCEDURE REPORT  PATIENT: Ledonna, Dormer  MR#: [id number] BIRTHDATE: Apr 26, 1966 , 2  yrs. old GENDER: female ENDOSCOPIST: Inda Castle, MD REFERRED BY: PROCEDURE DATE:  07/23/2015 PROCEDURE:   Colonoscopy, screening and Colonoscopy with snare polypectomy First Screening Colonoscopy - Avg.  risk and is 50 yrs.  old or older Yes.  Prior Negative Screening - Now for repeat screening. N/A  History of Adenoma - Now for follow-up colonoscopy & has been > or = to 3 yrs.  N/A  Polyps removed today? Yes ASA CLASS:   Class II INDICATIONS:Colorectal Neoplasm Risk Assessment for this procedure is average risk. MEDICATIONS: Monitored anesthesia care and Propofol 320 mg IV  DESCRIPTION OF PROCEDURE:   After the risks benefits and alternatives of the procedure were thoroughly explained, informed consent was obtained.  The digital rectal exam revealed no abnormalities of the rectum.   The [id number]  endoscope was introduced through the anus and advanced to the cecum, which was identified by both the appendix and ileocecal valve. No adverse events experienced.   The quality of the prep was (Suprep was used) adequate  The instrument was then slowly withdrawn as the colon was fully examined. Estimated blood loss is zero unless otherwise noted in this procedure report.      COLON FINDINGS: A flat polyp measuring 3 mm in size was found at the cecum.  A polypectomy was performed with a cold snare.  The resection was complete, the polyp tissue was completely retrieved and sent to histology.   Two sessile polyps measuring 4 mm in size were found in the sigmoid colon.  Polypectomies were performed with a cold snare.  The resection was complete, the polyp tissue was completely retrieved and sent to histology.  Retroflexed views revealed no abnormalities. The time to cecum = 6.2 Withdrawal time = 11.3    The scope was withdrawn and the procedure completed. COMPLICATIONS: There were no immediate complications.  ENDOSCOPIC IMPRESSION: 1.   Flat polyp was found at the cecum; polypectomy was performed with a cold snare 2.   Two sessile polyps were found in the sigmoid colon; polypectomies were performed with a cold snare  RECOMMENDATIONS: Colonoscopy 3-5 years pending pathology findings.  If no precancerous changes are seen then recommend follow-up colonoscopy in 10 years.  eSigned:  Inda Castle, MD 07/23/2015 4:08 PM   cc: Rikki Spearing, DO   PATIENT NAME:  Rooney, Gladwin MR#: [id number]

## 2015-07-23 NOTE — Progress Notes (Signed)
To recovery, report to Myers, RN, VSS. 

## 2015-07-24 ENCOUNTER — Telehealth: Payer: Self-pay | Admitting: *Deleted

## 2015-07-24 NOTE — Telephone Encounter (Signed)
No answer, message left for the patient. 

## 2015-07-27 ENCOUNTER — Encounter: Payer: Self-pay | Admitting: Gastroenterology

## 2015-07-30 ENCOUNTER — Encounter: Payer: Self-pay | Admitting: Family Medicine

## 2015-08-01 ENCOUNTER — Other Ambulatory Visit: Payer: Self-pay | Admitting: Family Medicine

## 2015-08-01 DIAGNOSIS — Z1239 Encounter for other screening for malignant neoplasm of breast: Secondary | ICD-10-CM

## 2015-08-02 ENCOUNTER — Other Ambulatory Visit: Payer: Self-pay

## 2015-08-02 DIAGNOSIS — Z1231 Encounter for screening mammogram for malignant neoplasm of breast: Secondary | ICD-10-CM

## 2015-08-13 ENCOUNTER — Ambulatory Visit (INDEPENDENT_AMBULATORY_CARE_PROVIDER_SITE_OTHER): Payer: Medicare PPO | Admitting: Family Medicine

## 2015-08-13 ENCOUNTER — Encounter: Payer: Self-pay | Admitting: Family Medicine

## 2015-08-13 VITALS — BP 168/84 | HR 67 | Temp 98.7°F | Resp 16 | Ht 64.0 in | Wt 147.6 lb

## 2015-08-13 DIAGNOSIS — Z23 Encounter for immunization: Secondary | ICD-10-CM | POA: Diagnosis not present

## 2015-08-13 DIAGNOSIS — E119 Type 2 diabetes mellitus without complications: Secondary | ICD-10-CM | POA: Diagnosis not present

## 2015-08-13 DIAGNOSIS — I6523 Occlusion and stenosis of bilateral carotid arteries: Secondary | ICD-10-CM

## 2015-08-13 DIAGNOSIS — J069 Acute upper respiratory infection, unspecified: Secondary | ICD-10-CM | POA: Diagnosis not present

## 2015-08-13 DIAGNOSIS — E785 Hyperlipidemia, unspecified: Secondary | ICD-10-CM

## 2015-08-13 LAB — CBC
HCT: 45 % (ref 36.0–46.0)
Hemoglobin: 15.3 g/dL — ABNORMAL HIGH (ref 12.0–15.0)
MCH: 30.2 pg (ref 26.0–34.0)
MCHC: 34 g/dL (ref 30.0–36.0)
MCV: 88.9 fL (ref 78.0–100.0)
MPV: 10.1 fL (ref 8.6–12.4)
Platelets: 297 10*3/uL (ref 150–400)
RBC: 5.06 MIL/uL (ref 3.87–5.11)
RDW: 14 % (ref 11.5–15.5)
WBC: 7.8 10*3/uL (ref 4.0–10.5)

## 2015-08-13 LAB — LIPID PANEL
CHOLESTEROL: 208 mg/dL — AB (ref 125–200)
HDL: 31 mg/dL — ABNORMAL LOW (ref >=46)
LDL Cholesterol: 115 mg/dL (ref <130)
Total CHOL/HDL Ratio: 6.7 Ratio — ABNORMAL HIGH (ref <=5.0)
Triglycerides: 311 mg/dL — ABNORMAL HIGH (ref <150)
VLDL: 62 mg/dL — ABNORMAL HIGH (ref <30)

## 2015-08-13 LAB — HEMOGLOBIN A1C
Hgb A1c MFr Bld: 6.3 % — ABNORMAL HIGH (ref <5.7)
Mean Plasma Glucose: 134 mg/dL — ABNORMAL HIGH (ref <117)

## 2015-08-13 NOTE — Patient Instructions (Signed)
Good to see you today- I will be in touch with your labs asap Please take your amlodipine when you get home for your BP! It appears that you have a viral upper respiratory infection.   Use tylenol as needed for facial pressure, and let me know if not feeling better in the next week or so- Sooner if worse.

## 2015-08-13 NOTE — Progress Notes (Addendum)
[facility] [address] [place] [address] [phone]  Date:  08/13/2015   Name:  Andrea [name]   DOB:  10-10-1966   MRN:  [id number]  PCP:  Leotis Pain, DO    Chief Complaint: follow up labs and possible sinus infection   History of Present Illness:  Andrea [name] is a 49 y.o. very pleasant female patient who presents with the following:  Here today to follow-up labs and for possible sinus infection.  Need to recheck her cholesterol, blood sugar, CBC today- last done in May of this year  She notes sinus pressure and pain, PND that is irritating her throat.  She just noted these sx this am.  Her left ear is popping No cough. No fever.    She is fasting today.   She does have DM, takes metformin BID.  She was on glipizide but stopped taking it- her glucose has looked good without it  She states that her BP is supposed to be a little higher than most people as she has carotid stenosis and only has flow through the left carotid Goal BP 130-150/ 70- 90.   She did NOT take her usual morning dose of amlodipine this am so this is likely why his BP is high now  Lab Results  Component Value Date   HGBA1C 6.0 09/22/2014     Patient Active Problem List   Diagnosis Date Noted  . Bilateral carotid artery stenosis 10/18/2014  . TIA (transient ischemic attack) 08/29/2014  . HLD (hyperlipidemia) 08/29/2014  . DM (diabetes mellitus) with complications 78/46/9629  . HTN (hypertension), benign 08/29/2014  . Stroke 06/22/2014  . CVA (cerebral infarction) 06/22/2014  . PVD (peripheral vascular disease) 04/12/2014  . Occlusion and stenosis of carotid artery without mention of cerebral infarction 04/12/2014    Past Medical History  Diagnosis Date  . Hypertension   . Anxiety     on Alprazolam  . Depression     on Effexor  . Diabetes mellitus without complication     5284  . Heart murmur     was seen by cardiologist in [facility] 2013  .  Allergy   . Atrial fibrillation   . Peripheral vascular disease   . Carotid artery occlusion   . Stroke   . Anemia     Past Surgical History  Procedure Laterality Date  . Cholecystectomy    . Cesarean section    . Tubal ligation    . Femoral bypass      x 2 on right side due to re-stenosis  . Left/right carotid artery      2003 and 2009 for right, right side is 100% occluded per patient; left side 2009  . 2 stents    . Carotid endarterectomy      Social History  Substance Use Topics  . Smoking status: Former Smoker    Quit date: 12/22/2010  . Smokeless tobacco: Never Used  . Alcohol Use: No     Comment: holidays/special occas 3-4 drinks    Family History  Problem Relation Age of Onset  . Diabetes Mother   . Heart disease Mother   . Hyperlipidemia Mother   . Hypertension Mother   . Heart disease Father   . Hyperlipidemia Father   . Hypertension Father   . Diabetes Maternal Grandmother   . Heart disease Maternal Grandmother   . Hyperlipidemia Maternal Grandmother   . Cancer Paternal Grandmother   .  Cancer Sister   . Varicose Veins Sister   . Heart attack Sister     Allergies  Allergen Reactions  . Other Hives    Contrast dye     Medication list has been reviewed and updated.  Current Outpatient Prescriptions on File Prior to Visit  Medication Sig Dispense Refill  . ALPRAZolam (XANAX) 0.5 MG tablet Take 1 tablet (0.5 mg total) by mouth 3 (three) times daily as needed for anxiety. Can only refill after every 28 days 90 tablet 3  . amLODipine (NORVASC) 10 MG tablet Take 1 tablet (10 mg total) by mouth daily. 90 tablet 3  . aspirin 325 MG tablet Take 325 mg by mouth daily.    . Blood Pressure Monitoring (BLOOD PRESSURE CUFF) MISC Use to check blood pressure. 1 each 0  . clopidogrel (PLAVIX) 75 MG tablet Take 1 tablet (75 mg total) by mouth daily with breakfast. 90 tablet 3  . fenofibrate 54 MG tablet Take 1 tablet (54 mg total) by mouth daily. 90 tablet 3  .  glipiZIDE (GLUCOTROL XL) 10 MG 24 hr tablet Take 1 tablet (10 mg total) by mouth daily with breakfast. (Patient not taking: Reported on 08/13/2015) 90 tablet 3  . glucose blood test strip Test blood sugar once daily. 100 each 2  . Lancets MISC Test blood sugar once daily. 100 each 2  . metFORMIN (GLUCOPHAGE) 1000 MG tablet Take 1 tablet (1,000 mg total) by mouth 2 (two) times daily with a meal. 180 tablet 3  . simvastatin (ZOCOR) 10 MG tablet Take 10 mg by mouth daily.    Marland Kitchen venlafaxine XR (EFFEXOR-XR) 150 MG 24 hr capsule Take 1 capsule (150 mg total) by mouth daily with breakfast. 90 capsule 3   No current facility-administered medications on file prior to visit.    Review of Systems:  As per HPI- otherwise negative.   Physical Examination: Filed Vitals:   08/13/15 1029  BP: 168/84  Pulse: 67  Temp: 98.7 F (37.1 C)  Resp: 16   Filed Vitals:   08/13/15 1029  Height: 5\' 4"  (1.626 m)  Weight: 147 lb 9.6 oz (66.951 kg)   Body mass index is 25.32 kg/(m^2). Ideal Body Weight: Weight in (lb) to have BMI = 25: 145.3  GEN: WDWN, NAD, Non-toxic, A & O x 3, looks well HEENT: Atraumatic, Normocephalic. Neck supple. No masses, No LAD.  Bilateral TM wnl, oropharynx normal.  PEERL,EOMI.   Bilateral carotic bruit, scars on neck from carotid surgery Ears and Nose: No external deformity. CV: RRR, No M/G/R. No JVD. No thrill. No extra heart sounds. PULM: CTA B, no wheezes, crackles, rhonchi. No retractions. No resp. distress. No accessory muscle use. EXTR: No c/c/e NEURO Normal gait.  PSYCH: Normally interactive. Conversant. Not depressed or anxious appearing.  Calm demeanor.    Assessment and Plan: Diabetes mellitus type 2, controlled - Plan: Hemoglobin A1c  Flu vaccine need - Plan: Flu Vaccine QUAD 36+ mos IM  Dyslipidemia - Plan: Lipid panel  Carotid artery stenosis, bilateral - Plan: CBC  Viral URI - Plan: CBC  Follow-up on labs today and will be in touch with her  asap Reassured about URI sx, onset today Let us know if not better soon   Signed Lamar Blinks, MD  Received her labs Results for orders placed or performed in visit on 08/13/15  CBC  Result Value Ref Range   WBC 7.8 4.0 - 10.5 K/uL   RBC 5.06 3.87 - 5.11 MIL/uL  Hemoglobin 15.3 (H) 12.0 - 15.0 g/dL   HCT 45.0 36.0 - 46.0 %   MCV 88.9 78.0 - 100.0 fL   MCH 30.2 26.0 - 34.0 pg   MCHC 34.0 30.0 - 36.0 g/dL   RDW 14.0 11.5 - 15.5 %   Platelets 297 150 - 400 K/uL   MPV 10.1 8.6 - 12.4 fL  Lipid panel  Result Value Ref Range   Cholesterol 208 (H) 125 - 200 mg/dL   Triglycerides 311 (H) <150 mg/dL   HDL 31 (L) >=46 mg/dL   Total CHOL/HDL Ratio 6.7 (H) <=5.0 Ratio   VLDL 62 (H) <30 mg/dL   LDL Cholesterol 115 <130 mg/dL  Hemoglobin A1c  Result Value Ref Range   Hgb A1c MFr Bld 6.3 (H) <5.7 %   Mean Plasma Glucose 134 (H) <117 mg/dL   Sent a [employer]. Will increase her dose of simvastatin but need to ask her about her gemfibrozil use.  Will call her back  Discussed with her on 8/25- she is no longer taking gemfibrozil.  Will increase her simvastatin to 20 mg She will see me in 3 months

## 2015-08-15 ENCOUNTER — Other Ambulatory Visit: Payer: Self-pay | Admitting: Family Medicine

## 2015-08-15 DIAGNOSIS — E785 Hyperlipidemia, unspecified: Secondary | ICD-10-CM

## 2015-08-15 DIAGNOSIS — I6523 Occlusion and stenosis of bilateral carotid arteries: Secondary | ICD-10-CM

## 2015-08-16 NOTE — Telephone Encounter (Signed)
Dr Lorelei Pont, I saw your plan to increase pt's simvastatin for double the dose, so changed the strength on the req that came in. It looks like you were still waiting to hear back from pt before Rxing though? OK to RF Plavix too?

## 2015-08-17 ENCOUNTER — Ambulatory Visit
Admission: RE | Admit: 2015-08-17 | Discharge: 2015-08-17 | Disposition: A | Discharge status: Home / Self Care | Payer: Medicare PPO | Source: Ambulatory Visit | Attending: Family Medicine | Admitting: Family Medicine

## 2015-08-17 DIAGNOSIS — Z1231 Encounter for screening mammogram for malignant neoplasm of breast: Secondary | ICD-10-CM

## 2015-11-01 ENCOUNTER — Encounter: Payer: Self-pay | Admitting: Family Medicine

## 2015-11-01 ENCOUNTER — Telehealth: Payer: Self-pay | Admitting: Family Medicine

## 2015-11-01 NOTE — Telephone Encounter (Signed)
Left a message for patient to return call and see if she has had a eye exam and if so where and when?  If not, we need to get her scheduled for one.

## 2015-11-05 ENCOUNTER — Encounter: Payer: Self-pay | Admitting: Family

## 2015-11-07 ENCOUNTER — Other Ambulatory Visit: Payer: Self-pay | Admitting: Family Medicine

## 2015-11-07 ENCOUNTER — Ambulatory Visit: Payer: Medicare PPO | Admitting: Family

## 2015-11-07 ENCOUNTER — Inpatient Hospital Stay (HOSPITAL_COMMUNITY): Admission: RE | Admit: 2015-11-07 | Payer: Medicare PPO | Source: Ambulatory Visit

## 2015-11-07 ENCOUNTER — Encounter (HOSPITAL_COMMUNITY): Payer: Medicare PPO

## 2015-11-14 ENCOUNTER — Ambulatory Visit: Payer: Self-pay | Admitting: Family Medicine

## 2015-11-14 ENCOUNTER — Other Ambulatory Visit: Payer: Self-pay

## 2015-11-14 DIAGNOSIS — E119 Type 2 diabetes mellitus without complications: Secondary | ICD-10-CM

## 2015-11-14 MED ORDER — METFORMIN HCL 1000 MG PO TABS: 1000.0000 mg | ORAL_TABLET | Freq: Two times a day (BID) | ORAL | Status: DC

## 2015-11-19 ENCOUNTER — Encounter: Payer: Self-pay | Admitting: Family

## 2015-11-21 ENCOUNTER — Ambulatory Visit (INDEPENDENT_AMBULATORY_CARE_PROVIDER_SITE_OTHER): Payer: Medicare PPO | Admitting: Family

## 2015-11-21 ENCOUNTER — Ambulatory Visit (HOSPITAL_COMMUNITY)
Admission: RE | Admit: 2015-11-21 | Discharge: 2015-11-21 | Disposition: A | Discharge status: Home / Self Care | Payer: Medicare PPO | Source: Ambulatory Visit | Attending: Family | Admitting: Family

## 2015-11-21 ENCOUNTER — Encounter: Payer: Self-pay | Admitting: Family

## 2015-11-21 VITALS — BP 138/75 | HR 69 | Temp 98.1°F | Resp 16 | Ht 63.5 in | Wt 148.0 lb

## 2015-11-21 DIAGNOSIS — E119 Type 2 diabetes mellitus without complications: Secondary | ICD-10-CM | POA: Diagnosis not present

## 2015-11-21 DIAGNOSIS — Z87891 Personal history of nicotine dependence: Secondary | ICD-10-CM

## 2015-11-21 DIAGNOSIS — I10 Essential (primary) hypertension: Secondary | ICD-10-CM | POA: Insufficient documentation

## 2015-11-21 DIAGNOSIS — I6523 Occlusion and stenosis of bilateral carotid arteries: Secondary | ICD-10-CM

## 2015-11-21 DIAGNOSIS — Z9889 Other specified postprocedural states: Secondary | ICD-10-CM | POA: Diagnosis not present

## 2015-11-21 DIAGNOSIS — I6521 Occlusion and stenosis of right carotid artery: Secondary | ICD-10-CM | POA: Diagnosis not present

## 2015-11-21 DIAGNOSIS — Z48812 Encounter for surgical aftercare following surgery on the circulatory system: Secondary | ICD-10-CM | POA: Diagnosis not present

## 2015-11-21 NOTE — Progress Notes (Signed)
[facility] [place] HISTORY AND PHYSICAL   MRN : [id number]  History of Present Illness:   Andrea [name] is a 49 y.o. female patient of Dr. Scot Dock who has undergone previous bilateral carotid endarterectomies by Dr. Drucie Opitz and also a femorofemoral bypass graft by Dr. Amedeo Plenty. She had described some left-sided weakness in July 2015 which was felt to be related to hypotension. She has a known right internal carotid artery occlusion. She had mild disease on the left which is actually improved at the time of her last follow up study.  She denies any subsequent history of stroke, TIAs, expressive or receptive aphasia, or amaurosis fugax. She denies significant claudication or rest pain.  She is on aspirin, Plavix, and a statin. (She is on simvastatin).  She has right hip pain joint pain that is worse with crossing her legs, is not exacerbated by walking.  She denies any history of MI or cardiac stents, states she does have heart murmur.   Pt Diabetic: Yes, A1C in August 2016 was 6.3 (review of records) Pt smoker: former smoker, quit in 2012, smoked for about 30 years  Pt meds include: Statin :Yes Betablocker: No ASA: Yes Other anticoagulants/antiplatelets: Plavix   Current Outpatient Prescriptions  Medication Sig Dispense Refill  . ALPRAZolam (XANAX) 0.5 MG tablet TAKE ONE TABLET BY MOUTH THREE TIMES DAILY AS NEEDED FOR ANXIETY -  CAN  ONLY  REFILL  AFTER  EVERY  28  DAYS 90 tablet 0  . amLODipine (NORVASC) 10 MG tablet Take 1 tablet (10 mg total) by mouth daily. 90 tablet 3  . aspirin 325 MG tablet Take 325 mg by mouth daily.    . Blood Pressure Monitoring (BLOOD PRESSURE CUFF) MISC Use to check blood pressure. 1 each 0  . clopidogrel (PLAVIX) 75 MG tablet TAKE ONE TABLET BY MOUTH ONCE DAILY WITH BREAKFAST 90 tablet 3  . glipiZIDE (GLUCOTROL XL) 10 MG 24 hr tablet Take 1 tablet (10 mg total) by mouth daily with breakfast. 90 tablet 3  . glucose blood test  strip Test blood sugar once daily. 100 each 2  . Lancets MISC Test blood sugar once daily. 100 each 2  . metFORMIN (GLUCOPHAGE) 1000 MG tablet Take 1 tablet (1,000 mg total) by mouth 2 (two) times daily with a meal. PATIENT NEEDS OFFICE VISIT FOR ADDITIONAL REFILLS 60 tablet 0  . simvastatin (ZOCOR) 20 MG tablet Take 1 tablet (20 mg total) by mouth at bedtime. 90 tablet 3  . venlafaxine XR (EFFEXOR-XR) 150 MG 24 hr capsule Take 1 capsule (150 mg total) by mouth daily with breakfast. 90 capsule 3   No current facility-administered medications for this visit.    Past Medical History  Diagnosis Date  . Hypertension   . Anxiety     on Alprazolam  . Depression     on Effexor  . Diabetes mellitus without complication ([place])     [id number]  . Heart murmur     was [facility] by cardiologist in [facility] 2013  . Allergy   . Atrial fibrillation ([place])   . Peripheral vascular disease ([place])   . Carotid artery occlusion   . Stroke ([place])   . Anemia     Social History Social History  Substance Use Topics  . Smoking status: Former Smoker    Quit date: 12/22/2010  . Smokeless tobacco: Never Used  . Alcohol Use: No     Comment: holidays/special occas 3-4 drinks    Family  History Family History  Problem Relation Age of Onset  . Diabetes Mother   . Heart disease Mother   . Hyperlipidemia Mother   . Hypertension Mother   . Heart disease Father   . Hyperlipidemia Father   . Hypertension Father   . Diabetes Maternal Grandmother   . Heart disease Maternal Grandmother   . Hyperlipidemia Maternal Grandmother   . Cancer Paternal Grandmother   . Cancer Sister   . Varicose Veins Sister   . Heart attack Sister     Surgical History Past Surgical History  Procedure Laterality Date  . Cholecystectomy    . Cesarean section    . Tubal ligation    . Femoral bypass      x 2 on right side due to re-stenosis  . Left/right carotid artery      2003 and 2009 for right, right side is 100% occluded per  patient; left side 2009  . 2 stents    . Carotid endarterectomy      Allergies  Allergen Reactions  . Other Hives    Contrast dye     Current Outpatient Prescriptions  Medication Sig Dispense Refill  . ALPRAZolam (XANAX) 0.5 MG tablet TAKE ONE TABLET BY MOUTH THREE TIMES DAILY AS NEEDED FOR ANXIETY -  CAN  ONLY  REFILL  AFTER  EVERY  28  DAYS 90 tablet 0  . amLODipine (NORVASC) 10 MG tablet Take 1 tablet (10 mg total) by mouth daily. 90 tablet 3  . aspirin 325 MG tablet Take 325 mg by mouth daily.    . Blood Pressure Monitoring (BLOOD PRESSURE CUFF) MISC Use to check blood pressure. 1 each 0  . clopidogrel (PLAVIX) 75 MG tablet TAKE ONE TABLET BY MOUTH ONCE DAILY WITH BREAKFAST 90 tablet 3  . glipiZIDE (GLUCOTROL XL) 10 MG 24 hr tablet Take 1 tablet (10 mg total) by mouth daily with breakfast. 90 tablet 3  . glucose blood test strip Test blood sugar once daily. 100 each 2  . Lancets MISC Test blood sugar once daily. 100 each 2  . metFORMIN (GLUCOPHAGE) 1000 MG tablet Take 1 tablet (1,000 mg total) by mouth 2 (two) times daily with a meal. PATIENT NEEDS OFFICE VISIT FOR ADDITIONAL REFILLS 60 tablet 0  . simvastatin (ZOCOR) 20 MG tablet Take 1 tablet (20 mg total) by mouth at bedtime. 90 tablet 3  . venlafaxine XR (EFFEXOR-XR) 150 MG 24 hr capsule Take 1 capsule (150 mg total) by mouth daily with breakfast. 90 capsule 3   No current facility-administered medications for this visit.     REVIEW OF SYSTEMS: See HPI for pertinent positives and negatives.  Physical Examination Filed Vitals:   11/21/15 1044 11/21/15 1045  BP: 138/79 138/75  Pulse: 74 69  Temp:  98.1 F (36.7 C)  TempSrc:  Oral  Resp:  16  Height:  5' 3.5" (1.613 m)  Weight:  148 lb (67.132 kg)  SpO2:  100%   Body mass index is 25.8 kg/(m^2).  General:  WDWN in NAD Gait: Normal HENT: WNL Eyes: Pupils equal Pulmonary: normal non-labored breathing, no rales, rhonchi, or wheezing Cardiac: RRR, no murmur  detected  Abdomen: soft, NT, no masses palpated Skin: no rashes, no ulcers, no cellulitis.   VASCULAR EXAM  Carotid Bruits Right Left   Positive Positive    Aorta is mildly palpable Femoral to femoral graft bypass is palpable  VASCULAR EXAM: Extremities without ischemic changes, without Gangrene; without open wounds.                                                                                                          LE Pulses Right Left       FEMORAL  1+ palpable  2+ palpable        POPLITEAL  not palpable   not palpable       POSTERIOR TIBIAL  not palpable   not palpable        DORSALIS PEDIS      ANTERIOR TIBIAL 2+ palpable  2+ palpable     Musculoskeletal: no muscle wasting or atrophy; no peripheral edema  Neurologic: A&O X 3; Appropriate Affect ;  SENSATION: normal; MOTOR FUNCTION: 5/5 Symmetric, CN 2-12 intact Speech is fluent/normal    Non-Invasive Vascular Imaging (11/21/2015):  Carotid Duplex: Known occlusion of the right ICA and CCA. Left CEA site with high end 60-79% proximal ICA stenosis which appears to be compensatory flow, and a greater than 50% left terminal CCA stenosis. No significant change compared to 05/09/15.     ASSESSMENT:  JANN DEWAN is a 49 y.o. female who has undergone previous bilateral carotid endarterectomies by Dr. Drucie Opitz and also a femorofemoral bypass graft by Dr. Amedeo Plenty. She had described some left-sided weakness in July 2015 which was felt to be related to hypotension. She has a known right internal carotid artery occlusion. She had mild disease on the left which is actually improved at the time of her last follow up study.  She denies any subsequent history of stroke, TIAs, expressive or receptive aphasia, or amaurosis fugax. She denies significant claudication or rest pain.   She had normal ABI's in May of 2016.  Today's carotid duplex suggests known occlusion of the right ICA and CCA. Left  CEA site with high end 60-79% proximal ICA stenosis which appears to be compensatory flow, and a greater than 50% left terminal CCA stenosis. No significant change compared to 05/09/15.   PLAN:   Continue graduated walking program.  Based on today's exam and non-invasive vascular lab results, the patient will follow up in 6 months  with the following tests: ABI's and carotid duplex. I discussed in depth with the patient the nature of atherosclerosis, and emphasized the importance of maximal medical management including strict control of blood pressure, blood glucose, and lipid levels, obtaining regular exercise, and cessation of smoking.  The patient is aware that without maximal medical management the underlying atherosclerotic disease process will progress, limiting the benefit of any interventions.  The patient was given information about stroke prevention and what symptoms should prompt the patient to seek immediate medical care.  The patient was given information about PAD including signs, symptoms, treatment, what symptoms should prompt the patient to seek immediate medical care, and risk reduction measures to take. Thank you for allowing [place] to participate in this patient's care.  Clemon Chambers, RN, MSN, FNP-C [facility] Office: [phone]  Clinic MD: Scot Dock  11/21/2015  10:53 AM

## 2015-11-21 NOTE — Patient Instructions (Addendum)
Stroke Prevention Some medical conditions and behaviors are associated with an increased chance of having a stroke. You may prevent a stroke by making healthy choices and managing medical conditions. HOW CAN I REDUCE MY RISK OF HAVING A STROKE?   Stay physically active. Get at least 30 minutes of activity on most or all days.  Do not smoke. It may also be helpful to avoid exposure to secondhand smoke.  Limit alcohol use. Moderate alcohol use is considered to be:  No more than 2 drinks per day for men.  No more than 1 drink per day for nonpregnant women.  Eat healthy foods. This involves:  Eating 5 or more servings of fruits and vegetables a day.  Making dietary changes that address high blood pressure (hypertension), high cholesterol, diabetes, or obesity.  Manage your cholesterol levels.  Making food choices that are high in fiber and low in saturated fat, trans fat, and cholesterol may control cholesterol levels.  Take any prescribed medicines to control cholesterol as directed by your health care provider.  Manage your diabetes.  Controlling your carbohydrate and sugar intake is recommended to manage diabetes.  Take any prescribed medicines to control diabetes as directed by your health care provider.  Control your hypertension.  Making food choices that are low in salt (sodium), saturated fat, trans fat, and cholesterol is recommended to manage hypertension.  Ask your health care provider if you need treatment to lower your blood pressure. Take any prescribed medicines to control hypertension as directed by your health care provider.  If you are 18-39 years of age, have your blood pressure checked every 3-5 years. If you are 40 years of age or older, have your blood pressure checked every year.  Maintain a healthy weight.  Reducing calorie intake and making food choices that are low in sodium, saturated fat, trans fat, and cholesterol are recommended to manage  weight.  Stop drug abuse.  Avoid taking birth control pills.  Talk to your health care provider about the risks of taking birth control pills if you are over 35 years old, smoke, get migraines, or have ever had a blood clot.  Get evaluated for sleep disorders (sleep apnea).  Talk to your health care provider about getting a sleep evaluation if you snore a lot or have excessive sleepiness.  Take medicines only as directed by your health care provider.  For some people, aspirin or blood thinners (anticoagulants) are helpful in reducing the risk of forming abnormal blood clots that can lead to stroke. If you have the irregular heart rhythm of atrial fibrillation, you should be on a blood thinner unless there is a good reason you cannot take them.  Understand all your medicine instructions.  Make sure that other conditions (such as anemia or atherosclerosis) are addressed. SEEK IMMEDIATE MEDICAL CARE IF:   You have sudden weakness or numbness of the face, arm, or leg, especially on one side of the body.  Your face or eyelid droops to one side.  You have sudden confusion.  You have trouble speaking (aphasia) or understanding.  You have sudden trouble seeing in one or both eyes.  You have sudden trouble walking.  You have dizziness.  You have a loss of balance or coordination.  You have a sudden, severe headache with no known cause.  You have new chest pain or an irregular heartbeat. Any of these symptoms may represent a serious problem that is an emergency. Do not wait to see if the symptoms will   go away. Get medical help at once. Call your local emergency services (911 in [place] Do not drive yourself to the hospital.   This information is not intended to replace advice given to you by your health care provider. Make sure you discuss any questions you have with your health care provider.   Document Released: 01/15/2005 Document Revised: 12/29/2014 Document Reviewed:  06/10/2013 Elsevier Interactive Patient Education 2016 Elsevier Inc.    Peripheral Vascular Disease Peripheral vascular disease (PVD) is a disease of the blood vessels that are not part of your heart and brain. A simple term for PVD is poor circulation. In most cases, PVD narrows the blood vessels that carry blood from your heart to the rest of your body. This can result in a decreased supply of blood to your arms, legs, and internal organs, like your stomach or kidneys. However, it most often affects a person's lower legs and feet. There are two types of PVD.  Organic PVD. This is the more common type. It is caused by damage to the structure of blood vessels.  Functional PVD. This is caused by conditions that make blood vessels contract and tighten (spasm). Without treatment, PVD tends to get worse over time. PVD can also lead to acute ischemic limb. This is when an arm or limb suddenly has trouble getting enough blood. This is a medical emergency. CAUSES Each type of PVD has many different causes. The most common cause of PVD is buildup of a fatty material (plaque) inside of your arteries (atherosclerosis). Small amounts of plaque can break off from the walls of the blood vessels and become lodged in a smaller artery. This blocks blood flow and can cause acute ischemic limb. Other common causes of PVD include:  Blood clots that form inside of blood vessels.  Injuries to blood vessels.  Diseases that cause inflammation of blood vessels or cause blood vessel spasms.  Health behaviors and health history that increase your risk of developing PVD. RISK FACTORS  You may have a greater risk of PVD if you:  Have a family history of PVD.  Have certain medical conditions, including:  High cholesterol.  Diabetes.  High blood pressure (hypertension).  Coronary heart disease.  Past problems with blood clots.  Past injury, such as burns or a broken bone. These may have damaged blood  vessels in your limbs.  Buerger disease. This is caused by inflamed blood vessels in your hands and feet.  Some forms of arthritis.  Rare birth defects that affect the arteries in your legs.  Use tobacco.  Do not get enough exercise.  Are obese.  Are age 50 or older. SIGNS AND SYMPTOMS  PVD may cause many different symptoms. Your symptoms depend on what part of your body is not getting enough blood. Some common signs and symptoms include:  Cramps in your lower legs. This may be a symptom of poor leg circulation (claudication).  Pain and weakness in your legs while you are physically active that goes away when you rest (intermittent claudication).  Leg pain when at rest.  Leg numbness, tingling, or weakness.  Coldness in a leg or foot, especially when compared with the other leg.  Skin or hair changes. These can include:  Hair loss.  Shiny skin.  Pale or bluish skin.  Thick toenails.  Inability to get or maintain an erection (erectile dysfunction). People with PVD are more prone to developing ulcers and sores on their toes, feet, or legs. These may take longer than   normal to heal. DIAGNOSIS Your health care provider may diagnose PVD from your signs and symptoms. The health care provider will also do a physical exam. You may have tests to find out what is causing your PVD and determine its severity. Tests may include:  Blood pressure recordings from your arms and legs and measurements of the strength of your pulses (pulse volume recordings).  Imaging studies using sound waves to take pictures of the blood flow through your blood vessels (Doppler ultrasound).  Injecting a dye into your blood vessels before having imaging studies using:  X-rays (angiogram or arteriogram).  Computer-generated X-rays (CT angiogram).  A powerful electromagnetic field and a computer (magnetic resonance angiogram or MRA). TREATMENT Treatment for PVD depends on the cause of your condition  and the severity of your symptoms. It also depends on your age. Underlying causes need to be treated and controlled. These include long-lasting (chronic) conditions, such as diabetes, high cholesterol, and high blood pressure. You may need to first try making lifestyle changes and taking medicines. Surgery may be needed if these do not work. Lifestyle changes may include:  Quitting smoking.  Exercising regularly.  Following a low-fat, low-cholesterol diet. Medicines may include:  Blood thinners to prevent blood clots.  Medicines to improve blood flow.  Medicines to improve your blood cholesterol levels. Surgical procedures may include:  A procedure that uses an inflated balloon to open a blocked artery and improve blood flow (angioplasty).  A procedure to put in a tube (stent) to keep a blocked artery open (stent implant).  Surgery to reroute blood flow around a blocked artery (peripheral bypass surgery).  Surgery to remove dead tissue from an infected wound on the affected limb.  Amputation. This is surgical removal of the affected limb. This may be necessary in cases of acute ischemic limb that are not improved through medical or surgical treatments. HOME CARE INSTRUCTIONS  Take medicines only as directed by your health care provider.  Do not use any tobacco products, including cigarettes, chewing tobacco, or electronic cigarettes. If you need help quitting, ask your health care provider.  Lose weight if you are overweight, and maintain a healthy weight as directed by your health care provider.  Eat a diet that is low in fat and cholesterol. If you need help, ask your health care provider.  Exercise regularly. Ask your health care provider to suggest some good activities for you.  Use compression stockings or other mechanical devices as directed by your health care provider.  Take good care of your feet.  Wear comfortable shoes that fit well.  Check your feet often for  any cuts or sores. SEEK MEDICAL CARE IF:  You have cramps in your legs while walking.  You have leg pain when you are at rest.  You have coldness in a leg or foot.  Your skin changes.  You have erectile dysfunction.  You have cuts or sores on your feet that are not healing. SEEK IMMEDIATE MEDICAL CARE IF:  Your arm or leg turns cold and blue.  Your arms or legs become red, warm, swollen, painful, or numb.  You have chest pain or trouble breathing.  You suddenly have weakness in your face, arm, or leg.  You become very confused or lose the ability to speak.  You suddenly have a very bad headache or lose your vision.   This information is not intended to replace advice given to you by your health care provider. Make sure you discuss any questions   you have with your health care provider.   Document Released: 01/15/2005 Document Revised: 12/29/2014 Document Reviewed: 05/18/2014 Elsevier Interactive Patient Education 2016 Elsevier Inc.  

## 2016-01-03 ENCOUNTER — Ambulatory Visit (INDEPENDENT_AMBULATORY_CARE_PROVIDER_SITE_OTHER): Payer: Medicare Other | Admitting: Family Medicine

## 2016-01-03 ENCOUNTER — Ambulatory Visit (INDEPENDENT_AMBULATORY_CARE_PROVIDER_SITE_OTHER): Payer: Medicare Other

## 2016-01-03 VITALS — BP 146/70 | HR 81 | Temp 98.3°F | Resp 17 | Ht 63.5 in | Wt 146.0 lb

## 2016-01-03 DIAGNOSIS — M25551 Pain in right hip: Secondary | ICD-10-CM

## 2016-01-03 DIAGNOSIS — F32A Depression, unspecified: Secondary | ICD-10-CM

## 2016-01-03 DIAGNOSIS — F418 Other specified anxiety disorders: Secondary | ICD-10-CM | POA: Diagnosis not present

## 2016-01-03 DIAGNOSIS — F419 Anxiety disorder, unspecified: Secondary | ICD-10-CM

## 2016-01-03 DIAGNOSIS — I6523 Occlusion and stenosis of bilateral carotid arteries: Secondary | ICD-10-CM | POA: Diagnosis not present

## 2016-01-03 DIAGNOSIS — M545 Low back pain, unspecified: Secondary | ICD-10-CM

## 2016-01-03 DIAGNOSIS — F329 Major depressive disorder, single episode, unspecified: Secondary | ICD-10-CM

## 2016-01-03 DIAGNOSIS — E1159 Type 2 diabetes mellitus with other circulatory complications: Secondary | ICD-10-CM | POA: Diagnosis not present

## 2016-01-03 DIAGNOSIS — E119 Type 2 diabetes mellitus without complications: Secondary | ICD-10-CM

## 2016-01-03 DIAGNOSIS — J069 Acute upper respiratory infection, unspecified: Secondary | ICD-10-CM

## 2016-01-03 LAB — POCT CBC
Granulocyte percent: 57.7 % (ref 37–80)
HCT, POC: 41.6 % (ref 37.7–47.9)
Hemoglobin: 14.1 g/dL (ref 12.2–16.2)
Lymph, poc: 4.4 — AB (ref 0.6–3.4)
MCH, POC: 29.4 pg (ref 27–31.2)
MCHC: 33.9 g/dL (ref 31.8–35.4)
MCV: 86.6 fL (ref 80–97)
MID (cbc): 0.6 (ref 0–0.9)
MPV: 8.1 fL (ref 0–99.8)
POC Granulocyte: 6.8 (ref 2–6.9)
POC LYMPH PERCENT: 37.2 %L (ref 10–50)
POC MID %: 5.1 % (ref 0–12)
Platelet Count, POC: 319 10*3/uL (ref 142–424)
RBC: 4.8 M/uL (ref 4.04–5.48)
RDW, POC: 14.1 %
WBC: 11.8 10*3/uL — AB (ref 4.6–10.2)

## 2016-01-03 LAB — POCT GLYCOSYLATED HEMOGLOBIN (HGB A1C): Hemoglobin A1C: 6.4

## 2016-01-03 MED ORDER — ALPRAZOLAM 0.5 MG PO TABS: ORAL_TABLET | ORAL | Status: DC

## 2016-01-03 MED ORDER — VENLAFAXINE HCL ER 150 MG PO CP24: 150.0000 mg | ORAL_CAPSULE | Freq: Every day | ORAL | Status: DC

## 2016-01-03 MED ORDER — AMOXICILLIN 500 MG PO CAPS: 500.0000 mg | ORAL_CAPSULE | Freq: Two times a day (BID) | ORAL | Status: DC

## 2016-01-03 MED ORDER — METFORMIN HCL 1000 MG PO TABS: 1000.0000 mg | ORAL_TABLET | Freq: Two times a day (BID) | ORAL | Status: DC

## 2016-01-03 MED ORDER — CYCLOBENZAPRINE HCL 5 MG PO TABS: 5.0000 mg | ORAL_TABLET | Freq: Every evening | ORAL | Status: DC | PRN

## 2016-01-03 MED ORDER — HYDROCODONE-ACETAMINOPHEN 5-325 MG PO TABS: 1.0000 | ORAL_TABLET | Freq: Three times a day (TID) | ORAL | Status: DC | PRN

## 2016-01-03 NOTE — Progress Notes (Signed)
Chief Complaint:  Chief Complaint  Patient presents with  . Hip Pain    Rt hip onset 2 months off and on getting worse  . Medication Refill    Effexor, metformin and Xanax    HPI: Andrea [name] is a 50 y.o. female who reports to [facility] today complaining of   Right hip pain x 2 months, does not do it all the time, no difference in activities. NKI. + fever and chills , she has had sinus sxs as well. No clicking or popping. Constant ache and soreness in hip joint, when she steps it feels like it is catching in her hip. She screams and cries until it goes away after a few minutes. + Sever pain She feels the same when it goes from standing to sitting position, but worse when she goes to sit down.  She is weak and denies any numbenss or tingling. She deneis any incontinence  DM -doing well.  HTN-Doing well. No SEs CAD- doignw ell, tolerating vascualr checks.  She is doing well on her medications, no SES, compliant, no SI/HI/halluciantions. She needs refills   BP Readings from Last 3 Encounters:  01/03/16 146/70  11/21/15 138/75  08/13/15 168/84   Lab Results  Component Value Date   HGBA1C 6.4 01/03/2016   HGBA1C 6.3* 08/13/2015   HGBA1C 6.0 09/22/2014   Lab Results  Component Value Date   MICROALBUR 0.7 05/11/2015   LDLCALC 115 08/13/2015   CREATININE 0.59 01/03/2016   Wt Readings from Last 3 Encounters:  01/03/16 146 lb (66.225 kg)  11/21/15 148 lb (67.132 kg)  08/13/15 147 lb 9.6 oz (66.951 kg)     Past Medical History  Diagnosis Date  . Hypertension   . Anxiety     on Alprazolam  . Depression     on Effexor  . Diabetes mellitus without complication ([place])     [id number]  . Heart murmur     was seen by cardiologist in [facility] 2013  . Allergy   . Atrial fibrillation ([place])   . Peripheral vascular disease ([place])   . Carotid artery occlusion   . Stroke ([place])   . Anemia    Past Surgical History  Procedure Laterality Date  . Cholecystectomy    .  Cesarean section    . Tubal ligation    . Femoral bypass      x 2 on right side due to re-stenosis  . Left/right carotid artery      2003 and 2009 for right, right side is 100% occluded per patient; left side 2009  . 2 stents    . Carotid endarterectomy     Social History   Social History  . Marital Status: Married    Spouse Name: N/A  . Number of Children: 1  . Years of Education: College   Occupational History  . retired/disabled    Social History Main Topics  . Smoking status: Former Smoker    Quit date: 12/22/2010  . Smokeless tobacco: Never Used  . Alcohol Use: No     Comment: holidays/special occas 3-4 drinks  . Drug Use: No  . Sexual Activity: Yes   Other Topics Concern  . None   Social History Narrative   Patient lives at home with her husband and son   Patient is right handed   Patient drinks coffee daily   Family History  Problem Relation Age of Onset  . Diabetes Mother   . Heart  disease Mother   . Hyperlipidemia Mother   . Hypertension Mother   . Heart disease Father   . Hyperlipidemia Father   . Hypertension Father   . Diabetes Maternal Grandmother   . Heart disease Maternal Grandmother   . Hyperlipidemia Maternal Grandmother   . Cancer Paternal Grandmother   . Cancer Sister   . Varicose Veins Sister   . Heart attack Sister    Allergies  Allergen Reactions  . Other Hives    Contrast dye    Prior to Admission medications   Medication Sig Start Date End Date Taking? Authorizing Provider  ALPRAZolam (XANAX) 0.5 MG tablet TAKE ONE TABLET BY MOUTH THREE TIMES DAILY AS NEEDED FOR ANXIETY -  CAN  ONLY  REFILL  AFTER  EVERY  28  DAYS 11/08/15  Yes Zosia Lucchese P Cody Albus, DO  amLODipine (NORVASC) 10 MG tablet Take 1 tablet (10 mg total) by mouth daily. 12/29/14  Yes Wynne Rozak P Haileyann Staiger, DO  aspirin 325 MG tablet Take 325 mg by mouth daily.   Yes Historical Provider, MD  Blood Pressure Monitoring (BLOOD PRESSURE CUFF) MISC Use to check blood pressure. 09/06/14  Yes Chelle  Jeffery, PA-C  clopidogrel (PLAVIX) 75 MG tablet TAKE ONE TABLET BY MOUTH ONCE DAILY WITH BREAKFAST 08/16/15  Yes Gay Filler Copland, MD  glipiZIDE (GLUCOTROL XL) 10 MG 24 hr tablet Take 1 tablet (10 mg total) by mouth daily with breakfast. 12/29/14  Yes Lanelle Lindo P Preslynn Bier, DO  glucose blood test strip Test blood sugar once daily. 09/06/14  Yes Chelle Jeffery, PA-C  Lancets MISC Test blood sugar once daily. 09/06/14  Yes Chelle Jeffery, PA-C  metFORMIN (GLUCOPHAGE) 1000 MG tablet Take 1 tablet (1,000 mg total) by mouth 2 (two) times daily with a meal. PATIENT NEEDS OFFICE VISIT FOR ADDITIONAL REFILLS 11/14/15  Yes Gay Filler Copland, MD  simvastatin (ZOCOR) 20 MG tablet Take 1 tablet (20 mg total) by mouth at bedtime. 08/16/15  Yes Darreld Mclean, MD  venlafaxine XR (EFFEXOR-XR) 150 MG 24 hr capsule Take 1 capsule (150 mg total) by mouth daily with breakfast. 12/29/14  Yes Gretta Samons P Reace Breshears, DO     ROS: The patient denies fevers, chills, night sweats, unintentional weight loss, chest pain, palpitations, wheezing, dyspnea on exertion, nausea, vomiting, abdominal pain, dysuria, hematuria, melena  All other systems have been reviewed and were otherwise negative with the exception of those mentioned in the HPI and as above.    PHYSICAL EXAM: Filed Vitals:   01/03/16 1625  BP: 146/70  Pulse: 81  Temp: 98.3 F (36.8 C)  Resp: 17   Body mass index is 25.45 kg/(m^2).   General: Alert, no acute distress HEENT:  Normocephalic, atraumatic, oropharynx patent. EOMI, PERRLA Cardiovascular:  Regular rate and rhythm, no rubs murmurs or gallops.  No pedal edema Respiratory: Clear to auscultation bilaterally.  No wheezes, rales, or rhonchi.  No cyanosis, no use of accessory musculature Abdominal: No organomegaly, abdomen is soft and non-tender, positive bowel sounds. No masses. Skin: No rashes. Neurologic: Facial musculature symmetric. Psychiatric: Patient acts appropriately throughout our interaction. Lymphatic: No  cervical or submandibular lymphadenopathy Musculoskeletal: Gait slightly antalgic + paramsk tenderness  Full AROM 5/5 strength, 2/2 DTRs No saddle anesthesia Straight leg +/- Poor hip exam due to pain, she can do ER and IR but it hurts her , she has decrease ROM due to pain but once she is up she can do IR and ER of right hip and bear weight.  knee exam--normal  LABS: Results for orders placed or performed in visit on 01/03/16  POCT glycosylated hemoglobin (Hb A1C)  Result Value Ref Range   Hemoglobin A1C 6.4   POCT CBC  Result Value Ref Range   WBC 11.8 (A) 4.6 - 10.2 K/uL   Lymph, poc 4.4 (A) 0.6 - 3.4   POC LYMPH PERCENT 37.2 10 - 50 %L   MID (cbc) 0.6 0 - 0.9   POC MID % 5.1 0 - 12 %M   POC Granulocyte 6.8 2 - 6.9   Granulocyte percent 57.7 37 - 80 %G   RBC 4.80 4.04 - 5.48 M/uL   Hemoglobin 14.1 12.2 - 16.2 g/dL   HCT, POC 41.6 37.7 - 47.9 %   MCV 86.6 80 - 97 fL   MCH, POC 29.4 27 - 31.2 pg   MCHC 33.9 31.8 - 35.4 g/dL   RDW, POC 14.1 %   Platelet Count, POC 319 142 - 424 K/uL   MPV 8.1 0 - 99.8 fL     EKG/XRAY:   Primary read interpreted by [facility]. Marin Comment at [facility]. + arthritis, djd, neg for fx or dislocation + prior stent , atherosclerosis   ASSESSMENT/PLAN: Encounter Diagnoses  Name Primary?  . Right hip pain Yes  . Right-sided low back pain without sciatica   . Carotid stenosis, bilateral   . Controlled type 2 diabetes mellitus with other circulatory complication, without long-term current use of insulin ([place])   . Anxiety and depression   . Diabetes mellitus without complication ([place])   . Acute upper respiratory infection    Refer to ortho since ongoing chronic issue x 2 months Refilled medications that she is out of FU 3 months for fasting lipids, LAbs pending Rx amoxacillin for URI sxs and sinus sxs for luekocytois could be reactive due to arthritic pain, bacterial vs viral infection. Cont to monior Gross sideeffects, risk and benefits, and  alternatives of medications d/w patient. Patient is aware that all medications have potential sideeffects and we are unable to predict every sideeffect or drug-drug interaction that may occur.  Denard Tuminello DO  01/03/2016 6:56 PM

## 2016-01-04 LAB — COMPLETE METABOLIC PANEL WITHOUT GFR
AST: 13 U/L (ref 10–35)
Albumin: 4.3 g/dL (ref 3.6–5.1)
BUN: 9 mg/dL (ref 7–25)
Chloride: 103 mmol/L (ref 98–110)
GFR, Est Non African American: >89 mL/min (ref >=60)
Glucose, Bld: 103 mg/dL — ABNORMAL HIGH (ref 65–99)
Potassium: 4.5 mmol/L (ref 3.5–5.3)
Sodium: 140 mmol/L (ref 135–146)

## 2016-01-04 LAB — COMPLETE METABOLIC PANEL WITH GFR
ALT: 9 U/L (ref 6–29)
Alkaline Phosphatase: 76 U/L (ref 33–130)
CO2: 30 mmol/L (ref 20–31)
Calcium: 10.1 mg/dL (ref 8.6–10.4)
Creat: 0.59 mg/dL (ref 0.50–1.05)
GFR, Est African American: >89 mL/min (ref >=60)
Total Bilirubin: 0.2 mg/dL (ref 0.2–1.2)
Total Protein: 7 g/dL (ref 6.1–8.1)

## 2016-05-07 ENCOUNTER — Encounter: Payer: Self-pay | Admitting: Family

## 2016-05-13 ENCOUNTER — Other Ambulatory Visit: Payer: Self-pay | Admitting: *Deleted

## 2016-05-13 DIAGNOSIS — I6523 Occlusion and stenosis of bilateral carotid arteries: Secondary | ICD-10-CM

## 2016-05-14 ENCOUNTER — Ambulatory Visit (INDEPENDENT_AMBULATORY_CARE_PROVIDER_SITE_OTHER): Payer: Medicare Other | Admitting: Family

## 2016-05-14 ENCOUNTER — Encounter: Payer: Self-pay | Admitting: Family

## 2016-05-14 ENCOUNTER — Ambulatory Visit (HOSPITAL_COMMUNITY)
Admission: RE | Admit: 2016-05-14 | Discharge: 2016-05-14 | Disposition: A | Discharge status: Home / Self Care | Payer: Medicare Other | Source: Ambulatory Visit | Attending: Vascular Surgery | Admitting: Vascular Surgery

## 2016-05-14 ENCOUNTER — Ambulatory Visit: Payer: Medicare PPO | Admitting: Family

## 2016-05-14 ENCOUNTER — Encounter (HOSPITAL_COMMUNITY): Payer: Medicare PPO

## 2016-05-14 ENCOUNTER — Ambulatory Visit (INDEPENDENT_AMBULATORY_CARE_PROVIDER_SITE_OTHER)
Admission: RE | Admit: 2016-05-14 | Discharge: 2016-05-14 | Disposition: A | Discharge status: Home / Self Care | Payer: Medicare Other | Source: Ambulatory Visit | Attending: Family | Admitting: Family

## 2016-05-14 VITALS — BP 152/73 | HR 78 | Temp 97.5°F | Resp 14 | Ht 64.0 in | Wt 144.0 lb

## 2016-05-14 DIAGNOSIS — Z720 Tobacco use: Secondary | ICD-10-CM | POA: Diagnosis not present

## 2016-05-14 DIAGNOSIS — E119 Type 2 diabetes mellitus without complications: Secondary | ICD-10-CM | POA: Insufficient documentation

## 2016-05-14 DIAGNOSIS — I739 Peripheral vascular disease, unspecified: Secondary | ICD-10-CM | POA: Insufficient documentation

## 2016-05-14 DIAGNOSIS — I6522 Occlusion and stenosis of left carotid artery: Secondary | ICD-10-CM

## 2016-05-14 DIAGNOSIS — Z9889 Other specified postprocedural states: Secondary | ICD-10-CM

## 2016-05-14 DIAGNOSIS — Z48812 Encounter for surgical aftercare following surgery on the circulatory system: Secondary | ICD-10-CM

## 2016-05-14 DIAGNOSIS — I6521 Occlusion and stenosis of right carotid artery: Secondary | ICD-10-CM | POA: Diagnosis not present

## 2016-05-14 DIAGNOSIS — I10 Essential (primary) hypertension: Secondary | ICD-10-CM | POA: Insufficient documentation

## 2016-05-14 DIAGNOSIS — I779 Disorder of arteries and arterioles, unspecified: Secondary | ICD-10-CM | POA: Diagnosis not present

## 2016-05-14 DIAGNOSIS — I6523 Occlusion and stenosis of bilateral carotid arteries: Secondary | ICD-10-CM

## 2016-05-14 DIAGNOSIS — F17200 Nicotine dependence, unspecified, uncomplicated: Secondary | ICD-10-CM

## 2016-05-14 NOTE — Progress Notes (Signed)
Filed Vitals:   05/14/16 1142 05/14/16 1143 05/14/16 1144  BP: 178/82 133/81 152/73  Pulse: 78 78 78  Temp: 97.5 F (36.4 C)    Resp: 14    Height: 5\' 4"  (1.626 m)    Weight: 144 lb (65.318 kg)    SpO2: 100%

## 2016-05-14 NOTE — Patient Instructions (Signed)
Stroke Prevention Some medical conditions and behaviors are associated with an increased chance of having a stroke. You may prevent a stroke by making healthy choices and managing medical conditions. HOW CAN I REDUCE MY RISK OF HAVING A STROKE?   Stay physically active. Get at least 30 minutes of activity on most or all days.  Do not smoke. It may also be helpful to avoid exposure to secondhand smoke.  Limit alcohol use. Moderate alcohol use is considered to be:  No more than 2 drinks per day for men.  No more than 1 drink per day for nonpregnant women.  Eat healthy foods. This involves:  Eating 5 or more servings of fruits and vegetables a day.  Making dietary changes that address high blood pressure (hypertension), high cholesterol, diabetes, or obesity.  Manage your cholesterol levels.  Making food choices that are high in fiber and low in saturated fat, trans fat, and cholesterol may control cholesterol levels.  Take any prescribed medicines to control cholesterol as directed by your health care provider.  Manage your diabetes.  Controlling your carbohydrate and sugar intake is recommended to manage diabetes.  Take any prescribed medicines to control diabetes as directed by your health care provider.  Control your hypertension.  Making food choices that are low in salt (sodium), saturated fat, trans fat, and cholesterol is recommended to manage hypertension.  Ask your health care provider if you need treatment to lower your blood pressure. Take any prescribed medicines to control hypertension as directed by your health care provider.  If you are 18-39 years of age, have your blood pressure checked every 3-5 years. If you are 40 years of age or older, have your blood pressure checked every year.  Maintain a healthy weight.  Reducing calorie intake and making food choices that are low in sodium, saturated fat, trans fat, and cholesterol are recommended to manage  weight.  Stop drug abuse.  Avoid taking birth control pills.  Talk to your health care provider about the risks of taking birth control pills if you are over 35 years old, smoke, get migraines, or have ever had a blood clot.  Get evaluated for sleep disorders (sleep apnea).  Talk to your health care provider about getting a sleep evaluation if you snore a lot or have excessive sleepiness.  Take medicines only as directed by your health care provider.  For some people, aspirin or blood thinners (anticoagulants) are helpful in reducing the risk of forming abnormal blood clots that can lead to stroke. If you have the irregular heart rhythm of atrial fibrillation, you should be on a blood thinner unless there is a good reason you cannot take them.  Understand all your medicine instructions.  Make sure that other conditions (such as anemia or atherosclerosis) are addressed. SEEK IMMEDIATE MEDICAL CARE IF:   You have sudden weakness or numbness of the face, arm, or leg, especially on one side of the body.  Your face or eyelid droops to one side.  You have sudden confusion.  You have trouble speaking (aphasia) or understanding.  You have sudden trouble seeing in one or both eyes.  You have sudden trouble walking.  You have dizziness.  You have a loss of balance or coordination.  You have a sudden, severe headache with no known cause.  You have new chest pain or an irregular heartbeat. Any of these symptoms may represent a serious problem that is an emergency. Do not wait to see if the symptoms will   go away. Get medical help at once. Call your local emergency services (911 in [place] Do not drive yourself to the hospital.   This information is not intended to replace advice given to you by your health care provider. Make sure you discuss any questions you have with your health care provider.   Document Released: 01/15/2005 Document Revised: 12/29/2014 Document Reviewed:  06/10/2013 Elsevier Interactive Patient Education 2016 [employer]    Peripheral Vascular Disease Peripheral vascular disease (PVD) is a disease of the blood vessels that are not part of your heart and brain. A simple term for PVD is poor circulation. In most cases, PVD narrows the blood vessels that carry blood from your heart to the rest of your body. This can result in a decreased supply of blood to your arms, legs, and internal organs, like your stomach or kidneys. However, it most often affects a person's lower legs and feet. There are two types of PVD.  Organic PVD. This is the more common type. It is caused by damage to the structure of blood vessels.  Functional PVD. This is caused by conditions that make blood vessels contract and tighten (spasm). Without treatment, PVD tends to get worse over time. PVD can also lead to acute ischemic limb. This is when an arm or limb suddenly has trouble getting enough blood. This is a medical emergency. CAUSES Each type of PVD has many different causes. The most common cause of PVD is buildup of a fatty material (plaque) inside of your arteries (atherosclerosis). Small amounts of plaque can break off from the walls of the blood vessels and become lodged in a smaller artery. This blocks blood flow and can cause acute ischemic limb. Other common causes of PVD include:  Blood clots that form inside of blood vessels.  Injuries to blood vessels.  Diseases that cause inflammation of blood vessels or cause blood vessel spasms.  Health behaviors and health history that increase your risk of developing PVD. RISK FACTORS  You may have a greater risk of PVD if you:  Have a family history of PVD.  Have certain medical conditions, including:  High cholesterol.  Diabetes.  High blood pressure (hypertension).  Coronary heart disease.  Past problems with blood clots.  Past injury, such as burns or a broken bone. These may have damaged blood  vessels in your limbs.  Buerger disease. This is caused by inflamed blood vessels in your hands and feet.  Some forms of arthritis.  Rare birth defects that affect the arteries in your legs.  Use tobacco.  Do not get enough exercise.  Are obese.  Are age 50 or older. SIGNS AND SYMPTOMS  PVD may cause many different symptoms. Your symptoms depend on what part of your body is not getting enough blood. Some common signs and symptoms include:  Cramps in your lower legs. This may be a symptom of poor leg circulation (claudication).  Pain and weakness in your legs while you are physically active that goes away when you rest (intermittent claudication).  Leg pain when at rest.  Leg numbness, tingling, or weakness.  Coldness in a leg or foot, especially when compared with the other leg.  Skin or hair changes. These can include:  Hair loss.  Shiny skin.  Pale or bluish skin.  Thick toenails.  Inability to get or maintain an erection (erectile dysfunction). People with PVD are more prone to developing ulcers and sores on their toes, feet, or legs. These may take longer than   normal to heal. DIAGNOSIS Your health care provider may diagnose PVD from your signs and symptoms. The health care provider will also do a physical exam. You may have tests to find out what is causing your PVD and determine its severity. Tests may include:  Blood pressure recordings from your arms and legs and measurements of the strength of your pulses (pulse volume recordings).  Imaging studies using sound waves to take pictures of the blood flow through your blood vessels (Doppler ultrasound).  Injecting a dye into your blood vessels before having imaging studies using:  X-rays (angiogram or arteriogram).  Computer-generated X-rays (CT angiogram).  A powerful electromagnetic field and a computer (magnetic resonance angiogram or MRA). TREATMENT Treatment for PVD depends on the cause of your condition  and the severity of your symptoms. It also depends on your age. Underlying causes need to be treated and controlled. These include long-lasting (chronic) conditions, such as diabetes, high cholesterol, and high blood pressure. You may need to first try making lifestyle changes and taking medicines. Surgery may be needed if these do not work. Lifestyle changes may include:  Quitting smoking.  Exercising regularly.  Following a low-fat, low-cholesterol diet. Medicines may include:  Blood thinners to prevent blood clots.  Medicines to improve blood flow.  Medicines to improve your blood cholesterol levels. Surgical procedures may include:  A procedure that uses an inflated balloon to open a blocked artery and improve blood flow (angioplasty).  A procedure to put in a tube (stent) to keep a blocked artery open (stent implant).  Surgery to reroute blood flow around a blocked artery (peripheral bypass surgery).  Surgery to remove dead tissue from an infected wound on the affected limb.  Amputation. This is surgical removal of the affected limb. This may be necessary in cases of acute ischemic limb that are not improved through medical or surgical treatments. HOME CARE INSTRUCTIONS  Take medicines only as directed by your health care provider.  Do not use any tobacco products, including cigarettes, chewing tobacco, or electronic cigarettes. If you need help quitting, ask your health care provider.  Lose weight if you are overweight, and maintain a healthy weight as directed by your health care provider.  Eat a diet that is low in fat and cholesterol. If you need help, ask your health care provider.  Exercise regularly. Ask your health care provider to suggest some good activities for you.  Use compression stockings or other mechanical devices as directed by your health care provider.  Take good care of your feet.  Wear comfortable shoes that fit well.  Check your feet often for  any cuts or sores. SEEK MEDICAL CARE IF:  You have cramps in your legs while walking.  You have leg pain when you are at rest.  You have coldness in a leg or foot.  Your skin changes.  You have erectile dysfunction.  You have cuts or sores on your feet that are not healing. SEEK IMMEDIATE MEDICAL CARE IF:  Your arm or leg turns cold and blue.  Your arms or legs become red, warm, swollen, painful, or numb.  You have chest pain or trouble breathing.  You suddenly have weakness in your face, arm, or leg.  You become very confused or lose the ability to speak.  You suddenly have a very bad headache or lose your vision.   This information is not intended to replace advice given to you by your health care provider. Make sure you discuss any questions   you have with your health care provider.   Document Released: 01/15/2005 Document Revised: 12/29/2014 Document Reviewed: 05/18/2014 Elsevier Interactive Patient Education 2016 [employer]     Steps to Quit Smoking  Smoking tobacco can be harmful to your health and can affect almost every organ in your body. Smoking puts you, and those around you, at risk for developing many serious chronic diseases. Quitting smoking is difficult, but it is one of the best things that you can do for your health. It is never too late to quit. WHAT ARE THE BENEFITS OF QUITTING SMOKING? When you quit smoking, you lower your risk of developing serious diseases and conditions, such as:  Lung cancer or lung disease, such as COPD.  Heart disease.  Stroke.  Heart attack.  Infertility.  Osteoporosis and bone fractures. Additionally, symptoms such as coughing, wheezing, and shortness of breath may get better when you quit. You may also find that you get sick less often because your body is stronger at fighting off colds and infections. If you are pregnant, quitting smoking can help to reduce your chances of having a baby of low birth weight. HOW DO  I GET READY TO QUIT? When you decide to quit smoking, create a plan to make sure that you are successful. Before you quit:  Pick a date to quit. Set a date within the next two weeks to give you time to prepare.  Write down the reasons why you are quitting. Keep this list in places where you will see it often, such as on your bathroom mirror or in your car or wallet.  Identify the people, places, things, and activities that make you want to smoke (triggers) and avoid them. Make sure to take these actions:  Throw away all cigarettes at home, at work, and in your car.  Throw away smoking accessories, such as ashtrays and lighters.  Clean your car and make sure to empty the ashtray.  Clean your home, including curtains and carpets.  Tell your family, friends, and coworkers that you are quitting. Support from your loved ones can make quitting easier.  Talk with your health care provider about your options for quitting smoking.  Find out what treatment options are covered by your health insurance. WHAT STRATEGIES CAN I USE TO QUIT SMOKING?  Talk with your healthcare provider about different strategies to quit smoking. Some strategies include:  Quitting smoking altogether instead of gradually lessening how much you smoke over a period of time. Research shows that quitting "cold turkey" is more successful than gradually quitting.  Attending in-person counseling to help you build problem-solving skills. You are more likely to have success in quitting if you attend several counseling sessions. Even short sessions of 10 minutes can be effective.  Finding resources and support systems that can help you to quit smoking and remain smoke-free after you quit. These resources are most helpful when you use them often. They can include:  Online chats with a counselor.  Telephone quitlines.  Printed self-help materials.  Support groups or group counseling.  Text messaging programs.  Mobile phone  applications.  Taking medicines to help you quit smoking. (If you are pregnant or breastfeeding, talk with your health care provider first.) Some medicines contain nicotine and some do not. Both types of medicines help with cravings, but the medicines that include nicotine help to relieve withdrawal symptoms. Your health care provider may recommend:  Nicotine patches, gum, or lozenges.  Nicotine inhalers or sprays.  Non-nicotine medicine that   is taken by mouth. Talk with your health care provider about combining strategies, such as taking medicines while you are also receiving in-person counseling. Using these two strategies together makes you more likely to succeed in quitting than if you used either strategy on its own. If you are pregnant or breastfeeding, talk with your health care provider about finding counseling or other support strategies to quit smoking. Do not take medicine to help you quit smoking unless told to do so by your health care provider. WHAT THINGS CAN I DO TO MAKE IT EASIER TO QUIT? Quitting smoking might feel overwhelming at first, but there is a lot that you can do to make it easier. Take these important actions:  Reach out to your family and friends and ask that they support and encourage you during this time. Call telephone quitlines, reach out to support groups, or work with a counselor for support.  Ask people who smoke to avoid smoking around you.  Avoid places that trigger you to smoke, such as bars, parties, or smoke-break areas at work.  Spend time around people who do not smoke.  Lessen stress in your life, because stress can be a smoking trigger for some people. To lessen stress, try:  Exercising regularly.  Deep-breathing exercises.  Yoga.  Meditating.  Performing a body scan. This involves closing your eyes, scanning your body from head to toe, and noticing which parts of your body are particularly tense. Purposefully relax the muscles in those  areas.  Download or purchase mobile phone or tablet apps (applications) that can help you stick to your quit plan by providing reminders, tips, and encouragement. There are many free apps, such as QuitGuide from the CDC (Centers for Disease Control and Prevention). You can find other support for quitting smoking (smoking cessation) through smokefree.gov and other websites. HOW WILL I FEEL WHEN I QUIT SMOKING? Within the first 24 hours of quitting smoking, you may start to feel some withdrawal symptoms. These symptoms are usually most noticeable 2-3 days after quitting, but they usually do not last beyond 2-3 weeks. Changes or symptoms that you might experience include:  Mood swings.  Restlessness, anxiety, or irritation.  Difficulty concentrating.  Dizziness.  Strong cravings for sugary foods in addition to nicotine.  Mild weight gain.  Constipation.  Nausea.  Coughing or a sore throat.  Changes in how your medicines work in your body.  A depressed mood.  Difficulty sleeping (insomnia). After the first 2-3 weeks of quitting, you may start to notice more positive results, such as:  Improved sense of smell and taste.  Decreased coughing and sore throat.  Slower heart rate.  Lower blood pressure.  Clearer skin.  The ability to breathe more easily.  Fewer sick days. Quitting smoking is very challenging for most people. Do not get discouraged if you are not successful the first time. Some people need to make many attempts to quit before they achieve long-term success. Do your best to stick to your quit plan, and talk with your health care provider if you have any questions or concerns.   This information is not intended to replace advice given to you by your health care provider. Make sure you discuss any questions you have with your health care provider.   Document Released: 12/02/2001 Document Revised: 04/24/2015 Document Reviewed: 04/24/2015 Elsevier Interactive Patient  Education 2016 [employer]  

## 2016-05-14 NOTE — Progress Notes (Signed)
[facility] [place]   CC: Follow up peripheral artery occlusive disease  History of Present Illness Andrea [name] is a 50 y.o. female patient of Dr. Scot Dock who has undergone previous bilateral carotid endarterectomies by Dr. Drucie Opitz and also a femorofemoral bypass graft by Dr. Amedeo Plenty. She had described some left-sided weakness in July 2015 which was felt to be related to hypotension. She has a known right internal carotid artery occlusion. She had mild disease on the left which is actually improved at the time of her last follow up study.  She denies any subsequent history of stroke, TIAs, expressive or receptive aphasia, or amaurosis fugax. She denies significant claudication or rest pain.  She is on aspirin, Plavix, and a statin. (She is on simvastatin).  She has right hip joint pain that is worse with crossing her legs, is not exacerbated by walking; she has had injections in her right hip to help the pain.  She denies any history of MI or cardiac stents, states she does have heart murmur.   Pt Diabetic: Yes, A1C in January 2017 was 6.4 (review of records) Pt smoker: current smoker, quit in 2012, smoked for about 30 years, resumed in December 2016  Pt meds include: Statin :Yes Betablocker: No ASA: Yes Other anticoagulants/antiplatelets: Plavix    Past Medical History  Diagnosis Date  . Hypertension   . Anxiety     on Alprazolam  . Depression     on Effexor  . Diabetes mellitus without complication ([place])     [id number]  . Heart murmur     was seen by cardiologist in [facility] 2013  . Allergy   . Atrial fibrillation ([place])   . Peripheral vascular disease ([place])   . Carotid artery occlusion   . Stroke ([place])   . Anemia     Social History Social History  Substance Use Topics  . Smoking status: Former Smoker    Quit date: 12/22/2010  . Smokeless tobacco: Never Used  . Alcohol Use: No     Comment: holidays/special occas 3-4 drinks    Family  History Family History  Problem Relation Age of Onset  . Diabetes Mother   . Heart disease Mother   . Hyperlipidemia Mother   . Hypertension Mother   . Heart disease Father   . Hyperlipidemia Father   . Hypertension Father   . Diabetes Maternal Grandmother   . Heart disease Maternal Grandmother   . Hyperlipidemia Maternal Grandmother   . Cancer Paternal Grandmother   . Cancer Sister   . Varicose Veins Sister   . Heart attack Sister     Past Surgical History  Procedure Laterality Date  . Cholecystectomy    . Cesarean section    . Tubal ligation    . Femoral bypass      x 2 on right side due to re-stenosis  . Left/right carotid artery      2003 and 2009 for right, right side is 100% occluded per patient; left side 2009  . 2 stents    . Carotid endarterectomy      Allergies  Allergen Reactions  . Other Hives    Contrast dye     Current Outpatient Prescriptions  Medication Sig Dispense Refill  . ALPRAZolam (XANAX) 0.5 MG tablet TAKE ONE TABLET BY MOUTH THREE TIMES DAILY AS NEEDED FOR ANXIETY 90 tablet 0  . amLODipine (NORVASC) 10 MG tablet Take 1 tablet (10 mg total) by mouth daily. 90 tablet  3  . aspirin 325 MG tablet Take 325 mg by mouth daily.    . Blood Pressure Monitoring (BLOOD PRESSURE CUFF) MISC Use to check blood pressure. 1 each 0  . clopidogrel (PLAVIX) 75 MG tablet TAKE ONE TABLET BY MOUTH ONCE DAILY WITH BREAKFAST 90 tablet 3  . cyclobenzaprine (FLEXERIL) 5 MG tablet Take 1 tablet (5 mg total) by mouth at bedtime as needed. 30 tablet 0  . glipiZIDE (GLUCOTROL XL) 10 MG 24 hr tablet Take 1 tablet (10 mg total) by mouth daily with breakfast. 90 tablet 3  . glucose blood test strip Test blood sugar once daily. 100 each 2  . HYDROcodone-acetaminophen (NORCO) 5-325 MG tablet Take 1 tablet by mouth every 8 (eight) hours as needed for moderate pain. Take with stool softener, no extra tylenol with this. 30 tablet 0  . Lancets MISC Test blood sugar once daily. 100  each 2  . metFORMIN (GLUCOPHAGE) 1000 MG tablet Take 1 tablet (1,000 mg total) by mouth 2 (two) times daily with a meal. PATIENT NEEDS OFFICE VISIT FOR ADDITIONAL REFILLS 60 tablet 0  . simvastatin (ZOCOR) 20 MG tablet Take 1 tablet (20 mg total) by mouth at bedtime. 90 tablet 3  . venlafaxine XR (EFFEXOR-XR) 150 MG 24 hr capsule Take 1 capsule (150 mg total) by mouth daily with breakfast. 90 capsule 3  . amoxicillin (AMOXIL) 500 MG capsule Take 1 capsule (500 mg total) by mouth 2 (two) times daily. (Patient not taking: Reported on 05/14/2016) 20 capsule 0   No current facility-administered medications for this visit.    ROS: See HPI for pertinent positives and negatives.   Physical Examination  Filed Vitals:   05/14/16 1142 05/14/16 1143 05/14/16 1144  BP: 178/82 133/81 152/73  Pulse: 78 78 78  Temp: 97.5 F (36.4 C)    Resp: 14    Height: 5\' 4"  (1.626 m)    Weight: 144 lb (65.318 kg)    SpO2: 100%     Body mass index is 24.71 kg/(m^2).  General: WDWN in NAD Gait: Normal HENT: WNL Eyes: Pupils equal Pulmonary: normal non-labored breathing, no rales, rhonchi, or wheezing Cardiac: RRR, no murmur detected  Abdomen: soft, NT, no masses palpated Skin: no rashes, no ulcers, no cellulitis.   VASCULAR EXAM  Carotid Bruits Right Left   Positive Positive   Aorta is mildly palpable Femoral to femoral graft bypass is palpable   VASCULAR EXAM: Extremities without ischemic changes, without Gangrene; without open wounds.     LE Pulses Right Left   FEMORAL 2+ palpable not palpable    POPLITEAL not palpable  not palpable   POSTERIOR TIBIAL 1+ palpable  2+ palpable    DORSALIS PEDIS  ANTERIOR TIBIAL 2+ palpable  2+ palpable     Musculoskeletal: no muscle  wasting or atrophy; no peripheral edema Neurologic: A&O X 3; Appropriate Affect ;  SENSATION: normal; MOTOR FUNCTION: 5/5 Symmetric, CN 2-12 intact Speech is fluent/normal          Non-Invasive Vascular Imaging: DATE: 05/14/2016  CEREBROVASCULAR DUPLEX EVALUATION    INDICATION: Carotid artery disease    PREVIOUS INTERVENTION(S): Right carotid endarterectomy 10/2002 with subsequent ICA and CCA occlusion. Left carotid endarterectomy 03/2008    DUPLEX EXAM: Carotid duplex    RIGHT  LEFT  Peak Systolic Velocities (cm/s) End Diastolic Velocities (cm/s) Plaque LOCATION Peak Systolic Velocities (cm/s) End Diastolic Velocities (cm/s) Plaque  0  HT CCA PROXIMAL 110 25   0  HT CCA MID 89  24   0  HT CCA DISTAL 150 22 HT  56 8  ECA 318 58 HT  0  HT ICA PROXIMAL 401 77 HT  0  HT ICA MID 108 34 HT  0  HT ICA DISTAL 90 34     NA ICA / CCA Ratio (PSV) NA  Antegrade Vertebral Flow Antegrade  - Brachial Systolic Pressure (mmHg) -  Triphasic Brachial Artery Waveforms Triphasic    Plaque Morphology:  HM = Homogeneous, HT = Heterogeneous, CP = Calcific Plaque, SP = Smooth Plaque, IP = Irregular Plaque     ADDITIONAL FINDINGS: Multiphasic subclavian arteries    IMPRESSION: 1. Known occlusion of the right internal carotid common carotid artery 2. Right external carotid artery stenosis fed by branch 3. Patent left carotid endarterectomy site with restenosis of the surgical site with velocity in the 50 - 69% range    Compared to the previous exam:  No change since exam of 05/09/2015     ABI:  RIGHT: 0.98 (1.0, 05/09/15), Waveforms: tri and biphasic, TBI: 0.81;  LEFT: 0.92 (1.0), Waveforms: triphasic, TBI: 0.73    ASSESSMENT: Andrea [name] is a 50 y.o. female who has undergone previous bilateral carotid endarterectomies by Dr. Drucie Opitz and also a femorofemoral bypass graft by Dr. Amedeo Plenty. She had described some left-sided weakness in July 2015 which was felt to be related to  hypotension. She has a known right internal carotid artery occlusion. She has moderate disease on the left ICA.  She denies any subsequent history of stroke, TIAs, expressive or receptive aphasia, or amaurosis fugax. She denies claudication sx's with walking; she walks 1-4 miles daily.   She had normal ABI's in May of 2016. Today's ABI's: right remains normal with tri and biphasic waveforms, left decreased to 92% from 100%, waveforms remain triphasic. Both TBI's are normal.  Pedal pulses are palpable.  Today's carotid duplex suggests known occlusion of the right internal carotid common carotid artery. Right external carotid artery stenosis fed by branch. Left carotid endarterectomy site with restenosis of the surgical site at 50 - 69%.    No change since exam of 05/09/2015.  Fortunately her DM remains in good control, but unfortunately she resumed smoking 6 months ago.  The patient was counseled re smoking cessation and given several free resources re smoking cessation.   PLAN:  Based on the patient's vascular studies and examination, pt will return to clinic in 6 months with carotid duplex, ABI's in a year.  I discussed in depth with the patient the nature of atherosclerosis, and emphasized the importance of maximal medical management including strict control of blood pressure, blood glucose, and lipid levels, obtaining regular exercise, and cessation of smoking.  The patient is aware that without maximal medical management the underlying atherosclerotic disease process will progress, limiting the benefit of any interventions.  The patient was given information about PAD including signs, symptoms, treatment, what symptoms should prompt the patient to seek immediate medical care, and risk reduction measures to take.  Clemon Chambers, RN, MSN, FNP-C [facility] [employer] Phone: [phone]  Clinic MD: Scot Dock  05/14/2016 12:11 PM

## 2016-06-07 ENCOUNTER — Ambulatory Visit (INDEPENDENT_AMBULATORY_CARE_PROVIDER_SITE_OTHER): Payer: Medicare Other | Admitting: Family Medicine

## 2016-06-07 VITALS — BP 130/72 | HR 88 | Temp 99.0°F | Resp 16 | Ht 64.0 in | Wt 147.0 lb

## 2016-06-07 DIAGNOSIS — E118 Type 2 diabetes mellitus with unspecified complications: Secondary | ICD-10-CM | POA: Diagnosis not present

## 2016-06-07 DIAGNOSIS — B029 Zoster without complications: Secondary | ICD-10-CM

## 2016-06-07 DIAGNOSIS — E119 Type 2 diabetes mellitus without complications: Secondary | ICD-10-CM

## 2016-06-07 HISTORY — DX: Zoster without complications: B02.9

## 2016-06-07 MED ORDER — VALACYCLOVIR HCL 1 G PO TABS: 1000.0000 mg | ORAL_TABLET | Freq: Three times a day (TID) | ORAL | Status: DC

## 2016-06-07 MED ORDER — METFORMIN HCL 1000 MG PO TABS: 1000.0000 mg | ORAL_TABLET | Freq: Two times a day (BID) | ORAL | Status: DC

## 2016-06-07 NOTE — Progress Notes (Signed)
Andrea [name] is a 50 y.o. female who presents to [facility] today for pain and a rash. Patient developed significant pain in the right neck extending to the right face and scalp about 9 days ago shortly followed by a rash. No fevers or chills nausea vomiting or diarrhea. She has used some Tylenol which helps a bit.  Of note she has run out of metformin and plans to make a follow-up appointment soon for her recheck.   Past Medical History  Diagnosis Date  . Hypertension   . Anxiety     on Alprazolam  . Depression     on Effexor  . Diabetes mellitus without complication ([place])     [id number]  . Heart murmur     was seen by cardiologist in [facility] 2013  . Allergy   . Atrial fibrillation ([place])   . Peripheral vascular disease ([place])   . Carotid artery occlusion   . Stroke ([place])   . Anemia    Past Surgical History  Procedure Laterality Date  . Cholecystectomy    . Cesarean section    . Tubal ligation    . Femoral bypass      x 2 on right side due to re-stenosis  . Left/right carotid artery      2003 and 2009 for right, right side is 100% occluded per patient; left side 2009  . 2 stents    . Carotid endarterectomy     Social History  Substance Use Topics  . Smoking status: Former Smoker    Quit date: 12/22/2010  . Smokeless tobacco: Never Used  . Alcohol Use: No     Comment: holidays/special occas 3-4 drinks   ROS as above Medications: Current Outpatient Prescriptions  Medication Sig Dispense Refill  . ALPRAZolam (XANAX) 0.5 MG tablet TAKE ONE TABLET BY MOUTH THREE TIMES DAILY AS NEEDED FOR ANXIETY 90 tablet 0  . amLODipine (NORVASC) 10 MG tablet Take 1 tablet (10 mg total) by mouth daily. 90 tablet 3  . aspirin 325 MG tablet Take 325 mg by mouth daily.    . Blood Pressure Monitoring (BLOOD PRESSURE CUFF) MISC Use to check blood pressure. 1 each 0  . clopidogrel (PLAVIX) 75 MG tablet TAKE ONE TABLET BY MOUTH ONCE DAILY WITH BREAKFAST 90 tablet 3  . cyclobenzaprine  (FLEXERIL) 5 MG tablet Take 1 tablet (5 mg total) by mouth at bedtime as needed. 30 tablet 0  . glipiZIDE (GLUCOTROL XL) 10 MG 24 hr tablet Take 1 tablet (10 mg total) by mouth daily with breakfast. 90 tablet 3  . glucose blood test strip Test blood sugar once daily. 100 each 2  . HYDROcodone-acetaminophen (NORCO) 5-325 MG tablet Take 1 tablet by mouth every 8 (eight) hours as needed for moderate pain. Take with stool softener, no extra tylenol with this. (Patient not taking: Reported on 06/07/2016) 30 tablet 0  . Lancets MISC Test blood sugar once daily. 100 each 2  . metFORMIN (GLUCOPHAGE) 1000 MG tablet Take 1 tablet (1,000 mg total) by mouth 2 (two) times daily with a meal. PATIENT NEEDS OFFICE VISIT FOR ADDITIONAL REFILLS 60 tablet 0  . simvastatin (ZOCOR) 20 MG tablet Take 1 tablet (20 mg total) by mouth at bedtime. 90 tablet 3  . valACYclovir (VALTREX) 1000 MG tablet Take 1 tablet (1,000 mg total) by mouth 3 (three) times daily. 21 tablet 0  . venlafaxine XR (EFFEXOR-XR) 150 MG 24 hr capsule Take 1 capsule (150 mg total) by mouth  daily with breakfast. 90 capsule 3   No current facility-administered medications for this visit.   Allergies  Allergen Reactions  . Other Hives    Contrast dye      Exam:  BP 130/72 mmHg  Pulse 88  Temp(Src) 99 F (37.2 C) (Oral)  Resp 16  Ht 5\' 4"  (1.626 m)  Wt 147 lb (66.679 kg)  BMI 25.22 kg/m2  SpO2 96% Gen: Well NAD HEENT: EOMI,  MMM Lungs: Normal work of breathing. CTABL Heart: RRR no MRG Abd: NABS, Soft. Nondistended, Nontender Exts: Brisk capillary refill, warm and well perfused.  Skin: Erythematous vesicular rash right neck extending into the right face and scalp. Mild right-sided cervical lymphadenopathy is present.  No results found for this or any previous visit (from the past 24 hour(s)). No results found.  Assessment and Plan: 50 y.o. female with shingles to the right face and C-spine area. Treat with Valtrex and Tylenol.  Follow-up with PCP soon. Refill one month metformin. Recommend patient discontinue smoking.  Discussed warning signs or symptoms. Please see discharge instructions. Patient expresses understanding.

## 2016-06-07 NOTE — Patient Instructions (Addendum)
Thank you for coming in today. Take valtrex three time daily for shingles.  Use tylenol for pain.  Follow up with your doctor soon.   Shingles Shingles, which is also known as herpes zoster, is an infection that causes a painful skin rash and fluid-filled blisters. Shingles is not related to genital herpes, which is a sexually transmitted infection.   Shingles only develops in people who:  Have had chickenpox.  Have received the chickenpox vaccine. (This is rare.) CAUSES Shingles is caused by varicella-zoster virus (VZV). This is the same virus that causes chickenpox. After exposure to VZV, the virus stays in the body in an inactive (dormant) state. Shingles develops if the virus reactivates. This can happen many years after the initial exposure to VZV. It is not known what causes this virus to reactivate. RISK FACTORS People who have had chickenpox or received the chickenpox vaccine are at risk for shingles. Infection is more common in people who:  Are older than age 14.  Have a weakened defense (immune) system, such as those with HIV, AIDS, or cancer.  Are taking medicines that weaken the immune system, such as transplant medicines.  Are under great stress. SYMPTOMS Early symptoms of this condition include itching, tingling, and pain in an area on your skin. Pain may be described as burning, stabbing, or throbbing. A few days or weeks after symptoms start, a painful red rash appears, usually on one side of the body in a bandlike or beltlike pattern. The rash eventually turns into fluid-filled blisters that break open, scab over, and dry up in about 2-3 weeks. At any time during the infection, you may also develop:  A fever.  Chills.  A headache.  An upset stomach. DIAGNOSIS This condition is diagnosed with a skin exam. Sometimes, skin or fluid samples are taken from the blisters before a diagnosis is made. These samples are examined under a microscope or sent to a lab for  testing. TREATMENT There is no specific cure for this condition. Your health care provider will probably prescribe medicines to help you manage pain, recover more quickly, and avoid long-term problems. Medicines may include:  Antiviral drugs.  Anti-inflammatory drugs.  Pain medicines. If the area involved is on your face, you may be referred to a specialist, such as an eye doctor (ophthalmologist) or an ear, nose, and throat (ENT) doctor to help you avoid eye problems, chronic pain, or disability. HOME CARE INSTRUCTIONS Medicines  Take medicines only as directed by your health care provider.  Apply an anti-itch or numbing cream to the affected area as directed by your health care provider. Blister and Rash Care  Take a cool bath or apply cool compresses to the area of the rash or blisters as directed by your health care provider. This may help with pain and itching.  Keep your rash covered with a loose bandage (dressing). Wear loose-fitting clothing to help ease the pain of material rubbing against the rash.  Keep your rash and blisters clean with mild soap and cool water or as directed by your health care provider.  Check your rash every day for signs of infection. These include redness, swelling, and pain that lasts or increases.  Do not pick your blisters.  Do not scratch your rash. General Instructions  Rest as directed by your health care provider.  Keep all follow-up visits as directed by your health care provider. This is important.  Until your blisters scab over, your infection can cause chickenpox in people who  have never had it or been vaccinated against it. To prevent this from happening, avoid contact with other people, especially:  Babies.  Pregnant women.  Children who have eczema.  Elderly people who have transplants.  People who have chronic illnesses, such as leukemia or AIDS. SEEK MEDICAL CARE IF:  Your pain is not relieved with prescribed  medicines.  Your pain does not get better after the rash heals.  Your rash looks infected. Signs of infection include redness, swelling, and pain that lasts or increases. SEEK IMMEDIATE MEDICAL CARE IF:  The rash is on your face or nose.  You have facial pain, pain around your eye area, or loss of feeling on one side of your face.  You have ear pain or you have ringing in your ear.  You have loss of taste.  Your condition gets worse.   This information is not intended to replace advice given to you by your health care provider. Make sure you discuss any questions you have with your health care provider.   Document Released: 12/08/2005 Document Revised: 12/29/2014 Document Reviewed: 10/19/2014 Elsevier Interactive Patient Education 2016 [employer].     IF you received an x-ray today, you will receive an invoice from [facility]. Please contact [facility] at [phone] with questions or concerns regarding your invoice.   IF you received labwork today, you will receive an invoice from [employer]. Please contact [facility] at [phone] with questions or concerns regarding your invoice.   Our billing staff will not be able to assist you with questions regarding bills from these companies.  You will be contacted with the lab results as soon as they are available. The fastest way to get your results is to activate your My Chart account. Instructions are located on the last page of this paperwork. If you have not heard from [place] regarding the results in 2 weeks, please contact this office.

## 2016-07-18 NOTE — Addendum Note (Signed)
Addended by: Thresa Ross C on: 07/18/2016 11:45 AM   Modules accepted: Orders

## 2016-10-20 ENCOUNTER — Other Ambulatory Visit: Payer: Self-pay | Admitting: Family Medicine

## 2016-10-20 DIAGNOSIS — I6523 Occlusion and stenosis of bilateral carotid arteries: Secondary | ICD-10-CM

## 2016-10-20 DIAGNOSIS — E785 Hyperlipidemia, unspecified: Secondary | ICD-10-CM

## 2016-11-10 ENCOUNTER — Other Ambulatory Visit: Payer: Self-pay

## 2016-11-10 MED ORDER — GLIPIZIDE ER 10 MG PO TB24: 10.0000 mg | ORAL_TABLET | Freq: Every day | ORAL | 0 refills | Status: DC

## 2016-11-12 ENCOUNTER — Ambulatory Visit: Payer: Self-pay | Admitting: Family

## 2016-11-12 ENCOUNTER — Encounter (HOSPITAL_COMMUNITY): Payer: Self-pay

## 2016-11-24 ENCOUNTER — Encounter: Payer: Self-pay | Admitting: Family

## 2016-11-28 ENCOUNTER — Encounter (HOSPITAL_COMMUNITY): Payer: Self-pay

## 2016-11-28 ENCOUNTER — Ambulatory Visit: Payer: Self-pay | Admitting: Family

## 2017-01-13 ENCOUNTER — Encounter: Payer: Self-pay | Admitting: Family

## 2017-01-19 ENCOUNTER — Ambulatory Visit (INDEPENDENT_AMBULATORY_CARE_PROVIDER_SITE_OTHER): Payer: Medicare Other | Admitting: Family

## 2017-01-19 ENCOUNTER — Encounter: Payer: Self-pay | Admitting: Family

## 2017-01-19 ENCOUNTER — Ambulatory Visit (HOSPITAL_COMMUNITY)
Admission: RE | Admit: 2017-01-19 | Discharge: 2017-01-19 | Disposition: A | Discharge status: Home / Self Care | Payer: Medicare Other | Source: Ambulatory Visit | Attending: Family | Admitting: Family

## 2017-01-19 VITALS — BP 182/80 | HR 74 | Temp 98.2°F | Resp 18 | Ht 64.0 in | Wt 147.0 lb

## 2017-01-19 DIAGNOSIS — I779 Disorder of arteries and arterioles, unspecified: Secondary | ICD-10-CM | POA: Diagnosis not present

## 2017-01-19 DIAGNOSIS — I6522 Occlusion and stenosis of left carotid artery: Secondary | ICD-10-CM

## 2017-01-19 DIAGNOSIS — Z9889 Other specified postprocedural states: Secondary | ICD-10-CM | POA: Diagnosis not present

## 2017-01-19 DIAGNOSIS — I10 Essential (primary) hypertension: Secondary | ICD-10-CM

## 2017-01-19 DIAGNOSIS — I6523 Occlusion and stenosis of bilateral carotid arteries: Secondary | ICD-10-CM | POA: Insufficient documentation

## 2017-01-19 DIAGNOSIS — Z48812 Encounter for surgical aftercare following surgery on the circulatory system: Secondary | ICD-10-CM | POA: Insufficient documentation

## 2017-01-19 DIAGNOSIS — I6521 Occlusion and stenosis of right carotid artery: Secondary | ICD-10-CM

## 2017-01-19 DIAGNOSIS — F17200 Nicotine dependence, unspecified, uncomplicated: Secondary | ICD-10-CM

## 2017-01-19 LAB — VAS US CAROTID
LCCADDIAS: 29 cm/s
LCCAPSYS: 116 cm/s
LEFT ECA DIAS: -23 cm/s
LICADDIAS: -36 cm/s
LICADSYS: -116 cm/s
Left CCA dist sys: 97 cm/s
Left CCA prox dias: 24 cm/s
Left ICA prox dias: -77 cm/s
Left ICA prox sys: -372 cm/s
RIGHT ECA DIAS: -18 cm/s

## 2017-01-19 MED ORDER — AMLODIPINE BESYLATE 10 MG PO TABS: 10.0000 mg | ORAL_TABLET | Freq: Every day | ORAL | 0 refills | Status: DC

## 2017-01-19 NOTE — Progress Notes (Signed)
Chief Complaint: Follow up Extracranial Carotid Artery Stenosis   History of Present Illness  Andrea [name] is a 51 y.o. female patient of Dr. Scot Dock who has undergone previous bilateral carotid endarterectomies by Dr. Drucie Opitz and also a femorofemoral bypass graft by Dr. Amedeo Plenty. She had described some left-sided weakness in July 2015 which was felt to be related to hypotension. She has a known right internal carotid artery occlusion. She had mild disease on the left which is actually improved at the time of her last follow up study.  She denies any subsequent history of stroke, TIAs, expressive or receptive aphasia, or amaurosis fugax. She denies significant claudication or rest pain.  She is on aspirin, Plavix, and a statin. (She is on simvastatin).  She states she ran out of her amlodipine and glipizide about a month ago, states her PCP left with no referral to any other PCP.  She is soon to run out of her metformin.   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  She walks 1-2 miles daily.   She denies any history of MI or cardiac stents, states she does have heart murmur.   Pt Diabetic: Yes, A1C in January 2017 was 6.4 (review of records); states her recent home glucose check was 121.  Pt smoker: current smoker, quit in 2012, smoked for about 30 years, resumed in December 2016  Pt meds include: Statin :Yes Betablocker: No ASA: Yes Other anticoagulants/antiplatelets: Plavix    Past Medical History:  Diagnosis Date  . Allergy   . Anemia   . Anxiety    on Alprazolam  . Atrial fibrillation ([place])   . Carotid artery occlusion   . Depression    on Effexor  . Diabetes mellitus without complication ([place])    [id number]  . Heart murmur    was seen by cardiologist in [facility] 2013  . Hypertension   . Peripheral vascular disease ([place])   . Stroke [facility])     Social History Social History  Substance Use Topics  . Smoking status:  Former Smoker    Quit date: 12/22/2010  . Smokeless tobacco: Never Used  . Alcohol use No     Comment: holidays/special occas 3-4 drinks    Family History Family History  Problem Relation Age of Onset  . Diabetes Mother   . Heart disease Mother   . Hyperlipidemia Mother   . Hypertension Mother   . Heart disease Father   . Hyperlipidemia Father   . Hypertension Father   . Diabetes Maternal Grandmother   . Heart disease Maternal Grandmother   . Hyperlipidemia Maternal Grandmother   . Cancer Paternal Grandmother   . Cancer Sister   . Varicose Veins Sister   . Heart attack Sister     Surgical History Past Surgical History:  Procedure Laterality Date  . 2 stents    . CAROTID ENDARTERECTOMY    . CESAREAN SECTION    . CHOLECYSTECTOMY    . FEMORAL BYPASS     x 2 on right side due to re-stenosis  . left/right carotid artery     2003 and 2009 for right, right side is 100% occluded per patient; left side 2009  . TUBAL LIGATION      Allergies  Allergen Reactions  . Other Hives    Contrast dye     Current Outpatient Prescriptions  Medication Sig Dispense Refill  . ALPRAZolam (XANAX) 0.5 MG tablet TAKE ONE TABLET  BY MOUTH THREE TIMES DAILY AS NEEDED FOR ANXIETY 90 tablet 0  . aspirin 325 MG tablet Take 325 mg by mouth daily.    . Blood Pressure Monitoring (BLOOD PRESSURE CUFF) MISC Use to check blood pressure. 1 each 0  . clopidogrel (PLAVIX) 75 MG tablet TAKE ONE TABLET BY MOUTH ONCE DAILY WITH  BREAKFAST 90 tablet 3  . cyclobenzaprine (FLEXERIL) 5 MG tablet Take 1 tablet (5 mg total) by mouth at bedtime as needed. 30 tablet 0  . glucose blood test strip Test blood sugar once daily. 100 each 2  . Lancets MISC Test blood sugar once daily. 100 each 2  . metFORMIN (GLUCOPHAGE) 1000 MG tablet Take 1 tablet (1,000 mg total) by mouth 2 (two) times daily with a meal. PATIENT NEEDS OFFICE VISIT FOR ADDITIONAL REFILLS 60 tablet 0  . simvastatin (ZOCOR) 20 MG tablet TAKE ONE TABLET BY  MOUTH AT BEDTIME 90 tablet 3  . venlafaxine XR (EFFEXOR-XR) 150 MG 24 hr capsule Take 1 capsule (150 mg total) by mouth daily with breakfast. 90 capsule 3  . amLODipine (NORVASC) 10 MG tablet Take 1 tablet (10 mg total) by mouth daily. 30 tablet 0  . glipiZIDE (GLUCOTROL XL) 10 MG 24 hr tablet Take 1 tablet (10 mg total) by mouth daily with breakfast. (Patient not taking: Reported on 01/19/2017) 30 tablet 0  . HYDROcodone-acetaminophen (NORCO) 5-325 MG tablet Take 1 tablet by mouth every 8 (eight) hours as needed for moderate pain. Take with stool softener, no extra tylenol with this. (Patient not taking: Reported on 06/07/2016) 30 tablet 0  . valACYclovir (VALTREX) 1000 MG tablet Take 1 tablet (1,000 mg total) by mouth 3 (three) times daily. (Patient not taking: Reported on 01/19/2017) 21 tablet 0   No current facility-administered medications for this visit.     Review of Systems : See HPI for pertinent positives and negatives.  Physical Examination  Vitals:   01/19/17 1301 01/19/17 1303 01/19/17 1307  BP: (!) 182/82 (!) 163/84 (!) 182/80  Pulse: 84 72 74  Resp:  18   Temp:  98.2 F (36.8 C)   TempSrc:  Oral   SpO2:  98%   Weight:  147 lb (66.7 kg)   Height:  5\' 4"  (1.626 m)    Body mass index is 25.23 kg/m.  General: WDWN in NAD Gait: Normal HENT: WNL Eyes: Pupils equal Pulmonary: normal non-labored breathing, no rales, rhonchi, or wheezing, adequate air movement in all fields. Cardiac: RRR, no murmur detected  Abdomen: soft, NT, no masses palpated Skin: no rashes, no ulcers, no cellulitis.   VASCULAR EXAM  Carotid Bruits Right Left   Positive Positive   Aorta is mildly palpable Femoral to femoral graft bypass has a palpable pulse.   VASCULAR EXAM: Extremities without ischemic changes, without Gangrene; without open wounds.      LE Pulses Right Left   FEMORAL 2+ palpable not palpable    POPLITEAL not palpable  not palpable   POSTERIOR TIBIAL not palpable  not palpable    DORSALIS PEDIS  ANTERIOR TIBIAL 2+ palpable  2+ palpable     Musculoskeletal: no muscle wasting or atrophy; no peripheral edema Neurologic: A&O X 3; Appropriate Affect ;  SENSATION: normal; MOTOR FUNCTION: 5/5 Symmetric, CN 2-12 intact Speech is fluent/normal      Assessment: AIDELYN BALSER is a 51 y.o. female who has undergone previous bilateral carotid endarterectomies by Dr. Drucie Opitz and also a femorofemoral bypass graft by Dr. Amedeo Plenty. She  had described some left-sided weakness in July 2015 which was felt to be related to hypotension. She has a known right internal carotid artery occlusion. She has moderate disease on the left ICA.  She denies any subsequent history of stroke, TIAs, expressive or receptive aphasia, or amaurosis fugax. She denies claudication sx's with walking; she walks 1-2 miles daily.  Pedal pulses are palpable.  Fortunately her DM remains in good control, but unfortunately she resumed smoking.   DATA She had normal ABI's in May of 2016. 04-24-16 ABI's: right remained normal with tri and biphasic waveforms, left decreased to 92% from 100%, waveforms remain triphasic. Both TBI's were normal.   Today's carotid duplex suggests known occlusion of the right internal and carotid common carotid artery. Right external carotid artery stenosis fed by branch. Left carotid endarterectomy site with restenosis of the surgical site at 50 - 69%; this may be overestimated due to compensatory flow.   Bilateral vertebral artery flow is antegrade.  Bilateral subclavian artery waveforms are normal.  No signifcant change since last exam on 04-24-16.   Plan:  She no longer  has  PCP, referral to Internal Medicine in [place] ASAP.  Her blood pressure is elevated; I gave her a 30 day refill prescription of amlodipine 10 mg po daily, disp# 30,  0 refills, The patient was counseled re smoking cessation and given several free resources re smoking cessation.  Continue walking 1-2 miles daily.   Follow-up in 6 months with Carotid Duplex scan and ABI's   I discussed in depth with the patient the nature of atherosclerosis, and emphasized the importance of maximal medical management including strict control of blood pressure, blood glucose, and lipid levels, obtaining regular exercise, and cessation of smoking.  The patient is aware that without maximal medical management the underlying atherosclerotic disease process will progress, limiting the benefit of any interventions. The patient was given information about stroke prevention and what symptoms should prompt the patient to seek immediate medical care. Thank you for allowing [place] to participate in this patient's care.  Clemon Chambers, RN, MSN, FNP-C [facility] Office: [phone]  Clinic Physician: Trula Slade  01/19/17 2:48 PM

## 2017-01-19 NOTE — Patient Instructions (Addendum)
Stroke Prevention Some medical conditions and behaviors are associated with an increased chance of having a stroke. You may prevent a stroke by making healthy choices and managing medical conditions. How can I reduce my risk of having a stroke?  Stay physically active. Get at least 30 minutes of activity on most or all days.  Do not smoke. It may also be helpful to avoid exposure to secondhand smoke.  Limit alcohol use. Moderate alcohol use is considered to be:  No more than 2 drinks per day for men.  No more than 1 drink per day for nonpregnant women.  Eat healthy foods. This involves:  Eating 5 or more servings of fruits and vegetables a day.  Making dietary changes that address high blood pressure (hypertension), high cholesterol, diabetes, or obesity.  Manage your cholesterol levels.  Making food choices that are high in fiber and low in saturated fat, trans fat, and cholesterol may control cholesterol levels.  Take any prescribed medicines to control cholesterol as directed by your health care provider.  Manage your diabetes.  Controlling your carbohydrate and sugar intake is recommended to manage diabetes.  Take any prescribed medicines to control diabetes as directed by your health care provider.  Control your hypertension.  Making food choices that are low in salt (sodium), saturated fat, trans fat, and cholesterol is recommended to manage hypertension.  Ask your health care provider if you need treatment to lower your blood pressure. Take any prescribed medicines to control hypertension as directed by your health care provider.  If you are 18-39 years of age, have your blood pressure checked every 3-5 years. If you are 40 years of age or older, have your blood pressure checked every year.  Maintain a healthy weight.  Reducing calorie intake and making food choices that are low in sodium, saturated fat, trans fat, and cholesterol are recommended to manage  weight.  Stop drug abuse.  Avoid taking birth control pills.  Talk to your health care provider about the risks of taking birth control pills if you are over 35 years old, smoke, get migraines, or have ever had a blood clot.  Get evaluated for sleep disorders (sleep apnea).  Talk to your health care provider about getting a sleep evaluation if you snore a lot or have excessive sleepiness.  Take medicines only as directed by your health care provider.  For some people, aspirin or blood thinners (anticoagulants) are helpful in reducing the risk of forming abnormal blood clots that can lead to stroke. If you have the irregular heart rhythm of atrial fibrillation, you should be on a blood thinner unless there is a good reason you cannot take them.  Understand all your medicine instructions.  Make sure that other conditions (such as anemia or atherosclerosis) are addressed. Get help right away if:  You have sudden weakness or numbness of the face, arm, or leg, especially on one side of the body.  Your face or eyelid droops to one side.  You have sudden confusion.  You have trouble speaking (aphasia) or understanding.  You have sudden trouble seeing in one or both eyes.  You have sudden trouble walking.  You have dizziness.  You have a loss of balance or coordination.  You have a sudden, severe headache with no known cause.  You have new chest pain or an irregular heartbeat. Any of these symptoms may represent a serious problem that is an emergency. Do not wait to see if the symptoms will go away.   Get medical help at once. Call your local emergency services (911 in [place] Do not drive yourself to the hospital. This information is not intended to replace advice given to you by your health care provider. Make sure you discuss any questions you have with your health care provider. Document Released: 01/15/2005 Document Revised: 05/15/2016 Document Reviewed: 06/10/2013 Elsevier  Interactive Patient Education  2017 Elsevier Inc.     Peripheral Vascular Disease Peripheral vascular disease (PVD) is a disease of the blood vessels that are not part of your heart and brain. A simple term for PVD is poor circulation. In most cases, PVD narrows the blood vessels that carry blood from your heart to the rest of your body. This can result in a decreased supply of blood to your arms, legs, and internal organs, like your stomach or kidneys. However, it most often affects a person's lower legs and feet. There are two types of PVD.  Organic PVD. This is the more common type. It is caused by damage to the structure of blood vessels.  Functional PVD. This is caused by conditions that make blood vessels contract and tighten (spasm). Without treatment, PVD tends to get worse over time. PVD can also lead to acute ischemic limb. This is when an arm or limb suddenly has trouble getting enough blood. This is a medical emergency. Follow these instructions at home:  Take medicines only as told by your doctor.  Do not use any tobacco products, including cigarettes, chewing tobacco, or electronic cigarettes. If you need help quitting, ask your doctor.  Lose weight if you are overweight, and maintain a healthy weight as told by your doctor.  Eat a diet that is low in fat and cholesterol. If you need help, ask your doctor.  Exercise regularly. Ask your doctor for some good activities for you.  Take good care of your feet.  Wear comfortable shoes that fit well.  Check your feet often for any cuts or sores. Contact a doctor if:  You have cramps in your legs while walking.  You have leg pain when you are at rest.  You have coldness in a leg or foot.  Your skin changes.  You are unable to get or have an erection (erectile dysfunction).  You have cuts or sores on your feet that are not healing. Get help right away if:  Your arm or leg turns cold and blue.  Your arms or legs  become red, warm, swollen, painful, or numb.  You have chest pain or trouble breathing.  You suddenly have weakness in your face, arm, or leg.  You become very confused or you cannot speak.  You suddenly have a very bad headache.  You suddenly cannot see. This information is not intended to replace advice given to you by your health care provider. Make sure you discuss any questions you have with your health care provider. Document Released: 03/04/2010 Document Revised: 05/15/2016 Document Reviewed: 05/18/2014 Elsevier Interactive Patient Education  2017 Elsevier Inc.       Steps to Quit Smoking Smoking tobacco can be bad for your health. It can also affect almost every organ in your body. Smoking puts you and people around you at risk for many serious long-lasting (chronic) diseases. Quitting smoking is hard, but it is one of the best things that you can do for your health. It is never too late to quit. What are the benefits of quitting smoking? When you quit smoking, you lower your risk for getting serious   diseases and conditions. They can include:  Lung cancer or lung disease.  Heart disease.  Stroke.  Heart attack.  Not being able to have children (infertility).  Weak bones (osteoporosis) and broken bones (fractures). If you have coughing, wheezing, and shortness of breath, those symptoms may get better when you quit. You may also get sick less often. If you are pregnant, quitting smoking can help to lower your chances of having a baby of low birth weight. What can I do to help me quit smoking? Talk with your doctor about what can help you quit smoking. Some things you can do (strategies) include:  Quitting smoking totally, instead of slowly cutting back how much you smoke over a period of time.  Going to in-person counseling. You are more likely to quit if you go to many counseling sessions.  Using resources and support systems, such as:  Online chats with a  counselor.  Phone quitlines.  Printed self-help materials.  Support groups or group counseling.  Text messaging programs.  Mobile phone apps or applications.  Taking medicines. Some of these medicines may have nicotine in them. If you are pregnant or breastfeeding, do not take any medicines to quit smoking unless your doctor says it is okay. Talk with your doctor about counseling or other things that can help you. Talk with your doctor about using more than one strategy at the same time, such as taking medicines while you are also going to in-person counseling. This can help make quitting easier. What things can I do to make it easier to quit? Quitting smoking might feel very hard at first, but there is a lot that you can do to make it easier. Take these steps:  Talk to your family and friends. Ask them to support and encourage you.  Call phone quitlines, reach out to support groups, or work with a counselor.  Ask people who smoke to not smoke around you.  Avoid places that make you want (trigger) to smoke, such as:  Bars.  Parties.  Smoke-break areas at work.  Spend time with people who do not smoke.  Lower the stress in your life. Stress can make you want to smoke. Try these things to help your stress:  Getting regular exercise.  Deep-breathing exercises.  Yoga.  Meditating.  Doing a body scan. To do this, close your eyes, focus on one area of your body at a time from head to toe, and notice which parts of your body are tense. Try to relax the muscles in those areas.  Download or buy apps on your mobile phone or tablet that can help you stick to your quit plan. There are many free apps, such as QuitGuide from the CDC (Centers for Disease Control and Prevention). You can find more support from smokefree.gov and other websites. This information is not intended to replace advice given to you by your health care provider. Make sure you discuss any questions you have with  your health care provider. Document Released: 10/04/2009 Document Revised: 08/05/2016 Document Reviewed: 04/24/2015 Elsevier Interactive Patient Education  2017 Elsevier Inc.  

## 2017-02-07 ENCOUNTER — Other Ambulatory Visit: Payer: Self-pay

## 2017-02-07 DIAGNOSIS — F419 Anxiety disorder, unspecified: Principal | ICD-10-CM

## 2017-02-07 DIAGNOSIS — F32A Depression, unspecified: Secondary | ICD-10-CM

## 2017-02-07 DIAGNOSIS — F329 Major depressive disorder, single episode, unspecified: Secondary | ICD-10-CM

## 2017-02-07 MED ORDER — VENLAFAXINE HCL ER 150 MG PO CP24: 150.0000 mg | ORAL_CAPSULE | Freq: Every day | ORAL | 0 refills | Status: DC

## 2017-02-24 ENCOUNTER — Telehealth: Payer: Self-pay | Admitting: Family Medicine

## 2017-02-24 NOTE — Telephone Encounter (Signed)
lmom to call if she's not going to be able to make appointment on 02-25-17 at 9:30

## 2017-02-25 ENCOUNTER — Ambulatory Visit (INDEPENDENT_AMBULATORY_CARE_PROVIDER_SITE_OTHER): Payer: Medicare Other | Admitting: Physician Assistant

## 2017-02-25 VITALS — BP 138/66 | HR 79 | Temp 98.3°F | Resp 16 | Ht 64.0 in | Wt 149.0 lb

## 2017-02-25 DIAGNOSIS — F329 Major depressive disorder, single episode, unspecified: Secondary | ICD-10-CM

## 2017-02-25 DIAGNOSIS — E785 Hyperlipidemia, unspecified: Secondary | ICD-10-CM

## 2017-02-25 DIAGNOSIS — I6523 Occlusion and stenosis of bilateral carotid arteries: Secondary | ICD-10-CM | POA: Diagnosis not present

## 2017-02-25 DIAGNOSIS — E119 Type 2 diabetes mellitus without complications: Secondary | ICD-10-CM

## 2017-02-25 DIAGNOSIS — F419 Anxiety disorder, unspecified: Secondary | ICD-10-CM

## 2017-02-25 DIAGNOSIS — F418 Other specified anxiety disorders: Secondary | ICD-10-CM | POA: Diagnosis not present

## 2017-02-25 DIAGNOSIS — I10 Essential (primary) hypertension: Secondary | ICD-10-CM | POA: Diagnosis not present

## 2017-02-25 MED ORDER — METFORMIN HCL 1000 MG PO TABS: 1000.0000 mg | ORAL_TABLET | Freq: Two times a day (BID) | ORAL | 0 refills | Status: DC

## 2017-02-25 MED ORDER — SIMVASTATIN 20 MG PO TABS: 20.0000 mg | ORAL_TABLET | Freq: Every day | ORAL | 3 refills | Status: DC

## 2017-02-25 MED ORDER — GLUCOSE BLOOD VI STRP: ORAL_STRIP | 2 refills | Status: DC

## 2017-02-25 MED ORDER — AMLODIPINE BESYLATE 10 MG PO TABS: 10.0000 mg | ORAL_TABLET | Freq: Every day | ORAL | 1 refills | Status: DC

## 2017-02-25 MED ORDER — ALPRAZOLAM 0.25 MG PO TABS: 0.2500 mg | ORAL_TABLET | ORAL | 0 refills | Status: DC | PRN

## 2017-02-25 MED ORDER — GLIPIZIDE ER 10 MG PO TB24: 10.0000 mg | ORAL_TABLET | Freq: Every day | ORAL | 0 refills | Status: DC

## 2017-02-25 MED ORDER — VENLAFAXINE HCL ER 150 MG PO CP24: 150.0000 mg | ORAL_CAPSULE | Freq: Every day | ORAL | 3 refills | Status: DC

## 2017-02-25 MED ORDER — LANCETS MISC: 2 refills | Status: DC

## 2017-02-25 MED ORDER — CLOPIDOGREL BISULFATE 75 MG PO TABS: 75.0000 mg | ORAL_TABLET | Freq: Every day | ORAL | 1 refills | Status: DC

## 2017-02-25 NOTE — Progress Notes (Signed)
Andrea [name]  MRN: [id number] DOB: Apr 26, 1966  Subjective:  Andrea [name] is a 51 y.o. female seen in office today for a chief complaint of medication refill. She has not been here for a proper visit since 01/03/2016. She has a PMH of HTN, HLD, T2DM, bilateral carotid artery stenosis, anxiety and depression. She was followed by Dr. Marin Comment here.   She has been out of all of her medications except effexor for the past month.   For HTN: She takes norvasc 74m daily. She checks it intermittently but cannot recall what it typically runs.  For HLD: She takes simvastatin 20 mg daily.   For T2DM: Takes metformin 10078mBID and glipizide 1028maily. She checks her sugars typically but has been out of lancets as well. Notes when she is on the medication it typically runs around 120. No known diabetes complications.   For bilateral carotid artery stenosis: Takes aspirin 325 mg and plavix 38m53mily. Has been on this regimen since 2005March 07, 2005e follows closely with a vascular surgery every 6 months. Last appointment was 01/19/17.  For anxiety: Takes xanax 0.5 mg as needed. Has been on this since 2011March 07, 2011n her parents died. She takes it on holidays or days that it is hard for her to get by. A bottle of 30 tablets typically will last her a year.   For depression: Takes effexor-xr 150mg67mly. She has been on this since 2003.2002/02/25 used to see a therapist and she really enjoyed that but her therapist moved.   Review of Systems  Constitutional: Negative for chills, diaphoresis and fever.  HENT: Positive for sinus pressure.   Respiratory: Negative for cough, shortness of breath and wheezing.   Cardiovascular: Negative for chest pain, palpitations and leg swelling.  Gastrointestinal: Negative for abdominal pain, constipation, diarrhea, nausea and vomiting.  Allergic/Immunologic: Positive for environmental allergies.  Neurological: Negative for dizziness, facial asymmetry, weakness, light-headedness,  numbness and headaches.  Psychiatric/Behavioral: Negative for suicidal ideas.    Patient Active Problem List   Diagnosis Date Noted  . Shingles 06/07/2016  . Bilateral carotid artery stenosis 10/18/2014  . TIA (transient ischemic attack) 08/29/2014  . HLD (hyperlipidemia) 08/29/2014  . DM (diabetes mellitus) with complications (HCC) [id number] (hypertension), benign 08/29/2014  . Stroke (HCC) [place]02/2015  . CVA (cerebral infarction) 06/22/2014  . PVD (peripheral vascular disease) (HCC) [place]22/2015  . Occlusion and stenosis of carotid artery without mention of cerebral infarction 04/12/2014    Current Outpatient Prescriptions on File Prior to Visit  Medication Sig Dispense Refill  . ALPRAZolam (XANAX) 0.5 MG tablet TAKE ONE TABLET BY MOUTH THREE TIMES DAILY AS NEEDED FOR ANXIETY 90 tablet 0  . amLODipine (NORVASC) 10 MG tablet Take 1 tablet (10 mg total) by mouth daily. 30 tablet 0  . aspirin 325 MG tablet Take 325 mg by mouth daily.    . Blood Pressure Monitoring (BLOOD PRESSURE CUFF) MISC Use to check blood pressure. 1 each 0  . clopidogrel (PLAVIX) 75 MG tablet TAKE ONE TABLET BY MOUTH ONCE DAILY WITH  BREAKFAST 90 tablet 3  . glipiZIDE (GLUCOTROL XL) 10 MG 24 hr tablet Take 1 tablet (10 mg total) by mouth daily with breakfast. 30 tablet 0  . glucose blood test strip Test blood sugar once daily. 100 each 2  . Lancets MISC Test blood sugar once daily. 100 each 2  . metFORMIN (GLUCOPHAGE) 1000 MG tablet Take 1 tablet (1,000 mg total) by mouth 2 (two) times daily  with a meal. PATIENT NEEDS OFFICE VISIT FOR ADDITIONAL REFILLS 60 tablet 0  . simvastatin (ZOCOR) 20 MG tablet TAKE ONE TABLET BY MOUTH AT BEDTIME 90 tablet 3  . venlafaxine XR (EFFEXOR-XR) 150 MG 24 hr capsule Take 1 capsule (150 mg total) by mouth daily with breakfast. 30 capsule 0  . HYDROcodone-acetaminophen (NORCO) 5-325 MG tablet Take 1 tablet by mouth every 8 (eight) hours as needed for moderate pain. Take with  stool softener, no extra tylenol with this. (Patient not taking: Reported on 02/25/2017) 30 tablet 0   No current facility-administered medications on file prior to visit.     Allergies  Allergen Reactions  . Other Hives    Contrast dye        Social History   Social History  . Marital status: Married    Spouse name: N/A  . Number of children: 1  . Years of education: College   Occupational History  . retired/disabled Unemployed   Social History Main Topics  . Smoking status: Current Every Day Smoker    Packs/day: 0.50    Types: Cigarettes  . Smokeless tobacco: Never Used     Comment: quit in 2012, started again in 2017  . Alcohol use No     Comment: holidays/special occas 3-4 drinks  . Drug use: No  . Sexual activity: Yes   Other Topics Concern  . Not on file   Social History Narrative   Patient lives at home with her husband and son   Patient is right handed   Patient drinks coffee daily    Objective:  BP 138/66   Pulse 79   Temp 98.3 F (36.8 C) (Oral)   Resp 16   Ht _0  (1.626 m)   Wt 149 lb (67.6 kg)   SpO2 99%   BMI 25.58 kg/m   Physical Exam  Constitutional: She is oriented to person, place, and time and well-developed, well-nourished, and in no distress.  HENT:  Head: Normocephalic and atraumatic.  Mouth/Throat: Abnormal dentition.  Eyes: Conjunctivae are normal.  Neck: Normal range of motion.  Cardiovascular: Normal rate, regular rhythm and normal pulses.   Murmur heard.  Systolic murmur is present  Pulmonary/Chest: Effort normal and breath sounds normal.  Musculoskeletal:       Right lower leg: She exhibits no swelling.       Left lower leg: She exhibits no swelling.  Neurological: She is alert and oriented to person, place, and time. Gait normal.  Skin: Skin is warm and dry.  Psychiatric: Affect normal.  Vitals reviewed.   Assessment and Plan :  1. Essential hypertension Controlled in office.  - CBC with  Differential/Platelet - Lipid panel - CMP14+EGFR - amLODipine (NORVASC) 10 MG tablet; Take 1 tablet (10 mg total) by mouth daily.  Dispense: 90 tablet; Refill: 1  2. Type 2 diabetes mellitus without complication, without long-term current use of insulin (HCC) Return in 3 months for follow up, will do full diabetes exam during that visit.  - CMP14+EGFR - Hemoglobin A1c - Microalbumin / creatinine urine ratio - metFORMIN (GLUCOPHAGE) 1000 MG tablet; Take 1 tablet (1,000 mg total) by mouth 2 (two) times daily with a meal.  Dispense: 180 tablet; Refill: 0 - Lancets MISC; Test blood sugar once daily.  Dispense: 100 each; Refill: 2 - glucose blood test strip; Test blood sugar once daily.  Dispense: 100 each; Refill: 2 - glipiZIDE (GLUCOTROL XL) 10 MG 24 hr tablet; Take 1 tablet (10 mg total)  by mouth daily with breakfast.  Dispense: 90 tablet; Refill: 0  3. Anxiety and depression Contact info for local therapists given. Discussed with patient how I will give her a refill for xanax at this appointment but will not be willing to continue this for anxiety in the future. We can try other agents such as vistaril as this does not have habit forming tendencies like benzos.  - venlafaxine XR (EFFEXOR-XR) 150 MG 24 hr capsule; Take 1 capsule (150 mg total) by mouth daily with breakfast.  Dispense: 90 capsule; Refill: 3 - ALPRAZolam (XANAX) 0.25 MG tablet; Take 1 tablet (0.25 mg total) by mouth as needed for anxiety.  Dispense: 20 tablet; Refill: 0  4. Dyslipidemia - simvastatin (ZOCOR) 20 MG tablet; Take 1 tablet (20 mg total) by mouth at bedtime.  Dispense: 90 tablet; Refill: 3  5. Bilateral carotid artery stenosis -Pt has never been followed by cardiology but would likely benefit from cardiology referral due to extensive medical history placing her at increased risk for heart disease.  - clopidogrel (PLAVIX) 75 MG tablet; Take 1 tablet (75 mg total) by mouth daily.  Dispense: 90 tablet; Refill: 1 -  Ambulatory referral to Cardiology  Tenna Delaine PA-C  [facility] 02/25/2017 9:51 AM

## 2017-02-25 NOTE — Patient Instructions (Addendum)
For T2DM: Please follow up in 3 months as we will need to recheck your A1C at this time to see how controlled you are on your current medications. It is very important from a health standpoint that you follow up frequently so you can gain better control over your health. Check sugars daily. Goal is as close to 100 as possible.   For carotid artery stenosis: I have given you a refill for plavix but I would like you to also see a cardiologist as they are better at determining the optimal dose for antiplatelet therapy. I have put in a referral and they should contact you within the next two weeks.  For situational anxiety: I have given you enough tablets to help throughout the year, as discussed we are really trying to get away from using this type of medication for anxiety. In the future, I will be unwilling to prescribe this medication but will offer you different options that can be effective for situational anxiety.   For depression: here are the numbers for local therapists, please contact them as soon as you can   [facility] [place], [place] [address]   Burnard Leigh [phone]  Horton Finer [phone]  Everardo Beals [phone]     IF you received an x-ray today, you will receive an invoice from [facility]. Please contact [facility] at [phone] with questions or concerns regarding your invoice.   IF you received labwork today, you will receive an invoice from [facility]. Please contact [facility] at [phone] with questions or concerns regarding your invoice.   Our billing staff will not be able to assist you with questions regarding bills from these companies.  You will be contacted with the lab results as soon as they are available. The fastest way to get your results is to activate your My Chart account. Instructions are located on the last page of this paperwork. If you have not heard from [place] regarding the  results in 2 weeks, please contact this office.

## 2017-02-26 LAB — CMP14+EGFR
ALK PHOS: 126 IU/L — AB (ref 39–117)
ALT: 12 IU/L (ref 0–32)
AST: 15 IU/L (ref 0–40)
Albumin/Globulin Ratio: 1.4 (ref 1.2–2.2)
Albumin: 4.6 g/dL (ref 3.5–5.5)
BUN/Creatinine Ratio: 18 (ref 9–23)
BUN: 10 mg/dL (ref 6–24)
Bilirubin Total: 0.2 mg/dL (ref 0.0–1.2)
CALCIUM: 10.1 mg/dL (ref 8.7–10.2)
CO2: 24 mmol/L (ref 18–29)
CREATININE: 0.55 mg/dL — AB (ref 0.57–1.00)
Chloride: 100 mmol/L (ref 96–106)
GFR calc Af Amer: 124 mL/min/{1.73_m2} (ref >59)
GFR, EST NON AFRICAN AMERICAN: 107 mL/min/{1.73_m2} (ref >59)
GLOBULIN, TOTAL: 3.2 g/dL (ref 1.5–4.5)
GLUCOSE: 198 mg/dL — AB (ref 65–99)
Potassium: 5.2 mmol/L (ref 3.5–5.2)
SODIUM: 143 mmol/L (ref 134–144)
Total Protein: 7.8 g/dL (ref 6.0–8.5)

## 2017-02-26 LAB — CBC WITH DIFFERENTIAL/PLATELET
BASOS ABS: 0.1 10*3/uL (ref 0.0–0.2)
BASOS: 1 %
EOS (ABSOLUTE): 0.2 10*3/uL (ref 0.0–0.4)
Eos: 2 %
HEMOGLOBIN: 16.4 g/dL — AB (ref 11.1–15.9)
Hematocrit: 47.7 % — ABNORMAL HIGH (ref 34.0–46.6)
Immature Grans (Abs): 0 10*3/uL (ref 0.0–0.1)
Immature Granulocytes: 0 %
LYMPHS ABS: 3.1 10*3/uL (ref 0.7–3.1)
Lymphs: 32 %
MCH: 30.5 pg (ref 26.6–33.0)
MCHC: 34.4 g/dL (ref 31.5–35.7)
MCV: 89 fL (ref 79–97)
Monocytes Absolute: 0.9 10*3/uL (ref 0.1–0.9)
Monocytes: 9 %
NEUTROS ABS: 5.6 10*3/uL (ref 1.4–7.0)
Neutrophils: 56 %
PLATELETS: 266 10*3/uL (ref 150–379)
RBC: 5.37 x10E6/uL — ABNORMAL HIGH (ref 3.77–5.28)
RDW: 14.5 % (ref 12.3–15.4)
WBC: 9.8 10*3/uL (ref 3.4–10.8)

## 2017-02-26 LAB — MICROALBUMIN / CREATININE URINE RATIO: CREATININE, UR: 19.9 mg/dL

## 2017-02-26 LAB — LIPID PANEL
Chol/HDL Ratio: 7.5 ratio units — ABNORMAL HIGH (ref 0.0–4.4)
Cholesterol, Total: 284 mg/dL — ABNORMAL HIGH (ref 100–199)
HDL: 38 mg/dL — ABNORMAL LOW (ref >39)
LDL CALC: 171 mg/dL — AB (ref 0–99)
Triglycerides: 374 mg/dL — ABNORMAL HIGH (ref 0–149)
VLDL Cholesterol Cal: 75 mg/dL — ABNORMAL HIGH (ref 5–40)

## 2017-02-26 LAB — HEMOGLOBIN A1C
ESTIMATED AVERAGE GLUCOSE: 189 mg/dL
HEMOGLOBIN A1C: 8.2 % — AB (ref 4.8–5.6)

## 2017-03-10 ENCOUNTER — Other Ambulatory Visit: Payer: Self-pay | Admitting: Family Medicine

## 2017-03-10 DIAGNOSIS — F419 Anxiety disorder, unspecified: Principal | ICD-10-CM

## 2017-03-10 DIAGNOSIS — F32A Depression, unspecified: Secondary | ICD-10-CM

## 2017-03-10 DIAGNOSIS — F329 Major depressive disorder, single episode, unspecified: Secondary | ICD-10-CM

## 2017-03-13 ENCOUNTER — Encounter: Payer: Self-pay | Admitting: Physician Assistant

## 2017-03-14 ENCOUNTER — Other Ambulatory Visit: Payer: Self-pay

## 2017-03-14 DIAGNOSIS — F419 Anxiety disorder, unspecified: Principal | ICD-10-CM

## 2017-03-14 DIAGNOSIS — F329 Major depressive disorder, single episode, unspecified: Secondary | ICD-10-CM

## 2017-03-14 MED ORDER — VENLAFAXINE HCL ER 150 MG PO CP24: 150.0000 mg | ORAL_CAPSULE | Freq: Every day | ORAL | 1 refills | Status: DC

## 2017-03-25 ENCOUNTER — Encounter: Payer: Self-pay | Admitting: Physician Assistant

## 2017-03-25 NOTE — Progress Notes (Deleted)
Cardiology Office Note    Date:  03/25/2017  ID:  Andrea [name], DOB October 24, 1966, MRN [id number] PCP:  No primary care provider on file.  Cardiologist:  New, reviewed with ***   Chief Complaint: cardiovascular screening  History of Present Illness:  Andrea [name] is a 51 y.o. female with history of HTN, HLD, DM2, bilateral carotid disease s/p prior endarterectomies with known RICA occlusion, and PVD s/p fem-fem BPG (followed by [facility]), anxiety, depression, possible prior stroke/TIA 2015 whom we are asked to see for cardiovascular screening.  She has prior entry of atrial fib into her chart but the patient denies this. Neuro notes in 2015 indicates she had reported these were ectopic beats only. Last labs 02/2016 showed trig 374, HDL 38, LDL 171, alk phos 126 otherwise LFTs ok, K 5.2, Cr 0.55,, A1C 8.2, Hgb 16.4.  stroke/tia? atrial fib?    Past Medical History:  Diagnosis Date  . Allergy   . Anemia   . Anxiety    on Alprazolam  . Carotid artery occlusion    a. s/p prior endarterectomies with known RICA occlusion - followed by [facility].  . Depression    on Effexor  . Diabetes mellitus without complication ([place])    8550  . Heart murmur    was seen by cardiologist in [facility] 2013  . Hypertension   . Peripheral vascular disease ([place])   . PVD (peripheral vascular disease) ([place])    a. s/p prior LE bypass grafting and revision (followed by [facility]).  . Stroke [facility])     Past Surgical History:  Procedure Laterality Date  . 2 stents    . CAROTID ENDARTERECTOMY    . CESAREAN SECTION    . CHOLECYSTECTOMY    . FEMORAL BYPASS     x 2 on right side due to re-stenosis  . left/right carotid artery     2003 and 2009 for right, right side is 100% occluded per patient; left side 2009  . TUBAL LIGATION      Current Medications: Current Outpatient Prescriptions  Medication Sig Dispense Refill  . ALPRAZolam (XANAX) 0.25 MG tablet Take 1 tablet (0.25 mg total) by mouth as needed for  anxiety. 20 tablet 0  . amLODipine (NORVASC) 10 MG tablet Take 1 tablet (10 mg total) by mouth daily. 90 tablet 1  . aspirin 325 MG tablet Take 325 mg by mouth daily.    . Blood Pressure Monitoring (BLOOD PRESSURE CUFF) MISC Use to check blood pressure. 1 each 0  . clopidogrel (PLAVIX) 75 MG tablet Take 1 tablet (75 mg total) by mouth daily. 90 tablet 1  . glipiZIDE (GLUCOTROL XL) 10 MG 24 hr tablet Take 1 tablet (10 mg total) by mouth daily with breakfast. 90 tablet 0  . glucose blood test strip Test blood sugar once daily. 100 each 2  . HYDROcodone-acetaminophen (NORCO) 5-325 MG tablet Take 1 tablet by mouth every 8 (eight) hours as needed for moderate pain. Take with stool softener, no extra tylenol with this. (Patient not taking: Reported on 02/25/2017) 30 tablet 0  . Lancets MISC Test blood sugar once daily. 100 each 2  . metFORMIN (GLUCOPHAGE) 1000 MG tablet Take 1 tablet (1,000 mg total) by mouth 2 (two) times daily with a meal. 180 tablet 0  . simvastatin (ZOCOR) 20 MG tablet Take 1 tablet (20 mg total) by mouth at bedtime. 90 tablet 3  . venlafaxine XR (EFFEXOR-XR) 150 MG 24 hr capsule TAKE 1 CAPSULE BY MOUTH  DAILY WITH BREAKFAST 30 capsule 0  . venlafaxine XR (EFFEXOR-XR) 150 MG 24 hr capsule Take 1 capsule (150 mg total) by mouth daily with breakfast. 90 capsule 1   No current facility-administered medications for this visit.      Allergies:   Other   Social History   Social History  . Marital status: Married    Spouse name: N/A  . Number of children: 1  . Years of education: College   Occupational History  . retired/disabled Unemployed   Social History Main Topics  . Smoking status: Current Every Day Smoker    Packs/day: 0.50    Types: Cigarettes  . Smokeless tobacco: Never Used     Comment: quit in 2012, started again in 2017  . Alcohol use No     Comment: holidays/special occas 3-4 drinks  . Drug use: No  . Sexual activity: Yes   Other Topics Concern  . Not on  file   Social History Narrative   Patient lives at home with her husband and son   Patient is right handed   Patient drinks coffee daily     Family History:  Family History  Problem Relation Age of Onset  . Diabetes Mother   . Heart disease Mother   . Hyperlipidemia Mother   . Hypertension Mother   . Heart disease Father   . Hyperlipidemia Father   . Hypertension Father   . Diabetes Maternal Grandmother   . Heart disease Maternal Grandmother   . Hyperlipidemia Maternal Grandmother   . Cancer Paternal Grandmother   . Cancer Sister   . Varicose Veins Sister   . Heart attack Sister    ***  ROS:   Please see the history of present illness. Otherwise, review of systems is positive for ***.  All other systems are reviewed and otherwise negative.    PHYSICAL EXAM:   VS:  There were no vitals taken for this visit.  BMI: There is no height or weight on file to calculate BMI. GEN: Well nourished, well developed, in no acute distress  HEENT: normocephalic, atraumatic Neck: no JVD, carotid bruits, or masses Cardiac: ***RRR; no murmurs, rubs, or gallops, no edema  Respiratory:  clear to auscultation bilaterally, normal work of breathing GI: soft, nontender, nondistended, + BS MS: no deformity or atrophy  Skin: warm and dry, no rash Neuro:  Alert and Oriented x 3, Strength and sensation are intact, follows commands Psych: euthymic mood, full affect  Wt Readings from Last 3 Encounters:  02/25/17 149 lb (67.6 kg)  01/19/17 147 lb (66.7 kg)  06/07/16 147 lb (66.7 kg)      Studies/Labs Reviewed:   EKG:  EKG was ordered today and personally reviewed by me and demonstrates *** EKG was not ordered today.***  Recent Labs: 02/25/2017: ALT 12; BUN 10; Creatinine, Ser 0.55; Platelets 266; Potassium 5.2; Sodium 143   Lipid Panel    Component Value Date/Time   CHOL 284 (H) 02/25/2017 1020   TRIG 374 (H) 02/25/2017 1020   HDL 38 (L) 02/25/2017 1020   CHOLHDL 7.5 (H) 02/25/2017  1020   CHOLHDL 6.7 (H) 08/13/2015 1057   VLDL 62 (H) 08/13/2015 1057   LDLCALC 171 (H) 02/25/2017 1020    Additional studies/ records that were reviewed today include: Summarized above.***    ASSESSMENT & PLAN:   1. Screening for CV disease 2. PAD/carotid disease 3. Essential HTN 4. Hyperlipidemia  Disposition: F/u with ***   Medication Adjustments/Labs and Tests Ordered:  Current medicines are reviewed at length with the patient today.  Concerns regarding medicines are outlined above. Medication changes, Labs and Tests ordered today are summarized above and listed in the Patient Instructions accessible in Encounters.   Raechel Ache PA-C  03/25/2017 5:28 PM    [facility] [address], [place], [place]  [address] Phone: [phone]; Fax: [phone]

## 2017-03-26 ENCOUNTER — Ambulatory Visit: Payer: Medicare Other | Admitting: Physician Assistant

## 2017-04-22 ENCOUNTER — Other Ambulatory Visit: Payer: Self-pay | Admitting: Physician Assistant

## 2017-04-22 DIAGNOSIS — F419 Anxiety disorder, unspecified: Principal | ICD-10-CM

## 2017-04-22 DIAGNOSIS — F329 Major depressive disorder, single episode, unspecified: Secondary | ICD-10-CM

## 2017-04-22 NOTE — Telephone Encounter (Signed)
Not sure why this is being requested because it looks like you gave her a year early in March. Defer to your judgement.  Philis Fendt, MS, PA-C 9:08 AM, 04/22/2017

## 2017-04-22 NOTE — Telephone Encounter (Signed)
Please call pharmacy and let them know that this is a duplicate medication. I have placed a 90 day prescription with one additional refill for effexor-xr 150mg  tablets on 03/14/17. Therefore, the patient should not need refills at this time. Please ask the pharmacy to delete this extra medication. Thanks!

## 2017-04-26 NOTE — Progress Notes (Signed)
Cardiology Office Note    Date:  04/27/2017  ID:  Andrea [name], DOB Feb 03, 1966, MRN [id number] PCP:  Patient, No Pcp Per  Cardiologist:  New, reviewed with Dr. Acie Fredrickson   Chief Complaint: cardiovascular screening given known vascular disease  History of Present Illness:  Andrea [name] is a 51 y.o. female with history of HTN, HLD, DM2, bilateral carotid disease s/p prior endarterectomies with known RICA occlusion, and PVD s/p fem-fem BPG (followed by [facility]), anxiety, depression, possible prior TIA 2015, longstanding tobacco since age 3 who was referred for evaluation of cardiovascular screening by [place] PA-C.    She was remotely evaluated by a nuclear stress test in 2003 which showed mild anteroapical thinning felt due to breast attenuation artifact. She has prior entry of atrial fib into her chart but the patient denies this. Neuro notes in 2015 indicates she had reported these were ectopic beats only. She was evaluated by cardiologist in [facility] remotely and told she only had mild leakiness of a heart valve with a heart murmur. She does not recall who her cardiologist was at that time nor does she remember the facility. Her husband is [place] who sees Dr. Meda Coffee. Last labs 02/2017 showed trig 374, HDL 38, LDL 171, alk phos 126 otherwise LFTs ok, K 5.2, Cr 0.55, A1C 8.2, Hgb 16.4.   She presents for new patient evaluation. Fortunately she is asymptomatic and denies any history of chest pain, SOB, lower extremity edema or syncope. She has rare "flip" in her chest like her heart is flipping over but it is only one isolated palpitation at a time. No sustained tachypalpitations or irregularity. She states at time labs were obtained 02/2017 she was off meds. Multiple family members, especially on mom's side with heart and vascular disease. Mom was same age -- 21 -- when vascular disease started. Brother had MI last year at age 38.    Past Medical History:  Diagnosis Date  .  Allergy   . Anemia   . Anxiety    on Alprazolam  . Carotid artery occlusion    a. s/p prior endarterectomies with known RICA occlusion - followed by [facility].  . Depression    on Effexor  . Diabetes mellitus without complication ([place])    8099  . Heart murmur    was seen by cardiologist in [facility] 2013  . Hypertension   . Peripheral vascular disease ([place])   . PVD (peripheral vascular disease) ([place])    a. s/p prior LE bypass grafting and revision (followed by [facility]).  . Stroke [facility])     Past Surgical History:  Procedure Laterality Date  . 2 stents    . CAROTID ENDARTERECTOMY    . CESAREAN SECTION    . CHOLECYSTECTOMY    . FEMORAL BYPASS     x 2 on right side due to re-stenosis  . left/right carotid artery     2003 and 2009 for right, right side is 100% occluded per patient; left side 2009  . TUBAL LIGATION      Current Medications: Current Outpatient Prescriptions  Medication Sig Dispense Refill  . ALPRAZolam (XANAX) 0.25 MG tablet Take 1 tablet (0.25 mg total) by mouth as needed for anxiety. 20 tablet 0  . amLODipine (NORVASC) 10 MG tablet Take 1 tablet (10 mg total) by mouth daily. 90 tablet 1  . aspirin 325 MG tablet Take 325 mg by mouth daily.    . clopidogrel (PLAVIX) 75 MG tablet Take  1 tablet (75 mg total) by mouth daily. 90 tablet 1  . glipiZIDE (GLUCOTROL XL) 10 MG 24 hr tablet Take 1 tablet (10 mg total) by mouth daily with breakfast. 90 tablet 0  . Lancets MISC Test blood sugar once daily. 100 each 2  . metFORMIN (GLUCOPHAGE) 1000 MG tablet Take 1 tablet (1,000 mg total) by mouth 2 (two) times daily with a meal. 180 tablet 0  . simvastatin (ZOCOR) 20 MG tablet Take 1 tablet (20 mg total) by mouth at bedtime. 90 tablet 3  . venlafaxine XR (EFFEXOR-XR) 150 MG 24 hr capsule TAKE 1 CAPSULE BY MOUTH DAILY WITH BREAKFAST 30 capsule 0   No current facility-administered medications for this visit.      Allergies:   Other   Social History   Social History  . Marital  status: Married    Spouse name: N/A  . Number of children: 1  . Years of education: College   Occupational History  . retired/disabled Unemployed   Social History Main Topics  . Smoking status: Current Every Day Smoker    Packs/day: 0.50    Types: Cigarettes  . Smokeless tobacco: Never Used     Comment: quit in 2012, started again in 2017  . Alcohol use No     Comment: holidays/special occas 3-4 drinks  . Drug use: No  . Sexual activity: Yes   Other Topics Concern  . None   Social History Narrative   Patient lives at home with her husband and son   Patient is right handed   Patient drinks coffee daily     Family History:  Family History  Problem Relation Age of Onset  . Diabetes Mother   . Heart disease Mother   . Hyperlipidemia Mother   . Hypertension Mother   . Heart disease Father   . Hyperlipidemia Father   . Hypertension Father   . Diabetes Maternal Grandmother   . Heart disease Maternal Grandmother   . Hyperlipidemia Maternal Grandmother   . Cancer Paternal Grandmother   . Cancer Sister   . Varicose Veins Sister   . Heart attack Sister     ROS:   Please see the history of present illness.  All other systems are reviewed and otherwise negative.    PHYSICAL EXAM:   VS:  BP 126/62 (BP Location: Left Arm)   Pulse 77   Ht _0  (1.626 m)   Wt 146 lb 1.9 oz (66.3 kg)   BMI 25.08 kg/m   BMI: Body mass index is 25.08 kg/m. GEN: Well nourished, well developed WF, in no acute distress  HEENT: normocephalic, atraumatic Neck: no JVD or masses. Bilateral carotid bruits noted. Bilateral CEA scars well healed Cardiac: RRR, soft SEM RUSB, no rubs or gallops, no edema  Respiratory:  clear to auscultation bilaterally, normal work of breathing GI: soft, nontender, nondistended, + BS MS: no deformity or atrophy  Skin: warm and dry, no rash Neuro:  Alert and Oriented x 3, Strength and sensation are intact, follows commands Psych: euthymic mood, full affect  Wt  Readings from Last 3 Encounters:  04/27/17 146 lb 1.9 oz (66.3 kg)  02/25/17 149 lb (67.6 kg)  01/19/17 147 lb (66.7 kg)      Studies/Labs Reviewed:   EKG:  EKG was ordered today and personally reviewed by me and demonstrates NSR 73bpm, TWI III, avF, nonspecific T wave flattening in II, V5-V6  Recent Labs: 02/25/2017: ALT 12; BUN 10; Creatinine, Ser 0.55; Platelets 266;  Potassium 5.2; Sodium 143   Lipid Panel    Component Value Date/Time   CHOL 284 (H) 02/25/2017 1020   TRIG 374 (H) 02/25/2017 1020   HDL 38 (L) 02/25/2017 1020   CHOLHDL 7.5 (H) 02/25/2017 1020   CHOLHDL 6.7 (H) 08/13/2015 1057   VLDL 62 (H) 08/13/2015 1057   LDLCALC 171 (H) 02/25/2017 1020    Additional studies/ records that were reviewed today include: Summarized above.    ASSESSMENT & PLAN:   1. Cardiovascular screening/abnormal EKG - at baseline patient has nonspecific EKG changes but is completely asymptomatic from angina standpoint. She does report rare "flip" in her heartbeat that sounds like either a benign PAC or PVC. She has a multitude of cardiac risk factors including ongoing tobacco abuse, significant vascular disease, uncontrolled DM and HLD. At this time Dr. Acie Fredrickson does not feel stress testing is yet warranted given that she is completely asymptomatic. We will start with a screening echocardiogram to further evaluate (also to eval her murmur). If there is evidence of LV dysfunction will need to proceed with ischemic evaluation. We also discussed importance of observation of progressive symptoms. Ultimately she needs very strict risk factor modification. We have strongly reiterated need to d/c tobacco - she understands she is at high risk of cardiac events if she continues. See below regarding statin. Decrease aspirin to 42m per BPA (concomitant Plavix therapy). 2. Systolic murmur - update 2D echocardiogram. 3. Essential HTN - controlled on present regimen. 4. Hyperlipidemia - recent LDL 171. She  states she was off meds at that time. Given her diabetes and vascular disease, guideline directed therapy warrants high dose statin. Will d/c simvastatin and start atorvastatin 864mqpm. F/u liver/lipids in 6 weeks. 5. PAD - followed by vascular surgery.   Disposition: F/u with me in 6 weeks. If echo unrevealing, may be able to defer f/u for 6 months depending on results and just keep lab appointment for statin.    Medication Adjustments/Labs and Tests Ordered: Current medicines are reviewed at length with the patient today.  Concerns regarding medicines are outlined above. Medication changes, Labs and Tests ordered today are summarized above and listed in the Patient Instructions accessible in Encounters.   SiRaechel AcheA-C  04/27/2017 2:33 PM    [facility] [address]  [id number]: [phone]: [phone]

## 2017-04-27 ENCOUNTER — Encounter: Payer: Self-pay | Admitting: Physician Assistant

## 2017-04-27 ENCOUNTER — Ambulatory Visit (INDEPENDENT_AMBULATORY_CARE_PROVIDER_SITE_OTHER): Payer: Medicare Other | Admitting: Physician Assistant

## 2017-04-27 VITALS — BP 126/62 | HR 77 | Ht 64.0 in | Wt 146.1 lb

## 2017-04-27 DIAGNOSIS — R9431 Abnormal electrocardiogram [ECG] [EKG]: Secondary | ICD-10-CM | POA: Diagnosis not present

## 2017-04-27 DIAGNOSIS — I739 Peripheral vascular disease, unspecified: Secondary | ICD-10-CM

## 2017-04-27 DIAGNOSIS — I10 Essential (primary) hypertension: Secondary | ICD-10-CM

## 2017-04-27 DIAGNOSIS — E785 Hyperlipidemia, unspecified: Secondary | ICD-10-CM | POA: Diagnosis not present

## 2017-04-27 DIAGNOSIS — R011 Cardiac murmur, unspecified: Secondary | ICD-10-CM | POA: Diagnosis not present

## 2017-04-27 DIAGNOSIS — Z136 Encounter for screening for cardiovascular disorders: Secondary | ICD-10-CM | POA: Diagnosis not present

## 2017-04-27 MED ORDER — ATORVASTATIN CALCIUM 80 MG PO TABS: 80.0000 mg | ORAL_TABLET | Freq: Every day | ORAL | 3 refills | Status: DC

## 2017-04-27 MED ORDER — ASPIRIN 81 MG PO TABS: 81.0000 mg | ORAL_TABLET | Freq: Every day | ORAL | 11 refills | Status: AC

## 2017-04-27 NOTE — Patient Instructions (Signed)
Medication Instructions:  Your physician has recommended you make the following change in your medication:  1.  STOP the Simvastatin 2.  START the Atorvastatin 80 mg daily 3.  STOP the Aspirin 325 mg  4.  START Aspirin 81 mg daily   Labwork: 6 WEEKS:  FASTING LIPID & LFT (AT TIME OF FOLLOW-UP APPT)  Testing/Procedures: Your physician has requested that you have an echocardiogram. Echocardiography is a painless test that uses sound waves to create images of your heart. It provides your doctor with information about the size and shape of your heart and how well your heart's chambers and valves are working. This procedure takes approximately one hour. There are no restrictions for this procedure.    Follow-Up: Your physician recommends that you schedule a follow-up appointment in: [place], PA-C   Any Other Special Instructions Will Be Listed Below (If Applicable).  Echocardiogram An echocardiogram, or echocardiography, uses sound waves (ultrasound) to produce an image of your heart. The echocardiogram is simple, painless, obtained within a short period of time, and offers valuable information to your health care provider. The images from an echocardiogram can provide information such as:  Evidence of coronary artery disease (CAD).  Heart size.  Heart muscle function.  Heart valve function.  Aneurysm detection.  Evidence of a past heart attack.  Fluid buildup around the heart.  Heart muscle thickening.  Assess heart valve function. Tell a health care provider about:  Any allergies you have.  All medicines you are taking, including vitamins, herbs, eye drops, creams, and over-the-counter medicines.  Any problems you or family members have had with anesthetic medicines.  Any blood disorders you have.  Any surgeries you have had.  Any medical conditions you have.  Whether you are pregnant or may be pregnant. What happens before the procedure? No  special preparation is needed. Eat and drink normally. What happens during the procedure?  In order to produce an image of your heart, gel will be applied to your chest and a wand-like tool (transducer) will be moved over your chest. The gel will help transmit the sound waves from the transducer. The sound waves will harmlessly bounce off your heart to allow the heart images to be captured in real-time motion. These images will then be recorded.  You may need an IV to receive a medicine that improves the quality of the pictures. What happens after the procedure? You may return to your normal schedule including diet, activities, and medicines, unless your health care provider tells you otherwise. This information is not intended to replace advice given to you by your health care provider. Make sure you discuss any questions you have with your health care provider. Document Released: 12/05/2000 Document Revised: 07/26/2016 Document Reviewed: 08/15/2013 Elsevier Interactive Patient Education  2017 [employer].    If you need a refill on your cardiac medications before your next appointment, please call your pharmacy.

## 2017-05-11 ENCOUNTER — Other Ambulatory Visit: Payer: Self-pay

## 2017-05-11 ENCOUNTER — Ambulatory Visit (HOSPITAL_COMMUNITY): Payer: Medicare Other | Attending: Cardiology

## 2017-05-11 DIAGNOSIS — E785 Hyperlipidemia, unspecified: Secondary | ICD-10-CM | POA: Diagnosis not present

## 2017-05-11 DIAGNOSIS — R9431 Abnormal electrocardiogram [ECG] [EKG]: Secondary | ICD-10-CM

## 2017-05-11 DIAGNOSIS — Z8673 Personal history of transient ischemic attack (TIA), and cerebral infarction without residual deficits: Secondary | ICD-10-CM | POA: Insufficient documentation

## 2017-05-11 DIAGNOSIS — Z720 Tobacco use: Secondary | ICD-10-CM | POA: Insufficient documentation

## 2017-05-11 DIAGNOSIS — E1151 Type 2 diabetes mellitus with diabetic peripheral angiopathy without gangrene: Secondary | ICD-10-CM | POA: Insufficient documentation

## 2017-05-11 DIAGNOSIS — I10 Essential (primary) hypertension: Secondary | ICD-10-CM | POA: Insufficient documentation

## 2017-05-11 DIAGNOSIS — I081 Rheumatic disorders of both mitral and tricuspid valves: Secondary | ICD-10-CM | POA: Insufficient documentation

## 2017-05-13 ENCOUNTER — Ambulatory Visit: Payer: Self-pay | Admitting: Family

## 2017-05-13 ENCOUNTER — Encounter (HOSPITAL_COMMUNITY): Payer: Self-pay

## 2017-06-03 ENCOUNTER — Ambulatory Visit: Payer: Medicare Other | Admitting: Physician Assistant

## 2017-06-08 ENCOUNTER — Ambulatory Visit: Payer: Medicare Other | Admitting: Physician Assistant

## 2017-06-08 ENCOUNTER — Other Ambulatory Visit: Payer: Medicare Other

## 2017-06-17 ENCOUNTER — Ambulatory Visit (INDEPENDENT_AMBULATORY_CARE_PROVIDER_SITE_OTHER): Payer: Medicare Other

## 2017-06-17 ENCOUNTER — Telehealth: Payer: Self-pay

## 2017-06-17 ENCOUNTER — Ambulatory Visit (INDEPENDENT_AMBULATORY_CARE_PROVIDER_SITE_OTHER): Payer: Medicare Other | Admitting: Physician Assistant

## 2017-06-17 ENCOUNTER — Encounter: Payer: Self-pay | Admitting: Physician Assistant

## 2017-06-17 VITALS — BP 136/71 | HR 63 | Temp 98.3°F | Resp 18 | Ht 63.62 in | Wt 144.4 lb

## 2017-06-17 DIAGNOSIS — R0781 Pleurodynia: Secondary | ICD-10-CM | POA: Diagnosis not present

## 2017-06-17 DIAGNOSIS — Z23 Encounter for immunization: Secondary | ICD-10-CM

## 2017-06-17 DIAGNOSIS — E119 Type 2 diabetes mellitus without complications: Secondary | ICD-10-CM | POA: Diagnosis not present

## 2017-06-17 DIAGNOSIS — E785 Hyperlipidemia, unspecified: Secondary | ICD-10-CM | POA: Diagnosis not present

## 2017-06-17 LAB — POCT GLYCOSYLATED HEMOGLOBIN (HGB A1C): Hemoglobin A1C: 6

## 2017-06-17 MED ORDER — LISINOPRIL 2.5 MG PO TABS: 2.5000 mg | ORAL_TABLET | Freq: Every day | ORAL | 1 refills | Status: DC

## 2017-06-17 MED ORDER — CYCLOBENZAPRINE HCL 5 MG PO TABS: 5.0000 mg | ORAL_TABLET | Freq: Three times a day (TID) | ORAL | 0 refills | Status: DC | PRN

## 2017-06-17 MED ORDER — METFORMIN HCL 1000 MG PO TABS: 1000.0000 mg | ORAL_TABLET | Freq: Two times a day (BID) | ORAL | 0 refills | Status: DC

## 2017-06-17 MED ORDER — GLIPIZIDE ER 10 MG PO TB24: 10.0000 mg | ORAL_TABLET | Freq: Every day | ORAL | 0 refills | Status: DC

## 2017-06-17 NOTE — Progress Notes (Signed)
MRN: [id number]  Subjective:   Andrea [name] is a 51 y.o. female who presents for follow up of Type 2 diabetes mellitus. Pt initially evaluated by me on 02/25/17 for all of her chronic disorders including HTN, HLD, T2DM, bilateral carotid artery stenosis, anxiety and depression. She had been out of her DM medication at this time and her A1C was 8.2. We discussed lifestyle modifications and she was informed to follow up in 3 months. Pt is fasting today.   Diagnosis was made 2008. Patient is currently managed with metformin 1062m BID and glipizide 10 mg daily. Will sometimes forget her second metformin dose. Denies adverse effects including metallic taste, hypoglycemia, nausea, vomiting.   Patient is not checking home blood sugars regularly. She has seen ranges of 98-146.  Current symptoms include none. Patient denies foot ulcerations, nausea, paresthesia of the feet, polydipsia, polyuria, visual disturbances, vomiting and weight loss. Patient is checking their feet daily. No foot concerns. Last diabetic eye exam eye exam was 1.5 years ago, it was normal.   She is confused about what to eat. She notes eats a variety of meats, vegetables, and fruits but would like a nutritionist. She sometimes only eats one meal a day. She has reduced the salt in her diet. Not adding salt to her diet. Drinking mostly water and coffee. Does not eat desserts frequently. Exercise includes walking daily. She is smoking 0.5 ppd.    Known diabetic complications: none  Immunizations:  pneumococal vaccine: never  Other concerns: She was evaluated by cardiology in 04/2017. EKG showed nonspecific changes. 2D Echo performed, which showed normal EF, LVH and grade 1 DD. Statin was changed from simvastatin to atorvastatin 873m Pt encouraged to restrict salt to 2g per day and not go over 64oz fluid per day. Plan for follow up with cardiology in 6 months.   She tripped and fell 2 weeks ago and landed on her right side. She  has continued to have pain in her right ribs. Notes most movements and deep breathing exacerbate the pain. Denies SOB, laceration, or cough. Has tried aleve with mild relief but is having trouble sleeping due to the pain.      Objective:   PHYSICAL EXAM BP 136/71 (BP Location: Right Arm, Patient Position: Sitting, Cuff Size: Normal)   Pulse 63   Temp 98.3 F (36.8 C) (Oral)   Resp 18   Ht 5' 3.62" (1.616 m)   Wt 144 lb 6.4 oz (65.5 kg)   SpO2 99%   BMI 25.08 kg/m   Physical Exam  Constitutional: She is oriented to person, place, and time. She appears well-developed and well-nourished. No distress.  HENT:  Head: Normocephalic and atraumatic.  Eyes: Conjunctivae are normal.  Neck: Normal range of motion.  Cardiovascular: Normal rate, regular rhythm and intact distal pulses.   Murmur heard.  Systolic murmur is present  Pulmonary/Chest: Effort normal and breath sounds normal. She has no decreased breath sounds. She exhibits bony tenderness.    Neurological: She is alert and oriented to person, place, and time.  Skin: Skin is warm and dry.  Psychiatric: She has a normal mood and affect.  Vitals reviewed.   Diabetic Foot Exam - Simple   Simple Foot Form Visual Inspection No deformities, no ulcerations, no other skin breakdown bilaterally:  Yes Sensation Testing Intact to touch and monofilament testing bilaterally:  Yes Pulse Check Posterior Tibialis and Dorsalis pulse intact bilaterally:  Yes Comments     Wt Readings from Last 3  Encounters:  06/17/17 144 lb 6.4 oz (65.5 kg)  04/27/17 146 lb 1.9 oz (66.3 kg)  02/25/17 149 lb (67.6 kg)    Results for orders placed or performed in visit on 06/17/17 (from the past 24 hour(s))  POCT glycosylated hemoglobin (Hb A1C)     Status: None   Collection Time: 06/17/17 11:03 AM  Result Value Ref Range   Hemoglobin A1C 6.0    Dg Ribs Unilateral W/chest Right  Result Date: 06/17/2017 CLINICAL DATA:  Fall 2 weeks prior with right  sided injury and pain. EXAM: RIGHT RIBS AND CHEST - 3+ VIEW COMPARISON:  06/22/2014 chest radiograph. FINDINGS: Stable cardiomediastinal silhouette with normal heart size and aortic atherosclerosis. No pneumothorax. No pleural effusion. Lungs appear clear, with no acute consolidative airspace disease and no pulmonary edema. The area of symptomatic concern as indicated by the patient in the lateral lower right chest wall was denoted with a metallic skin BB by the technologist. No fracture or suspicious focal osseous lesion is seen in the right ribs. Cholecystectomy clips are seen in the right upper quadrant of the abdomen. IMPRESSION: 1. No active cardiopulmonary disease. 2. No right rib fracture detected. 3. Aortic atherosclerosis. Electronically Signed   By: Ilona Sorrel M.D.   On: 06/17/2017 11:51    Assessment and Plan :  1. Type 2 diabetes mellitus without complication, without long-term current use of insulin (HCC) Well controlled. Placed referral for diabetic nutrition education. Plan for follow up in 3 months. If still this controlled can consider decreasing glipizide dose.  - HM Diabetes Foot Exam - CMP14+EGFR - POCT glycosylated hemoglobin (Hb A1C) - Ambulatory referral to diabetic education - Pneumococcal polysaccharide vaccine 23-valent greater than or equal to 2yo subcutaneous/IM - Hepatic Function Panel - Lipid panel - metFORMIN (GLUCOPHAGE) 1000 MG tablet; Take 1 tablet (1,000 mg total) by mouth 2 (two) times daily with a meal.  Dispense: 180 tablet; Refill: 0 - glipiZIDE (GLUCOTROL XL) 10 MG 24 hr tablet; Take 1 tablet (10 mg total) by mouth daily with breakfast.  Dispense: 90 tablet; Refill: 0 - lisinopril (PRINIVIL,ZESTRIL) 2.5 MG tablet; Take 1 tablet (2.5 mg total) by mouth daily.  Dispense: 90 tablet; Refill: 1  2. Hyperlipidemia, unspecified hyperlipidemia type Labs pending.  - Hepatic Function Panel - Lipid panel  3. Rib pain Plain film reassuring. Recommend icing  affected area 4-5 x day.  Return to clinic if symptoms worsen, do not improve, or as needed - DG Ribs Unilateral W/Chest Right; Future - cyclobenzaprine (FLEXERIL) 5 MG tablet; Take 1 tablet (5 mg total) by mouth 3 (three) times daily as needed for muscle spasms.  Dispense: 60 tablet; Refill: 0  4. Need for Td vaccine - Td vaccine greater than or equal to 7yo preservative free IM   Tenna Delaine, PA-C  [facility] [place] 06/17/2017 2:08 PM

## 2017-06-17 NOTE — Patient Instructions (Addendum)
For diabetes, continue working hard like you are! I have placed a referral for the nutritionist. They should contact you within the next couple weeks. Continue taking metformin and glipizide as prescribed. I also added on a medication called lisinopril that you should take to help protect your kidneys. It can lower your blood pressure so I started a very low dose. Monitor your blood pressure outside the office. Your goal is <140/90 and >100/60. Continue checking your blood sugars outside of office. Your fasting blood sugar goal is 70-100. If you A1C is still this well controlled at your follow up visit in 3 months (and I expect it will be!) then we can decrease glipizide dose. In the meantime, make sure you for your eye exam!   In terms of your rib pain, when you leave here, go upstairs to have an xray. I will call you with these results. I have given you a prescription for a muscle relaxer, which should help with the tightness and help you sleep. You can take up to 10 mg at night. This medication will make you drowsy so do not try to operate heavy machinery after taking it.    Diabetes Mellitus and Food It is important for you to manage your blood sugar (glucose) level. Your blood glucose level can be greatly affected by what you eat. Eating healthier foods in the appropriate amounts throughout the day at about the same time each day will help you control your blood glucose level. It can also help slow or prevent worsening of your diabetes mellitus. Healthy eating may even help you improve the level of your blood pressure and reach or maintain a healthy weight. General recommendations for healthful eating and cooking habits include:  Eating meals and snacks regularly. Avoid going long periods of time without eating to lose weight.  Eating a diet that consists mainly of plant-based foods, such as fruits, vegetables, nuts, legumes, and whole grains.  Using low-heat cooking methods, such as baking, instead  of high-heat cooking methods, such as deep frying.  Work with your dietitian to make sure you understand how to use the Nutrition Facts information on food labels. How can food affect me? Carbohydrates Carbohydrates affect your blood glucose level more than any other type of food. Your dietitian will help you determine how many carbohydrates to eat at each meal and teach you how to count carbohydrates. Counting carbohydrates is important to keep your blood glucose at a healthy level, especially if you are using insulin or taking certain medicines for diabetes mellitus. Alcohol Alcohol can cause sudden decreases in blood glucose (hypoglycemia), especially if you use insulin or take certain medicines for diabetes mellitus. Hypoglycemia can be a life-threatening condition. Symptoms of hypoglycemia (sleepiness, dizziness, and disorientation) are similar to symptoms of having too much alcohol. If your health care provider has given you approval to drink alcohol, do so in moderation and use the following guidelines:  Women should not have more than one drink per day, and men should not have more than two drinks per day. One drink is equal to: ? 12 oz of beer. ? 5 oz of wine. ? 1 oz of hard liquor.  Do not drink on an empty stomach.  Keep yourself hydrated. Have water, diet soda, or unsweetened iced tea.  Regular soda, juice, and other mixers might contain a lot of carbohydrates and should be counted.  What foods are not recommended? As you make food choices, it is important to remember that all foods  are not the same. Some foods have fewer nutrients per serving than other foods, even though they might have the same number of calories or carbohydrates. It is difficult to get your body what it needs when you eat foods with fewer nutrients. Examples of foods that you should avoid that are high in calories and carbohydrates but low in nutrients include:  Trans fats (most processed foods list trans  fats on the Nutrition Facts label).  Regular soda.  Juice.  Candy.  Sweets, such as cake, pie, doughnuts, and cookies.  Fried foods.  What foods can I eat? Eat nutrient-rich foods, which will nourish your body and keep you healthy. The food you should eat also will depend on several factors, including:  The calories you need.  The medicines you take.  Your weight.  Your blood glucose level.  Your blood pressure level.  Your cholesterol level.  You should eat a variety of foods, including:  Protein. ? Lean cuts of meat. ? Proteins low in saturated fats, such as fish, egg whites, and beans. Avoid processed meats.  Fruits and vegetables. ? Fruits and vegetables that may help control blood glucose levels, such as apples, mangoes, and yams.  Dairy products. ? Choose fat-free or low-fat dairy products, such as milk, yogurt, and cheese.  Grains, bread, pasta, and rice. ? Choose whole grain products, such as multigrain bread, whole oats, and brown rice. These foods may help control blood pressure.  Fats. ? Foods containing healthful fats, such as nuts, avocado, olive oil, canola oil, and fish.  Does everyone with diabetes mellitus have the same meal plan? Because every person with diabetes mellitus is different, there is not one meal plan that works for everyone. It is very important that you meet with a dietitian who will help you create a meal plan that is just right for you. This information is not intended to replace advice given to you by your health care provider. Make sure you discuss any questions you have with your health care provider. Document Released: 09/04/2005 Document Revised: 05/15/2016 Document Reviewed: 11/04/2013 Elsevier Interactive Patient Education  2017 [place] Heart-healthy meal planning includes:  Limiting unhealthy fats.  Increasing healthy fats.  Making other small dietary changes.  You may need to  talk with your doctor or a diet specialist (dietitian) to create an eating plan that is right for you. What types of fat should I choose?  Choose healthy fats. These include olive oil and canola oil, flaxseeds, walnuts, almonds, and seeds.  Eat more omega-3 fats. These include salmon, mackerel, sardines, tuna, flaxseed oil, and ground flaxseeds. Try to eat fish at least twice each week.  Limit saturated fats. ? Saturated fats are often found in animal products, such as meats, butter, and cream. ? Plant sources of saturated fats include palm oil, palm kernel oil, and coconut oil.  Avoid foods with partially hydrogenated oils in them. These include stick margarine, some tub margarines, cookies, crackers, and other baked goods. These contain trans fats. What general guidelines do I need to follow?  Check food labels carefully. Identify foods with trans fats or high amounts of saturated fat.  Fill one half of your plate with vegetables and green salads. Eat 4-5 servings of vegetables per day. A serving of vegetables is: ? 1 cup of raw leafy vegetables. ?  cup of raw or cooked cut-up vegetables. ?  cup of vegetable juice.  Fill one fourth of your plate with whole  grains. Look for the word "whole" as the first word in the ingredient list.  Fill one fourth of your plate with lean protein foods.  Eat 4-5 servings of fruit per day. A serving of fruit is: ? One medium whole fruit. ?  cup of dried fruit. ?  cup of fresh, frozen, or canned fruit. ?  cup of 100% fruit juice.  Eat more foods that contain soluble fiber. These include apples, broccoli, carrots, beans, peas, and barley. Try to get 20-30 g of fiber per day.  Eat more home-cooked food. Eat less restaurant, buffet, and fast food.  Limit or avoid alcohol.  Limit foods high in starch and sugar.  Avoid fried foods.  Avoid frying your food. Try baking, boiling, grilling, or broiling it instead. You can also reduce fat  by: ? Removing the skin from poultry. ? Removing all visible fats from meats. ? Skimming the fat off of stews, soups, and gravies before serving them. ? Steaming vegetables in water or broth.  Lose weight if you are overweight.  Eat 4-5 servings of nuts, legumes, and seeds per week: ? One serving of dried beans or legumes equals  cup after being cooked. ? One serving of nuts equals 1 ounces. ? One serving of seeds equals  ounce or one tablespoon.  You may need to keep track of how much salt or sodium you eat. This is especially true if you have high blood pressure. Talk with your doctor or dietitian to get more information. What foods can I eat? Grains Breads, including [place], white, pita, wheat, raisin, rye, oatmeal, and [place]. Tortillas that are neither fried nor made with lard or trans fat. Low-fat rolls, including hotdog and hamburger buns and English muffins. Biscuits. Muffins. Waffles. Pancakes. Light popcorn. Whole-grain cereals. Flatbread. Melba toast. Pretzels. Breadsticks. Rusks. Low-fat snacks. Low-fat crackers, including oyster, saltine, matzo, graham, animal, and rye. Rice and pasta, including brown rice and pastas that are made with whole wheat. Vegetables All vegetables. Fruits All fruits, but limit coconut. Meats and Other Protein Sources Lean, well-trimmed beef, veal, pork, and lamb. Chicken and [place] without skin. All fish and shellfish. Wild duck, rabbit, pheasant, and venison. Egg whites or low-cholesterol egg substitutes. Dried beans, peas, lentils, and tofu. Seeds and most nuts. Dairy Low-fat or nonfat cheeses, including ricotta, string, and mozzarella. Skim or 1% milk that is liquid, powdered, or evaporated. Buttermilk that is made with low-fat milk. Nonfat or low-fat yogurt. Beverages Mineral water. Diet carbonated beverages. Sweets and Desserts Sherbets and fruit ices. Honey, jam, marmalade, jelly, and syrups. Meringues and gelatins. Pure sugar candy, such  as hard candy, jelly beans, gumdrops, mints, marshmallows, and small amounts of dark chocolate. [employer] Eat all sweets and desserts in moderation. Fats and Oils Nonhydrogenated (trans-free) margarines. Vegetable oils, including soybean, sesame, sunflower, olive, peanut, safflower, corn, canola, and cottonseed. Salad dressings or mayonnaise made with a vegetable oil. Limit added fats and oils that you use for cooking, baking, salads, and as spreads. Other Cocoa powder. Coffee and tea. All seasonings and condiments. The items listed above may not be a complete list of recommended foods or beverages. Contact your dietitian for more options. What foods are not recommended? Grains Breads that are made with saturated or trans fats, oils, or whole milk. Croissants. Butter rolls. Cheese breads. Sweet rolls. Donuts. Buttered popcorn. Chow mein noodles. High-fat crackers, such as cheese or butter crackers. Meats and Other Protein Sources Fatty meats, such as hotdogs, short ribs, sausage, spareribs,  bacon, rib eye roast or steak, and mutton. High-fat deli meats, such as salami and bologna. Caviar. Domestic duck and goose. Organ meats, such as kidney, liver, sweetbreads, and heart. Dairy Cream, sour cream, cream cheese, and creamed cottage cheese. Whole-milk cheeses, including blue (bleu), Monterey Jack, [place], [place], American, [place], Swiss, cheddar, [place], and Buffalo. Whole or 2% milk that is liquid, evaporated, or condensed. Whole buttermilk. Cream sauce or high-fat cheese sauce. Yogurt that is made from whole milk. Beverages Regular sodas and juice drinks with added sugar. Sweets and Desserts Frosting. Pudding. Cookies. Cakes other than angel food cake. Candy that has milk chocolate or white chocolate, hydrogenated fat, butter, coconut, or unknown ingredients. Buttered syrups. Full-fat ice cream or ice cream drinks. Fats and Oils Gravy that has suet, meat fat, or shortening. Cocoa butter,  hydrogenated oils, palm oil, coconut oil, palm kernel oil. These can often be found in baked products, candy, fried foods, nondairy creamers, and whipped toppings. Solid fats and shortenings, including bacon fat, salt pork, lard, and butter. Nondairy cream substitutes, such as coffee creamers and sour cream substitutes. Salad dressings that are made of unknown oils, cheese, or sour cream. The items listed above may not be a complete list of foods and beverages to avoid. Contact your dietitian for more information. This information is not intended to replace advice given to you by your health care provider. Make sure you discuss any questions you have with your health care provider. Document Released: 06/08/2012 Document Revised: 05/15/2016 Document Reviewed: 06/01/2014 Elsevier Interactive Patient Education  2018 [employer].  IF you received an x-ray today, you will receive an invoice from [facility]. Please contact [facility] at [phone] with questions or concerns regarding your invoice.   IF you received labwork today, you will receive an invoice from [facility]. Please contact [facility] at [phone] with questions or concerns regarding your invoice.   Our billing staff will not be able to assist you with questions regarding bills from these companies.  You will be contacted with the lab results as soon as they are available. The fastest way to get your results is to activate your My Chart account. Instructions are located on the last page of this paperwork. If you have not heard from [place] regarding the results in 2 weeks, please contact this office.     We recommend that you schedule a mammogram for breast cancer screening. Typically, you do not need a referral to do this. Please contact a local imaging center to schedule your mammogram.  [facility] - [phone]  *ask for the Radiology Department [facility] - [phone] or  [phone]  [facility] - [phone] [place] [phone] [facility] - [phone]  *ask for the [facility] - [phone]  *ask for the Radiology Department [facility] - [phone]  *ask for the [facility] - [phone]

## 2017-06-18 ENCOUNTER — Telehealth: Payer: Self-pay | Admitting: Physician Assistant

## 2017-06-18 LAB — HEPATIC FUNCTION PANEL
ALK PHOS: 137 IU/L — AB (ref 39–117)
ALT: 11 IU/L (ref 0–32)
AST: 16 IU/L (ref 0–40)
Albumin: 4.9 g/dL (ref 3.5–5.5)
Bilirubin Total: 0.3 mg/dL (ref 0.0–1.2)
Bilirubin, Direct: 0.09 mg/dL (ref 0.00–0.40)
Total Protein: 7.7 g/dL (ref 6.0–8.5)

## 2017-06-18 LAB — LIPID PANEL
CHOLESTEROL TOTAL: 152 mg/dL (ref 100–199)
Chol/HDL Ratio: 4.5 ratio — ABNORMAL HIGH (ref 0.0–4.4)
HDL: 34 mg/dL — AB (ref >39)
LDL Calculated: 73 mg/dL (ref 0–99)
Triglycerides: 225 mg/dL — ABNORMAL HIGH (ref 0–149)
VLDL Cholesterol Cal: 45 mg/dL — ABNORMAL HIGH (ref 5–40)

## 2017-06-18 LAB — CMP14+EGFR
A/G RATIO: 1.5 (ref 1.2–2.2)
ALBUMIN: 4.8 g/dL (ref 3.5–5.5)
ALT: 10 IU/L (ref 0–32)
AST: 14 IU/L (ref 0–40)
Alkaline Phosphatase: 141 IU/L — ABNORMAL HIGH (ref 39–117)
BUN/Creatinine Ratio: 13 (ref 9–23)
BUN: 7 mg/dL (ref 6–24)
Bilirubin Total: 0.3 mg/dL (ref 0.0–1.2)
CALCIUM: 10.2 mg/dL (ref 8.7–10.2)
CO2: 22 mmol/L (ref 20–29)
Chloride: 100 mmol/L (ref 96–106)
Creatinine, Ser: 0.56 mg/dL — ABNORMAL LOW (ref 0.57–1.00)
GFR calc Af Amer: 123 mL/min/{1.73_m2} (ref >59)
GFR, EST NON AFRICAN AMERICAN: 107 mL/min/{1.73_m2} (ref >59)
GLOBULIN, TOTAL: 3.3 g/dL (ref 1.5–4.5)
Glucose: 89 mg/dL (ref 65–99)
POTASSIUM: 4.2 mmol/L (ref 3.5–5.2)
SODIUM: 143 mmol/L (ref 134–144)
Total Protein: 8.1 g/dL (ref 6.0–8.5)

## 2017-06-18 NOTE — Telephone Encounter (Signed)
Pt had a question about her BP medication, she would like you to give her a call.  Contact number [phone]

## 2017-06-19 ENCOUNTER — Encounter: Payer: Self-pay | Admitting: Physician Assistant

## 2017-06-22 ENCOUNTER — Other Ambulatory Visit: Payer: Self-pay | Admitting: Physician Assistant

## 2017-06-22 ENCOUNTER — Encounter: Payer: Self-pay | Admitting: Physician Assistant

## 2017-06-22 DIAGNOSIS — R748 Abnormal levels of other serum enzymes: Secondary | ICD-10-CM

## 2017-06-22 NOTE — Progress Notes (Signed)
Orders Placed This Encounter  Procedures  . CMP14+EGFR    Standing Status:   Future    Standing Expiration Date:   08/10/2017  . Gamma GT    Standing Status:   Future    Standing Expiration Date:   08/10/2017    

## 2017-06-23 NOTE — Telephone Encounter (Signed)
Pt notified by [place] via [place].

## 2017-07-02 ENCOUNTER — Ambulatory Visit: Payer: Medicare Other | Admitting: Dietician

## 2017-07-02 NOTE — Telephone Encounter (Signed)
Done/complete

## 2017-07-14 ENCOUNTER — Encounter: Payer: Self-pay | Admitting: Family

## 2017-07-14 ENCOUNTER — Encounter: Payer: Self-pay | Admitting: Physician Assistant

## 2017-07-21 ENCOUNTER — Ambulatory Visit (INDEPENDENT_AMBULATORY_CARE_PROVIDER_SITE_OTHER): Payer: Medicare Other | Admitting: Physician Assistant

## 2017-07-21 ENCOUNTER — Ambulatory Visit (INDEPENDENT_AMBULATORY_CARE_PROVIDER_SITE_OTHER): Payer: Medicare Other

## 2017-07-21 ENCOUNTER — Encounter: Payer: Self-pay | Admitting: Physician Assistant

## 2017-07-21 VITALS — BP 120/67 | HR 90 | Temp 98.3°F | Resp 18 | Ht 63.19 in | Wt 141.4 lb

## 2017-07-21 DIAGNOSIS — R0981 Nasal congestion: Secondary | ICD-10-CM

## 2017-07-21 DIAGNOSIS — R059 Cough, unspecified: Secondary | ICD-10-CM

## 2017-07-21 DIAGNOSIS — R05 Cough: Secondary | ICD-10-CM

## 2017-07-21 DIAGNOSIS — J069 Acute upper respiratory infection, unspecified: Secondary | ICD-10-CM

## 2017-07-21 DIAGNOSIS — J029 Acute pharyngitis, unspecified: Secondary | ICD-10-CM

## 2017-07-21 LAB — POCT RAPID STREP A (OFFICE): Rapid Strep A Screen: NEGATIVE

## 2017-07-21 MED ORDER — PSEUDOEPHEDRINE HCL ER 120 MG PO TB12: 120.0000 mg | ORAL_TABLET | Freq: Two times a day (BID) | ORAL | 0 refills | Status: DC

## 2017-07-21 MED ORDER — HYDROCODONE-HOMATROPINE 5-1.5 MG/5ML PO SYRP: 5.0000 mL | ORAL_SOLUTION | Freq: Three times a day (TID) | ORAL | 0 refills | Status: DC | PRN

## 2017-07-21 MED ORDER — AZITHROMYCIN 250 MG PO TABS: ORAL_TABLET | ORAL | 0 refills | Status: DC

## 2017-07-21 MED ORDER — FLUTICASONE PROPIONATE 50 MCG/ACT NA SUSP: 2.0000 | Freq: Every day | NASAL | 0 refills | Status: DC

## 2017-07-21 MED ORDER — BENZONATATE 100 MG PO CAPS: 100.0000 mg | ORAL_CAPSULE | Freq: Three times a day (TID) | ORAL | 0 refills | Status: DC | PRN

## 2017-07-21 MED ORDER — CETIRIZINE HCL 10 MG PO TABS: 10.0000 mg | ORAL_TABLET | Freq: Every day | ORAL | 0 refills | Status: DC

## 2017-07-21 NOTE — Patient Instructions (Addendum)
-   Your rapid strep test came back negative. Your xray showed chronic bronchitis with no active pneumonia. I therefore suggest we will treat this as a respiratory viral infection.  - I recommend you rest, drink plenty of fluids, eat light meals including soups.  - You may use sudafed, flonase, and zyrtec for sinus and head congestion.  - You may use cough syrup at night for your cough and sore throat, Tessalon pearls during the day. Be aware that cough syrup can definitely make you drowsy and sleepy so do not drive or operate any heavy machinery if it is affecting you during the day.  - You may also use Tylenol or ibuprofen over-the-counter for your sore throat.  - If no improvement in 3-5 days, I have given you a Rx for a zpack to pick up and take. Please let me know if symptoms persist or worsen. Thank you for letting me participate in your health and well being.     IF you received an x-ray today, you will receive an invoice from [facility]. Please contact [facility] at [phone] with questions or concerns regarding your invoice.   IF you received labwork today, you will receive an invoice from [facility]. Please contact [facility] at [phone] with questions or concerns regarding your invoice.   Our billing staff will not be able to assist you with questions regarding bills from these companies.  You will be contacted with the lab results as soon as they are available. The fastest way to get your results is to activate your My Chart account. Instructions are located on the last page of this paperwork. If you have not heard from [place] regarding the results in 2 weeks, please contact this office.

## 2017-07-21 NOTE — Progress Notes (Signed)
MRN: [id number] DOB: Mar 17, 1966  Subjective:   Andrea [name] is a 51 y.o. female presenting for chief complaint of Sore Throat (X 2 days); Headache (X 2  days); and Cough (X 2 days) .  Reports 3 day history of sinus headache, sinus congestion, sinus pain, rhinorrhea, ear fullness, sore throat and productive cough (no hemoptysis), chills. Has tried honey with lemon, green tea, and salt water gargles with no relief. Denies fever, wheezing, shortness of breath, chest pain and myalgia, nausea, vomiting, abdominal pain and diarrhea. Has had sick contact with niece who has strep throat. No history of seasonal allergies, no history of asthma. Current every day smoker, smokes about a third of a pack a day. Denies any other aggravating or relieving factors, no other questions or concerns.  Andrea [name] has a current medication list which includes the following prescription(s): alprazolam, amlodipine, aspirin, atorvastatin, clopidogrel, glipizide, lancets, lisinopril, metformin, venlafaxine xr, and cyclobenzaprine. Also is allergic to other.  Andrea [name]  has a past medical history of Allergy; Anemia; Anxiety; Carotid artery occlusion; Depression; Diabetes mellitus without complication ([place]); Heart murmur; Hypertension; Peripheral vascular disease ([place]); PVD (peripheral vascular disease) ([place]); and TIA (transient ischemic attack). Also  has a past surgical history that includes Cholecystectomy; Cesarean section; Tubal ligation; Femoral bypass; left/right carotid artery; 2 stents; and Carotid endarterectomy.   Objective:   Vitals: BP 120/67 (BP Location: Right Arm, Patient Position: Sitting, Cuff Size: Normal)   Pulse 90   Temp 98.3 F (36.8 C) (Oral)   Resp 18   Ht 5' 3.19" (1.605 m)   Wt 141 lb 6.4 oz (64.1 kg)   SpO2 96%   BMI 24.90 kg/m   Physical Exam  Constitutional: She is oriented to person, place, and time. She appears well-developed and well-nourished.  HENT:  Head: Normocephalic and  atraumatic.  Right Ear: External ear and ear canal normal. Tympanic membrane is retracted.  Left Ear: External ear and ear canal normal. Tympanic membrane is retracted.  Nose: Mucosal edema (moderate bilaterally) and rhinorrhea (mild ) present. Right sinus exhibits no maxillary sinus tenderness and no frontal sinus tenderness. Left sinus exhibits no maxillary sinus tenderness and no frontal sinus tenderness.  Mouth/Throat: Uvula is midline and mucous membranes are normal. Posterior oropharyngeal erythema present. Tonsils are 2+ on the right. Tonsils are 2+ on the left. No tonsillar exudate.  Eyes: Conjunctivae are normal.  Neck: Normal range of motion.  Cardiovascular: Normal rate and regular rhythm.   Murmur heard.  Systolic murmur is present  Pulmonary/Chest: Effort normal. She has no decreased breath sounds. She has no wheezes. She has rhonchi in the left upper field. She has no rales.  Neurological: She is alert and oriented to person, place, and time.  Skin: Skin is warm and dry.  Psychiatric: She has a normal mood and affect.  Vitals reviewed.   Results for orders placed or performed in visit on 07/21/17 (from the past 24 hour(s))  POCT rapid strep A     Status: None   Collection Time: 07/21/17  2:22 PM  Result Value Ref Range   Rapid Strep A Screen Negative Negative   Dg Chest 2 View  Result Date: 07/21/2017 CLINICAL DATA:  Three days of cough. Abnormal breath sounds on the left posteriorly. EXAM: CHEST  2 VIEW COMPARISON:  Chest x-ray of June 17, 2017 FINDINGS: The lungs are well-expanded. The interstitial markings are coarse but stable. There is no alveolar infiltrate or pleural effusion. The heart and pulmonary vascularity are  normal. The mediastinum is normal in width. There is calcification in the wall of the aortic arch. The bony thorax is unremarkable. IMPRESSION: Stable chronic bronchitic changes.  No alveolar pneumonia nor CHF. Thoracic aortic atherosclerosis. Electronically  Signed   By: David  [place] M.D.   On: 07/21/2017 14:54    Assessment and Plan :  1. Sore throat - POCT rapid strep A 2. Cough - DG Chest 2 View; Future 3. Sinus congestion 4. Acute upper respiratory infection Rapid strep negative. CXR shows stable chronic bronchitc changes. Vitals are stable. This is likely a viral etiology. Will treat symptomatically. Pt given a printed Rx for a zpack to fill if she has no improvement in cough in 3-5 days or if cough worsens.  - pseudoephedrine (SUDAFED 12 HOUR) 120 MG 12 hr tablet; Take 1 tablet (120 mg total) by mouth 2 (two) times daily.  Dispense: 20 tablet; Refill: 0 - fluticasone (FLONASE) 50 MCG/ACT nasal spray; Place 2 sprays into both nostrils daily.  Dispense: 16 g; Refill: 0 - cetirizine (ZYRTEC) 10 MG tablet; Take 1 tablet (10 mg total) by mouth daily.  Dispense: 30 tablet; Refill: 0 - benzonatate (TESSALON) 100 MG capsule; Take 1-2 capsules (100-200 mg total) by mouth 3 (three) times daily as needed for cough.  Dispense: 40 capsule; Refill: 0 - HYDROcodone-homatropine (HYCODAN) 5-1.5 MG/5ML syrup; Take 5 mLs by mouth every 8 (eight) hours as needed for cough.  Dispense: 120 mL; Refill: 0 - azithromycin (ZITHROMAX) 250 MG tablet; Take 2 tabs PO x 1 dose, then 1 tab PO QD x 4 days  Dispense: 6 tablet; Refill: 0  Tenna Delaine, PA-C  [facility] [place] 07/22/2017 11:47 AM

## 2017-07-27 ENCOUNTER — Ambulatory Visit: Payer: Self-pay | Admitting: Family

## 2017-07-27 ENCOUNTER — Ambulatory Visit: Payer: Medicare Other | Admitting: Dietician

## 2017-07-27 ENCOUNTER — Encounter (HOSPITAL_COMMUNITY): Payer: Self-pay

## 2017-08-03 ENCOUNTER — Ambulatory Visit (HOSPITAL_COMMUNITY)
Admission: RE | Admit: 2017-08-03 | Discharge: 2017-08-03 | Disposition: A | Discharge status: Home / Self Care | Payer: Medicare Other | Source: Ambulatory Visit | Attending: Surgery | Admitting: Surgery

## 2017-08-03 ENCOUNTER — Ambulatory Visit (INDEPENDENT_AMBULATORY_CARE_PROVIDER_SITE_OTHER): Payer: Medicare Other | Admitting: Family

## 2017-08-03 ENCOUNTER — Ambulatory Visit (INDEPENDENT_AMBULATORY_CARE_PROVIDER_SITE_OTHER)
Admission: RE | Admit: 2017-08-03 | Discharge: 2017-08-03 | Disposition: A | Discharge status: Home / Self Care | Payer: Medicare Other | Source: Ambulatory Visit | Attending: Family | Admitting: Family

## 2017-08-03 ENCOUNTER — Encounter: Payer: Self-pay | Admitting: Family

## 2017-08-03 VITALS — BP 122/70 | HR 68 | Temp 98.8°F | Resp 16 | Ht 63.0 in | Wt 137.0 lb

## 2017-08-03 DIAGNOSIS — Z95828 Presence of other vascular implants and grafts: Secondary | ICD-10-CM | POA: Diagnosis not present

## 2017-08-03 DIAGNOSIS — I6521 Occlusion and stenosis of right carotid artery: Secondary | ICD-10-CM | POA: Diagnosis not present

## 2017-08-03 DIAGNOSIS — Z9889 Other specified postprocedural states: Secondary | ICD-10-CM

## 2017-08-03 DIAGNOSIS — I6522 Occlusion and stenosis of left carotid artery: Secondary | ICD-10-CM

## 2017-08-03 DIAGNOSIS — I743 Embolism and thrombosis of arteries of the lower extremities: Secondary | ICD-10-CM | POA: Diagnosis not present

## 2017-08-03 DIAGNOSIS — I779 Disorder of arteries and arterioles, unspecified: Secondary | ICD-10-CM | POA: Insufficient documentation

## 2017-08-03 DIAGNOSIS — F17200 Nicotine dependence, unspecified, uncomplicated: Secondary | ICD-10-CM | POA: Diagnosis not present

## 2017-08-03 LAB — VAS US CAROTID
LCCADDIAS: -27 cm/s
LCCADSYS: -87 cm/s
LCCAPDIAS: 20 cm/s
LEFT ECA DIAS: -11 cm/s
LEFT VERTEBRAL DIAS: 8 cm/s
LICAPDIAS: -50 cm/s
LICAPSYS: -349 cm/s
Left CCA prox sys: 93 cm/s
Left ICA dist dias: -33 cm/s
Left ICA dist sys: -113 cm/s
RIGHT ECA DIAS: -7 cm/s
RIGHT VERTEBRAL DIAS: 16 cm/s

## 2017-08-03 NOTE — Progress Notes (Signed)
[facility] [place] HISTORY AND PHYSICAL   MRN : [id number]  History of Present Illness:   Andrea [name] is a 51 y.o. female patient of Dr. Scot Dock who has undergone previous bilateral carotid endarterectomies by Dr. Drucie Opitz and also a femorofemoral bypass graft by Dr. Amedeo Plenty. She had described some left-sided weakness in July 2015 which was felt to be related to hypotension. She has a known right internal carotid artery occlusion. She had mild disease on the left.  She denies any subsequent history of stroke, TIAs, expressive or receptive aphasia, or amaurosis fugax. She denies significant claudication or rest pain.  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  She walks 1-2 miles daily.   She denies any history of MI or cardiac stents, states she does have heart murmur.   Pt Diabetic: Yes, A1C on 06-17-17 was 6.0 (review of records) Pt smoker: current smoker, quit in 2012, smoked for about 30 years, resumed in December 2016, decreased to 1/3 ppd.   Pt meds include: Statin :Yes Betablocker: No ASA: Yes Other anticoagulants/antiplatelets: Plavix   Current Outpatient Prescriptions  Medication Sig Dispense Refill  . ALPRAZolam (XANAX) 0.25 MG tablet Take 1 tablet (0.25 mg total) by mouth as needed for anxiety. 20 tablet 0  . amLODipine (NORVASC) 10 MG tablet Take 1 tablet (10 mg total) by mouth daily. 90 tablet 1  . aspirin 81 MG tablet Take 1 tablet (81 mg total) by mouth daily. 30 tablet 11  . cetirizine (ZYRTEC) 10 MG tablet Take 1 tablet (10 mg total) by mouth daily. 30 tablet 0  . clopidogrel (PLAVIX) 75 MG tablet Take 1 tablet (75 mg total) by mouth daily. 90 tablet 1  . fluticasone (FLONASE) 50 MCG/ACT nasal spray Place 2 sprays into both nostrils daily. 16 g 0  . glipiZIDE (GLUCOTROL XL) 10 MG 24 hr tablet Take 1 tablet (10 mg total) by mouth daily with breakfast. 90 tablet 0  . Lancets MISC Test blood sugar once  daily. 100 each 2  . lisinopril (PRINIVIL,ZESTRIL) 2.5 MG tablet Take 1 tablet (2.5 mg total) by mouth daily. 90 tablet 1  . metFORMIN (GLUCOPHAGE) 1000 MG tablet Take 1 tablet (1,000 mg total) by mouth 2 (two) times daily with a meal. 180 tablet 0  . venlafaxine XR (EFFEXOR-XR) 150 MG 24 hr capsule TAKE 1 CAPSULE BY MOUTH DAILY WITH BREAKFAST 30 capsule 0  . atorvastatin (LIPITOR) 80 MG tablet Take 1 tablet (80 mg total) by mouth daily. 90 tablet 3   No current facility-administered medications for this visit.     Past Medical History:  Diagnosis Date  . Allergy   . Anemia   . Anxiety    on Alprazolam  . Carotid artery occlusion    a. s/p prior endarterectomies with known RICA occlusion - followed by [facility].  . Depression    on Effexor  . Diabetes mellitus without complication ([place])    4098  . Heart murmur    was [facility] by cardiologist in [facility] 2013  . Hypertension   . Peripheral vascular disease ([place])   . PVD (peripheral vascular disease) ([place])    a. s/p prior LE bypass grafting and revision (followed by [facility]).  Marland Kitchen TIA (transient ischemic attack)    a. possible mini stroke in 2015 - was told she did not have full stroke.    Social History Social History  Substance Use Topics  . Smoking  status: Current Every Day Smoker    Packs/day: 0.50    Types: Cigarettes  . Smokeless tobacco: Never Used     Comment: 1 pack will last 3 days.  . Alcohol use 0.0 oz/week     Comment: holidays/special occas 3-4 drinks    Family History Family History  Problem Relation Age of Onset  . Diabetes Mother   . Heart disease Mother   . Hyperlipidemia Mother   . Hypertension Mother   . Heart disease Father        MI, unknown age  . Hyperlipidemia Father   . Hypertension Father   . Diabetes Maternal Grandmother   . Heart disease Maternal Grandmother   . Hyperlipidemia Maternal Grandmother   . Cancer Paternal Grandmother   . Cancer Sister   . Varicose Veins Sister   . Heart attack  Brother        Age 43    Surgical History Past Surgical History:  Procedure Laterality Date  . 2 stents    . CAROTID ENDARTERECTOMY    . CESAREAN SECTION    . CHOLECYSTECTOMY    . FEMORAL BYPASS     x 2 on right side due to re-stenosis  . left/right carotid artery     2003 and 2009 for right, right side is 100% occluded per patient; left side 2009  . TUBAL LIGATION      Allergies  Allergen Reactions  . Other Hives    Contrast dye     Current Outpatient Prescriptions  Medication Sig Dispense Refill  . ALPRAZolam (XANAX) 0.25 MG tablet Take 1 tablet (0.25 mg total) by mouth as needed for anxiety. 20 tablet 0  . amLODipine (NORVASC) 10 MG tablet Take 1 tablet (10 mg total) by mouth daily. 90 tablet 1  . aspirin 81 MG tablet Take 1 tablet (81 mg total) by mouth daily. 30 tablet 11  . cetirizine (ZYRTEC) 10 MG tablet Take 1 tablet (10 mg total) by mouth daily. 30 tablet 0  . clopidogrel (PLAVIX) 75 MG tablet Take 1 tablet (75 mg total) by mouth daily. 90 tablet 1  . fluticasone (FLONASE) 50 MCG/ACT nasal spray Place 2 sprays into both nostrils daily. 16 g 0  . glipiZIDE (GLUCOTROL XL) 10 MG 24 hr tablet Take 1 tablet (10 mg total) by mouth daily with breakfast. 90 tablet 0  . Lancets MISC Test blood sugar once daily. 100 each 2  . lisinopril (PRINIVIL,ZESTRIL) 2.5 MG tablet Take 1 tablet (2.5 mg total) by mouth daily. 90 tablet 1  . metFORMIN (GLUCOPHAGE) 1000 MG tablet Take 1 tablet (1,000 mg total) by mouth 2 (two) times daily with a meal. 180 tablet 0  . venlafaxine XR (EFFEXOR-XR) 150 MG 24 hr capsule TAKE 1 CAPSULE BY MOUTH DAILY WITH BREAKFAST 30 capsule 0  . atorvastatin (LIPITOR) 80 MG tablet Take 1 tablet (80 mg total) by mouth daily. 90 tablet 3   No current facility-administered medications for this visit.      REVIEW OF SYSTEMS: See HPI for pertinent positives and negatives.  Physical Examination Vitals:   08/03/17 1418 08/03/17 1419  BP: 131/73 122/70  Pulse:  68   Resp: 16   Temp: 98.8 F (37.1 C)   TempSrc: Oral   SpO2: 97%   Weight: 137 lb (62.1 kg)   Height: 5\' 3"  (1.6 m)    Body mass index is 24.27 kg/m.  General:  WDWN in NAD Gait: Normal HENT: WNL Eyes: Pupils equal Pulmonary: normal  non-labored breathing, no rales, rhonchi, or wheezing, adequate air movement in all fields. Cardiac: RRR,  +murmur   Abdomen: soft, NT, no masses palpated Skin: no rashes, no ulcers, no cellulitis.   VASCULAR EXAM  Carotid Bruits Right Left   Positive Positive    Abdominal aortic pulse is mildly palpable Femoral to femoral graft bypass has a palpable pulse.   VASCULAR EXAM: Extremitieswithout ischemic changes, without Gangrene; without open wounds.     LE Pulses Right Left   FEMORAL 2+ palpable faintly palpable    POPLITEAL not palpable  not palpable   POSTERIOR TIBIAL 1+ palpable   1+palpable    DORSALIS PEDIS  ANTERIOR TIBIAL 2+ palpable  2+ palpable     Musculoskeletal: no muscle wasting or atrophy; no peripheral edema Neurologic:A&O X 3; appropriate affect; normal sensation, MOTOR FUNCTION: 5/5 Symmetric, CN 2-12 intact, speech is fluent/normal.      ASSESSMENT:  Andrea [name] is a 51 y.o. female who has undergone previous bilateral carotid endarterectomies by Dr. Drucie Opitz and also a femorofemoral bypass graft by Dr. Amedeo Plenty. She had described some left-sided weakness in July 2015 which was felt to be related to hypotension. She has a known right internal carotid artery occlusion. She has moderate disease on the left ICA.  She denies any subsequent history of stroke, TIAs, expressive or receptive aphasia, or amaurosis fugax. She denies claudication sx's with walking; she walks 1-2 miles daily.   Pedal pulses are palpable. Her left femoral pulse is faintly palpable, right is 2+ palpable; fem-fem bypass graft is palpable. Decline in bilateral ABI with no claudication symptoms; will evaluate fem-fem bypass graft with duplex on her return in 6 months.   Fortunately her DM remains in good control, but unfortunately she resumed smoking, expressed a desire to quit.   DATA  Carotid Duplex (08/03/17): Known occlusion of the right internal and carotid common carotid artery. Right external carotid artery stenosis fed by branch. Left carotid endarterectomy site with restenosis at 40-59% by EDV. Bilateral vertebral artery flow is antegrade.  Bilateral subclavian artery waveforms are normal.  No signifcantchange since exams on 04-24-16 and 01-19-17.   DATA  ABI (Date: 08/03/2017)  R:   ABI: 0.82 (was 0.98 on 05-14-16),   PT: tri  DP: tri  TBI:  0.82 (was 0.81)  L:   ABI: 0.85 (was 0.92),   PT: not documented, appear tri or biphasic  DP: tri  TBI: 0.93 (was 0.73)  Bilateral ABI declined: right from 98% to 82%; left from 92% to 85%. Bilateral TBI remain normal.   Plan:  The patient was counseled re smoking cessation and given several free resources re smoking cessation.  Continue walking 1-2 miles daily.   Follow-up in 6 months with Carotid Duplex scan, ABI's, and fem-fem bypass graft duplex.   I discussed in depth with the patient the nature of atherosclerosis, and emphasized the importance of maximal medical management including strict control of blood pressure, blood glucose, and lipid levels, obtaining regular exercise, and cessation of smoking.  The patient is aware that without maximal medical management the underlying atherosclerotic disease process will progress, limiting the benefit of any interventions.  The patient was given information about stroke prevention and what symptoms should prompt the patient to seek immediate medical care.  The patient  was given information about PAD including signs, symptoms, treatment, what symptoms should prompt the patient to seek immediate medical care, and risk reduction measures to take.  Thank you for allowing [place]  to participate in this patient's care.  Clemon Chambers, RN, MSN, FNP-C [facility] Office: [phone]  Clinic MD: Trula Slade 08/03/2017 2:32 PM

## 2017-08-03 NOTE — Patient Instructions (Addendum)
Before your next abdominal ultrasound:  Take two Extra-Strength Gas-X capsules at bedtime the night before the test. Take another two Extra-Strength Gas-X capsules 3 hours before the test.  Avoid gas forming foods the day before the test.       Steps to Quit Smoking Smoking tobacco can be bad for your health. It can also affect almost every organ in your body. Smoking puts you and people around you at risk for many serious long-lasting (chronic) diseases. Quitting smoking is hard, but it is one of the best things that you can do for your health. It is never too late to quit. What are the benefits of quitting smoking? When you quit smoking, you lower your risk for getting serious diseases and conditions. They can include:  Lung cancer or lung disease.  Heart disease.  Stroke.  Heart attack.  Not being able to have children (infertility).  Weak bones (osteoporosis) and broken bones (fractures).  If you have coughing, wheezing, and shortness of breath, those symptoms may get better when you quit. You may also get sick less often. If you are pregnant, quitting smoking can help to lower your chances of having a baby of low birth weight. What can I do to help me quit smoking? Talk with your doctor about what can help you quit smoking. Some things you can do (strategies) include:  Quitting smoking totally, instead of slowly cutting back how much you smoke over a period of time.  Going to in-person counseling. You are more likely to quit if you go to many counseling sessions.  Using resources and support systems, such as: ? Database administrator with a Social worker. ? Phone quitlines. ? Careers information officer. ? Support groups or group counseling. ? Text messaging programs. ? Mobile phone apps or applications.  Taking medicines. Some of these medicines may have nicotine in them. If you are pregnant or breastfeeding, do not take any medicines to quit smoking unless your doctor says it is  okay. Talk with your doctor about counseling or other things that can help you.  Talk with your doctor about using more than one strategy at the same time, such as taking medicines while you are also going to in-person counseling. This can help make quitting easier. What things can I do to make it easier to quit? Quitting smoking might feel very hard at first, but there is a lot that you can do to make it easier. Take these steps:  Talk to your family and friends. Ask them to support and encourage you.  Call phone quitlines, reach out to support groups, or work with a Social worker.  Ask people who smoke to not smoke around you.  Avoid places that make you want (trigger) to smoke, such as: ? Bars. ? Parties. ? Smoke-break areas at work.  Spend time with people who do not smoke.  Lower the stress in your life. Stress can make you want to smoke. Try these things to help your stress: ? Getting regular exercise. ? Deep-breathing exercises. ? Yoga. ? Meditating. ? Doing a body scan. To do this, close your eyes, focus on one area of your body at a time from head to toe, and notice which parts of your body are tense. Try to relax the muscles in those areas.  Download or buy apps on your mobile phone or tablet that can help you stick to your quit plan. There are many free apps, such as QuitGuide from the [employer] Office manager for Disease Control and  Prevention). You can find more support from smokefree.gov and other websites.  This information is not intended to replace advice given to you by your health care provider. Make sure you discuss any questions you have with your health care provider. Document Released: 10/04/2009 Document Revised: 08/05/2016 Document Reviewed: 04/24/2015 Elsevier Interactive Patient Education  2018 [employer].     Stroke Prevention Some medical conditions and behaviors are associated with an increased chance of having a stroke. You may prevent a stroke by making healthy  choices and managing medical conditions. How can I reduce my risk of having a stroke?  Stay physically active. Get at least 30 minutes of activity on most or all days.  Do not smoke. It may also be helpful to avoid exposure to secondhand smoke.  Limit alcohol use. Moderate alcohol use is considered to be: ? No more than 2 drinks per day for men. ? No more than 1 drink per day for nonpregnant women.  Eat healthy foods. This involves: ? Eating 5 or more servings of fruits and vegetables a day. ? Making dietary changes that address high blood pressure (hypertension), high cholesterol, diabetes, or obesity.  Manage your cholesterol levels. ? Making food choices that are high in fiber and low in saturated fat, trans fat, and cholesterol may control cholesterol levels. ? Take any prescribed medicines to control cholesterol as directed by your health care provider.  Manage your diabetes. ? Controlling your carbohydrate and sugar intake is recommended to manage diabetes. ? Take any prescribed medicines to control diabetes as directed by your health care provider.  Control your hypertension. ? Making food choices that are low in salt (sodium), saturated fat, trans fat, and cholesterol is recommended to manage hypertension. ? Ask your health care provider if you need treatment to lower your blood pressure. Take any prescribed medicines to control hypertension as directed by your health care provider. ? If you are 74-2 years of age, have your blood pressure checked every 3-5 years. If you are 62 years of age or older, have your blood pressure checked every year.  Maintain a healthy weight. ? Reducing calorie intake and making food choices that are low in sodium, saturated fat, trans fat, and cholesterol are recommended to manage weight.  Stop drug abuse.  Avoid taking birth control pills. ? Talk to your health care provider about the risks of taking birth control pills if you are over 60  years old, smoke, get migraines, or have ever had a blood clot.  Get evaluated for sleep disorders (sleep apnea). ? Talk to your health care provider about getting a sleep evaluation if you snore a lot or have excessive sleepiness.  Take medicines only as directed by your health care provider. ? For some people, aspirin or blood thinners (anticoagulants) are helpful in reducing the risk of forming abnormal blood clots that can lead to stroke. If you have the irregular heart rhythm of atrial fibrillation, you should be on a blood thinner unless there is a good reason you cannot take them. ? Understand all your medicine instructions.  Make sure that other conditions (such as anemia or atherosclerosis) are addressed. Get help right away if:  You have sudden weakness or numbness of the face, arm, or leg, especially on one side of the body.  Your face or eyelid droops to one side.  You have sudden confusion.  You have trouble speaking (aphasia) or understanding.  You have sudden trouble seeing in one or both eyes.  You have sudden trouble walking.  You have dizziness.  You have a loss of balance or coordination.  You have a sudden, severe headache with no known cause.  You have new chest pain or an irregular heartbeat. Any of these symptoms may represent a serious problem that is an emergency. Do not wait to see if the symptoms will go away. Get medical help at once. Call your local emergency services (911 in [place] Do not drive yourself to the hospital. This information is not intended to replace advice given to you by your health care provider. Make sure you discuss any questions you have with your health care provider. Document Released: 01/15/2005 Document Revised: 05/15/2016 Document Reviewed: 06/10/2013 Elsevier Interactive Patient Education  2017 [place].      Peripheral Vascular Disease Peripheral vascular disease (PVD) is a disease of the blood vessels that are not  part of your heart and brain. A simple term for PVD is poor circulation. In most cases, PVD narrows the blood vessels that carry blood from your heart to the rest of your body. This can result in a decreased supply of blood to your arms, legs, and internal organs, like your stomach or kidneys. However, it most often affects a person's lower legs and feet. There are two types of PVD.  Organic PVD. This is the more common type. It is caused by damage to the structure of blood vessels.  Functional PVD. This is caused by conditions that make blood vessels contract and tighten (spasm).  Without treatment, PVD tends to get worse over time. PVD can also lead to acute ischemic limb. This is when an arm or limb suddenly has trouble getting enough blood. This is a medical emergency. Follow these instructions at home:  Take medicines only as told by your doctor.  Do not use any tobacco products, including cigarettes, chewing tobacco, or electronic cigarettes. If you need help quitting, ask your doctor.  Lose weight if you are overweight, and maintain a healthy weight as told by your doctor.  Eat a diet that is low in fat and cholesterol. If you need help, ask your doctor.  Exercise regularly. Ask your doctor for some good activities for you.  Take good care of your feet. ? Wear comfortable shoes that fit well. ? Check your feet often for any cuts or sores. Contact a doctor if:  You have cramps in your legs while walking.  You have leg pain when you are at rest.  You have coldness in a leg or foot.  Your skin changes.  You are unable to get or have an erection (erectile dysfunction).  You have cuts or sores on your feet that are not healing. Get help right away if:  Your arm or leg turns cold and blue.  Your arms or legs become red, warm, swollen, painful, or numb.  You have chest pain or trouble breathing.  You suddenly have weakness in your face, arm, or leg.  You become very  confused or you cannot speak.  You suddenly have a very bad headache.  You suddenly cannot see. This information is not intended to replace advice given to you by your health care provider. Make sure you discuss any questions you have with your health care provider. Document Released: 03/04/2010 Document Revised: 05/15/2016 Document Reviewed: 05/18/2014 Elsevier Interactive Patient Education  2017 [employer].

## 2017-08-07 NOTE — Addendum Note (Signed)
Addended by: Lianne Cure A on: 08/07/2017 08:53 AM   Modules accepted: Orders

## 2017-08-17 ENCOUNTER — Other Ambulatory Visit: Payer: Self-pay | Admitting: Physician Assistant

## 2017-08-17 DIAGNOSIS — R748 Abnormal levels of other serum enzymes: Secondary | ICD-10-CM

## 2017-08-17 NOTE — Progress Notes (Signed)
Orders Placed This Encounter  Procedures  . Hepatic Function Panel    Standing Status:   Future    Standing Expiration Date:   09/17/2017  . Gamma GT    Standing Status:   Future    Standing Expiration Date:   09/17/2017

## 2017-11-09 ENCOUNTER — Other Ambulatory Visit: Payer: Self-pay | Admitting: Physician Assistant

## 2017-11-09 DIAGNOSIS — I6523 Occlusion and stenosis of bilateral carotid arteries: Secondary | ICD-10-CM

## 2017-11-09 DIAGNOSIS — I10 Essential (primary) hypertension: Secondary | ICD-10-CM

## 2018-02-15 ENCOUNTER — Encounter (HOSPITAL_COMMUNITY): Payer: Medicare Other

## 2018-02-15 ENCOUNTER — Ambulatory Visit: Payer: Medicare Other | Admitting: Family

## 2018-03-11 ENCOUNTER — Encounter (HOSPITAL_COMMUNITY): Payer: Self-pay | Admitting: Nurse Practitioner

## 2018-03-11 ENCOUNTER — Ambulatory Visit (HOSPITAL_COMMUNITY)
Admission: RE | Admit: 2018-03-11 | Discharge: 2018-03-11 | Disposition: A | Discharge status: Home / Self Care | Payer: Medicare Other | Source: Ambulatory Visit | Attending: Physician Assistant | Admitting: Physician Assistant

## 2018-03-11 ENCOUNTER — Other Ambulatory Visit: Payer: Self-pay

## 2018-03-11 ENCOUNTER — Ambulatory Visit: Payer: Medicare Other | Admitting: Physician Assistant

## 2018-03-11 ENCOUNTER — Encounter: Payer: Self-pay | Admitting: Physician Assistant

## 2018-03-11 ENCOUNTER — Emergency Department (HOSPITAL_COMMUNITY)
Admission: EM | Admit: 2018-03-11 | Discharge: 2018-03-12 | Disposition: A | Discharge status: Home / Self Care | Payer: Medicare Other | Attending: Emergency Medicine | Admitting: Emergency Medicine

## 2018-03-11 ENCOUNTER — Ambulatory Visit (INDEPENDENT_AMBULATORY_CARE_PROVIDER_SITE_OTHER): Payer: Medicare Other

## 2018-03-11 ENCOUNTER — Other Ambulatory Visit (HOSPITAL_COMMUNITY)
Admission: RE | Admit: 2018-03-11 | Discharge: 2018-03-11 | Disposition: A | Discharge status: Home / Self Care | Payer: Medicare Other | Attending: *Deleted | Admitting: *Deleted

## 2018-03-11 VITALS — BP 147/74 | HR 80 | Temp 98.2°F | Resp 18 | Ht 63.58 in | Wt 134.6 lb

## 2018-03-11 DIAGNOSIS — Z7902 Long term (current) use of antithrombotics/antiplatelets: Secondary | ICD-10-CM | POA: Diagnosis not present

## 2018-03-11 DIAGNOSIS — E119 Type 2 diabetes mellitus without complications: Secondary | ICD-10-CM | POA: Diagnosis not present

## 2018-03-11 DIAGNOSIS — I700 Atherosclerosis of aorta: Secondary | ICD-10-CM

## 2018-03-11 DIAGNOSIS — I10 Essential (primary) hypertension: Secondary | ICD-10-CM | POA: Insufficient documentation

## 2018-03-11 DIAGNOSIS — N200 Calculus of kidney: Secondary | ICD-10-CM | POA: Insufficient documentation

## 2018-03-11 DIAGNOSIS — I2510 Atherosclerotic heart disease of native coronary artery without angina pectoris: Secondary | ICD-10-CM | POA: Diagnosis not present

## 2018-03-11 DIAGNOSIS — M5440 Lumbago with sciatica, unspecified side: Secondary | ICD-10-CM

## 2018-03-11 DIAGNOSIS — Z7982 Long term (current) use of aspirin: Secondary | ICD-10-CM | POA: Insufficient documentation

## 2018-03-11 DIAGNOSIS — R1032 Left lower quadrant pain: Secondary | ICD-10-CM | POA: Insufficient documentation

## 2018-03-11 DIAGNOSIS — R319 Hematuria, unspecified: Secondary | ICD-10-CM | POA: Diagnosis not present

## 2018-03-11 DIAGNOSIS — M545 Low back pain, unspecified: Secondary | ICD-10-CM

## 2018-03-11 DIAGNOSIS — I708 Atherosclerosis of other arteries: Secondary | ICD-10-CM | POA: Diagnosis not present

## 2018-03-11 DIAGNOSIS — N201 Calculus of ureter: Secondary | ICD-10-CM

## 2018-03-11 DIAGNOSIS — R1012 Left upper quadrant pain: Secondary | ICD-10-CM | POA: Diagnosis present

## 2018-03-11 DIAGNOSIS — F17210 Nicotine dependence, cigarettes, uncomplicated: Secondary | ICD-10-CM | POA: Diagnosis not present

## 2018-03-11 DIAGNOSIS — Z79899 Other long term (current) drug therapy: Secondary | ICD-10-CM | POA: Insufficient documentation

## 2018-03-11 DIAGNOSIS — Z7984 Long term (current) use of oral hypoglycemic drugs: Secondary | ICD-10-CM | POA: Diagnosis not present

## 2018-03-11 DIAGNOSIS — N139 Obstructive and reflux uropathy, unspecified: Secondary | ICD-10-CM | POA: Diagnosis not present

## 2018-03-11 LAB — BASIC METABOLIC PANEL
Anion gap: 8 (ref 5–15)
BUN: 8 mg/dL (ref 6–20)
CALCIUM: 9.7 mg/dL (ref 8.9–10.3)
CHLORIDE: 104 mmol/L (ref 101–111)
CO2: 30 mmol/L (ref 22–32)
CREATININE: 0.55 mg/dL (ref 0.44–1.00)
GFR calc Af Amer: >60 mL/min (ref >60)
GFR calc non Af Amer: >60 mL/min (ref >60)
GLUCOSE: 150 mg/dL — AB (ref 65–99)
Potassium: 4 mmol/L (ref 3.5–5.1)
Sodium: 142 mmol/L (ref 135–145)

## 2018-03-11 LAB — COMPREHENSIVE METABOLIC PANEL
ALBUMIN: 4.2 g/dL (ref 3.5–5.0)
ALT: 11 U/L — ABNORMAL LOW (ref 14–54)
ANION GAP: 10 (ref 5–15)
AST: 20 U/L (ref 15–41)
Alkaline Phosphatase: 91 U/L (ref 38–126)
BILIRUBIN TOTAL: 0.8 mg/dL (ref 0.3–1.2)
BUN: 9 mg/dL (ref 6–20)
CALCIUM: 10 mg/dL (ref 8.9–10.3)
CO2: 29 mmol/L (ref 22–32)
Chloride: 101 mmol/L (ref 101–111)
Creatinine, Ser: 0.6 mg/dL (ref 0.44–1.00)
GFR calc non Af Amer: >60 mL/min (ref >60)
GLUCOSE: 104 mg/dL — AB (ref 65–99)
POTASSIUM: 4 mmol/L (ref 3.5–5.1)
SODIUM: 140 mmol/L (ref 135–145)
TOTAL PROTEIN: 7.9 g/dL (ref 6.5–8.1)

## 2018-03-11 LAB — URINALYSIS, ROUTINE W REFLEX MICROSCOPIC
Bilirubin Urine: NEGATIVE
Glucose, UA: NEGATIVE mg/dL
KETONES UR: NEGATIVE mg/dL
LEUKOCYTES UA: NEGATIVE
Nitrite: NEGATIVE
PH: 7 (ref 5.0–8.0)
Protein, ur: NEGATIVE mg/dL
SPECIFIC GRAVITY, URINE: 1.002 — AB (ref 1.005–1.030)
SQUAMOUS EPITHELIAL / LPF: NONE SEEN

## 2018-03-11 LAB — CBC WITH DIFFERENTIAL/PLATELET
Basophils Absolute: 0.1 10*3/uL (ref 0.0–0.1)
Basophils Relative: 0 %
Eosinophils Absolute: 0.2 10*3/uL (ref 0.0–0.7)
Eosinophils Relative: 2 %
HEMATOCRIT: 42 % (ref 36.0–46.0)
Hemoglobin: 13.7 g/dL (ref 12.0–15.0)
LYMPHS ABS: 5 10*3/uL — AB (ref 0.7–4.0)
Lymphocytes Relative: 41 %
MCH: 30 pg (ref 26.0–34.0)
MCHC: 32.6 g/dL (ref 30.0–36.0)
MCV: 92.1 fL (ref 78.0–100.0)
MONO ABS: 1.2 10*3/uL — AB (ref 0.1–1.0)
Monocytes Relative: 10 %
NEUTROS ABS: 5.7 10*3/uL (ref 1.7–7.7)
Neutrophils Relative %: 47 %
Platelets: 272 10*3/uL (ref 150–400)
RBC: 4.56 MIL/uL (ref 3.87–5.11)
RDW: 14.6 % (ref 11.5–15.5)
WBC: 12.2 10*3/uL — ABNORMAL HIGH (ref 4.0–10.5)

## 2018-03-11 LAB — POCT CBC
Granulocyte percent: 59.5 %G (ref 37–80)
HCT, POC: 44 % (ref 37.7–47.9)
HEMOGLOBIN: 14.7 g/dL (ref 12.2–16.2)
LYMPH, POC: 4.2 — AB (ref 0.6–3.4)
MCH, POC: 30.1 pg (ref 27–31.2)
MCHC: 33.4 g/dL (ref 31.8–35.4)
MCV: 90.1 fL (ref 80–97)
MID (CBC): 1.2 — AB (ref 0–0.9)
MPV: 8.5 fL (ref 0–99.8)
PLATELET COUNT, POC: 277 10*3/uL (ref 142–424)
POC Granulocyte: 8 — AB (ref 2–6.9)
POC LYMPH PERCENT: 31.4 %L (ref 10–50)
POC MID %: 9.1 % (ref 0–12)
RBC: 4.89 M/uL (ref 4.04–5.48)
RDW, POC: 14.6 %
WBC: 13.5 10*3/uL — AB (ref 4.6–10.2)

## 2018-03-11 LAB — POCT URINALYSIS DIP (MANUAL ENTRY)
BILIRUBIN UA: NEGATIVE
Glucose, UA: NEGATIVE mg/dL
Ketones, POC UA: NEGATIVE mg/dL
LEUKOCYTES UA: NEGATIVE
Nitrite, UA: NEGATIVE
PROTEIN UA: NEGATIVE mg/dL
Spec Grav, UA: <=1.005 — AB (ref 1.010–1.025)
UROBILINOGEN UA: 0.2 U/dL
pH, UA: 5.5 (ref 5.0–8.0)

## 2018-03-11 LAB — POC MICROSCOPIC URINALYSIS (UMFC): MUCUS RE: ABSENT

## 2018-03-11 MED ORDER — OXYCODONE-ACETAMINOPHEN 5-325 MG PO TABS: 1.0000 | ORAL_TABLET | Freq: Four times a day (QID) | ORAL | 0 refills | Status: DC | PRN

## 2018-03-11 MED ORDER — OXYCODONE-ACETAMINOPHEN 5-325 MG PO TABS
1.0000 | ORAL_TABLET | Freq: Once | ORAL | Status: AC
  Administered 2018-03-11: 1 via ORAL
  Filled 2018-03-11: qty 1

## 2018-03-11 MED ORDER — TAMSULOSIN HCL 0.4 MG PO CAPS: 0.4000 mg | ORAL_CAPSULE | Freq: Every day | ORAL | 0 refills | Status: DC

## 2018-03-11 MED ORDER — ONDANSETRON HCL 4 MG PO TABS: 4.0000 mg | ORAL_TABLET | Freq: Three times a day (TID) | ORAL | 0 refills | Status: DC | PRN

## 2018-03-11 NOTE — Progress Notes (Signed)
Andrea [name]  MRN: [id number] DOB: 06-04-1966  Subjective:  Andrea [name] is a 52 y.o. female seen in office today for a chief complaint of hematuria. Has associated low back pain x 1 week. Starting having left lower quadrant pain x 1 day ago. Rates it as a 6/10. Describes it as a constant dull pain. Pain from back does not radiate to abdomen pain. Has associated nausea. Feels "blah." Denies flank pain, vomiting, dysuria, urinary frequency, and urgency. Pain is steady. Nothing exacerbates it. Aleve makes it better temporarily. No acute hx. Smokes 0.5 ppd. Drinks lots of tea and coffee, not much water at all. No PMH of kidney stones or diverticulitis. Has not eaten today.   Review of Systems  Constitutional: Negative for chills, diaphoresis and fever.  Respiratory: Negative for cough and shortness of breath.   Cardiovascular: Negative for chest pain, palpitations and leg swelling.  Genitourinary: Negative for menstrual problem.  Neurological: Negative for dizziness, light-headedness and headaches.    Patient Active Problem List   Diagnosis Date Noted  . Shingles 06/07/2016  . Bilateral carotid artery stenosis 10/18/2014  . TIA (transient ischemic attack) 08/29/2014  . HLD (hyperlipidemia) 08/29/2014  . DM (diabetes mellitus) with complications ([place]) 94/70/9628  . HTN (hypertension), benign 08/29/2014  . Stroke ([place]) 06/22/2014  . CVA (cerebral infarction) 06/22/2014  . PVD (peripheral vascular disease) ([place]) 04/12/2014  . Occlusion and stenosis of carotid artery without mention of cerebral infarction 04/12/2014    Current Outpatient Medications on File Prior to Visit  Medication Sig Dispense Refill  . amLODipine (NORVASC) 10 MG tablet TAKE 1 TABLET BY MOUTH ONCE DAILY 90 tablet 1  . aspirin 81 MG tablet Take 1 tablet (81 mg total) by mouth daily. 30 tablet 11  . clopidogrel (PLAVIX) 75 MG tablet TAKE 1 TABLET BY MOUTH ONCE DAILY 90 tablet 1  . fluticasone (FLONASE) 50  MCG/ACT nasal spray Place 2 sprays into both nostrils daily. 16 g 0  . Lancets MISC Test blood sugar once daily. 100 each 2  . metFORMIN (GLUCOPHAGE) 1000 MG tablet Take 1 tablet (1,000 mg total) by mouth 2 (two) times daily with a meal. 180 tablet 0  . venlafaxine XR (EFFEXOR-XR) 150 MG 24 hr capsule TAKE 1 CAPSULE BY MOUTH DAILY WITH BREAKFAST 30 capsule 0  . atorvastatin (LIPITOR) 80 MG tablet Take 1 tablet (80 mg total) by mouth daily. 90 tablet 3   No current facility-administered medications on file prior to visit.     Allergies  Allergen Reactions  . Other Hives    Contrast dye      Social History   Socioeconomic History  . Marital status: Married    Spouse name: Not on file  . Number of children: 1  . Years of education: College  . Highest education level: Not on file  Occupational History  . Occupation: retired/disabled    Fish farm manager: UNEMPLOYED  Social Needs  . Financial resource strain: Not on file  . Food insecurity:    Worry: Not on file    Inability: Not on file  . Transportation needs:    Medical: Not on file    Non-medical: Not on file  Tobacco Use  . Smoking status: Current Every Day Smoker    Packs/day: 0.50    Types: Cigarettes  . Smokeless tobacco: Never Used  . Tobacco comment: 1 pack will last 3 days.  Substance and Sexual Activity  . Alcohol use: Yes    Alcohol/week: 0.0 oz  Comment: holidays/special occas 3-4 drinks  . Drug use: No  . Sexual activity: Yes  Lifestyle  . Physical activity:    Days per week: Not on file    Minutes per session: Not on file  . Stress: Not on file  Relationships  . Social connections:    Talks on phone: Not on file    Gets together: Not on file    Attends religious service: Not on file    Active member of club or organization: Not on file    Attends meetings of clubs or organizations: Not on file    Relationship status: Not on file  . Intimate partner violence:    Fear of current or ex partner: Not on  file    Emotionally abused: Not on file    Physically abused: Not on file    Forced sexual activity: Not on file  Other Topics Concern  . Not on file  Social History Narrative   Patient lives at home with her husband and son   Patient is right handed   Patient drinks coffee daily    Objective:  BP (!) 147/74   Pulse 80   Temp 98.2 F (36.8 C) (Oral)   Resp 18   Ht 5' 3.58" (1.615 m)   Wt 134 lb 9.6 oz (61.1 kg)   SpO2 98%   BMI 23.41 kg/m   Physical Exam  Constitutional: She is oriented to person, place, and time.  Well-developed, well-nourished female, appears like she does not feel well sitting on exam chair.  HENT:  Head: Normocephalic and atraumatic.  Eyes: Conjunctivae are normal.  Neck: Normal range of motion.  Pulmonary/Chest: Effort normal.  Abdominal: Soft. Normal appearance and bowel sounds are normal. There is tenderness (moderate) in the left lower quadrant. There is guarding (with palpation of LLQ). There is no rigidity, no rebound, no CVA tenderness, no tenderness at McBurney's point and negative Murphy's sign.  Musculoskeletal:       Lumbar back: She exhibits normal range of motion, no tenderness and no bony tenderness.  Neurological: She is alert and oriented to person, place, and time. She has a normal Straight Leg Raise Test. Gait normal.  Reflex Scores:      Patellar reflexes are 2+ on the right side and 2+ on the left side.      Achilles reflexes are 2+ on the right side and 2+ on the left side. Muscular strength 5/5 of lower extremities bilaterally. Sensation of bilateral lower extremities intact.   Skin: Skin is warm and dry.  Psychiatric: Affect normal.  Vitals reviewed.   No results found for this or any previous visit (from the past 24 hour(s)). BP Readings from Last 3 Encounters:  03/11/18 131/63  03/11/18 (!) 147/74  08/03/17 122/70   Wt Readings from Last 3 Encounters:  03/11/18 134 lb 9.6 oz (61.1 kg)  08/03/17 137 lb (62.1 kg)    07/21/17 141 lb 6.4 oz (64.1 kg)   Results for orders placed or performed in visit on 03/11/18 (from the past 72 hour(s))  POCT urinalysis dipstick     Status: Abnormal   Collection Time: 03/11/18  1:55 PM  Result Value Ref Range   Color, UA brown (A) yellow   Clarity, UA cloudy (A) clear   Glucose, UA negative negative mg/dL   Bilirubin, UA negative negative   Ketones, POC UA negative negative mg/dL   Spec Grav, UA <=1.005 (A) 1.010 - 1.025   Blood, UA large (A)  negative   pH, UA 5.5 5.0 - 8.0   Protein Ur, POC negative negative mg/dL   Urobilinogen, UA 0.2 0.2 or 1.0 E.U./dL   Nitrite, UA Negative Negative   Leukocytes, UA Negative Negative  POCT CBC     Status: Abnormal   Collection Time: 03/11/18  2:54 PM  Result Value Ref Range   WBC 13.5 (A) 4.6 - 10.2 K/uL   Lymph, poc 4.2 (A) 0.6 - 3.4   POC LYMPH PERCENT 31.4 10 - 50 %L   MID (cbc) 1.2 (A) 0 - 0.9   POC MID % 9.1 0 - 12 %M   POC Granulocyte 8.0 (A) 2 - 6.9   Granulocyte percent 59.5 37 - 80 %G   RBC 4.89 4.04 - 5.48 M/uL   Hemoglobin 14.7 12.2 - 16.2 g/dL   HCT, POC 44.0 37.7 - 47.9 %   MCV 90.1 80 - 97 fL   MCH, POC 30.1 27 - 31.2 pg   MCHC 33.4 31.8 - 35.4 g/dL   RDW, POC 14.6 %   Platelet Count, POC 277 142 - 424 K/uL   MPV 8.5 0 - 99.8 fL  CMP14+EGFR     Status: Abnormal   Collection Time: 03/11/18  3:01 PM  Result Value Ref Range   Glucose 86 65 - 99 mg/dL   BUN 9 6 - 24 mg/dL   Creatinine, Ser 0.65 0.57 - 1.00 mg/dL   GFR calc non Af Amer 101 >59 mL/min/1.73   GFR calc Af Amer 116 >59 mL/min/1.73   BUN/Creatinine Ratio 14 9 - 23   Sodium 142 134 - 144 mmol/L   Potassium 4.6 3.5 - 5.2 mmol/L   Chloride 102 96 - 106 mmol/L   CO2 26 20 - 29 mmol/L   Calcium 10.5 (H) 8.7 - 10.2 mg/dL   Total Protein 7.4 6.0 - 8.5 g/dL   Albumin 4.6 3.5 - 5.5 g/dL   Globulin, Total 2.8 1.5 - 4.5 g/dL   Albumin/Globulin Ratio 1.6 1.2 - 2.2   Bilirubin Total 0.3 0.0 - 1.2 mg/dL   Alkaline Phosphatase 104 39 - 117 IU/L    AST 14 0 - 40 IU/L   ALT 8 0 - 32 IU/L  Urine Microscopic     Status: Abnormal   Collection Time: 03/11/18  3:01 PM  Result Value Ref Range   WBC, UA 0-5 0 - 5 /hpf   RBC, UA 3-10 (A) 0 - 2 /hpf   Epithelial Cells (non renal) 0-10 0 - 10 /hpf   Casts None seen None seen /lpf   Mucus, UA Present Not Estab.   Bacteria, UA Few None seen/Few  POCT Microscopic Urinalysis ([facility])     Status: Abnormal   Collection Time: 03/11/18  3:53 PM  Result Value Ref Range   WBC,UR,HPF,POC None None WBC/hpf   RBC,UR,HPF,POC Many (A) None RBC/hpf   Bacteria None None, Too numerous to count   Mucus Absent Absent   Epithelial Cells, UR Per Microscopy Few (A) None, Too numerous to count cells/hpf   Ct Abdomen Pelvis Wo Contrast  Result Date: 03/11/2018 CLINICAL DATA:  52 year old female with left lower quadrant abdominal pain and gross hematuria. EXAM: CT ABDOMEN AND PELVIS WITHOUT CONTRAST TECHNIQUE: Multidetector CT imaging of the abdomen and pelvis was performed following the standard protocol without IV contrast. COMPARISON:  KUB 1429 hours today.  Lumbar radiographs 01/03/2016. FINDINGS: Lower chest: Negative lung bases. No pericardial or pleural effusion. Hepatobiliary: Surgically absent gallbladder. Negative noncontrast liver.  Pancreas: Negative. Spleen: Negative. Adrenals/Urinary Tract: Normal adrenal glands. Right renal hilar vascular calcifications. No right nephrolithiasis. Negative noncontrast right kidney and right ureter. Moderate left hydronephrosis. Mild left nephromegaly. Mild left perinephric and proximal periureteral stranding. There is an obstructing round mildly lobulated 4 x 6 mm calculus located in the left ureter about 5 centimeters distal to the left ureteropelvic junction. This is at the L4-L5 level and may correspond to the calcifications seen by plain radiograph today. Distal to this stone the left ureter is decompressed and normal to the bladder. Unremarkable urinary bladder. Left renal  hilum vascular calcification, but no other collecting system stone identified. Stomach/Bowel: Decompressed distal large bowel. Redundant sigmoid. Minimal if any diverticula in the left or rectosigmoid colon. Decompressed descending colon. Mild to moderate retained stool in the more proximal colon. Mild transverse redundancy. Negative retrocecal appendix. Negative terminal ileum. Small 12 millimeter benign lipoma within the cecum may be associated with the IC valve (series 2, image 56). No dilated small bowel.  Decompressed stomach and duodenum. No abdominal free air or free fluid. Vascular/Lymphatic: Severe Aortoiliac calcified atherosclerosis. There is a femoral-femoral bypass graft in the ventral lower abdominal wall. Vascular stent within the right common and external iliac arteries. Vascular patency is not evaluated in the absence of IV contrast. Reproductive: Negative. Other: No pelvic free fluid. Musculoskeletal: Advanced lower lumbar facet degeneration. Mild levoconvex lumbar scoliosis. No acute osseous abnormality identified. IMPRESSION: 1. Acute obstructive uropathy on the left due to a 4 x 6 mm ureteral calculus located about 5 cm distal to the UPJ at the L4-L5 level. 2. No other nephrolithiasis or urologic calculus identified. 3. Advanced Aortoiliac calcified atherosclerosis. Right iliac artery stents and femoral-femoral bypass. Aortic Atherosclerosis (ICD10-I70.0). 4. Benign 12 mm lipoma suspected within the cecum. Electronically Signed   By: Genevie Ann M.D.   On: 03/11/2018 17:31   Dg Abd 1 View  Result Date: 03/11/2018 CLINICAL DATA:  Left flank pain with hematuria EXAM: ABDOMEN - 1 VIEW COMPARISON:  Lumbar radiographs 01/03/2016 FINDINGS: Surgical clips in the right upper quadrant. 4 mm calcification projecting over upper pole right kidney. Possible faint calcifications projecting over lower pole of right kidney. Possible 4 mm calcification overlying the upper pole of left kidney. Possible faint  calcification to the left of L5. Vascular stent in the right iliac region. Phleboliths in the pelvis. Nonobstructed gas pattern IMPRESSION: 1. Nonobstructed gas pattern 2. Possible faint 3 mm calcification in the left paraspinal region to the left of L5; CT KUB suggested to evaluate for possible ureteral stone given history 3. There appear to be stones projecting over the bilateral kidneys Electronically Signed   By: Donavan Foil M.D.   On: 03/11/2018 14:43     Assessment and Plan :  1. Acute left-sided low back pain without sciatica UA and urine micro consistent with hematuria, KUB shows ureteral stone. Likely that back pain is coming from kidney stone as it is not reproducible on exam.  However, patient is also very tender in left lower quadrant.  WBC elevated.  Cannot rule out infectious process such as diverticulitis at this time. Will send for stat CT of the abdomen and pelvis for further evaluation.  Will contact patient with results and discuss further treatment plan. - POCT urinalysis dipstick - DG Abd 1 View; Future - POCT Microscopic Urinalysis ([facility]) - Comprehensive metabolic panel; Future  2. Left lower quadrant pain - POCT CBC - Comprehensive metabolic panel; Future - CT Abdomen Pelvis Wo Contrast; Future -  CMP14+EGFR  3. Obstructive uropathy 4. Hematuria, unspecified type - DG Abd 1 View; Future - Urinalysis, microscopic only - Urine Microscopic 5. HTN (hypertension), benign Uncontrolled.  Likely due to pain.  Patient encouraged to follow-up in 1 week for reevaluation of HTN.   Update: Pt contacted with CT results showing obstructive uropathy and moderate hydronephrosis. Instructed pt to go immediately to ED for further evaluation and treatment as she will need to be evaluated by urology due to stone being obstructed and causing hydronephrosis. Pt agrees to do so.   Tenna Delaine PA-C  [facility] 03/13/2018 1:37 PM

## 2018-03-11 NOTE — Patient Instructions (Addendum)
I will contact you with your CT results and discuss further treatment plan. For CT, pease go to:     Please go to [facility] immediately after your visit for your CT exam today.   Andrea [name] is located at: [address], [place], [place] [address]   In terms of elevated blood pressure, I would like you to start taking bp medication. You can start with half of amlodipine and increase up to a full tablet.  check your blood pressure at least a couple times over the next couple of days outside of the office and document these values. It is best if you check the blood pressure at different times in the day. Your goal is <140/90. Return in one week for reevaluation.  If you start to have chest pain, blurred vision, shortness of breath, severe headache, lower leg swelling, or nausea/vomiting please seek care immediately here or at the ED.    IF you received an x-ray today, you will receive an invoice from [facility]. Please contact [facility] at [phone] with questions or concerns regarding your invoice.   IF you received labwork today, you will receive an invoice from [facility]. Please contact [facility] at [phone] with questions or concerns regarding your invoice.   Our billing staff will not be able to assist you with questions regarding bills from these companies.  You will be contacted with the lab results as soon as they are available. The fastest way to get your results is to activate your My Chart account. Instructions are located on the last page of this paperwork. If you have not heard from [place] regarding the results in 2 weeks, please contact this office.

## 2018-03-11 NOTE — ED Provider Notes (Signed)
[facility] DEPT Provider Note   CSN: [id number] Arrival date & time: 03/11/18  1939     History   Chief Complaint Chief Complaint  Patient presents with  . Flank Pain  . Kidney Stone    HPI Andrea [name] is a 52 y.o. female.  HPI   52 year old female with history of diabetes, hypertension,  peripheral vascular disease, carotid artery occlusion requiring endarterectomies and currently on Plavix presenting complaining of flank pain.  Patient has had low back pain for the past week pain is primarily to her left side.  Pain is more noticeable for the past 2 days.  She described pain as sensation that wraps from her a dull achy lower abdomen to her left flank.  Worse with palpation but presence all the time.  Currently rates pain as 8 out of 10.  She also noticed darker urine for the past several days as well as pain tinged blood in the urine today that concerns her.  She denies having fever, chills, lightheadedness, dizziness, nausea, vomiting, diarrhea, dysuria.  She admits to drinking a lot  of tea and coffee.  She was seen at an urgent care for her symptoms earlier today.  She did had blood work obtained as well as a CT scan.  She was contacted later on the day stating that she has a 6 mm stones with associated swelling to her ureter and she needs to come to ER for further evaluation.  She denies any prior history of kidney stone.  At home she has been taking Aleve as needed for pain.  Denies alcohol abuse.  Patient did mention that she has been out on disability for many years, recently resumed work and initially thought her pain is related to increase ambulation and moving about.  Past Medical History:  Diagnosis Date  . Allergy   . Anemia   . Anxiety    on Alprazolam  . Carotid artery occlusion    a. s/p prior endarterectomies with known RICA occlusion - followed by [facility].  . Depression    on Effexor  . Diabetes mellitus without complication ([place])     2841  . Heart murmur    was seen by cardiologist in [facility] 2013  . Hypertension   . Peripheral vascular disease ([place])   . PVD (peripheral vascular disease) ([place])    a. s/p prior LE bypass grafting and revision (followed by [facility]).  Marland Kitchen TIA (transient ischemic attack)    a. possible mini stroke in 2015 - was told she did not have full stroke.    Patient Active Problem List   Diagnosis Date Noted  . Shingles 06/07/2016  . Bilateral carotid artery stenosis 10/18/2014  . TIA (transient ischemic attack) 08/29/2014  . HLD (hyperlipidemia) 08/29/2014  . DM (diabetes mellitus) with complications ([place]) 32/44/0102  . HTN (hypertension), benign 08/29/2014  . Stroke ([place]) 06/22/2014  . CVA (cerebral infarction) 06/22/2014  . PVD (peripheral vascular disease) ([place]) 04/12/2014  . Occlusion and stenosis of carotid artery without mention of cerebral infarction 04/12/2014    Past Surgical History:  Procedure Laterality Date  . 2 stents    . CAROTID ENDARTERECTOMY    . CESAREAN SECTION    . CHOLECYSTECTOMY    . FEMORAL BYPASS     x 2 on right side due to re-stenosis  . left/right carotid artery     2003 and 2009 for right, right side is 100% occluded per patient; left side 2009  .  TUBAL LIGATION      OB History   None      Home Medications    Prior to Admission medications   Medication Sig Start Date End Date Taking? Authorizing Provider  ALPRAZolam (XANAX) 0.25 MG tablet Take 1 tablet (0.25 mg total) by mouth as needed for anxiety. Patient not taking: Reported on 03/11/2018 02/25/17   Tenna Delaine D, PA-C  amLODipine (NORVASC) 10 MG tablet TAKE 1 TABLET BY MOUTH ONCE DAILY 11/09/17   Leonie Douglas, PA-C  aspirin 81 MG tablet Take 1 tablet (81 mg total) by mouth daily. 04/27/17   Dunn, Nedra Hai, PA-C  atorvastatin (LIPITOR) 80 MG tablet Take 1 tablet (80 mg total) by mouth daily. 04/27/17 07/26/17  Dunn, Nedra Hai, PA-C  cetirizine (ZYRTEC) 10 MG tablet Take 1 tablet (10 mg  total) by mouth daily. Patient not taking: Reported on 03/11/2018 07/21/17   Tenna Delaine D, PA-C  clopidogrel (PLAVIX) 75 MG tablet TAKE 1 TABLET BY MOUTH ONCE DAILY 11/09/17   Tenna Delaine D, PA-C  fluticasone [facility]) 50 MCG/ACT nasal spray Place 2 sprays into both nostrils daily. 07/21/17   Tenna Delaine D, PA-C  glipiZIDE (GLUCOTROL XL) 10 MG 24 hr tablet Take 1 tablet (10 mg total) by mouth daily with breakfast. Patient not taking: Reported on 03/11/2018 06/17/17   Tenna Delaine D, PA-C  Lancets MISC Test blood sugar once daily. 02/25/17   Tenna Delaine D, PA-C  lisinopril (PRINIVIL,ZESTRIL) 2.5 MG tablet Take 1 tablet (2.5 mg total) by mouth daily. Patient not taking: Reported on 03/11/2018 06/17/17   Tenna Delaine D, PA-C  metFORMIN (GLUCOPHAGE) 1000 MG tablet Take 1 tablet (1,000 mg total) by mouth 2 (two) times daily with a meal. 06/17/17   Tenna Delaine D, PA-C  venlafaxine XR (EFFEXOR-XR) 150 MG 24 hr capsule TAKE 1 CAPSULE BY MOUTH DAILY WITH BREAKFAST 03/11/17   Tereasa Coop, PA-C    Family History Family History  Problem Relation Age of Onset  . Diabetes Mother   . Heart disease Mother   . Hyperlipidemia Mother   . Hypertension Mother   . Heart disease Father        MI, unknown age  . Hyperlipidemia Father   . Hypertension Father   . Diabetes Maternal Grandmother   . Heart disease Maternal Grandmother   . Hyperlipidemia Maternal Grandmother   . Cancer Paternal Grandmother   . Cancer Sister   . Varicose Veins Sister   . Heart attack Brother        Age 78    Social History Social History   Tobacco Use  . Smoking status: Current Every Day Smoker    Packs/day: 0.50    Types: Cigarettes  . Smokeless tobacco: Never Used  . Tobacco comment: 1 pack will last 3 days.  Substance Use Topics  . Alcohol use: Yes    Alcohol/week: 0.0 oz    Comment: holidays/special occas 3-4 drinks  . Drug use: No     Allergies   Other   Review of  Systems Review of Systems  All other systems reviewed and are negative.    Physical Exam Updated Vital Signs BP (!) 174/77 (BP Location: Left Arm)   Pulse 100   Temp 97.9 F (36.6 C) (Oral)   Resp 18   SpO2 97%   Physical Exam  Constitutional: She appears well-developed and well-nourished. No distress.  HENT:  Head: Atraumatic.  Eyes: Conjunctivae are normal.  Neck: Neck supple.  Cardiovascular:  Normal rate and regular rhythm.  Pulmonary/Chest: Effort normal and breath sounds normal.  Abdominal: Soft.  Neurological: She is alert.  Skin: No rash noted.  Psychiatric: She has a normal mood and affect.  Nursing note and vitals reviewed.    ED Treatments / Results  Labs (all labs ordered are listed, but only abnormal results are displayed) Labs Reviewed  CBC WITH DIFFERENTIAL/PLATELET - Abnormal; Notable for the following components:      Result Value   WBC 12.2 (*)    Lymphs Abs 5.0 (*)    Monocytes Absolute 1.2 (*)    All other components within normal limits  BASIC METABOLIC PANEL - Abnormal; Notable for the following components:   Glucose, Bld 150 (*)    All other components within normal limits  URINALYSIS, ROUTINE W REFLEX MICROSCOPIC - Abnormal; Notable for the following components:   Color, Urine STRAW (*)    Specific Gravity, Urine 1.002 (*)    Hgb urine dipstick LARGE (*)    Bacteria, UA RARE (*)    All other components within normal limits    EKG  EKG Interpretation None       Radiology Ct Abdomen Pelvis Wo Contrast  Result Date: 03/11/2018 CLINICAL DATA:  52 year old female with left lower quadrant abdominal pain and gross hematuria. EXAM: CT ABDOMEN AND PELVIS WITHOUT CONTRAST TECHNIQUE: Multidetector CT imaging of the abdomen and pelvis was performed following the standard protocol without IV contrast. COMPARISON:  KUB 1429 hours today.  Lumbar radiographs 01/03/2016. FINDINGS: Lower chest: Negative lung bases. No pericardial or pleural effusion.  Hepatobiliary: Surgically absent gallbladder. Negative noncontrast liver. Pancreas: Negative. Spleen: Negative. Adrenals/Urinary Tract: Normal adrenal glands. Right renal hilar vascular calcifications. No right nephrolithiasis. Negative noncontrast right kidney and right ureter. Moderate left hydronephrosis. Mild left nephromegaly. Mild left perinephric and proximal periureteral stranding. There is an obstructing round mildly lobulated 4 x 6 mm calculus located in the left ureter about 5 centimeters distal to the left ureteropelvic junction. This is at the L4-L5 level and may correspond to the calcifications seen by plain radiograph today. Distal to this stone the left ureter is decompressed and normal to the bladder. Unremarkable urinary bladder. Left renal hilum vascular calcification, but no other collecting system stone identified. Stomach/Bowel: Decompressed distal large bowel. Redundant sigmoid. Minimal if any diverticula in the left or rectosigmoid colon. Decompressed descending colon. Mild to moderate retained stool in the more proximal colon. Mild transverse redundancy. Negative retrocecal appendix. Negative terminal ileum. Small 12 millimeter benign lipoma within the cecum may be associated with the IC valve (series 2, image 56). No dilated small bowel.  Decompressed stomach and duodenum. No abdominal free air or free fluid. Vascular/Lymphatic: Severe Aortoiliac calcified atherosclerosis. There is a femoral-femoral bypass graft in the ventral lower abdominal wall. Vascular stent within the right common and external iliac arteries. Vascular patency is not evaluated in the absence of IV contrast. Reproductive: Negative. Other: No pelvic free fluid. Musculoskeletal: Advanced lower lumbar facet degeneration. Mild levoconvex lumbar scoliosis. No acute osseous abnormality identified. IMPRESSION: 1. Acute obstructive uropathy on the left due to a 4 x 6 mm ureteral calculus located about 5 cm distal to the UPJ at  the L4-L5 level. 2. No other nephrolithiasis or urologic calculus identified. 3. Advanced Aortoiliac calcified atherosclerosis. Right iliac artery stents and femoral-femoral bypass. Aortic Atherosclerosis (ICD10-I70.0). 4. Benign 12 mm lipoma suspected within the cecum. Electronically Signed   By: Genevie Ann M.D.   On: 03/11/2018 17:31   Dg  Abd 1 View  Result Date: 03/11/2018 CLINICAL DATA:  Left flank pain with hematuria EXAM: ABDOMEN - 1 VIEW COMPARISON:  Lumbar radiographs 01/03/2016 FINDINGS: Surgical clips in the right upper quadrant. 4 mm calcification projecting over upper pole right kidney. Possible faint calcifications projecting over lower pole of right kidney. Possible 4 mm calcification overlying the upper pole of left kidney. Possible faint calcification to the left of L5. Vascular stent in the right iliac region. Phleboliths in the pelvis. Nonobstructed gas pattern IMPRESSION: 1. Nonobstructed gas pattern 2. Possible faint 3 mm calcification in the left paraspinal region to the left of L5; CT KUB suggested to evaluate for possible ureteral stone given history 3. There appear to be stones projecting over the bilateral kidneys Electronically Signed   By: Donavan Foil M.D.   On: 03/11/2018 14:43    Procedures Procedures (including critical care time)  Medications Ordered in ED Medications  oxyCODONE-acetaminophen (PERCOCET/ROXICET) 5-325 MG per tablet 1 tablet (1 tablet Oral Given 03/11/18 2328)     Initial Impression / Assessment and Plan / ED Course  I have reviewed the triage vital signs and the nursing notes.  Pertinent labs & imaging results that were available during my care of the patient were reviewed by me and considered in my medical decision making (see chart for details).     BP 131/63 (BP Location: Left Arm)   Pulse 72   Temp 98.5 F (36.9 C) (Oral)   Resp 18   SpO2 98%    Final Clinical Impressions(s) / ED Diagnoses   Final diagnoses:  Kidney stone on left side     ED Discharge Orders        Ordered    oxyCODONE-acetaminophen (PERCOCET/ROXICET) 5-325 MG tablet  Every 6 hours PRN     03/11/18 2355    ondansetron (ZOFRAN) 4 MG tablet  Every 8 hours PRN     03/11/18 2355    tamsulosin (FLOMAX) 0.4 MG CAPS capsule  Daily     03/11/18 2355     9:52 PM Patient recently diagnosed with having an obstructive kidney stone involving the left UPJ.  6 mm stone on CT scan.  She was sent here for further evaluation of her condition.  She is well-appearing but does have left CVA tenderness and left abdominal tenderness.  Will check basic labs, check urine, and provide pain medication.  11:59 PM Labs are reassuring.  Mildly elevated leukocytes at 12.3.  Normal renal function.  UA without evidence of UTI.  Pt received pain medication and felt better, no vomiting. Pt stable to discharge home with pain medication, antinausea medication and flomax.  In order to decrease risk of narcotic abuse. Pt's record were checked using the [place] Controlled Substance database.     Domenic Moras, PA-C 03/12/18 Dyann Kief    Dorie Rank, MD 03/14/18 [phone]

## 2018-03-11 NOTE — Discharge Instructions (Addendum)
You have a 74mm kidney stone on the left side.  Take pain medication, antinausea medication and flomax as prescribed.  Use urine strainer to capture the stone and bring it to the urologist for further evaluation.  Return if you have any concerns.  Pain medication can cause drowsiness, therefore do not operate heavy machinery or drive when you are taking opiate pain medication.

## 2018-03-11 NOTE — ED Triage Notes (Signed)
Pt reports that she has been advised to come to the ER for further evaluation, states that she presented to her PCP earlier at [facility] with c/o flank pain, was sent for CT and was called with results advising her that she has a "18mm kidney stone and needs to be seen soon than later."

## 2018-03-11 NOTE — ED Notes (Signed)
Pt is complaining saying its taking too long and she is going to leave AMA

## 2018-03-12 LAB — CMP14+EGFR
ALBUMIN: 4.6 g/dL (ref 3.5–5.5)
ALT: 8 IU/L (ref 0–32)
AST: 14 IU/L (ref 0–40)
Albumin/Globulin Ratio: 1.6 (ref 1.2–2.2)
Alkaline Phosphatase: 104 IU/L (ref 39–117)
BUN / CREAT RATIO: 14 (ref 9–23)
BUN: 9 mg/dL (ref 6–24)
Bilirubin Total: 0.3 mg/dL (ref 0.0–1.2)
CALCIUM: 10.5 mg/dL — AB (ref 8.7–10.2)
CO2: 26 mmol/L (ref 20–29)
CREATININE: 0.65 mg/dL (ref 0.57–1.00)
Chloride: 102 mmol/L (ref 96–106)
GFR calc Af Amer: 116 mL/min/{1.73_m2} (ref >59)
GFR, EST NON AFRICAN AMERICAN: 101 mL/min/{1.73_m2} (ref >59)
GLOBULIN, TOTAL: 2.8 g/dL (ref 1.5–4.5)
Glucose: 86 mg/dL (ref 65–99)
Potassium: 4.6 mmol/L (ref 3.5–5.2)
SODIUM: 142 mmol/L (ref 134–144)
Total Protein: 7.4 g/dL (ref 6.0–8.5)

## 2018-03-12 LAB — URINALYSIS, MICROSCOPIC ONLY: CASTS: NONE SEEN /LPF

## 2018-03-13 ENCOUNTER — Encounter: Payer: Self-pay | Admitting: Physician Assistant

## 2018-03-14 ENCOUNTER — Encounter: Payer: Self-pay | Admitting: Physician Assistant

## 2018-03-15 ENCOUNTER — Telehealth: Payer: Self-pay | Admitting: Physician Assistant

## 2018-03-15 NOTE — Telephone Encounter (Signed)
Contacted patient.  She is still in same amount of pain.  She is taking pain meds and Flomax as prescribed.  She denies vomiting, fever, chills, urinary frequency, dysuria, and urgency.  She did not contact [facility] this morning.  I therefore contacted the urologist on call at [facility].  Dr. Louis Meckel contacted me back.  Discussed patient's case with Dr. Louis Meckel.  He recommended continuing symptomatic treatment and having patient contact [facility] first thing in the morning.  I contacted patient again and informed her of this.  She notes she will call them first thing in the morning.  Given her strict ED precautions, if she develops any persistent nausea, new vomiting or fever, inability to tolerate food, worsening pain, or other concerning symptoms, seek care immediately at the ED tonight.  Patient understands and agrees to treatment plan.

## 2018-03-16 ENCOUNTER — Encounter: Payer: Self-pay | Admitting: Physician Assistant

## 2018-03-17 ENCOUNTER — Telehealth: Payer: Self-pay | Admitting: Physician Assistant

## 2018-03-17 NOTE — Telephone Encounter (Signed)
Can you please call Pam at [facility] and let her know that I do not manage pt's plavix and aspirin.She is followed by vascular surgery for this. See sees Vinnie Level Nickel L, NP. They are the ones that will need to decide about holding plavix and aspirin. Their contact info is below: Thanks!    [facility] [address] [place], [place] [address] [phone]

## 2018-03-17 NOTE — Telephone Encounter (Signed)
Phone call to [facility], office closed.  Clinic staff, please call urology tomorrow at [place] when office opens.

## 2018-03-17 NOTE — Telephone Encounter (Signed)
Copied from [place] [phone]. Topic: Inquiry >> Mar 17, 2018  8:15 AM Conception Chancy, NT wrote: Andrea [name] is calling from from [facility] with Dr. Louis Meckel office and states she is needing surgery clearance to hold Plavix and Aspirin 5 days prior. Please contact Ledon Snare [phone] ext [phone]

## 2018-03-17 NOTE — Telephone Encounter (Signed)
Please advise 

## 2018-03-18 ENCOUNTER — Encounter: Payer: Self-pay | Admitting: *Deleted

## 2018-03-18 NOTE — Telephone Encounter (Signed)
LMOVM for [facility] @ [facility] [phone] Advised Brittany's msg - Call Vinnie Level Nickel NP @ [facility]  [phone]

## 2018-03-20 ENCOUNTER — Encounter: Payer: Self-pay | Admitting: Physician Assistant

## 2018-03-22 ENCOUNTER — Other Ambulatory Visit: Payer: Self-pay | Admitting: Urology

## 2018-03-23 ENCOUNTER — Other Ambulatory Visit: Payer: Self-pay | Admitting: Physician Assistant

## 2018-03-23 NOTE — Progress Notes (Signed)
Work note written

## 2018-03-25 ENCOUNTER — Encounter (HOSPITAL_COMMUNITY): Payer: Self-pay | Admitting: General Practice

## 2018-03-29 ENCOUNTER — Ambulatory Visit (HOSPITAL_COMMUNITY)
Admission: RE | Admit: 2018-03-29 | Discharge: 2018-03-29 | Disposition: A | Discharge status: Home / Self Care | Payer: Medicare Other | Source: Ambulatory Visit | Attending: Urology | Admitting: Urology

## 2018-03-29 ENCOUNTER — Ambulatory Visit (HOSPITAL_COMMUNITY): Payer: Medicare Other

## 2018-03-29 ENCOUNTER — Encounter (HOSPITAL_COMMUNITY)
Admission: RE | Disposition: A | Discharge status: Home / Self Care | Payer: Self-pay | Source: Ambulatory Visit | Attending: Urology

## 2018-03-29 ENCOUNTER — Encounter (HOSPITAL_COMMUNITY): Payer: Self-pay | Admitting: General Practice

## 2018-03-29 DIAGNOSIS — Z85828 Personal history of other malignant neoplasm of skin: Secondary | ICD-10-CM | POA: Diagnosis not present

## 2018-03-29 DIAGNOSIS — M160 Bilateral primary osteoarthritis of hip: Secondary | ICD-10-CM | POA: Insufficient documentation

## 2018-03-29 DIAGNOSIS — Z8673 Personal history of transient ischemic attack (TIA), and cerebral infarction without residual deficits: Secondary | ICD-10-CM | POA: Insufficient documentation

## 2018-03-29 DIAGNOSIS — R011 Cardiac murmur, unspecified: Secondary | ICD-10-CM | POA: Insufficient documentation

## 2018-03-29 DIAGNOSIS — E1151 Type 2 diabetes mellitus with diabetic peripheral angiopathy without gangrene: Secondary | ICD-10-CM | POA: Insufficient documentation

## 2018-03-29 DIAGNOSIS — Z7902 Long term (current) use of antithrombotics/antiplatelets: Secondary | ICD-10-CM | POA: Insufficient documentation

## 2018-03-29 DIAGNOSIS — N201 Calculus of ureter: Secondary | ICD-10-CM | POA: Diagnosis present

## 2018-03-29 DIAGNOSIS — F17210 Nicotine dependence, cigarettes, uncomplicated: Secondary | ICD-10-CM | POA: Diagnosis not present

## 2018-03-29 DIAGNOSIS — Z7982 Long term (current) use of aspirin: Secondary | ICD-10-CM | POA: Insufficient documentation

## 2018-03-29 DIAGNOSIS — Z87442 Personal history of urinary calculi: Secondary | ICD-10-CM | POA: Insufficient documentation

## 2018-03-29 DIAGNOSIS — Z91041 Radiographic dye allergy status: Secondary | ICD-10-CM | POA: Insufficient documentation

## 2018-03-29 DIAGNOSIS — I499 Cardiac arrhythmia, unspecified: Secondary | ICD-10-CM | POA: Insufficient documentation

## 2018-03-29 DIAGNOSIS — I10 Essential (primary) hypertension: Secondary | ICD-10-CM | POA: Diagnosis not present

## 2018-03-29 DIAGNOSIS — Z8249 Family history of ischemic heart disease and other diseases of the circulatory system: Secondary | ICD-10-CM | POA: Diagnosis not present

## 2018-03-29 DIAGNOSIS — F419 Anxiety disorder, unspecified: Secondary | ICD-10-CM | POA: Diagnosis not present

## 2018-03-29 DIAGNOSIS — F329 Major depressive disorder, single episode, unspecified: Secondary | ICD-10-CM | POA: Insufficient documentation

## 2018-03-29 HISTORY — DX: Unspecified osteoarthritis, unspecified site: M19.90

## 2018-03-29 HISTORY — DX: Malignant (primary) neoplasm, unspecified: C80.1

## 2018-03-29 HISTORY — DX: Personal history of urinary calculi: Z87.442

## 2018-03-29 HISTORY — PX: EXTRACORPOREAL SHOCK WAVE LITHOTRIPSY: SHX1557

## 2018-03-29 LAB — GLUCOSE, CAPILLARY: GLUCOSE-CAPILLARY: 136 mg/dL — AB (ref 65–99)

## 2018-03-29 SURGERY — LITHOTRIPSY, ESWL
Anesthesia: LOCAL | Laterality: Left

## 2018-03-29 MED ORDER — DIAZEPAM 5 MG PO TABS
10.0000 mg | ORAL_TABLET | ORAL | Status: AC
  Administered 2018-03-29: 10 mg via ORAL
  Filled 2018-03-29: qty 2

## 2018-03-29 MED ORDER — SODIUM CHLORIDE 0.9 % IV SOLN
INTRAVENOUS | Status: DC
  Administered 2018-03-29: 07:00:00 via INTRAVENOUS

## 2018-03-29 MED ORDER — KETOROLAC TROMETHAMINE 30 MG/ML IJ SOLN: 30.0000 mg | Freq: Once | INTRAMUSCULAR | Status: DC

## 2018-03-29 MED ORDER — DIPHENHYDRAMINE HCL 25 MG PO CAPS
25.0000 mg | ORAL_CAPSULE | ORAL | Status: AC
  Administered 2018-03-29: 25 mg via ORAL
  Filled 2018-03-29: qty 1

## 2018-03-29 MED ORDER — CIPROFLOXACIN HCL 500 MG PO TABS
500.0000 mg | ORAL_TABLET | ORAL | Status: AC
  Administered 2018-03-29: 500 mg via ORAL
  Filled 2018-03-29: qty 1

## 2018-03-29 SURGICAL SUPPLY — 2 items
COVER SURGICAL LIGHT HANDLE (MISCELLANEOUS) ×2 IMPLANT
TOWEL OR 17X26 10 PK STRL BLUE (TOWEL DISPOSABLE) ×2 IMPLANT

## 2018-03-29 NOTE — Discharge Instructions (Signed)
See [facility] discharge instructions in chart.  

## 2018-03-29 NOTE — Interval H&P Note (Signed)
History and Physical Interval Note:  03/29/2018 9:19 AM  Andrea [name]  has presented today for surgery, with the diagnosis of left ureteral stone  The various methods of treatment have been discussed with the patient and family. After consideration of risks, benefits and other options for treatment, the patient has consented to  Procedure(s): LEFT EXTRACORPOREAL SHOCK WAVE LITHOTRIPSY (ESWL) (Left) as a surgical intervention .  The patient's history has been reviewed, patient examined, no change in status, stable for surgery.  I have reviewed the patient's chart and labs.  Questions were answered to the patient's satisfaction.     Lillette Boxer Aarib Pulido

## 2018-03-29 NOTE — H&P (Signed)
H&P  Chief Complaint: Left kidney stone History of Present Illness: Andrea [name] is a 52 y.o. year old female presents for ESL for mgmt of a left proximal ureteral stone diagnosed 03/11/2018. She was seen in our office 3/26 by Dr Louis Meckel with KUB revealing non-progression of the stone.   Past Medical History:  Diagnosis Date  . Allergy   . Anemia   . Anxiety    on Alprazolam  . Arthritis    both hips  . Cancer ([place])    skin cancer  . Carotid artery occlusion    a. s/p prior endarterectomies with known RICA occlusion - followed by [facility].  . Depression    on Effexor  . Diabetes mellitus without complication ([place])    5732  . Heart murmur    was seen by cardiologist in [facility] 2013  . History of kidney stones   . Hypertension   . Peripheral vascular disease ([place])   . PVD (peripheral vascular disease) ([place])    a. s/p prior LE bypass grafting and revision (followed by [facility]).  Marland Kitchen TIA (transient ischemic attack)    a. possible mini stroke in 2015 - was told she did not have full stroke.    Past Surgical History:  Procedure Laterality Date  . 2 stents    . CAROTID ENDARTERECTOMY    . CESAREAN SECTION    . CHOLECYSTECTOMY    . FEMORAL BYPASS     x 2 on right side due to re-stenosis  . left/right carotid artery     2003 and 2009 for right, right side is 100% occluded per patient; left side 2009  . TUBAL LIGATION      Home Medications:  No medications prior to admission.    Allergies:  Allergies  Allergen Reactions  . Other Hives    Contrast dye     Family History  Problem Relation Age of Onset  . Diabetes Mother   . Heart disease Mother   . Hyperlipidemia Mother   . Hypertension Mother   . Heart disease Father        MI, unknown age  . Hyperlipidemia Father   . Hypertension Father   . Diabetes Maternal Grandmother   . Heart disease Maternal Grandmother   . Hyperlipidemia Maternal Grandmother   . Cancer Paternal Grandmother   . Cancer Sister   .  Varicose Veins Sister   . Heart attack Brother        Age 60    Social History:  reports that she has been smoking cigarettes.  She has been smoking about 0.50 packs per day. She has never used smokeless tobacco. She reports that she drinks alcohol. She reports that she does not use drugs.  ROS: A complete review of systems was performed.  All systems are negative except for pertinent findings as noted.  Patient reports frequent urination, get up at night to urinate, and have to strain to urinate. Patient denies hard to postpone urination, burning /pain with urination, leakage of urine, stream starts and stops, trouble starting your stream, and being pregnant. Gastrointestinal (Upper): Patient reports nausea. Patient denies vomiting and indigestion/ heartburn. Gastrointestinal (Lower): Patient denies diarrhea and constipation. Constitutional: Patient reports night sweats, weight loss, and fatigue. Patient denies fever. Skin: Patient denies skin rash/ lesion and itching. Eyes: Patient denies blurred vision and double vision. Ears/ Nose/ Throat: Patient reports sinus problems. Patient denies sore throat. Hematologic/Lymphatic: Patient reports easy bruising. Patient denies swollen glands. Cardiovascular: Patient denies leg  swelling and chest pains. Respiratory: Patient denies cough and shortness of breath. Endocrine: Patient denies excessive thirst. Musculoskeletal: Patient reports back pain and joint pain. Neurological: Patient reports headaches and dizziness. Psychologic: Patient reports depression and anxiety.    Physical Exam:  Vital signs in last 24 hours:   General:  Alert and oriented, No acute distress HEENT: Normocephalic, atraumatic Neck: No JVD or lymphadenopathy Cardiovascular: Regular rate and rhythm Lungs: Clear bilaterally Abdomen: Soft, nontender, nondistended, no abdominal masses Back: No CVA tenderness Extremities: No edema Neurologic: Grossly intact  Laboratory Data:  No  results found for this or any previous visit (from the past 24 hour(s)). No results found for this or any previous visit (from the past 240 hour(s)). Creatinine: No results for input(s): CREATININE in the last 168 hours.  Radiologic Imaging: No results found.  Impression/Assessment:  Left mid ureteral stone, 4x 6 mm  Plan:  Left ESL  Lillette Boxer Briston Lax 03/29/2018, 6:15 AM  Lillette Boxer. Claretta Kendra MD

## 2018-03-29 NOTE — Op Note (Signed)
See [facility] OP note scanned into chart. 

## 2018-03-30 ENCOUNTER — Encounter (HOSPITAL_COMMUNITY): Payer: Self-pay | Admitting: Urology

## 2018-04-24 ENCOUNTER — Other Ambulatory Visit: Payer: Self-pay | Admitting: Physician Assistant

## 2018-04-24 DIAGNOSIS — E119 Type 2 diabetes mellitus without complications: Secondary | ICD-10-CM

## 2018-05-17 ENCOUNTER — Encounter: Payer: Self-pay | Admitting: Family Medicine

## 2018-05-25 ENCOUNTER — Encounter: Payer: Self-pay | Admitting: Physician Assistant

## 2018-05-28 ENCOUNTER — Other Ambulatory Visit: Payer: Self-pay | Admitting: Cardiovascular Disease

## 2018-05-28 MED ORDER — ATORVASTATIN CALCIUM 80 MG PO TABS: 80.0000 mg | ORAL_TABLET | Freq: Every day | ORAL | 0 refills | Status: DC

## 2018-06-07 ENCOUNTER — Other Ambulatory Visit: Payer: Self-pay | Admitting: *Deleted

## 2018-06-07 ENCOUNTER — Encounter: Payer: Self-pay | Admitting: Physician Assistant

## 2018-06-07 ENCOUNTER — Ambulatory Visit: Payer: Medicare Other | Admitting: Physician Assistant

## 2018-06-07 DIAGNOSIS — E119 Type 2 diabetes mellitus without complications: Secondary | ICD-10-CM

## 2018-06-07 MED ORDER — METFORMIN HCL 1000 MG PO TABS: 1000.0000 mg | ORAL_TABLET | Freq: Two times a day (BID) | ORAL | 0 refills | Status: DC

## 2018-06-09 ENCOUNTER — Ambulatory Visit: Payer: Medicare Other | Admitting: Physician Assistant

## 2018-06-09 ENCOUNTER — Encounter: Payer: Self-pay | Admitting: Physician Assistant

## 2018-06-09 ENCOUNTER — Other Ambulatory Visit: Payer: Self-pay

## 2018-06-09 VITALS — BP 154/77 | HR 86 | Temp 98.8°F | Resp 20 | Ht 64.17 in | Wt 128.8 lb

## 2018-06-09 DIAGNOSIS — I6523 Occlusion and stenosis of bilateral carotid arteries: Secondary | ICD-10-CM | POA: Diagnosis not present

## 2018-06-09 DIAGNOSIS — I10 Essential (primary) hypertension: Secondary | ICD-10-CM

## 2018-06-09 DIAGNOSIS — F32A Depression, unspecified: Secondary | ICD-10-CM

## 2018-06-09 DIAGNOSIS — F419 Anxiety disorder, unspecified: Secondary | ICD-10-CM | POA: Diagnosis not present

## 2018-06-09 DIAGNOSIS — J069 Acute upper respiratory infection, unspecified: Secondary | ICD-10-CM

## 2018-06-09 DIAGNOSIS — T161XXA Foreign body in right ear, initial encounter: Secondary | ICD-10-CM | POA: Diagnosis not present

## 2018-06-09 DIAGNOSIS — J302 Other seasonal allergic rhinitis: Secondary | ICD-10-CM

## 2018-06-09 DIAGNOSIS — F329 Major depressive disorder, single episode, unspecified: Secondary | ICD-10-CM | POA: Diagnosis not present

## 2018-06-09 DIAGNOSIS — E785 Hyperlipidemia, unspecified: Secondary | ICD-10-CM | POA: Diagnosis not present

## 2018-06-09 DIAGNOSIS — E119 Type 2 diabetes mellitus without complications: Secondary | ICD-10-CM

## 2018-06-09 LAB — POCT URINALYSIS DIP (MANUAL ENTRY)
BILIRUBIN UA: NEGATIVE mg/dL
Bilirubin, UA: NEGATIVE
Glucose, UA: NEGATIVE mg/dL
Leukocytes, UA: NEGATIVE
Nitrite, UA: NEGATIVE
PH UA: 5.5 (ref 5.0–8.0)
PROTEIN UA: NEGATIVE mg/dL
RBC UA: NEGATIVE
SPEC GRAV UA: 1.025 (ref 1.010–1.025)
UROBILINOGEN UA: 0.2 U/dL

## 2018-06-09 LAB — POCT GLYCOSYLATED HEMOGLOBIN (HGB A1C): HEMOGLOBIN A1C: 7.4 % — AB (ref 4.0–5.6)

## 2018-06-09 MED ORDER — METFORMIN HCL 1000 MG PO TABS: 1000.0000 mg | ORAL_TABLET | Freq: Two times a day (BID) | ORAL | 1 refills | Status: DC

## 2018-06-09 MED ORDER — AMLODIPINE BESYLATE 10 MG PO TABS: 10.0000 mg | ORAL_TABLET | Freq: Every day | ORAL | 1 refills | Status: DC

## 2018-06-09 MED ORDER — CETIRIZINE HCL 10 MG PO TABS: 10.0000 mg | ORAL_TABLET | Freq: Every day | ORAL | 3 refills | Status: DC

## 2018-06-09 MED ORDER — FLUTICASONE PROPIONATE 50 MCG/ACT NA SUSP: 2.0000 | Freq: Every day | NASAL | 3 refills | Status: DC

## 2018-06-09 MED ORDER — CLOPIDOGREL BISULFATE 75 MG PO TABS: 75.0000 mg | ORAL_TABLET | Freq: Every day | ORAL | 1 refills | Status: DC

## 2018-06-09 MED ORDER — ROSUVASTATIN CALCIUM 10 MG PO TABS: 10.0000 mg | ORAL_TABLET | Freq: Every day | ORAL | 3 refills | Status: DC

## 2018-06-09 MED ORDER — LANCETS MISC: 2 refills | Status: AC

## 2018-06-09 MED ORDER — VENLAFAXINE HCL ER 75 MG PO CP24: 75.0000 mg | ORAL_CAPSULE | Freq: Every day | ORAL | 0 refills | Status: DC

## 2018-06-09 MED ORDER — VENLAFAXINE HCL ER 150 MG PO CP24: ORAL_CAPSULE | ORAL | 1 refills | Status: DC

## 2018-06-09 NOTE — Progress Notes (Signed)
MRN: [id number] DOB: 15-Sep-1966  Subjective:   Andrea [name] is a 52 y.o. female with PMH HTN, HLD, DM2, bilateral carotid disease s/p prior endarterectomies with known RICA occlusion, and PVD s/p fem-fem BPG (followed by [facility]), anxiety, depression, possible prior TIA 2015, longstanding tobacco since age 58  presenting for medication refills for chronic medical conditions:  -[id number] managed with norvasc 30m daily, has been out for 3-4 days. Patient is not checking blood pressure at home. Reports no symptoms. Denies lightheadedness, dizziness, chronic headache, double vision, chest pain, shortness of breath, heart racing, palpitations, nausea, vomiting, abdominal pain, hematuria, lower leg swelling. Lifestyle: Walking every day at work. Eating less. Has lost 6 lbs in the past 2 months.  Smokes 1 ppd. Not ready to quit.   -T2DM: Diagnosis was made 2008. Patient is currently managed with metformin 10026mBID. Last A1C was 06/17/17 was 6.0. Has been out of this for 2.5 months. Patient is not checking home blood sugars regularly.  Current symptoms include none. Patient denies foot ulcerations, nausea, paresthesia of the feet, polydipsia, polyuria, visual disturbances, vomiting and weight loss. Patient is checking their feet daily. No foot concerns. Last diabetic eye exam eye exam was 07/2017, it was normal.   -Bilateral carotid artery stenosis: Takes aspirin 8170mnd plavix 40m57mily. Has been on this regimen since 2018. Has not followed up with cardiology and vascular surgery for at least 1 year.   -For anxiety and depression: Takes effexor-xr 150mg17mly, has been out for 3-4 days.She has been on this since 2003.  She is very irritable and crying a lot since being out. Denies insomnia, confusion, dry mouth, diaphoresis, headache, nausea, vomiting, and abdominal pain. Denies SI and HI.   -For HLD: Was placed on atrovastatin 80mg 72my about a year ago. Was taking simvastatin prior to  this. Stopped taking atorvastatin at least 3 months ago due to extreme joint aches. Had this with lipitor in the past but did not have any pain with simvastatin.   ROS per HPI  TeresaEllenie current medication list which includes the following prescription(s): amlodipine, aspirin, clopidogrel, fluticasone, lancets, metformin, venlafaxine xr, cetirizine, ondansetron, oxycodone-acetaminophen, rosuvastatin, tamsulosin, and venlafaxine xr. Also is allergic to other.  TeresaAminaa past medical history of Allergy, Anemia, Anxiety, Arthritis, Cancer (HCC), [place], Depression, Diabetes mellitus without complication (HCC), Neihartrt murmur, History of kidney stones, Hypertension, Peripheral vascular disease (HCC), [place] (peripheral vascular disease) (HCC), [place] TIA (transient ischemic attack). Also  has a past surgical history that includes Cholecystectomy; Cesarean section; Tubal ligation; Femoral bypass; left/right carotid artery; 2 stents; Carotid endarterectomy; and Extracorporeal shock wave lithotripsy (Left, 03/29/2018).   Objective:   Vitals: BP (!) 154/77   Pulse 86   Temp 98.8 F (37.1 C) (Oral)   Resp 20   Ht 5' 4.17" (1.63 m)   Wt 128 lb 12.8 oz (58.4 kg)   SpO2 97%   BMI 21.99 kg/m   Physical Exam  Constitutional: She is oriented to person, place, and time. She appears well-developed and well-nourished. No distress.  HENT:  Head: Normocephalic and atraumatic.  Right Ear: Tympanic membrane and external ear normal. A foreign body (small black plastic like foreign body in ear canal) is present.  Left Ear: Tympanic membrane, external ear and ear canal normal.  Mouth/Throat: Uvula is midline, oropharynx is clear and moist and mucous membranes are normal.  Eyes: Pupils are equal, round, and reactive to light. Conjunctivae and EOM are  normal.  Fundoscopic exam:      The right eye shows no AV nicking, no hemorrhage and no papilledema. The right eye shows red reflex.       The left  eye shows no AV nicking, no hemorrhage and no papilledema. The left eye shows red reflex.  Neck: Normal range of motion.  Cardiovascular: Normal rate, regular rhythm and intact distal pulses.  Murmur heard.  Systolic murmur is present. Bilateral carotid bruits noted. Bilateral CEA scars well healed    Pulmonary/Chest: Effort normal and breath sounds normal. She has no wheezes. She has no rhonchi. She has no rales.  Musculoskeletal:       Right lower leg: She exhibits no swelling.       Left lower leg: She exhibits no swelling.  Neurological: She is alert and oriented to person, place, and time.  Skin: Skin is warm and dry.  Psychiatric: She has a normal mood and affect.  Tearful during exam.   Vitals reviewed.   Results for orders placed or performed in visit on 06/09/18 (from the past 24 hour(s))  POCT glycosylated hemoglobin (Hb A1C)     Status: Abnormal   Collection Time: 06/09/18  3:05 PM  Result Value Ref Range   Hemoglobin A1C 7.4 (A) 4.0 - 5.6 %   HbA1c, POC (prediabetic range)  5.7 - 6.4 %   HbA1c, POC (controlled diabetic range)  0.0 - 7.0 %  POCT urinalysis dipstick     Status: None   Collection Time: 06/09/18  3:05 PM  Result Value Ref Range   Color, UA yellow yellow   Clarity, UA clear clear   Glucose, UA negative negative mg/dL   Bilirubin, UA negative negative   Ketones, POC UA negative negative mg/dL   Spec Grav, UA 1.025 1.010 - 1.025   Blood, UA negative negative   pH, UA 5.5 5.0 - 8.0   Protein Ur, POC negative negative mg/dL   Urobilinogen, UA 0.2 0.2 or 1.0 E.U./dL   Nitrite, UA Negative Negative   Leukocytes, UA Negative Negative     Wt Readings from Last 3 Encounters:  06/09/18 128 lb 12.8 oz (58.4 kg)  03/29/18 134 lb (60.8 kg)  03/11/18 134 lb 9.6 oz (61.1 kg)   Post ear lavage, right ear canal is clear, foreign body successfully removed. Ear canal is mildly irritated. No bleeding noted. TM is intact and normal in appearance.  Assessment and  Plan :  1. Type 2 diabetes mellitus without complication, without long-term current use of insulin (HCC) A1C 7.4.Pt has been out of medication for at least 2.5 months. Plan to restart metformin. Taper up gradually. Encouraged to check FBS, goal is 70-100.  - POCT glycosylated hemoglobin (Hb A1C) - Microalbumin / creatinine urine ratio - metFORMIN (GLUCOPHAGE) 1000 MG tablet; Take 1 tablet (1,000 mg total) by mouth 2 (two) times daily with a meal.  Dispense: 180 tablet; Refill: 1 - Lancets MISC; Test blood sugar once daily.  Dispense: 100 each; Refill: 2  2. Essential hypertension Asymptomatic. She was very stressed today due to not getting medication refills without an office visit. Has been out of medication. Instructed to check bp outside of office at least a few times per week. Return if consistently >140/90. Given strict ED precautions. Strongly encouraged smoking cessation. Educated on benefits of quitting smoking. She is not ready to quit at this time. Gave her educational info about this. Otherwise, follow up in 2 weeks for reevaluation and CPE. - CBC  with Differential/Platelet - CMP14+EGFR - TSH - POCT urinalysis dipstick - amLODipine (NORVASC) 10 MG tablet; Take 1 tablet (10 mg total) by mouth daily.  Dispense: 90 tablet; Refill: 1  3. Dyslipidemia Pt cannot tolerate lipitor. Can tolerate simvastatin. Will try crestor due to efficacy. If pt cannot tolerate, will Rx simvastatin.  - Lipid panel - rosuvastatin (CRESTOR) 10 MG tablet; Take 1 tablet (10 mg total) by mouth daily.  Dispense: 90 tablet; Refill: 3  4. Anxiety and depression Pt is more irritable today and was initially tearful. Otherwise, no sx of withdrawal noted. No SI. Rec restarting effexor at 44m x 3 days and if she tolerates well increasing to her normal dose of 1573mdaily.  - venlafaxine XR (EFFEXOR-XR) 150 MG 24 hr capsule; TAKE 1 CAPSULE BY MOUTH DAILY WITH BREAKFAST  Dispense: 90 capsule; Refill: 1  5. Bilateral  carotid artery stenosis Strongly advised pt to schedule f/u with cardiology and vascular surgery, as she is overdue for both.  - clopidogrel (PLAVIX) 75 MG tablet; Take 1 tablet (75 mg total) by mouth daily.  Dispense: 90 tablet; Refill: 1  6. Seasonal allergies - cetirizine (ZYRTEC) 10 MG tablet; Take 1 tablet (10 mg total) by mouth daily.  Dispense: 90 tablet; Refill: 3 - fluticasone (FLONASE) 50 MCG/ACT nasal spray; Place 2 sprays into both nostrils daily.  Dispense: 16 g; Refill: 3  8. Foreign body of right ear, initial encounter Successfully removed. Rec avoiding use of qtips. Contact our office if she develops any ear irritation.   A total of 40 was spent in the room with the patient, greater than 50% of which was in counseling/coordination of care regarding uncontrolled HTN, T2DM, and anxiety and depression.    BrTenna DelainePA-C  [facility] 06/09/2018 3:41 PM

## 2018-06-09 NOTE — Patient Instructions (Addendum)
Follow up with [facility] as soon as you can.  [address], [address], [place]  [address]  You need to also follow up with your vascular surgeon.   For high blood pressure, restart amlodipine 10mg  daily. Check bp outside the office. Ga[place] is <140/90.  If your values are consistently above this goal, please return to office for further evaluation. If you start to have chest pain, blurred vision, shortness of breath, severe headache, lower leg swelling, or nausea/vomiting please seek care immediately here or at the ED.   For diabetes, restart metformin. Taper up gradually to avoid side effects. Start with 500mg  once daily x 4 day. Increase to 500mg  twice daily x 4 days. Then go to 1000mg  in the morning and 500mg  in the evening x 4 days. Then increase to 1000mg  in the morning and evening. Take medication with food.  Common side effects of metformin include GI upset like nausea and diarrhea, which is why it is important to take with food. Start checking your blood sugars regularly. Check your sugars when you are fastin gin the morning before a meal.  Signs of hypoglycemia are lightheadedness, blurred vision, headache, and confusion.   For depression, start effexor 75 mg x 3 days then increase dose to 150mg  daily as long as you are tolerating it well.   I have given you refills for all the other medications.   We removed an object from your ear today. Please do not use any qtips for the next 2 weeks. Wash with soap and water. If you start to have any pain, itching, or hearing issues, please seek care immediately.   Please return in 2 weeks for reevaluation. Below is some info about smoking cessation.   Steps to Quit Smoking Smoking tobacco can be bad for your health. It can also affect almost every organ in your body. Smoking puts you and people around you at risk for many serious long-lasting (chronic) diseases. Quitting smoking is hard, but it is one of the best things  that you can do for your health. It is never too late to quit. What are the benefits of quitting smoking? When you quit smoking, you lower your risk for getting serious diseases and conditions. They can include:  Lung cancer or lung disease.  Heart disease.  Stroke.  Heart attack.  Not being able to have children (infertility).  Weak bones (osteoporosis) and broken bones (fractures).  If you have coughing, wheezing, and shortness of breath, those symptoms may get better when you quit. You may also get sick less often. If you are pregnant, quitting smoking can help to lower your chances of having a baby of low birth weight. What can I do to help me quit smoking? Talk with your doctor about what can help you quit smoking. Some things you can do (strategies) include:  Quitting smoking totally, instead of slowly cutting back how much you smoke over a period of time.  Going to in-person counseling. You are more likely to quit if you go to many counseling sessions.  Using resources and support systems, such as: ? Database administrator with a Social worker. ? Phone quitlines. ? Careers information officer. ? Support groups or group counseling. ? Text messaging programs. ? Mobile phone apps or applications.  Taking medicines. Some of these medicines may have nicotine in them. If you are pregnant or breastfeeding, do not take any medicines to quit smoking unless your doctor says it is okay. Talk with  your doctor about counseling or other things that can help you.  Talk with your doctor about using more than one strategy at the same time, such as taking medicines while you are also going to in-person counseling. This can help make quitting easier. What things can I do to make it easier to quit? Quitting smoking might feel very hard at first, but there is a lot that you can do to make it easier. Take these steps:  Talk to your family and friends. Ask them to support and encourage you.  Call phone  quitlines, reach out to support groups, or work with a Social worker.  Ask people who smoke to not smoke around you.  Avoid places that make you want (trigger) to smoke, such as: ? Bars. ? Parties. ? Smoke-break areas at work.  Spend time with people who do not smoke.  Lower the stress in your life. Stress can make you want to smoke. Try these things to help your stress: ? Getting regular exercise. ? Deep-breathing exercises. ? Yoga. ? Meditating. ? Doing a body scan. To do this, close your eyes, focus on one area of your body at a time from head to toe, and notice which parts of your body are tense. Try to relax the muscles in those areas.  Download or buy apps on your mobile phone or tablet that can help you stick to your quit plan. There are many free apps, such as QuitGuide from the [employer] Office manager for Disease Control and Prevention). You can find more support from smokefree.gov and other websites.  This information is not intended to replace advice given to you by your health care provider. Make sure you discuss any questions you have with your health care provider. Document Released: 10/04/2009 Document Revised: 08/05/2016 Document Reviewed: 04/24/2015 Elsevier Interactive Patient Education  2018 [employer].   IF you received an x-ray today, you will receive an invoice from [facility]. Please contact [facility] at [phone] with questions or concerns regarding your invoice.   IF you received labwork today, you will receive an invoice from [facility]. Please contact [facility] at [phone] with questions or concerns regarding your invoice.   Our billing staff will not be able to assist you with questions regarding bills from these companies.  You will be contacted with the lab results as soon as they are available. The fastest way to get your results is to activate your My Chart account. Instructions are located on the last page of this paperwork. If you have not  heard from [place] regarding the results in 2 weeks, please contact this office.

## 2018-06-10 ENCOUNTER — Telehealth: Payer: Self-pay | Admitting: Physician Assistant

## 2018-06-10 LAB — CBC WITH DIFFERENTIAL/PLATELET
BASOS ABS: 0.1 10*3/uL (ref 0.0–0.2)
BASOS: 1 %
EOS (ABSOLUTE): 0.3 10*3/uL (ref 0.0–0.4)
Eos: 3 %
Hematocrit: 42.9 % (ref 34.0–46.6)
Hemoglobin: 14.6 g/dL (ref 11.1–15.9)
Immature Grans (Abs): 0 10*3/uL (ref 0.0–0.1)
Immature Granulocytes: 0 %
LYMPHS ABS: 4 10*3/uL — AB (ref 0.7–3.1)
Lymphs: 41 %
MCH: 30.2 pg (ref 26.6–33.0)
MCHC: 34 g/dL (ref 31.5–35.7)
MCV: 89 fL (ref 79–97)
Monocytes Absolute: 0.7 10*3/uL (ref 0.1–0.9)
Monocytes: 7 %
NEUTROS ABS: 4.8 10*3/uL (ref 1.4–7.0)
Neutrophils: 48 %
PLATELETS: 257 10*3/uL (ref 150–450)
RBC: 4.83 x10E6/uL (ref 3.77–5.28)
RDW: 14.3 % (ref 12.3–15.4)
WBC: 9.9 10*3/uL (ref 3.4–10.8)

## 2018-06-10 LAB — CMP14+EGFR
A/G RATIO: 1.5 (ref 1.2–2.2)
ALBUMIN: 4.5 g/dL (ref 3.5–5.5)
ALK PHOS: 191 IU/L — AB (ref 39–117)
ALT: 8 IU/L (ref 0–32)
AST: 14 IU/L (ref 0–40)
BUN / CREAT RATIO: 14 (ref 9–23)
BUN: 7 mg/dL (ref 6–24)
Bilirubin Total: 0.3 mg/dL (ref 0.0–1.2)
CO2: 22 mmol/L (ref 20–29)
Calcium: 9.7 mg/dL (ref 8.7–10.2)
Chloride: 101 mmol/L (ref 96–106)
Creatinine, Ser: 0.51 mg/dL — ABNORMAL LOW (ref 0.57–1.00)
GFR calc non Af Amer: 109 mL/min/{1.73_m2} (ref >59)
GFR, EST AFRICAN AMERICAN: 126 mL/min/{1.73_m2} (ref >59)
GLUCOSE: 143 mg/dL — AB (ref 65–99)
Globulin, Total: 3 g/dL (ref 1.5–4.5)
POTASSIUM: 4.3 mmol/L (ref 3.5–5.2)
Sodium: 144 mmol/L (ref 134–144)
Total Protein: 7.5 g/dL (ref 6.0–8.5)

## 2018-06-10 LAB — LIPID PANEL
CHOLESTEROL TOTAL: 213 mg/dL — AB (ref 100–199)
Chol/HDL Ratio: 5.1 ratio — ABNORMAL HIGH (ref 0.0–4.4)
HDL: 42 mg/dL (ref >39)
LDL Calculated: 134 mg/dL — ABNORMAL HIGH (ref 0–99)
Triglycerides: 184 mg/dL — ABNORMAL HIGH (ref 0–149)
VLDL CHOLESTEROL CAL: 37 mg/dL (ref 5–40)

## 2018-06-10 LAB — MICROALBUMIN / CREATININE URINE RATIO
CREATININE, UR: 167.7 mg/dL
MICROALBUM., U, RANDOM: 15.8 ug/mL
Microalb/Creat Ratio: 9.4 mg/g creat (ref 0.0–30.0)

## 2018-06-10 LAB — TSH: TSH: 1.4 u[IU]/mL (ref 0.450–4.500)

## 2018-06-10 NOTE — Telephone Encounter (Signed)
Copied from [place] [phone]. Topic: Inquiry >> Jun 09, 2018  6:44 PM Oliver Pila B wrote: Reason for CRM: pt called to get an extension on her work note to be out of work tomorrow, pt states that the note can be sent on her mychart if possible, call pt to advise

## 2018-06-11 ENCOUNTER — Encounter: Payer: Self-pay | Admitting: Physician Assistant

## 2018-06-17 ENCOUNTER — Encounter: Payer: Self-pay | Admitting: Physician Assistant

## 2018-06-28 ENCOUNTER — Encounter: Payer: Self-pay | Admitting: Physician Assistant

## 2018-07-05 ENCOUNTER — Other Ambulatory Visit: Payer: Self-pay

## 2018-07-05 ENCOUNTER — Ambulatory Visit (INDEPENDENT_AMBULATORY_CARE_PROVIDER_SITE_OTHER): Payer: Medicare Other | Admitting: Physician Assistant

## 2018-07-05 ENCOUNTER — Encounter: Payer: Self-pay | Admitting: Physician Assistant

## 2018-07-05 VITALS — BP 129/74 | HR 95 | Temp 99.5°F | Resp 20 | Ht 63.58 in | Wt 129.4 lb

## 2018-07-05 DIAGNOSIS — Z720 Tobacco use: Secondary | ICD-10-CM

## 2018-07-05 DIAGNOSIS — Z124 Encounter for screening for malignant neoplasm of cervix: Secondary | ICD-10-CM | POA: Diagnosis not present

## 2018-07-05 DIAGNOSIS — R748 Abnormal levels of other serum enzymes: Secondary | ICD-10-CM

## 2018-07-05 DIAGNOSIS — F419 Anxiety disorder, unspecified: Secondary | ICD-10-CM

## 2018-07-05 DIAGNOSIS — Z1239 Encounter for other screening for malignant neoplasm of breast: Secondary | ICD-10-CM

## 2018-07-05 DIAGNOSIS — K5900 Constipation, unspecified: Secondary | ICD-10-CM

## 2018-07-05 DIAGNOSIS — I6523 Occlusion and stenosis of bilateral carotid arteries: Secondary | ICD-10-CM

## 2018-07-05 DIAGNOSIS — E785 Hyperlipidemia, unspecified: Secondary | ICD-10-CM

## 2018-07-05 DIAGNOSIS — Z0000 Encounter for general adult medical examination without abnormal findings: Secondary | ICD-10-CM | POA: Diagnosis not present

## 2018-07-05 DIAGNOSIS — Z1231 Encounter for screening mammogram for malignant neoplasm of breast: Secondary | ICD-10-CM

## 2018-07-05 DIAGNOSIS — I10 Essential (primary) hypertension: Secondary | ICD-10-CM

## 2018-07-05 DIAGNOSIS — E119 Type 2 diabetes mellitus without complications: Secondary | ICD-10-CM

## 2018-07-05 DIAGNOSIS — F329 Major depressive disorder, single episode, unspecified: Secondary | ICD-10-CM

## 2018-07-05 LAB — CMP14+EGFR
ALBUMIN: 4.4 g/dL (ref 3.5–5.5)
ALK PHOS: 132 IU/L — AB (ref 39–117)
ALT: 10 IU/L (ref 0–32)
AST: 12 IU/L (ref 0–40)
Albumin/Globulin Ratio: 1.3 (ref 1.2–2.2)
BUN / CREAT RATIO: 18 (ref 9–23)
BUN: 10 mg/dL (ref 6–24)
Bilirubin Total: <0.2 mg/dL (ref 0.0–1.2)
CALCIUM: 10 mg/dL (ref 8.7–10.2)
CO2: 25 mmol/L (ref 20–29)
CREATININE: 0.56 mg/dL — AB (ref 0.57–1.00)
Chloride: 103 mmol/L (ref 96–106)
GFR calc Af Amer: 122 mL/min/{1.73_m2} (ref >59)
GFR, EST NON AFRICAN AMERICAN: 106 mL/min/{1.73_m2} (ref >59)
GLUCOSE: 113 mg/dL — AB (ref 65–99)
Globulin, Total: 3.3 g/dL (ref 1.5–4.5)
Potassium: 5.2 mmol/L (ref 3.5–5.2)
Sodium: 142 mmol/L (ref 134–144)
TOTAL PROTEIN: 7.7 g/dL (ref 6.0–8.5)

## 2018-07-05 MED ORDER — ZOSTER VAC RECOMB ADJUVANTED 50 MCG/0.5ML IM SUSR: 0.5000 mL | Freq: Once | INTRAMUSCULAR | 0 refills | Status: AC

## 2018-07-05 MED ORDER — LISINOPRIL 2.5 MG PO TABS: 2.5000 mg | ORAL_TABLET | Freq: Every day | ORAL | 1 refills | Status: DC

## 2018-07-05 MED ORDER — SIMVASTATIN 20 MG PO TABS: 20.0000 mg | ORAL_TABLET | Freq: Every day | ORAL | 3 refills | Status: DC

## 2018-07-05 NOTE — Progress Notes (Signed)
Andrea [name]  MRN: [id number] DOB: [birth year]  Subjective:  Pt is a 52 y.o. female who presents for annual physical exam. Pt is fasting today.   Last dental exam: Years ago, has partial dentures. Last vision exam: 2018 Last pap smear: 2016 Last mammogram:2016 Last colonoscopy: 2016 Vaccinations      Tetanus: 2018      Pneumovax: 2018      Shingrix: Never   Diet: "whatever I want." Drinks at least 64 oz of water daily. Exercise: Never Sleep: ~6 hours a night BM: Once a week, has always been normal for her   Chronic medical conditions:  -RJJ:OACZYSAYT managed with norvasc 56m and lisinopril 2.526mdaily.  Patient is checking blood pressure at home, range is 12016Wystolically. Reports no symptoms. Denies lightheadedness, dizziness, chronic headache, double vision, chest pain, shortness of breath, heart racing, palpitations, nausea, vomiting, abdominal pain, hematuria, lower leg swelling. Lifestyle: Walking every day at work. Eating less.  Smokes 1 ppd. Not ready to quit. But knows how to stop.   -T2DM: Diagnosis was made2008. Patient is currently managed with metformin 100036mID. Last A1C was 05/2018, 7.4. Patientis notchecking home blood sugars regularly.Current symptoms include none. Patient deniesfoot ulcerations, nausea, paresthesia of the feet, polydipsia, polyuria, visual disturbances, vomiting and weight loss. Patientischecking their feet daily. Nofoot concerns. Last diabetic eye exam eye exam was 07/2017, it was normal.  -Bilateral carotid artery stenosis: Takes aspirin 70m21md plavix 75mg58mly. Has been on this regimen since 2018.  Has not followed up with cardiology and vascular surgery for at least 1 year.   Has follow-up with cardiology in August 2019.  -For anxiety and depression: Takes effexor-xr 150mg 58my, She has been on this since 2003. Doing much better than last visit. Denies irritability, insomnia, confusion, dry mouth, diaphoresis, headache,  nausea, vomiting, and abdominal pain. Denies SI and HI.   -For HLD: Did not tolerate Crestor.  Would like to try simvastatin again.   Patient Active Problem List   Diagnosis Date Noted  . Shingles 06/07/2016  . Bilateral carotid artery stenosis 10/18/2014  . TIA (transient ischemic attack) 08/29/2014  . HLD (hyperlipidemia) 08/29/2014  . DM (diabetes mellitus) with complications (HCC) 0[place]8/10/93/2355N (hypertension), benign 08/29/2014  . Stroke (HCC) 0[place]2/2015  . CVA (cerebral infarction) 06/22/2014  . PVD (peripheral vascular disease) (HCC) 0[place]2/2015  . Occlusion and stenosis of carotid artery without mention of cerebral infarction 04/12/2014    Current Outpatient Medications on File Prior to Visit  Medication Sig Dispense Refill  . amLODipine (NORVASC) 10 MG tablet Take 1 tablet (10 mg total) by mouth daily. 90 tablet 1  . aspirin 81 MG tablet Take 1 tablet (81 mg total) by mouth daily. 30 tablet 11  . cetirizine (ZYRTEC) 10 MG tablet Take 1 tablet (10 mg total) by mouth daily. 90 tablet 3  . clopidogrel (PLAVIX) 75 MG tablet Take 1 tablet (75 mg total) by mouth daily. 90 tablet 1  . fluticasone (FLONASE) 50 MCG/ACT nasal spray Place 2 sprays into both nostrils daily. 16 g 3  . Lancets MISC Test blood sugar once daily. 100 each 2  . metFORMIN (GLUCOPHAGE) 1000 MG tablet Take 1 tablet (1,000 mg total) by mouth 2 (two) times daily with a meal. 180 tablet 1  . venlafaxine XR (EFFEXOR XR) 75 MG 24 hr capsule Take 1 capsule (75 mg total) by mouth daily with breakfast. 3 capsule 0  . ondansetron (ZOFRAN) 4 MG tablet Take 1 tablet (  4 mg total) by mouth every 8 (eight) hours as needed for nausea or vomiting. (Patient not taking: Reported on 06/09/2018) 12 tablet 0  . oxyCODONE-acetaminophen (PERCOCET/ROXICET) 5-325 MG tablet Take 1 tablet by mouth every 6 (six) hours as needed for severe pain. (Patient not taking: Reported on 06/09/2018) 12 tablet 0  . tamsulosin (FLOMAX) 0.4 MG CAPS  capsule Take 1 capsule (0.4 mg total) by mouth daily. (Patient not taking: Reported on 06/09/2018) 7 capsule 0  . venlafaxine XR (EFFEXOR-XR) 150 MG 24 hr capsule TAKE 1 CAPSULE BY MOUTH DAILY WITH BREAKFAST (Patient not taking: Reported on 07/05/2018) 90 capsule 1   No current facility-administered medications on file prior to visit.     Allergies  Allergen Reactions  . Other Hives    Contrast dye     Social History   Socioeconomic History  . Marital status: Married    Spouse name: Not on file  . Number of children: 1  . Years of education: College  . Highest education level: Not on file  Occupational History  . Occupation: retired/disabled    Fish farm manager: UNEMPLOYED  Social Needs  . Financial resource strain: Not on file  . Food insecurity:    Worry: Not on file    Inability: Not on file  . Transportation needs:    Medical: Not on file    Non-medical: Not on file  Tobacco Use  . Smoking status: Current Every Day Smoker    Packs/day: 1.00    Years: 40.00    Pack years: 40.00    Types: Cigarettes  . Smokeless tobacco: Never Used  Substance and Sexual Activity  . Alcohol use: Yes    Alcohol/week: 0.0 oz    Comment: holidays/special occas 3-4 drinks  . Drug use: No  . Sexual activity: Not Currently    Partners: Male  Lifestyle  . Physical activity:    Days per week: 0 days    Minutes per session: 0 min  . Stress: To some extent  Relationships  . Social connections:    Talks on phone: More than three times a week    Gets together: Twice a week    Attends religious service: 1 to 4 times per year    Active member of club or organization: Yes    Attends meetings of clubs or organizations: 1 to 4 times per year    Relationship status: Married  Other Topics Concern  . Not on file  Social History Narrative   Pt is from [facility]   Patient lives at home with her husband and son   Patient is right handed   Patient drinks coffee daily    Past Surgical History:   Procedure Laterality Date  . 2 stents    . CAROTID ENDARTERECTOMY    . CESAREAN SECTION    . CHOLECYSTECTOMY    . EXTRACORPOREAL SHOCK WAVE LITHOTRIPSY Left 03/29/2018   Procedure: LEFT EXTRACORPOREAL SHOCK WAVE LITHOTRIPSY (ESWL);  Surgeon: Franchot Gallo, MD;  Location: [facility];  Service: Urology;  Laterality: Left;  . FEMORAL BYPASS     x 2 on right side due to re-stenosis  . left/right carotid artery     2003 and 2009 for right, right side is 100% occluded per patient; left side 2009  . TUBAL LIGATION      Family History  Problem Relation Age of Onset  . Diabetes Mother   . Heart disease Mother   . Hyperlipidemia Mother   . Hypertension Mother   .  Heart disease Father        MI, unknown age  . Hyperlipidemia Father   . Hypertension Father   . Diabetes Maternal Grandmother   . Heart disease Maternal Grandmother   . Hyperlipidemia Maternal Grandmother   . Cancer Paternal Grandmother   . Cancer Sister   . Varicose Veins Sister   . Heart attack Brother        Age 15    Review of Systems  Constitutional: Negative for activity change, appetite change, chills, diaphoresis, fatigue, fever and unexpected weight change.  HENT: Negative for congestion, dental problem, drooling, ear discharge, ear pain, facial swelling, hearing loss, mouth sores, nosebleeds, postnasal drip, rhinorrhea, sinus pressure, sinus pain, sneezing, sore throat, tinnitus, trouble swallowing and voice change.   Eyes: Negative for photophobia, pain, discharge, redness, itching and visual disturbance.  Respiratory: Negative for apnea, cough, choking, chest tightness, shortness of breath, wheezing and stridor.   Cardiovascular: Negative for chest pain, palpitations and leg swelling.  Gastrointestinal: Positive for constipation. Negative for abdominal distention, abdominal pain, anal bleeding, blood in stool, diarrhea, nausea, rectal pain and vomiting.  Endocrine: Negative for cold intolerance, heat intolerance,  polydipsia, polyphagia and polyuria.  Genitourinary: Negative for decreased urine volume, difficulty urinating, dyspareunia, dysuria, enuresis, flank pain, frequency, genital sores, hematuria, menstrual problem, pelvic pain, urgency, vaginal bleeding, vaginal discharge and vaginal pain.  Musculoskeletal: Negative for arthralgias, back pain, gait problem, joint swelling, myalgias, neck pain and neck stiffness.  Skin: Negative for color change, pallor, rash and wound.  Allergic/Immunologic: Negative for environmental allergies, food allergies and immunocompromised state.  Neurological: Negative for dizziness, tremors, seizures, syncope, facial asymmetry, speech difficulty, weakness, light-headedness, numbness and headaches.  Hematological: Negative for adenopathy. Does not bruise/bleed easily.  Psychiatric/Behavioral: Negative for agitation, behavioral problems, confusion, decreased concentration, dysphoric mood, hallucinations, self-injury, sleep disturbance and suicidal ideas. The patient is not nervous/anxious and is not hyperactive.     Objective:  BP 129/74 (BP Location: Left Arm, Patient Position: Sitting, Cuff Size: Normal)   Pulse 95   Temp 99.5 F (37.5 C) (Oral)   Resp 20   Ht 5' 3.58" (1.615 m)   Wt 129 lb 6.4 oz (58.7 kg)   SpO2 96%   BMI 22.50 kg/m   Physical Exam  Constitutional: She is oriented to person, place, and time. She appears well-developed and well-nourished. No distress.  HENT:  Head: Normocephalic and atraumatic.  Right Ear: Hearing, tympanic membrane, external ear and ear canal normal.  Left Ear: Hearing, tympanic membrane, external ear and ear canal normal.  Nose: Nose normal.  Mouth/Throat: Uvula is midline, oropharynx is clear and moist and mucous membranes are normal. Abnormal dentition ( poor dentition noted). No oropharyngeal exudate.  Eyes: Pupils are equal, round, and reactive to light. Conjunctivae, EOM and lids are normal. No scleral icterus.  Neck:  Trachea normal and normal range of motion. No thyroid mass and no thyromegaly present.  Cardiovascular: Normal rate, regular rhythm and intact distal pulses.  Murmur heard.  Systolic murmur is present. Bilateral carotid bruits noted. Bilateral CEA scars well healed.  Pulmonary/Chest: Effort normal. Right breast exhibits no inverted nipple, no mass, no nipple discharge, no skin change and no tenderness. Left breast exhibits no inverted nipple, no nipple discharge, no skin change and no tenderness.  Coarse breath sounds bilaterally.   Abdominal: Soft. Normal appearance and bowel sounds are normal. There is no tenderness.  Genitourinary: Vagina normal and uterus normal. Uterus is not deviated, not enlarged, not fixed and  not tender. Cervix exhibits no motion tenderness and no discharge. Right adnexum displays no mass, no tenderness and no fullness. Left adnexum displays no mass, no tenderness and no fullness.  Genitourinary Comments: CMA chaperone present for GU exam  Lymphadenopathy:       Head (right side): No tonsillar, no preauricular, no posterior auricular and no occipital adenopathy present.       Head (left side): No tonsillar, no preauricular, no posterior auricular and no occipital adenopathy present.    She has no cervical adenopathy.       Right: No supraclavicular adenopathy present.       Left: No supraclavicular adenopathy present.  Neurological: She is alert and oriented to person, place, and time. She has normal strength and normal reflexes.  Skin: Skin is warm and dry.    Visual Acuity Screening   Right eye Left eye Both eyes  Without correction:     With correction: _0    BP Readings from Last 3 Encounters:  07/05/18 129/74  06/09/18 (!) 154/77  03/29/18 140/69   Wt Readings from Last 3 Encounters:  07/05/18 129 lb 6.4 oz (58.7 kg)  06/09/18 128 lb 12.8 oz (58.4 kg)  03/29/18 134 lb (60.8 kg)   Diabetic Foot Exam - Simple   Simple Foot Form Visual  Inspection No deformities, no ulcerations, no other skin breakdown bilaterally:  Yes Sensation Testing Intact to touch and monofilament testing bilaterally:  Yes Pulse Check Posterior Tibialis and Dorsalis pulse intact bilaterally:  Yes Comments       Assessment and Plan :  Discussed healthy lifestyle, diet, exercise, preventative care, vaccinations, and addressed patient's concerns. Plan for follow up in 6 months. Otherwise, plan for specific conditions below.  1. Annual physical exam 2. Screening for cervical cancer - Pap IG and HPV (high risk) DNA detection  3. Screening for breast cancer - MM Digital Screening; Future  4. Type 2 diabetes mellitus without complication, without long-term current use of insulin ([place]) - [facility] Diabetes Foot Exam  5. Essential hypertension Well controlled. - lisinopril (PRINIVIL,ZESTRIL) 2.5 MG tablet; Take 1 tablet (2.5 mg total) by mouth daily.  Dispense: 90 tablet; Refill: 1  6. Dyslipidemia - simvastatin (ZOCOR) 20 MG tablet; Take 1 tablet (20 mg total) by mouth at bedtime.  Dispense: 90 tablet; Refill: 3  7. Anxiety and depression Well controlled..   8. Bilateral carotid artery stenosis  9. Elevated liver enzymes - CMP14+EGFR  10. Tobacco use Will plan for lung ca screen CT at age 60. Discussed smoking cessation. Educated on mechanisms to help quit smoking and on benefits of smoking cessation. Also discussed potential complications associated with continued smoking.  Pt is in pre contemplation phase.  - CT CHEST LUNG CA SCREEN LOW DOSE W/O CM; Future  11. Constipation Given educational material on dietary and OTC management of constipation. Follow up as needed.   Tenna Delaine, PA-C  [facility] 07/05/2018 11:12 AM

## 2018-07-05 NOTE — Patient Instructions (Addendum)
Did you know that you begin to benefit from quitting smoking within the first twenty minutes? It's TRUE.  At 20 minutes: -blood pressure decreases -pulse rate drops -body temperature of hands and feet increases  At 8 hours: -carbon monoxide level in blood drops to normal -oxygen level in blood increases to normal  At 24 hours: -the chance of heart attack decreases  At 48 hours: -nerve endings start regrowing -ability to smell and taste is enhanced  2 weeks-3 months: -circulation improves -walking becomes easier -lung function improves  1-9 months: -coughing, sinus congestion, fatigue and shortness of breath decreases  1 year: -excess risk of heart disease is decreased to HALF that of a smoker  5 years: Stroke risk is reduced to that of people who have never smoked  10 years: -risk of lung cancer drops to as little as half that of continuing smokers -risk of cancer of the mouth, throat, esophagus, bladder, kidney and pancreas decreases -risk of ulcer decreases  15 years -risk of heart disease is now similar to that of people who have never smoked -risk of death returns to nearly the level of people who have never smoked    What are the benefits of quitting smoking? - Quitting smoking can lower your chances of getting or dying from heart disease, lung disease, kidney failure, infection, or cancer. It can also lower your chances of getting osteoporosis, a condition that makes your bones weak. Plus, quitting smoking can help your skin look younger and reduce the chances that you will have problems with sex.  Quitting smoking will improve your health no matter how old you are, and no matter how long or how much you have smoked.  What should I do if I want to quit smoking? - The letters in the word "START" can help you remember the steps to take:  S = Set a quit date.  T = Tell family, friends, and the people around you that you plan to quit.  A = Anticipate or plan  ahead for the tough times you'll face while quitting.  R = Remove cigarettes and other tobacco products from your home, car, and work.  T = Talk to your doctor about getting help to quit.  How can my doctor or nurse help? - Your doctor or nurse can give you advice on the best way to quit. He or she can also put you in touch with counselors or other people you can call for support. Plus, your doctor or nurse can give you medicines to:  - Reduce your craving for cigarettes - Reduce the unpleasant symptoms that happen when you stop smoking (called "withdrawal symptoms").  You can also get help from a free phone line (1-800-QUIT-NOW) or go online to [employer].  What are the symptoms of withdrawal? - The symptoms include:  - Trouble sleeping - Being irritable, anxious or restless - Getting frustrated or angry - Having trouble thinking clearly Some people who stop smoking become temporarily depressed. Some of them need treatment for depression, such as counseling or antidepressant medicines. If you get depressed when you quit smoking, tell your doctor or nurse about it.  How do medicines help? - Different medicines work in different ways:  - Nicotine replacement therapy eases withdrawal and reduces your body's craving for nicotine, the main drug found in cigarettes. Non-prescription forms of nicotine replacement include skin patches, lozenges, and gum. Prescription forms include nasal sprays and "puffers" or inhalers. - Bupropion is a prescription medicine that reduces  your desire to smoke. This medicine is sold under the brand names Zyban and Wellbutrin. It is also available in a generic version, which is cheaper than brand-name medicines.  - Varenicline (brand name: Chantix) is a prescription medicine that reduces withdrawal symptoms and cigarette cravings. If you think you'd like to take varenicline and you have a history of depression, anxiety, or heart disease, discuss this with your  doctor or nurse before taking the medicine. Varenicline can also increase the effects of alcohol in some people. It's a good idea to limit drinking while you're taking it, at least until you know how it affects you. If you take bupropion or varenicline and you have any of the following symptoms, stop taking the medicine and call your doctor or nurse:  ? Become very nervous ? Become depressed ? Start to do strange things ? Think about killing yourself  How does counseling work? - Counseling can happen during formal office visits or just over the phone. A counselor can help you:  ? Figure out what triggers your smoking and what to do instead ? Overcome cravings ? Figure out what went wrong when you tried to quit before  What works best? - Studies show that people have the best luck at quitting if they take medicines to help them quit and work with a Social worker. It might also be helpful to combine nicotine replacement with one of the prescription medicines that help people quit. In some cases, it might even make sense to take bupropion and varenicline together.    Will I gain weight if I quit? - Yes, you might gain a few pounds. But quitting smoking will have a much more positive effect on your health than weighing a few pounds more. Plus, you can help prevent some weight gain by being more active and eating less. Taking the medicine bupropion might help control weight gain.  What else can I do to improve my chances of quitting? - You can:  ? Start exercising. ? Stay away from smokers and places that you associate with smoking. If people close to you smoke, ask them to quit with you. ? Keep gum, hard candy, or something to put in your mouth handy. If you get a craving for a cigarette, try one of these instead. ? Don't give up, even if you start smoking again. It takes most people a few tries before they succeed.   Health Maintenance for Postmenopausal Women Menopause is a normal process in  which your reproductive ability comes to an end. This process happens gradually over a span of months to years, usually between the ages of 27 and 12. Menopause is complete when you have missed 12 consecutive menstrual periods. It is important to talk with your health care provider about some of the most common conditions that affect postmenopausal women, such as heart disease, cancer, and bone loss (osteoporosis). Adopting a healthy lifestyle and getting preventive care can help to promote your health and wellness. Those actions can also lower your chances of developing some of these common conditions. What should I know about menopause? During menopause, you may experience a number of symptoms, such as:  Moderate-to-severe hot flashes.  Night sweats.  Decrease in sex drive.  Mood swings.  Headaches.  Tiredness.  Irritability.  Memory problems.  Insomnia.  Choosing to treat or not to treat menopausal changes is an individual decision that you make with your health care provider. What should I know about hormone replacement therapy and supplements?  Hormone therapy products are effective for treating symptoms that are associated with menopause, such as hot flashes and night sweats. Hormone replacement carries certain risks, especially as you become older. If you are thinking about using estrogen or estrogen with progestin treatments, discuss the benefits and risks with your health care provider. What should I know about heart disease and stroke? Heart disease, heart attack, and stroke become more likely as you age. This may be due, in part, to the hormonal changes that your body experiences during menopause. These can affect how your body processes dietary fats, triglycerides, and cholesterol. Heart attack and stroke are both medical emergencies. There are many things that you can do to help prevent heart disease and stroke:  Have your blood pressure checked at least every 1-2 years.  High blood pressure causes heart disease and increases the risk of stroke.  If you are 69-44 years old, ask your health care provider if you should take aspirin to prevent a heart attack or a stroke.  Do not use any tobacco products, including cigarettes, chewing tobacco, or electronic cigarettes. If you need help quitting, ask your health care provider.  It is important to eat a healthy diet and maintain a healthy weight. ? Be sure to include plenty of vegetables, fruits, low-fat dairy products, and lean protein. ? Avoid eating foods that are high in solid fats, added sugars, or salt (sodium).  Get regular exercise. This is one of the most important things that you can do for your health. ? Try to exercise for at least 150 minutes each week. The type of exercise that you do should increase your heart rate and make you sweat. This is known as moderate-intensity exercise. ? Try to do strengthening exercises at least twice each week. Do these in addition to the moderate-intensity exercise.  Know your numbers.Ask your health care provider to check your cholesterol and your blood glucose. Continue to have your blood tested as directed by your health care provider.  What should I know about cancer screening? There are several types of cancer. Take the following steps to reduce your risk and to catch any cancer development as early as possible. Breast Cancer  Practice breast self-awareness. ? This means understanding how your breasts normally appear and feel. ? It also means doing regular breast self-exams. Let your health care provider know about any changes, no matter how small.  If you are 59 or older, have a clinician do a breast exam (clinical breast exam or CBE) every year. Depending on your age, family history, and medical history, it may be recommended that you also have a yearly breast X-ray (mammogram).  If you have a family history of breast cancer, talk with your health care provider  about genetic screening.  If you are at high risk for breast cancer, talk with your health care provider about having an MRI and a mammogram every year.  Breast cancer (BRCA) gene test is recommended for women who have family members with BRCA-related cancers. Results of the assessment will determine the need for genetic counseling and BRCA1 and for BRCA2 testing. BRCA-related cancers include these types: ? Breast. This occurs in males or females. ? Ovarian. ? Tubal. This may also be called fallopian tube cancer. ? Cancer of the abdominal or pelvic lining (peritoneal cancer). ? Prostate. ? Pancreatic.  Cervical, Uterine, and Ovarian Cancer Your health care provider may recommend that you be screened regularly for cancer of the pelvic organs. These include your ovaries, uterus,  and vagina. This screening involves a pelvic exam, which includes checking for microscopic changes to the surface of your cervix (Pap test).  For women ages 21-65, health care providers may recommend a pelvic exam and a Pap test every three years. For women ages 25-65, they may recommend the Pap test and pelvic exam, combined with testing for human papilloma virus (HPV), every five years. Some types of HPV increase your risk of cervical cancer. Testing for HPV may also be done on women of any age who have unclear Pap test results.  Other health care providers may not recommend any screening for nonpregnant women who are considered low risk for pelvic cancer and have no symptoms. Ask your health care provider if a screening pelvic exam is right for you.  If you have had past treatment for cervical cancer or a condition that could lead to cancer, you need Pap tests and screening for cancer for at least 20 years after your treatment. If Pap tests have been discontinued for you, your risk factors (such as having a new sexual partner) need to be reassessed to determine if you should start having screenings again. Some women have  medical problems that increase the chance of getting cervical cancer. In these cases, your health care provider may recommend that you have screening and Pap tests more often.  If you have a family history of uterine cancer or ovarian cancer, talk with your health care provider about genetic screening.  If you have vaginal bleeding after reaching menopause, tell your health care provider.  There are currently no reliable tests available to screen for ovarian cancer.  Lung Cancer Lung cancer screening is recommended for adults 22-39 years old who are at high risk for lung cancer because of a history of smoking. A yearly low-dose CT scan of the lungs is recommended if you:  Currently smoke.  Have a history of at least 30 pack-years of smoking and you currently smoke or have quit within the past 15 years. A pack-year is smoking an average of one pack of cigarettes per day for one year.  Yearly screening should:  Continue until it has been 15 years since you quit.  Stop if you develop a health problem that would prevent you from having lung cancer treatment.  Colorectal Cancer  This type of cancer can be detected and can often be prevented.  Routine colorectal cancer screening usually begins at age 58 and continues through age 65.  If you have risk factors for colon cancer, your health care provider may recommend that you be screened at an earlier age.  If you have a family history of colorectal cancer, talk with your health care provider about genetic screening.  Your health care provider may also recommend using home test kits to check for hidden blood in your stool.  A small camera at the end of a tube can be used to examine your colon directly (sigmoidoscopy or colonoscopy). This is done to check for the earliest forms of colorectal cancer.  Direct examination of the colon should be repeated every 5-10 years until age 107. However, if early forms of precancerous polyps or small  growths are found or if you have a family history or genetic risk for colorectal cancer, you may need to be screened more often.  Skin Cancer  Check your skin from head to toe regularly.  Monitor any moles. Be sure to tell your health care provider: ? About any new moles or changes in moles,  especially if there is a change in a mole's shape or color. ? If you have a mole that is larger than the size of a pencil eraser.  If any of your family members has a history of skin cancer, especially at a young age, talk with your health care provider about genetic screening.  Always use sunscreen. Apply sunscreen liberally and repeatedly throughout the day.  Whenever you are outside, protect yourself by wearing long sleeves, pants, a wide-brimmed hat, and sunglasses.  What should I know about osteoporosis? Osteoporosis is a condition in which bone destruction happens more quickly than new bone creation. After menopause, you may be at an increased risk for osteoporosis. To help prevent osteoporosis or the bone fractures that can happen because of osteoporosis, the following is recommended:  If you are 17-65 years old, get at least 1,000 mg of calcium and at least 600 mg of vitamin D per day.  If you are older than age 37 but younger than age 46, get at least 1,200 mg of calcium and at least 600 mg of vitamin D per day.  If you are older than age 40, get at least 1,200 mg of calcium and at least 800 mg of vitamin D per day.  Smoking and excessive alcohol intake increase the risk of osteoporosis. Eat foods that are rich in calcium and vitamin D, and do weight-bearing exercises several times each week as directed by your health care provider. What should I know about how menopause affects my mental health? Depression may occur at any age, but it is more common as you become older. Common symptoms of depression include:  Low or sad mood.  Changes in sleep patterns.  Changes in appetite or eating  patterns.  Feeling an overall lack of motivation or enjoyment of activities that you previously enjoyed.  Frequent crying spells.  Talk with your health care provider if you think that you are experiencing depression. What should I know about immunizations? It is important that you get and maintain your immunizations. These include:  Tetanus, diphtheria, and pertussis (Tdap) booster vaccine.  Influenza every year before the flu season begins.  Pneumonia vaccine.  Shingles vaccine.  Your health care provider may also recommend other immunizations. This information is not intended to replace advice given to you by your health care provider. Make sure you discuss any questions you have with your health care provider. Document Released: 01/30/2006 Document Revised: 06/27/2016 Document Reviewed: 09/11/2015 Elsevier Interactive Patient Education  2018 [employer].   IF you received an x-ray today, you will receive an invoice from [facility]. Please contact [facility] at [phone] with questions or concerns regarding your invoice.   IF you received labwork today, you will receive an invoice from [facility]. Please contact [facility] at [phone] with questions or concerns regarding your invoice.   Our billing staff will not be able to assist you with questions regarding bills from these companies.  You will be contacted with the lab results as soon as they are available. The fastest way to get your results is to activate your My Chart account. Instructions are located on the last page of this paperwork. If you have not heard from [place] regarding the results in 2 weeks, please contact this office.

## 2018-07-06 NOTE — Addendum Note (Signed)
Addended by: Benson Setting L on: 07/06/2018 09:12 AM   Modules accepted: Orders

## 2018-07-07 LAB — GAMMA GT: GGT: 29 IU/L (ref 0–60)

## 2018-07-07 LAB — PAP IG AND HPV HIGH-RISK
HPV, HIGH-RISK: NEGATIVE
PAP Smear Comment: 0

## 2018-07-13 ENCOUNTER — Other Ambulatory Visit: Payer: Self-pay | Admitting: Physician Assistant

## 2018-07-13 DIAGNOSIS — R748 Abnormal levels of other serum enzymes: Secondary | ICD-10-CM

## 2018-07-13 NOTE — Progress Notes (Signed)
Orders Placed This Encounter  Procedures  . US Abdomen Limited RUQ    Standing Status:   Future    Standing Expiration Date:   10/13/2018    Order Specific Question:   Reason for Exam (SYMPTOM  OR DIAGNOSIS REQUIRED)    Answer:   elevated alk phos    Order Specific Question:   Preferred imaging location?    Answer:   External  . Hepatitis B surface antibody    Standing Status:   Future    Standing Expiration Date:   10/13/2018  . Hepatitis panel, acute    Standing Status:   Future    Standing Expiration Date:   10/13/2018    

## 2018-07-22 ENCOUNTER — Ambulatory Visit: Payer: Medicare Other | Admitting: Physician Assistant

## 2018-07-23 ENCOUNTER — Encounter: Payer: Self-pay | Admitting: Physician Assistant

## 2018-07-23 NOTE — Progress Notes (Signed)
Cardiology Office Note    Date:  07/26/2018  ID:  Andrea [name], DOB [birth year], MRN [id number] PCP:  Leonie Douglas, PA-C  Cardiologist:  Mertie Moores, MD   Chief Complaint: f/u cardiovascular screening  History of Present Illness:  Andrea [name] is a 52 y.o. female with history of HTN, HLD, DM2, bilateral carotid disease s/p prior endarterectomies with known RICA occlusion, and PVD s/p fem-fem BPG (followed by [facility]), anxiety, depression, possible prior TIA 2015, longstanding tobacco since age 58 who presents for routine follow-up.  She was remotely evaluated by a nuclear stress test in 2003 which showed mild anteroapical thinning felt due to breast attenuation artifact. She has prior entry of atrial fib into her chart but the patient denies this. Neuro notes in 2015 indicates she had reported these were ectopic beats only. She was evaluated by cardiologist in [facility] remotely and told she only had mild leakiness of a heart valve with a heart murmur. I saw her for cardiovascular screening given her vascular history and risk factors. EKG showed inferior changes at baseline. She also reported rare "flip" that sounded like PAC/PVCs. Dr. Acie Fredrickson did not feel ischemic testing was warranted given asymptomatic nature but did suggest echo for LV evaluation and systolic murmur. This was done 04/2017 showing EF 65-70%, grade 1 DD, moderate LAE, mild TR. Statin was titrated and she followed up with PCP for subsequent monitoring. She did not tolerate atorvastatin due to muscle cramping. She was tried on rosuvastatin by primary care but had nausea/vomiting so was changed back to simvastatin. Last labs 05/2018 showed trig 184, LDL 134, K 4.3, Cr 0.52, Alk phos chronically elevated, CBC WNL.  She returns for follow-up today overall having done well this year from a cardiac standpoint. No CP, dyspnea. Palpitations have been quiescent. No edema. She unfortunately continues to smoke. She recalls  successfully quitting for 5 years at one point after a vascular physician refused to see her again with ongoing tobacco. She walks regularly without angina.   Past Medical History:  Diagnosis Date  . Allergy   . Anemia   . Anxiety    on Alprazolam  . Arthritis    both hips  . Cancer ([place])    skin cancer  . Carotid artery occlusion    a. s/p prior endarterectomies with known RICA occlusion - followed by [facility].  . Depression    on Effexor  . Diabetes mellitus with circulatory complication ([place])    9518  . Diastolic dysfunction without heart failure   . Heart murmur    was seen by cardiologist in [facility] 2013  . History of kidney stones   . Hyperlipidemia   . Hypertension   . Peripheral vascular disease ([place])   . PVD (peripheral vascular disease) ([place])    a. s/p prior LE bypass grafting and revision (followed by [facility]).  Marland Kitchen TIA (transient ischemic attack)    a. possible mini stroke in 2015 - was told she did not have full stroke.    Past Surgical History:  Procedure Laterality Date  . 2 stents    . CAROTID ENDARTERECTOMY    . CESAREAN SECTION    . CHOLECYSTECTOMY    . EXTRACORPOREAL SHOCK WAVE LITHOTRIPSY Left 03/29/2018   Procedure: LEFT EXTRACORPOREAL SHOCK WAVE LITHOTRIPSY (ESWL);  Surgeon: Franchot Gallo, MD;  Location: [facility];  Service: Urology;  Laterality: Left;  . FEMORAL BYPASS     x 2 on right side due to re-stenosis  .  left/right carotid artery     2003 and 2009 for right, right side is 100% occluded per patient; left side 2009  . TUBAL LIGATION      Current Medications: Current Meds  Medication Sig  . amLODipine (NORVASC) 10 MG tablet Take 1 tablet (10 mg total) by mouth daily.  Marland Kitchen aspirin 81 MG tablet Take 1 tablet (81 mg total) by mouth daily.  . cetirizine (ZYRTEC) 10 MG tablet Take 1 tablet (10 mg total) by mouth daily.  . clopidogrel (PLAVIX) 75 MG tablet Take 1 tablet (75 mg total) by mouth daily.  . fluticasone (FLONASE) 50 MCG/ACT nasal spray  Place 2 sprays into both nostrils daily.  . Lancets MISC Test blood sugar once daily.  Marland Kitchen lisinopril (PRINIVIL,ZESTRIL) 2.5 MG tablet Take 1 tablet (2.5 mg total) by mouth daily.  . metFORMIN (GLUCOPHAGE) 1000 MG tablet Take 1 tablet (1,000 mg total) by mouth 2 (two) times daily with a meal.  . simvastatin (ZOCOR) 20 MG tablet Take 1 tablet (20 mg total) by mouth at bedtime.    Allergies:   Other   Social History   Socioeconomic History  . Marital status: Married    Spouse name: Not on file  . Number of children: 1  . Years of education: College  . Highest education level: Not on file  Occupational History  . Occupation: retired/disabled    Fish farm manager: UNEMPLOYED  Social Needs  . Financial resource strain: Not on file  . Food insecurity:    Worry: Not on file    Inability: Not on file  . Transportation needs:    Medical: Not on file    Non-medical: Not on file  Tobacco Use  . Smoking status: Current Every Day Smoker    Packs/day: 1.00    Years: 40.00    Pack years: 40.00    Types: Cigarettes  . Smokeless tobacco: Never Used  Substance and Sexual Activity  . Alcohol use: Yes    Alcohol/week: 0.0 oz    Comment: holidays/special occas 3-4 drinks  . Drug use: No  . Sexual activity: Not Currently    Partners: Male  Lifestyle  . Physical activity:    Days per week: 0 days    Minutes per session: 0 min  . Stress: To some extent  Relationships  . Social connections:    Talks on phone: More than three times a week    Gets together: Twice a week    Attends religious service: 1 to 4 times per year    Active member of club or organization: Yes    Attends meetings of clubs or organizations: 1 to 4 times per year    Relationship status: Married  Other Topics Concern  . Not on file  Social History Narrative   Pt is from [facility]   Patient lives at home with her husband and son   Patient is right handed   Patient drinks coffee daily     Family History:  The  patient's family history includes Cancer in her paternal grandmother and sister; Diabetes in her maternal grandmother and mother; Heart attack in her brother; Heart disease in her father, maternal grandmother, and mother; Hyperlipidemia in her father, maternal grandmother, and mother; Hypertension in her father and mother; Varicose Veins in her sister.  ROS:   Please see the history of present illness.   All other systems are reviewed and otherwise negative.    PHYSICAL EXAM:   VS:  BP 120/70  Pulse 64   Ht 5' 3" (1.6 m)   Wt 131 lb (59.4 kg)   SpO2 100%   BMI 23.21 kg/m   BMI: Body mass index is 23.21 kg/m. GEN: Well nourished, well developed WF, in no acute distress HEENT: normocephalic, atraumatic Neck: no JVD or masses. R carotid scar and associated bruit. No bruit L side. Cardiac: RRR; soft SEM. No rubs or gallops, no edema, 1+ pp bilaterally Respiratory:  clear to auscultation bilaterally, normal work of breathing GI: soft, nontender, nondistended, + BS MS: no deformity or atrophy Skin: warm and dry, no rash Neuro:  Alert and Oriented x 3, Strength and sensation are intact, follows commands Psych: euthymic mood, full affect  Wt Readings from Last 3 Encounters:  07/26/18 131 lb (59.4 kg)  07/05/18 129 lb 6.4 oz (58.7 kg)  06/09/18 128 lb 12.8 oz (58.4 kg)      Studies/Labs Reviewed:   EKG:  EKG was ordered today and personally reviewed by me and demonstrates NSR 63bpm, short PR interval, inferior TW changes similar to prior -overall stable  Recent Labs: 06/09/2018: Hemoglobin 14.6; Platelets 257; TSH 1.400 07/05/2018: ALT 10; BUN 10; Creatinine, Ser 0.56; Potassium 5.2; Sodium 142   Lipid Panel    Component Value Date/Time   CHOL 213 (H) 06/09/2018 1516   TRIG 184 (H) 06/09/2018 1516   HDL 42 06/09/2018 1516   CHOLHDL 5.1 (H) 06/09/2018 1516   CHOLHDL 6.7 (H) 08/13/2015 1057   VLDL 62 (H) 08/13/2015 1057   LDLCALC 134 (H) 06/09/2018 1516    Additional  studies/ records that were reviewed today include: Summarized above.    ASSESSMENT & PLAN:   1. Palpitations - quiescent over the last year. Previous palpitations have sounded more like rare PAC or PVC. Short PR noted on EKG which is stable. She has not had any documented tachy arrhythmias. Continue to observe. 2. Essential HTN - controlled. 3. Hyperlipidemia - recheck lipid profile/LFTs today. If alk phos continues to be evaluated, may need hepatic evaluation as this has been intermittently elevated in the past as well. Her goal LDL is <70 given extensive PAD history. She has been intolerant of atorvastatin and rosuvastatin. If LDL is >70, would suggest referral to the lipid clinic to discuss adjunctive therapies such as Zetia or PCSK-9 inhibitors. 4. PAD - needs to get back into see vascular surgery. Used to follow q6 months. Denies claudication or any acute issues. Have recommended she contact their office to be seen. 5. Family history of heart disease - last year the patient was evaluated by Dr. Acie Fredrickson who did not feel any ischemic testing was needed given that she has no evidence of angina. She is stable compared to last year. Continued risk factor reduction is indicated as outlined above. Patient was encouraged to notify us of any new symptoms. 6. Systolic murmur - mild TR noted on echo 04/2017, otherwise no significant valvular disease. Continue to monitor.  Disposition: F/u with Dr. Acie Fredrickson in 1 year.   Medication Adjustments/Labs and Tests Ordered: Current medicines are reviewed at length with the patient today.  Concerns regarding medicines are outlined above. Medication changes, Labs and Tests ordered today are summarized above and listed in the Patient Instructions accessible in Encounters.   Signed, Charlie Pitter, PA-C  07/26/2018 9:58 AM    [address], [place], [place]  [address] Phone: [phone]; Fax: [phone]

## 2018-07-26 ENCOUNTER — Ambulatory Visit: Payer: Medicare Other | Admitting: Physician Assistant

## 2018-07-26 ENCOUNTER — Encounter: Payer: Self-pay | Admitting: Physician Assistant

## 2018-07-26 VITALS — BP 120/70 | HR 64 | Ht 63.0 in | Wt 131.0 lb

## 2018-07-26 DIAGNOSIS — E785 Hyperlipidemia, unspecified: Secondary | ICD-10-CM

## 2018-07-26 DIAGNOSIS — R002 Palpitations: Secondary | ICD-10-CM | POA: Diagnosis not present

## 2018-07-26 DIAGNOSIS — I739 Peripheral vascular disease, unspecified: Secondary | ICD-10-CM | POA: Diagnosis not present

## 2018-07-26 DIAGNOSIS — I10 Essential (primary) hypertension: Secondary | ICD-10-CM | POA: Diagnosis not present

## 2018-07-26 DIAGNOSIS — R011 Cardiac murmur, unspecified: Secondary | ICD-10-CM

## 2018-07-26 DIAGNOSIS — Z8249 Family history of ischemic heart disease and other diseases of the circulatory system: Secondary | ICD-10-CM

## 2018-07-26 LAB — HEPATIC FUNCTION PANEL
ALT: 8 IU/L (ref 0–32)
AST: 15 IU/L (ref 0–40)
Albumin: 4.5 g/dL (ref 3.5–5.5)
Alkaline Phosphatase: 120 IU/L — ABNORMAL HIGH (ref 39–117)
BILIRUBIN, DIRECT: 0.06 mg/dL (ref 0.00–0.40)
Bilirubin Total: <0.2 mg/dL (ref 0.0–1.2)
TOTAL PROTEIN: 7.4 g/dL (ref 6.0–8.5)

## 2018-07-26 LAB — LIPID PANEL
CHOL/HDL RATIO: 4.5 ratio — AB (ref 0.0–4.4)
Cholesterol, Total: 171 mg/dL (ref 100–199)
HDL: 38 mg/dL — AB (ref >39)
LDL CALC: 96 mg/dL (ref 0–99)
Triglycerides: 187 mg/dL — ABNORMAL HIGH (ref 0–149)
VLDL Cholesterol Cal: 37 mg/dL (ref 5–40)

## 2018-07-26 NOTE — Patient Instructions (Addendum)
Medication Instructions:   Your physician recommends that you continue on your current medications as directed. Please refer to the Current Medication list given to you today.   If you need a refill on your cardiac medications before your next appointment, please call your pharmacy.  Labwork: LFT AND LIPIDS TODAY    Testing/Procedures: NONE ORDERED  TODAY    Follow-Up: Your physician wants you to follow-up in: [place] will receive a reminder letter in the mail two months in advance. If you don't receive a letter, please call our office to schedule the follow-up appointment.     Any Other Special Instructions Will Be Listed Below (If Applicable).  MAKE SURE YOU FOLLOW UP WITH VASCULAR SURGERY

## 2018-08-02 ENCOUNTER — Encounter: Payer: Self-pay | Admitting: Physician Assistant

## 2018-08-30 ENCOUNTER — Other Ambulatory Visit: Payer: Self-pay | Admitting: Physician Assistant

## 2018-08-30 DIAGNOSIS — Z720 Tobacco use: Secondary | ICD-10-CM

## 2018-10-15 ENCOUNTER — Ambulatory Visit (INDEPENDENT_AMBULATORY_CARE_PROVIDER_SITE_OTHER): Payer: Medicare Other | Admitting: Physician Assistant

## 2018-10-15 DIAGNOSIS — Z23 Encounter for immunization: Secondary | ICD-10-CM | POA: Diagnosis not present

## 2018-10-16 ENCOUNTER — Encounter: Payer: Self-pay | Admitting: Physician Assistant

## 2018-11-02 ENCOUNTER — Ambulatory Visit: Payer: Medicare Other | Admitting: Physician Assistant

## 2018-11-10 ENCOUNTER — Other Ambulatory Visit: Payer: Self-pay

## 2018-11-10 ENCOUNTER — Encounter: Payer: Self-pay | Admitting: Physician Assistant

## 2018-11-10 ENCOUNTER — Ambulatory Visit: Payer: Medicare Other | Admitting: Physician Assistant

## 2018-11-10 VITALS — BP 131/69 | HR 80 | Temp 98.9°F | Resp 18 | Ht 63.98 in | Wt 130.0 lb

## 2018-11-10 DIAGNOSIS — J302 Other seasonal allergic rhinitis: Secondary | ICD-10-CM

## 2018-11-10 DIAGNOSIS — I10 Essential (primary) hypertension: Secondary | ICD-10-CM

## 2018-11-10 DIAGNOSIS — Z95828 Presence of other vascular implants and grafts: Secondary | ICD-10-CM

## 2018-11-10 DIAGNOSIS — F419 Anxiety disorder, unspecified: Secondary | ICD-10-CM

## 2018-11-10 DIAGNOSIS — F329 Major depressive disorder, single episode, unspecified: Secondary | ICD-10-CM

## 2018-11-10 DIAGNOSIS — I6523 Occlusion and stenosis of bilateral carotid arteries: Secondary | ICD-10-CM

## 2018-11-10 DIAGNOSIS — E119 Type 2 diabetes mellitus without complications: Secondary | ICD-10-CM | POA: Diagnosis not present

## 2018-11-10 DIAGNOSIS — I6522 Occlusion and stenosis of left carotid artery: Secondary | ICD-10-CM

## 2018-11-10 DIAGNOSIS — R748 Abnormal levels of other serum enzymes: Secondary | ICD-10-CM

## 2018-11-10 DIAGNOSIS — I6521 Occlusion and stenosis of right carotid artery: Secondary | ICD-10-CM

## 2018-11-10 DIAGNOSIS — I779 Disorder of arteries and arterioles, unspecified: Secondary | ICD-10-CM

## 2018-11-10 LAB — POCT GLYCOSYLATED HEMOGLOBIN (HGB A1C): Hemoglobin A1C: 6.5 % — AB (ref 4.0–5.6)

## 2018-11-10 MED ORDER — AMLODIPINE BESYLATE 10 MG PO TABS: 10.0000 mg | ORAL_TABLET | Freq: Every day | ORAL | 3 refills | Status: DC

## 2018-11-10 MED ORDER — METFORMIN HCL 1000 MG PO TABS: 1000.0000 mg | ORAL_TABLET | Freq: Two times a day (BID) | ORAL | 3 refills | Status: AC

## 2018-11-10 MED ORDER — LISINOPRIL 2.5 MG PO TABS: 2.5000 mg | ORAL_TABLET | Freq: Every day | ORAL | 3 refills | Status: AC

## 2018-11-10 MED ORDER — VENLAFAXINE HCL ER 150 MG PO CP24: ORAL_CAPSULE | ORAL | 3 refills | Status: DC

## 2018-11-10 MED ORDER — CETIRIZINE HCL 10 MG PO TABS: 10.0000 mg | ORAL_TABLET | Freq: Every day | ORAL | 3 refills | Status: DC

## 2018-11-10 MED ORDER — FLUTICASONE PROPIONATE 50 MCG/ACT NA SUSP: 2.0000 | Freq: Every day | NASAL | 3 refills | Status: DC

## 2018-11-10 MED ORDER — CLOPIDOGREL BISULFATE 75 MG PO TABS: 75.0000 mg | ORAL_TABLET | Freq: Every day | ORAL | 3 refills | Status: DC

## 2018-11-10 NOTE — Progress Notes (Signed)
MRN: [id number] DOB: 1965-12-25  Subjective:   Andrea [name] is a 52 y.o. female presenting for chief complaint of Medication Management (f/u)  -NWG:NFAOZHYQM managed withnorvasc 39m and lisinopril 2.534mdaily. Patient is notchecking blood pressure at home.. Denies lightheadedness, dizziness, chronic headache, double vision, chest pain, shortness of breath, heart racing, palpitations, nausea, vomiting, abdominal pain, hematuria, lower leg swelling. Lifestyle:Walking every day at work. Eating less. Smokes 1 ppd. Not ready to quit. But knows how to stop.   -T2DM:Diagnosis was made2008. Patient is currently managed with metformin 100014mID.Last A1C was 05/2018, 7.4.Patientis notchecking home blood sugars regularly, sometimes does and it is 80.Current symptoms include none. Patient deniesfoot ulcerations, nausea, paresthesia of the feet, polydipsia, polyuria, visual disturbances, vomiting and weight loss. Patientischecking their feet daily. Nofoot concerns. Last diabetic eye exam eye exam was08/2019, it was normal.  -Bilateral carotid artery stenosis: Takes aspirin81m82md plavix 75mg32mly. Has been on this regimen since 2018. Has not followed up vascular surgery for at least 1 year but knows of one she is willing to see.   -Anxiety anddepression: Takes effexor-xr 150mg 78my, She has been on this since 2003. "Doing fine, as long as I do not run out." Denies irritability, insomnia, confusion, dry mouth, diaphoresis,headache, nausea, vomiting, and abdominal pain. Denies SI and HI.   -HLD: Taking imvastatin 20mg d47m.  Last lipid panel was obtained by cardiology 3 months ago.  Tolerating medication well.  Andrea Karmelcurrent medication list which includes the following prescription(s): amlodipine, aspirin, cetirizine, clopidogrel, fluticasone, lancets, lisinopril, metformin, simvastatin, and venlafaxine xr. Also is allergic to atorvastatin; other; and  rosuvastatin.  Andrea [name] past medical history of Allergy, Anemia, Anxiety, Arthritis, Cancer (HCC), CBrootentid artery occlusion, Depression, Diabetes mellitus with circulatory complication (HCC), DMarble Rocktolic dysfunction without heart failure, Heart murmur, History of kidney stones, Hyperlipidemia, Hypertension, Peripheral vascular disease (HCC), PPella(peripheral vascular disease) (HCC), [name]. Also  has a past surgical history that includes Cholecystectomy; Cesarean section; Tubal ligation; Femoral bypass; left/right carotid artery; 2 stents; Carotid endarterectomy; and Extracorporeal shock wave lithotripsy (Left, 03/29/2018).   Objective:   Vitals: BP 131/69   Pulse 80   Temp 98.9 F (37.2 C) (Oral)   Resp 18   Ht 5' 3.98" (1.625 m)   Wt 130 lb (59 kg)   SpO2 97%   BMI 22.33 kg/m   Physical Exam  Constitutional: She is oriented to person, place, and time. She appears well-developed and well-nourished. No distress.  HENT:  Head: Normocephalic and atraumatic.  Mouth/Throat: Uvula is midline, oropharynx is clear and moist and mucous membranes are normal.  Eyes: Pupils are equal, round, and reactive to light. Conjunctivae and EOM are normal.  Neck: Normal range of motion.  Cardiovascular: Normal rate, regular rhythm, normal heart sounds and intact distal pulses.  Pulmonary/Chest: Effort normal and breath sounds normal. She has no wheezes. She has no rhonchi. She has no rales.  Musculoskeletal:       Right lower leg: She exhibits no swelling.       Left lower leg: She exhibits no swelling.  Neurological: She is alert and oriented to person, place, and time.  Skin: Skin is warm and dry.  Psychiatric: She has a normal mood and affect.  Vitals reviewed.    Assessment and Plan :  1. Type 2 diabetes mellitus without complication, without long-term current use of insulin (HCC) A1C has improved from 7.4 five months ago to 6.5 today.  Recommend continuing current  medication regimen at this time.  Follow-up in 6 months for evaluation. - POCT glycosylated hemoglobin (Hb A1C) - metFORMIN (GLUCOPHAGE) 1000 MG tablet; Take 1 tablet (1,000 mg total) by mouth 2 (two) times daily with a meal.  Dispense: 180 tablet; Refill: 3  2. Bilateral carotid artery stenosis - Ambulatory referral to Vascular Surgery - clopidogrel (PLAVIX) 75 MG tablet; Take 1 tablet (75 mg total) by mouth daily.  Dispense: 90 tablet; Refill: 3  3. Essential hypertension Controlled.  Continue current medication regimen. - lisinopril (PRINIVIL,ZESTRIL) 2.5 MG tablet; Take 1 tablet (2.5 mg total) by mouth daily.  Dispense: 90 tablet; Refill: 3 - amLODipine (NORVASC) 10 MG tablet; Take 1 tablet (10 mg total) by mouth daily.  Dispense: 90 tablet; Refill: 3  4. Seasonal allergies - cetirizine (ZYRTEC) 10 MG tablet; Take 1 tablet (10 mg total) by mouth daily.  Dispense: 90 tablet; Refill: 3 - fluticasone (FLONASE) 50 MCG/ACT nasal spray; Place 2 sprays into both nostrils daily.  Dispense: 16 g; Refill: 3  5. Anxiety and depression Controlled.  Continue current medication regimen. - venlafaxine XR (EFFEXOR-XR) 150 MG 24 hr capsule; TAKE 1 CAPSULE BY MOUTH DAILY WITH BREAKFAST  Dispense: 90 capsule; Refill: 3  6. Elevated alkaline phosphatase level Alk phos remains elevated. Normal GGT. Will refer to oncology for further eval of potential underlying bone disorders.  - Ambulatory referral to [place], PA-C  [facility] at [facility] 11/11/2018 12:19 PM

## 2018-11-10 NOTE — Patient Instructions (Addendum)
Follow up in 6 months.     If you have lab work done today you will be contacted with your lab results within the next 2 weeks.  If you have not heard from [place] then please contact us. The fastest way to get your results is to register for My Chart.   IF you received an x-ray today, you will receive an invoice from [facility]. Please contact [facility] at [phone] with questions or concerns regarding your invoice.   IF you received labwork today, you will receive an invoice from [facility]. Please contact [facility] at [phone] with questions or concerns regarding your invoice.   Our billing staff will not be able to assist you with questions regarding bills from these companies.  You will be contacted with the lab results as soon as they are available. The fastest way to get your results is to activate your My Chart account. Instructions are located on the last page of this paperwork. If you have not heard from [place] regarding the results in 2 weeks, please contact this office.

## 2018-11-11 ENCOUNTER — Encounter: Payer: Self-pay | Admitting: Physician Assistant

## 2018-11-24 ENCOUNTER — Encounter: Payer: Self-pay | Admitting: Oncology

## 2018-11-24 ENCOUNTER — Telehealth: Payer: Self-pay | Admitting: Oncology

## 2018-11-24 ENCOUNTER — Telehealth: Payer: Self-pay | Admitting: Physician Assistant

## 2018-11-24 NOTE — Telephone Encounter (Signed)
New referral received from [place], [place] for elevated alkaline phosphate level. Pt has been scheduled to see Dr. Alen Blew on 12/31 at 11am. Unable to reach the pt. Will reach out to the referring office to notify the pt.

## 2018-11-24 NOTE — Telephone Encounter (Signed)
Kathy Breach, Sherron        Hi Sherron!   I was unable to reach Ms. Hantz to schedule an appt. I scheduled her to see Dr. Alen Blew on 12/31 at 11am at the [facility]. Please notify the pt of the appt date and time. She will need to arrive 30 minutes early in order to be checked in on time. I will mail her a letter w/appt information along w/directions. If she needs to reschedule this appt please have her call [phone]. Thank you and have a great day!   Seth Bake    This is the message recvd from provider .Marland Kitchen I called her phone as well and it is saying the number you have dialed has calling restrictions that has prevented the completion of call

## 2018-12-21 ENCOUNTER — Inpatient Hospital Stay: Payer: Medicare Other | Attending: Oncology | Admitting: Oncology

## 2018-12-21 VITALS — BP 177/65 | HR 69 | Temp 98.7°F | Resp 17 | Ht 63.98 in | Wt 132.8 lb

## 2018-12-21 DIAGNOSIS — M160 Bilateral primary osteoarthritis of hip: Secondary | ICD-10-CM | POA: Insufficient documentation

## 2018-12-21 DIAGNOSIS — F17210 Nicotine dependence, cigarettes, uncomplicated: Secondary | ICD-10-CM | POA: Insufficient documentation

## 2018-12-21 DIAGNOSIS — F329 Major depressive disorder, single episode, unspecified: Secondary | ICD-10-CM | POA: Insufficient documentation

## 2018-12-21 DIAGNOSIS — Z87442 Personal history of urinary calculi: Secondary | ICD-10-CM | POA: Insufficient documentation

## 2018-12-21 DIAGNOSIS — Z79899 Other long term (current) drug therapy: Secondary | ICD-10-CM | POA: Insufficient documentation

## 2018-12-21 DIAGNOSIS — Z7984 Long term (current) use of oral hypoglycemic drugs: Secondary | ICD-10-CM | POA: Insufficient documentation

## 2018-12-21 DIAGNOSIS — Z809 Family history of malignant neoplasm, unspecified: Secondary | ICD-10-CM

## 2018-12-21 DIAGNOSIS — Z8673 Personal history of transient ischemic attack (TIA), and cerebral infarction without residual deficits: Secondary | ICD-10-CM | POA: Insufficient documentation

## 2018-12-21 DIAGNOSIS — I10 Essential (primary) hypertension: Secondary | ICD-10-CM | POA: Diagnosis not present

## 2018-12-21 DIAGNOSIS — E1151 Type 2 diabetes mellitus with diabetic peripheral angiopathy without gangrene: Secondary | ICD-10-CM | POA: Insufficient documentation

## 2018-12-21 DIAGNOSIS — R748 Abnormal levels of other serum enzymes: Secondary | ICD-10-CM | POA: Insufficient documentation

## 2018-12-21 DIAGNOSIS — Z7982 Long term (current) use of aspirin: Secondary | ICD-10-CM

## 2018-12-21 DIAGNOSIS — Z85828 Personal history of other malignant neoplasm of skin: Secondary | ICD-10-CM | POA: Insufficient documentation

## 2018-12-21 DIAGNOSIS — E785 Hyperlipidemia, unspecified: Secondary | ICD-10-CM | POA: Insufficient documentation

## 2018-12-21 DIAGNOSIS — R011 Cardiac murmur, unspecified: Secondary | ICD-10-CM | POA: Insufficient documentation

## 2018-12-21 NOTE — Progress Notes (Signed)
Reason for the request:    Elevated alkaline phosphatase.  HPI: I was asked by Tenna Delaine, PA-C to evaluate Andrea [name] for elevated alkaline phosphatase.  She is a 52 year old woman with history of hypertension and diabetes as well as vascular disease who was found to have an elevated alkaline phosphatase in June 2019.  At that time her alkaline phosphatase was 191 and it was 132 in July 2019.  On July 26, 2018 was 120.  Liver function test was otherwise normal.  Creatinine and electrolytes are all within normal range.  CBC around that time showed a normal white cell count of 9.9, hemoglobin 14.6 and platelet count of 257.  Differential was all within normal range.  Prior laboratory testing in 2018 showed similar pattern with fluctuating alkaline phosphatase is high as 141 in June 2018 subsequent normalization in March 2019.  CT scan obtained in March 2019 of the abdomen and pelvis did not show any evidence of malignancy including adenopathy, bone disease or liver disease.  She has had multiple imaging studies of the abdomen as well as the chest without any evidence of bone abnormalities or malignancy.  She is completely asymptomatic from these findings.  She denies any bone pain, pathological fractures or arthralgias.  She denies any recent hospitalizations or illnesses.  Her appetite is excellent and has not lost any weight and actually have gained weight.  She is status post cholecystectomy in the past but does have nephrolithiasis.  She does not report any headaches, blurry vision, syncope or seizures. Does not report any fevers, chills or sweats.  Does not report any cough, wheezing or hemoptysis.  Does not report any chest pain, palpitation, orthopnea or leg edema.  Does not report any nausea, vomiting or abdominal pain.  Does not report any constipation or diarrhea.  Does not report any skeletal complaints.    Does not report frequency, urgency or hematuria.  Does not report any skin rashes  or lesions. Does not report any heat or cold intolerance.  Does not report any lymphadenopathy or petechiae.  Does not report any anxiety or depression.  Remaining review of systems is negative.    Past Medical History:  Diagnosis Date  . Allergy   . Anemia   . Anxiety    on Alprazolam  . Arthritis    both hips  . Cancer ([place])    skin cancer  . Carotid artery occlusion    a. s/p prior endarterectomies with known RICA occlusion - followed by [facility].  . Depression    on Effexor  . Diabetes mellitus with circulatory complication ([place])    5366  . Diastolic dysfunction without heart failure   . Heart murmur    was seen by cardiologist in [facility] 2013  . History of kidney stones   . Hyperlipidemia   . Hypertension   . Peripheral vascular disease ([place])   . PVD (peripheral vascular disease) ([place])    a. s/p prior LE bypass grafting and revision (followed by [facility]).  Marland Kitchen TIA (transient ischemic attack)    a. possible mini stroke in 2015 - was told she did not have full stroke.  :  Past Surgical History:  Procedure Laterality Date  . 2 stents    . CAROTID ENDARTERECTOMY    . CESAREAN SECTION    . CHOLECYSTECTOMY    . EXTRACORPOREAL SHOCK WAVE LITHOTRIPSY Left 03/29/2018   Procedure: LEFT EXTRACORPOREAL SHOCK WAVE LITHOTRIPSY (ESWL);  Surgeon: Franchot Gallo, MD;  Location: [facility];  Service: Urology;  Laterality: Left;  . FEMORAL BYPASS     x 2 on right side due to re-stenosis  . left/right carotid artery     2003 and 2009 for right, right side is 100% occluded per patient; left side 2009  . TUBAL LIGATION    :   Current Outpatient Medications:  .  amLODipine (NORVASC) 10 MG tablet, Take 1 tablet (10 mg total) by mouth daily., Disp: 90 tablet, Rfl: 3 .  aspirin 81 MG tablet, Take 1 tablet (81 mg total) by mouth daily., Disp: 30 tablet, Rfl: 11 .  cetirizine (ZYRTEC) 10 MG tablet, Take 1 tablet (10 mg total) by mouth daily., Disp: 90 tablet, Rfl: 3 .  clopidogrel (PLAVIX) 75 MG  tablet, Take 1 tablet (75 mg total) by mouth daily., Disp: 90 tablet, Rfl: 3 .  fluticasone (FLONASE) 50 MCG/ACT nasal spray, Place 2 sprays into both nostrils daily., Disp: 16 g, Rfl: 3 .  Lancets MISC, Test blood sugar once daily., Disp: 100 each, Rfl: 2 .  lisinopril (PRINIVIL,ZESTRIL) 2.5 MG tablet, Take 1 tablet (2.5 mg total) by mouth daily., Disp: 90 tablet, Rfl: 3 .  metFORMIN (GLUCOPHAGE) 1000 MG tablet, Take 1 tablet (1,000 mg total) by mouth 2 (two) times daily with a meal., Disp: 180 tablet, Rfl: 3 .  simvastatin (ZOCOR) 20 MG tablet, Take 1 tablet (20 mg total) by mouth at bedtime., Disp: 90 tablet, Rfl: 3 .  venlafaxine XR (EFFEXOR-XR) 150 MG 24 hr capsule, TAKE 1 CAPSULE BY MOUTH DAILY WITH BREAKFAST, Disp: 90 capsule, Rfl: 3:  Allergies  Allergen Reactions  . Atorvastatin     Muscle cramps  . Other Hives    Contrast dye   . Rosuvastatin Nausea And Vomiting    Nausea/vomiting  :  Family History  Problem Relation Age of Onset  . Diabetes Mother   . Heart disease Mother   . Hyperlipidemia Mother   . Hypertension Mother   . Heart disease Father        MI, unknown age  . Hyperlipidemia Father   . Hypertension Father   . Diabetes Maternal Grandmother   . Heart disease Maternal Grandmother   . Hyperlipidemia Maternal Grandmother   . Cancer Paternal Grandmother   . Cancer Sister   . Varicose Veins Sister   . Heart attack Brother        Age 19  :  Social History   Socioeconomic History  . Marital status: Married    Spouse name: Not on file  . Number of children: 1  . Years of education: College  . Highest education level: Not on file  Occupational History  . Occupation: retired/disabled    Fish farm manager: UNEMPLOYED  Social Needs  . Financial resource strain: Not on file  . Food insecurity:    Worry: Not on file    Inability: Not on file  . Transportation needs:    Medical: Not on file    Non-medical: Not on file  Tobacco Use  . Smoking status: Current  Every Day Smoker    Packs/day: 1.00    Years: 40.00    Pack years: 40.00    Types: Cigarettes  . Smokeless tobacco: Never Used  Substance and Sexual Activity  . Alcohol use: Yes    Alcohol/week: 0.0 standard drinks    Comment: holidays/special occas 3-4 drinks  . Drug use: No  . Sexual activity: Not Currently    Partners: Male  Lifestyle  . Physical activity:  Days per week: 0 days    Minutes per session: 0 min  . Stress: To some extent  Relationships  . Social connections:    Talks on phone: More than three times a week    Gets together: Twice a week    Attends religious service: 1 to 4 times per year    Active member of club or organization: Yes    Attends meetings of clubs or organizations: 1 to 4 times per year    Relationship status: Married  . Intimate partner violence:    Fear of current or ex partner: No    Emotionally abused: No    Physically abused: No    Forced sexual activity: No  Other Topics Concern  . Not on file  Social History Narrative   Pt is from [facility]   Patient lives at home with her husband and son   Patient is right handed   Patient drinks coffee daily  :  Pertinent items are noted in HPI.  Exam:   General appearance: alert and cooperative appeared without distress. Head: atraumatic without any abnormalities. Eyes: conjunctivae/corneas clear. PERRL.  Sclera anicteric. Throat: lips, mucosa, and tongue normal; without oral thrush or ulcers. Resp: clear to auscultation bilaterally without rhonchi, wheezes or dullness to percussion. Cardio: regular rate and rhythm, S1, S2 normal, no murmur, click, rub or gallop GI: soft, non-tender; bowel sounds normal; no masses,  no organomegaly Skin: Skin color, texture, turgor normal. No rashes or lesions Lymph nodes: Cervical, supraclavicular, and axillary nodes normal. Neurologic: Grossly normal without any motor, sensory or deep tendon reflexes. Musculoskeletal: No joint deformity or  effusion.  CBC    Component Value Date/Time   WBC 9.9 06/09/2018 1516   WBC 12.2 (H) 03/11/2018 2309   RBC 4.83 06/09/2018 1516   RBC 4.56 03/11/2018 2309   HGB 14.6 06/09/2018 1516   HCT 42.9 06/09/2018 1516   PLT 257 06/09/2018 1516   MCV 89 06/09/2018 1516   MCH 30.2 06/09/2018 1516   MCH 30.0 03/11/2018 2309   MCHC 34.0 06/09/2018 1516   MCHC 32.6 03/11/2018 2309   RDW 14.3 06/09/2018 1516   LYMPHSABS 4.0 (H) 06/09/2018 1516   MONOABS 1.2 (H) 03/11/2018 2309   EOSABS 0.3 06/09/2018 1516   BASOSABS 0.1 06/09/2018 1516     Assessment and Plan:   52 year old woman with the following:   1.  Elevated alkaline phosphatase that has fluctuated since 2018.  In August 2019 her alkaline phosphatase was 120 which has declined from 191.  The differential diagnosis was reviewed today in detail.  Metastatic malignancy to the bone is considered extremely unlikely at this time.  I see no evidence to suggest that based on multiple imaging studies and laboratory data.  Multiple myeloma is also considered less likely given her normal CBC, electrolytes, kidney function and calcium levels.  I do not recommend any further evaluation from hematology standpoint.  Other causes of elevated alkaline phosphatase could be from a GI source which worth investigating in the future.  2.  Age-appropriate cancer screening: She is up-to-date at this time with her colonoscopy.  She is overdue for her mammography which I recommend that she completes.  3.  Follow-up: I am happy to see her in the future as needed.  40  minutes was spent with the patient face-to-face today.  More than 50% of time was dedicated to reviewing laboratory data, differential diagnosis, imaging studies review and answering question regarding plan of care.  Thank you for the referral. A copy of this consult has been forwarded to the requesting physician.

## 2018-12-23 ENCOUNTER — Telehealth: Payer: Self-pay | Admitting: Oncology

## 2018-12-23 NOTE — Telephone Encounter (Signed)
No los per 12/31.

## 2019-01-05 ENCOUNTER — Ambulatory Visit: Payer: Medicare Other | Admitting: Physician Assistant

## 2019-01-07 ENCOUNTER — Ambulatory Visit: Payer: Medicare Other | Admitting: Family

## 2019-01-07 ENCOUNTER — Ambulatory Visit (INDEPENDENT_AMBULATORY_CARE_PROVIDER_SITE_OTHER)
Admission: RE | Admit: 2019-01-07 | Discharge: 2019-01-07 | Disposition: A | Discharge status: Home / Self Care | Payer: Medicare Other | Source: Ambulatory Visit | Attending: Family | Admitting: Family

## 2019-01-07 ENCOUNTER — Ambulatory Visit (HOSPITAL_COMMUNITY)
Admission: RE | Admit: 2019-01-07 | Discharge: 2019-01-07 | Disposition: A | Discharge status: Home / Self Care | Payer: Medicare Other | Source: Ambulatory Visit | Attending: Family | Admitting: Family

## 2019-01-07 ENCOUNTER — Other Ambulatory Visit: Payer: Self-pay

## 2019-01-07 ENCOUNTER — Encounter: Payer: Self-pay | Admitting: Family

## 2019-01-07 VITALS — BP 133/74 | HR 77 | Resp 16 | Ht 63.0 in | Wt 132.0 lb

## 2019-01-07 DIAGNOSIS — I779 Disorder of arteries and arterioles, unspecified: Secondary | ICD-10-CM | POA: Insufficient documentation

## 2019-01-07 DIAGNOSIS — I6522 Occlusion and stenosis of left carotid artery: Secondary | ICD-10-CM | POA: Diagnosis not present

## 2019-01-07 DIAGNOSIS — I6521 Occlusion and stenosis of right carotid artery: Secondary | ICD-10-CM | POA: Diagnosis not present

## 2019-01-07 DIAGNOSIS — Z95828 Presence of other vascular implants and grafts: Secondary | ICD-10-CM | POA: Diagnosis present

## 2019-01-07 DIAGNOSIS — Z9889 Other specified postprocedural states: Secondary | ICD-10-CM

## 2019-01-07 DIAGNOSIS — F17200 Nicotine dependence, unspecified, uncomplicated: Secondary | ICD-10-CM

## 2019-01-07 NOTE — Progress Notes (Signed)
Chief Complaint: Follow up Extracranial Carotid Artery Stenosis and PAD   History of Present Illness  Andrea [name] is a 53 y.o. female whom Dr. Scot Dock had been monitoring since the departure of Dr. Amedeo Plenty. She had previous bilateral carotid endarterectomies by Dr. Drucie Opitz and also a femorofemoral bypass graft by Dr. Amedeo Plenty.   She had described some left-sided weakness in July 2015 which was felt to be related to hypotension. She has a known right internal carotid artery occlusion.   She denies any subsequent stroke or TIA symptoms.   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  She was walking 1-2 miles daily, is no longer, but not due to any claudication sx, states she is busy.  She denies claudication type symptoms, denies rest pain, denies non healing wounds in her lower extremities.   She denies any history of MI or cardiac stents, states she does have heart murmur.   At today's visit she denies chest pain or dyspnea, denies abdominal pain.    Diabetic: no, 6.5 A1C on 11-10-18 Tobacco use: smoker  (1 ppd, started at age 43 yrs), quit for 5 years 2010-2015, then resumed  Pt meds include: Statin : yes ASA: yes Other anticoagulants/antiplatelets: Plavix   Past Medical History:  Diagnosis Date  . Allergy   . Anemia   . Anxiety    on Alprazolam  . Arthritis    both hips  . Cancer ([place])    skin cancer  . Carotid artery occlusion    a. s/p prior endarterectomies with known RICA occlusion - followed by [facility].  . Depression    on Effexor  . Diabetes mellitus with circulatory complication ([place])    5573  . Diastolic dysfunction without heart failure   . Heart murmur    was seen by cardiologist in [facility] 2013  . History of kidney stones   . Hyperlipidemia   . Hypertension   . Peripheral vascular disease ([place])   . PVD (peripheral vascular disease) ([place])    a. s/p prior LE bypass grafting and revision (followed by [facility]).  Marland Kitchen  TIA (transient ischemic attack)    a. possible mini stroke in 2015 - was told she did not have full stroke.    Social History Social History   Tobacco Use  . Smoking status: Current Every Day Smoker    Packs/day: 1.00    Years: 40.00    Pack years: 40.00    Types: Cigarettes  . Smokeless tobacco: Never Used  Substance Use Topics  . Alcohol use: Yes    Alcohol/week: 0.0 standard drinks    Comment: holidays/special occas 3-4 drinks  . Drug use: No    Family History Family History  Problem Relation Age of Onset  . Diabetes Mother   . Heart disease Mother   . Hyperlipidemia Mother   . Hypertension Mother   . Heart disease Father        MI, unknown age  . Hyperlipidemia Father   . Hypertension Father   . Diabetes Maternal Grandmother   . Heart disease Maternal Grandmother   . Hyperlipidemia Maternal Grandmother   . Cancer Paternal Grandmother   . Cancer Sister   . Varicose Veins Sister   . Heart attack Brother        Age 72    Surgical History Past Surgical History:  Procedure Laterality Date  . 2 stents    . CAROTID ENDARTERECTOMY    .  CESAREAN SECTION    . CHOLECYSTECTOMY    . EXTRACORPOREAL SHOCK WAVE LITHOTRIPSY Left 03/29/2018   Procedure: LEFT EXTRACORPOREAL SHOCK WAVE LITHOTRIPSY (ESWL);  Surgeon: Franchot Gallo, MD;  Location: [facility];  Service: Urology;  Laterality: Left;  . FEMORAL BYPASS     x 2 on right side due to re-stenosis  . left/right carotid artery     2003 and 2009 for right, right side is 100% occluded per patient; left side 2009  . TUBAL LIGATION      Allergies  Allergen Reactions  . Atorvastatin     Muscle cramps  . Other Hives    Contrast dye   . Rosuvastatin Nausea And Vomiting    Nausea/vomiting    Current Outpatient Medications  Medication Sig Dispense Refill  . amLODipine (NORVASC) 10 MG tablet Take 1 tablet (10 mg total) by mouth daily. 90 tablet 3  . aspirin 81 MG tablet Take 1 tablet (81 mg total) by mouth daily. 30  tablet 11  . cetirizine (ZYRTEC) 10 MG tablet Take 1 tablet (10 mg total) by mouth daily. 90 tablet 3  . clopidogrel (PLAVIX) 75 MG tablet Take 1 tablet (75 mg total) by mouth daily. 90 tablet 3  . fluticasone (FLONASE) 50 MCG/ACT nasal spray Place 2 sprays into both nostrils daily. 16 g 3  . Lancets MISC Test blood sugar once daily. 100 each 2  . lisinopril (PRINIVIL,ZESTRIL) 2.5 MG tablet Take 1 tablet (2.5 mg total) by mouth daily. 90 tablet 3  . metFORMIN (GLUCOPHAGE) 1000 MG tablet Take 1 tablet (1,000 mg total) by mouth 2 (two) times daily with a meal. 180 tablet 3  . simvastatin (ZOCOR) 20 MG tablet Take 1 tablet (20 mg total) by mouth at bedtime. 90 tablet 3  . venlafaxine XR (EFFEXOR-XR) 150 MG 24 hr capsule TAKE 1 CAPSULE BY MOUTH DAILY WITH BREAKFAST 90 capsule 3   No current facility-administered medications for this visit.     Review of Systems : See HPI for pertinent positives and negatives.  Physical Examination  Vitals:   01/07/19 1530 01/07/19 1531  BP: 121/69 133/74  Pulse: 77   Resp: 16   SpO2: 98%   Weight: 132 lb (59.9 kg)   Height: 5\' 3"  (1.6 m)    Body mass index is 23.38 kg/m.  General: WDWN female in NAD GAIT: normal Eyes: PERRLA HENT: No gross abnormalities.  Pulmonary:  Respirations are non-labored, fair air movement in all fields, no rales, rhonchi, or wheezes. Cardiac: regular rhythm, no detected murmur.  VASCULAR EXAM Carotid Bruits Right Left   Positive Positive     Abdominal aortic pulse is moderately palpable. Radial pulses are 1+ palpable and equal.                                                                                                                            LE Pulses Right Left       FEMORAL  2+ palpable  2+ palpable        POPLITEAL  not palpable   not palpable       POSTERIOR TIBIAL  not palpable   1+ palpable        DORSALIS PEDIS      ANTERIOR TIBIAL not palpable  1+ palpable     Gastrointestinal: soft,  nontender, BS WNL, no r/g, no palpable masses. Musculoskeletal: no muscle atrophy/wasting. M/S 5/5 throughout, extremities without ischemic changes. Skin: No rashes, no ulcers, no cellulitis.  Excessive facial wrinkling Neurologic:  A&O X 3; appropriate affect, sensation is normal; speech is normal, CN 2-12 intact, pain and light touch intact in extremities, motor exam as listed above. Psychiatric: Normal thought content, mood appropriate to clinical situation.     Assessment: Andrea [name] is a 53 y.o. female who has undergone previous bilateral carotid endarterectomies by Dr. Drucie Opitz and also a femorofemoral bypass graft by Dr. Amedeo Plenty. She had described some left-sided weakness in July 2015 which was felt to be related to hypotension. She has a known right internal carotid artery occlusion. She has moderate disease on the left ICA.  She denies any subsequent history of stroke, TIAs, expressive or receptive aphasia, or amaurosis fugax. She denies claudication sx's with walking; she has been walking less, states she is busy with everyday activities, no longer walks for exercise, but has no claudication with any amount of walking, no wounds.   Bilateral femoral pulses are palpable; fem-fem bypass graft pulse is palpable. Decline in bilateral ABI with no claudication symptoms; 348 cm/s velocity at proximal anastomosis.   Fortunately her DM remains in good control, but unfortunately she resumed smoking, expressed a desire to quit. Over 3 minutes was spent counseling patient re smoking cessation, and patient was given several free resources re smoking cessation.  Moderately palpable abdominal aortic pulse, no abdominal pain, no pt or family hx of AAA; non contrast CT abd in March 2019 to evaluate abdominal pain and hematuria did no mention aneurysm, see vascular results below. Consider AAA duplex at some point.    DATA  Carotid Duplex (01-07-19): Right ICA: CEA site remains  occluded. Left ICA: CEA site with 60-79% stenosis Bilateral vertebral artery flow is antegrade.  Bilateral subclavian artery waveforms are normal.  Increased stenosis in the left ICA compared to the exam on 08-03-17.    Right to left fem-fem Bypass graft Duplex (01-07-19): Highest velocity is 348 cm/s at the proximal anastomosis 307 cm/s at the outflow. Biphasic waveforms at the inflow, monophasic waveforms at the proximal anastomosis, monophasic tortuous and monophasic waveforms distally   ABI (Date: 01/07/2019):  R:   ABI: 0.64 (was 0.82 on 08-03-17),   PT: mono (was tri)  DP: mono (was tri)  TBI:  0.25, toe pressure 36 (was 0.82)  L:   ABI: 0.75 (was 0.85),   PT: bi (was bi or tri)  DP: bi (was tri)  TBI: 0.62, toe pressure 88 (was 0.93) Decline in bilateral ABI, significant decline in bilateral TBI. Decline in bilateral waveform morphology.  Moderate disease bilaterally with monophasic waveforms in the right, biphasic in the left.    03-11-18 CT abd/pelvis w/o contrast to evaluate abdominal pain and hematuria:  Vascular/Lymphatic: Severe Aortoiliac calcified atherosclerosis. There is a femoral-femoral bypass graft in the ventral lower abdominal wall. Vascular stent within the right common and external iliac arteries. Vascular patency is not evaluated in the absence of IV contrast.    Plan: Follow-up with Dr. Carlis Abbott at his first available  clinic appointment to discuss with Dr. Carlis Abbott pt options to address asymptomatic stenosis of the fem-fem bypass graft.  Pt requested to see Dr. Carlis Abbott.   Pt will remain on dual antiplatelet therapy until she sees Dr. Carlis Abbott.   Carotid duplex follow up is due in 6 months.      I discussed in depth with the patient the nature of atherosclerosis, and emphasized the importance of maximal medical management including strict control of blood pressure, blood glucose, and lipid levels, obtaining regular exercise, and cessation of  smoking.  The patient is aware that without maximal medical management the underlying atherosclerotic disease process will progress, limiting the benefit of any interventions. The patient was given information about stroke prevention and what symptoms should prompt the patient to seek immediate medical care. Thank you for allowing [place] to participate in this patient's care.  Clemon Chambers, RN, MSN, FNP-C [facility] Office: [phone]  Clinic Physician: Trula Slade on call  01/07/19 4:25 PM

## 2019-01-07 NOTE — Patient Instructions (Signed)
Steps to Quit Smoking  Smoking tobacco can be bad for your health. It can also affect almost every organ in your body. Smoking puts you and people around you at risk for many serious long-lasting (chronic) diseases. Quitting smoking is hard, but it is one of the best things that you can do for your health. It is never too late to quit. What are the benefits of quitting smoking? When you quit smoking, you lower your risk for getting serious diseases and conditions. They can include:  Lung cancer or lung disease.  Heart disease.  Stroke.  Heart attack.  Not being able to have children (infertility).  Weak bones (osteoporosis) and broken bones (fractures). If you have coughing, wheezing, and shortness of breath, those symptoms may get better when you quit. You may also get sick less often. If you are pregnant, quitting smoking can help to lower your chances of having a baby of low birth weight. What can I do to help me quit smoking? Talk with your doctor about what can help you quit smoking. Some things you can do (strategies) include:  Quitting smoking totally, instead of slowly cutting back how much you smoke over a period of time.  Going to in-person counseling. You are more likely to quit if you go to many counseling sessions.  Using resources and support systems, such as: ? Online chats with a counselor. ? Phone quitlines. ? Printed self-help materials. ? Support groups or group counseling. ? Text messaging programs. ? Mobile phone apps or applications.  Taking medicines. Some of these medicines may have nicotine in them. If you are pregnant or breastfeeding, do not take any medicines to quit smoking unless your doctor says it is okay. Talk with your doctor about counseling or other things that can help you. Talk with your doctor about using more than one strategy at the same time, such as taking medicines while you are also going to in-person counseling. This can help make  quitting easier. What things can I do to make it easier to quit? Quitting smoking might feel very hard at first, but there is a lot that you can do to make it easier. Take these steps:  Talk to your family and friends. Ask them to support and encourage you.  Call phone quitlines, reach out to support groups, or work with a counselor.  Ask people who smoke to not smoke around you.  Avoid places that make you want (trigger) to smoke, such as: ? Bars. ? Parties. ? Smoke-break areas at work.  Spend time with people who do not smoke.  Lower the stress in your life. Stress can make you want to smoke. Try these things to help your stress: ? Getting regular exercise. ? Deep-breathing exercises. ? Yoga. ? Meditating. ? Doing a body scan. To do this, close your eyes, focus on one area of your body at a time from head to toe, and notice which parts of your body are tense. Try to relax the muscles in those areas.  Download or buy apps on your mobile phone or tablet that can help you stick to your quit plan. There are many free apps, such as QuitGuide from the CDC (Centers for Disease Control and Prevention). You can find more support from smokefree.gov and other websites. This information is not intended to replace advice given to you by your health care provider. Make sure you discuss any questions you have with your health care provider. Document Released: 10/04/2009 Document Revised: 08/05/2016   Document Reviewed: 04/24/2015 °Elsevier Interactive Patient Education © 2019 Elsevier Inc. ° ° ° ° °Stroke Prevention °Some medical conditions and lifestyle choices can lead to a higher risk for a stroke. You can help to prevent a stroke by making nutrition, lifestyle, and other changes. °What nutrition changes can be made? ° °· Eat healthy foods. °? Choose foods that are high in fiber. These include: °§ Fresh fruits. °§ Fresh vegetables. °§ Whole grains. °? Eat at least 5 or more servings of fruits and  vegetables each day. Try to fill half of your plate at each meal with fruits and vegetables. °? Choose lean protein foods. These include: °§ Lowfat (lean) cuts of meat. °§ Chicken without skin. °§ Fish. °§ Tofu. °§ Beans. °§ Nuts. °? Eat low-fat dairy products. °? Avoid foods that: °§ Are high in salt (sodium). °§ Have saturated fat. °§ Have trans fat. °§ Have cholesterol. °§ Are processed. °§ Are premade. °· Follow eating guidelines as told by your doctor. These may include: °? Reducing how many calories you eat and drink each day. °? Limiting how much salt you eat or drink each day to 1,500 milligrams (mg). °? Using only healthy fats for cooking. These include: °§ Olive oil. °§ Canola oil. °§ Sunflower oil. °? Counting how many carbohydrates you eat and drink each day. °What lifestyle changes can be made? °· Try to stay at a healthy weight. Talk to your doctor about what a good weight is for you. °· Get at least 30 minutes of moderate physical activity at least 5 days a week. This can include: °? Fast walking. °? Biking. °? Swimming. °· Do not use any products that have nicotine or tobacco. This includes cigarettes and e-cigarettes. If you need help quitting, ask your doctor. Avoid being around tobacco smoke in general. °· Limit how much alcohol you drink to no more than 1 drink a day for nonpregnant women and 2 drinks a day for men. One drink equals 12 oz of beer, 5 oz of wine, or 1½ oz of hard liquor. °· Do not use drugs. °· Avoid taking birth control pills. Talk to your doctor about the risks of taking birth control pills if: °? You are over 35 years old. °? You smoke. °? You get migraines. °? You have had a blood clot. °What other changes can be made? °· Manage your cholesterol. °? It is important to eat a healthy diet. °? If your cholesterol cannot be managed through your diet, you may also need to take medicines. Take medicines as told by your doctor. °· Manage your diabetes. °? It is important to eat a  healthy diet and to exercise regularly. °? If your blood sugar cannot be managed through diet and exercise, you may need to take medicines. Take medicines as told by your doctor. °· Control your high blood pressure (hypertension). °? Try to keep your blood pressure below 130/80. This can help lower your risk of stroke. °? It is important to eat a healthy diet and to exercise regularly. °? If your blood pressure cannot be managed through diet and exercise, you may need to take medicines. Take medicines as told by your doctor. °? Ask your doctor if you should check your blood pressure at home. °? Have your blood pressure checked every year. Do this even if your blood pressure is normal. °· Talk to your doctor about getting checked for a sleep disorder. Signs of this can include: °? Snoring a lot. °? Feeling   very tired. °· Take over-the-counter and prescription medicines only as told by your doctor. These may include aspirin or blood thinners (antiplatelets or anticoagulants). °· Make sure that any other medical conditions you have are managed. °Where to find more information °· American Stroke Association: www.strokeassociation.org °· National Stroke Association: www.stroke.org °Get help right away if: °· You have any symptoms of stroke. "BE FAST" is an easy way to remember the main warning signs: °? B - Balance. Signs are dizziness, sudden trouble walking, or loss of balance. °? E - Eyes. Signs are trouble seeing or a sudden change in how you see. °? F - Face. Signs are sudden weakness or loss of feeling of the face, or the face or eyelid drooping on one side. °? A - Arms. Signs are weakness or loss of feeling in an arm. This happens suddenly and usually on one side of the body. °? S - Speech. Signs are sudden trouble speaking, slurred speech, or trouble understanding what people say. °? T - Time. Time to call emergency services. Write down what time symptoms started. °· You have other signs of stroke, such as: °? A  sudden, very bad headache with no known cause. °? Feeling sick to your stomach (nausea). °? Throwing up (vomiting). °? Jerky movements you cannot control (seizure). °These symptoms may represent a serious problem that is an emergency. Do not wait to see if the symptoms will go away. Get medical help right away. Call your local emergency services (911 in the [place] Do not drive yourself to the hospital. °Summary °· You can prevent a stroke by eating healthy, exercising, not smoking, drinking less alcohol, and treating other health problems, such as diabetes, high blood pressure, or high cholesterol. °· Do not use any products that contain nicotine or tobacco, such as cigarettes and e-cigarettes. °· Get help right away if you have any signs or symptoms of a stroke. °This information is not intended to replace advice given to you by your health care provider. Make sure you discuss any questions you have with your health care provider. °Document Released: 06/08/2012 Document Revised: 03/11/2017 Document Reviewed: 03/11/2017 °Elsevier Interactive Patient Education © 2019 Elsevier Inc. ° ° ° ° °Peripheral Vascular Disease ° °Peripheral vascular disease (PVD) is a disease of the blood vessels that are not part of your heart and brain. A simple term for PVD is poor circulation. In most cases, PVD narrows the blood vessels that carry blood from your heart to the rest of your body. This can reduce the supply of blood to your arms, legs, and internal organs, like your stomach or kidneys. However, PVD most often affects a person’s lower legs and feet. Without treatment, PVD tends to get worse. °PVD can also lead to acute ischemic limb. This is when an arm or leg suddenly cannot get enough blood. This is a medical emergency. °Follow these instructions at home: °Lifestyle °· Do not use any products that contain nicotine or tobacco, such as cigarettes and e-cigarettes. If you need help quitting, ask your doctor. °· Lose weight if  you are overweight. Or, stay at a healthy weight as told by your doctor. °· Eat a diet that is low in fat and cholesterol. If you need help, ask your doctor. °· Exercise regularly. Ask your doctor for activities that are right for you. °General instructions °· Take over-the-counter and prescription medicines only as told by your doctor. °· Take good care of your feet: °? Wear comfortable shoes that fit well. °?   Check your feet often for any cuts or sores. °· Keep all follow-up visits as told by your doctor This is important. °Contact a doctor if: °· You have cramps in your legs when you walk. °· You have leg pain when you are at rest. °· You have coldness in a leg or foot. °· Your skin changes. °· You are unable to get or have an erection (erectile dysfunction). °· You have cuts or sores on your feet that do not heal. °Get help right away if: °· Your arm or leg turns cold, numb, and blue. °· Your arms or legs become red, warm, swollen, painful, or numb. °· You have chest pain. °· You have trouble breathing. °· You suddenly have weakness in your face, arm, or leg. °· You become very confused or you cannot speak. °· You suddenly have a very bad headache. °· You suddenly cannot see. °Summary °· Peripheral vascular disease (PVD) is a disease of the blood vessels. °· A simple term for PVD is poor circulation. Without treatment, PVD tends to get worse. °· Treatment may include exercise, low fat and low cholesterol diet, and quitting smoking. °This information is not intended to replace advice given to you by your health care provider. Make sure you discuss any questions you have with your health care provider. °Document Released: 03/04/2010 Document Revised: 01/15/2017 Document Reviewed: 01/15/2017 °Elsevier Interactive Patient Education © 2019 Elsevier Inc. ° °

## 2019-01-10 ENCOUNTER — Ambulatory Visit: Payer: Medicare Other | Admitting: Physician Assistant

## 2019-01-11 ENCOUNTER — Ambulatory Visit (INDEPENDENT_AMBULATORY_CARE_PROVIDER_SITE_OTHER): Payer: Medicare Other | Admitting: Vascular Surgery

## 2019-01-11 ENCOUNTER — Other Ambulatory Visit: Payer: Self-pay

## 2019-01-11 ENCOUNTER — Encounter: Payer: Self-pay | Admitting: Vascular Surgery

## 2019-01-11 ENCOUNTER — Other Ambulatory Visit: Payer: Self-pay | Admitting: *Deleted

## 2019-01-11 ENCOUNTER — Encounter: Payer: Self-pay | Admitting: *Deleted

## 2019-01-11 VITALS — BP 153/76 | HR 74 | Temp 97.4°F | Resp 20 | Ht 63.0 in | Wt 132.0 lb

## 2019-01-11 DIAGNOSIS — I739 Peripheral vascular disease, unspecified: Secondary | ICD-10-CM | POA: Diagnosis not present

## 2019-01-11 DIAGNOSIS — I6523 Occlusion and stenosis of bilateral carotid arteries: Secondary | ICD-10-CM | POA: Diagnosis not present

## 2019-01-11 NOTE — Progress Notes (Signed)
Patient name: Andrea [name] MRN: [id number] DOB: [birth year] Sex: female  REASON FOR CONSULT: Threatened right to left femorofemoral graft  HPI: Andrea [name] is a 52 y.o. female, with known severe peripheral vascular disease that presents for evaluation of threatened right to left femoral-femoral bypass.  Patient states she initially underwent right to left femorofemoral bypass by Dr. Amedeo Plenty approximately 17 years ago (in 2003) - using 7 mm Dacron.  She states that he had to do a revision later.  It appears she had bilateral femoral patch angioplasty on 01/25/04.  She does note that she had a stenting procedure done at [facility] several years ago but unclear what this involved.  She has also undergone a right common iliac artery stent here by Dr. Amedeo Plenty.  She has been followed by [facility] for surveillance of her bilateral carotid disease (patient's had bilateral carotid endarterectomies and has a known right ICA occlusion).  She has also been undergoing surveillance of her femorofemoral bypass.  She reports no lower extremity claudication rest pain or new tissue loss.  On her duplex from 01/07/2019 she had a proximal anastomosis velocity of 348 concerning for greater than 70% stenosis with monophasic waveforms and then the distal anastomosis had a very low velocity of 36 also with a monophasic waveform.  ABI 0.64 on the right and 0.75 on the left.  Looking back in care everywhere her last duplex was in 2016 with a velocity of over 400 at the right femoral anastomosis but the remainder of the velocity in the graft were over 70 and ABI 1.0 at that time.  She is back to smoking.  Past Medical History:  Diagnosis Date  . Allergy   . Anemia   . Anxiety    on Alprazolam  . Arthritis    both hips  . Cancer ([place])    skin cancer  . Carotid artery occlusion    a. s/p prior endarterectomies with known RICA occlusion - followed by [facility].  . Depression    on Effexor  . Diabetes mellitus with circulatory  complication ([place])    5400  . Diastolic dysfunction without heart failure   . Heart murmur    was seen by cardiologist in [facility] 2013  . History of kidney stones   . Hyperlipidemia   . Hypertension   . Peripheral vascular disease ([place])   . PVD (peripheral vascular disease) ([place])    a. s/p prior LE bypass grafting and revision (followed by [facility]).  Marland Kitchen TIA (transient ischemic attack)    a. possible mini stroke in 2015 - was told she did not have full stroke.    Past Surgical History:  Procedure Laterality Date  . 2 stents    . CAROTID ENDARTERECTOMY    . CESAREAN SECTION    . CHOLECYSTECTOMY    . EXTRACORPOREAL SHOCK WAVE LITHOTRIPSY Left 03/29/2018   Procedure: LEFT EXTRACORPOREAL SHOCK WAVE LITHOTRIPSY (ESWL);  Surgeon: Franchot Gallo, MD;  Location: [facility];  Service: Urology;  Laterality: Left;  . FEMORAL BYPASS     x 2 on right side due to re-stenosis  . left/right carotid artery     2003 and 2009 for right, right side is 100% occluded per patient; left side 2009  . TUBAL LIGATION      Family History  Problem Relation Age of Onset  . Diabetes Mother   . Heart disease Mother   . Hyperlipidemia Mother   . Hypertension Mother   . Heart disease Father  MI, unknown age  . Hyperlipidemia Father   . Hypertension Father   . Diabetes Maternal Grandmother   . Heart disease Maternal Grandmother   . Hyperlipidemia Maternal Grandmother   . Cancer Paternal Grandmother   . Cancer Sister   . Varicose Veins Sister   . Heart attack Brother        Age 22    SOCIAL HISTORY: Social History   Socioeconomic History  . Marital status: Married    Spouse name: Not on file  . Number of children: 1  . Years of education: College  . Highest education level: Not on file  Occupational History  . Occupation: retired/disabled    Fish farm manager: UNEMPLOYED  Social Needs  . Financial resource strain: Not on file  . Food insecurity:    Worry: Not on file    Inability: Not on file   . Transportation needs:    Medical: Not on file    Non-medical: Not on file  Tobacco Use  . Smoking status: Current Every Day Smoker    Packs/day: 1.00    Years: 40.00    Pack years: 40.00    Types: Cigarettes  . Smokeless tobacco: Never Used  Substance and Sexual Activity  . Alcohol use: Yes    Alcohol/week: 0.0 standard drinks    Comment: holidays/special occas 3-4 drinks  . Drug use: No  . Sexual activity: Not Currently    Partners: Male  Lifestyle  . Physical activity:    Days per week: 0 days    Minutes per session: 0 min  . Stress: To some extent  Relationships  . Social connections:    Talks on phone: More than three times a week    Gets together: Twice a week    Attends religious service: 1 to 4 times per year    Active member of club or organization: Yes    Attends meetings of clubs or organizations: 1 to 4 times per year    Relationship status: Married  . Intimate partner violence:    Fear of current or ex partner: No    Emotionally abused: No    Physically abused: No    Forced sexual activity: No  Other Topics Concern  . Not on file  Social History Narrative   Pt is from [facility]   Patient lives at home with her husband and son   Patient is right handed   Patient drinks coffee daily    Allergies  Allergen Reactions  . Atorvastatin     Muscle cramps  . Other Hives    Contrast dye   . Rosuvastatin Nausea And Vomiting    Nausea/vomiting    Current Outpatient Medications  Medication Sig Dispense Refill  . amLODipine (NORVASC) 10 MG tablet Take 1 tablet (10 mg total) by mouth daily. 90 tablet 3  . aspirin 81 MG tablet Take 1 tablet (81 mg total) by mouth daily. 30 tablet 11  . cetirizine (ZYRTEC) 10 MG tablet Take 1 tablet (10 mg total) by mouth daily. 90 tablet 3  . clopidogrel (PLAVIX) 75 MG tablet Take 1 tablet (75 mg total) by mouth daily. 90 tablet 3  . fluticasone (FLONASE) 50 MCG/ACT nasal spray Place 2 sprays into both nostrils  daily. 16 g 3  . Lancets MISC Test blood sugar once daily. 100 each 2  . lisinopril (PRINIVIL,ZESTRIL) 2.5 MG tablet Take 1 tablet (2.5 mg total) by mouth daily. 90 tablet 3  . metFORMIN (GLUCOPHAGE) 1000 MG tablet Take  1 tablet (1,000 mg total) by mouth 2 (two) times daily with a meal. 180 tablet 3  . simvastatin (ZOCOR) 20 MG tablet Take 1 tablet (20 mg total) by mouth at bedtime. 90 tablet 3  . venlafaxine XR (EFFEXOR-XR) 150 MG 24 hr capsule TAKE 1 CAPSULE BY MOUTH DAILY WITH BREAKFAST 90 capsule 3   No current facility-administered medications for this visit.     REVIEW OF SYSTEMS:  [X]  denotes positive finding, [ ]  denotes negative finding Cardiac  Comments:  Chest pain or chest pressure:    Shortness of breath upon exertion:    Short of breath when lying flat:    Irregular heart rhythm:        Vascular    Pain in calf, thigh, or hip brought on by ambulation:    Pain in feet at night that wakes you up from your sleep:     Blood clot in your veins:    Leg swelling:         Pulmonary    Oxygen at home:    Productive cough:     Wheezing:         Neurologic    Sudden weakness in arms or legs:     Sudden numbness in arms or legs:     Sudden onset of difficulty speaking or slurred speech:    Temporary loss of vision in one eye:     Problems with dizziness:         Gastrointestinal    Blood in stool:     Vomited blood:         Genitourinary    Burning when urinating:     Blood in urine:        Psychiatric    Major depression:         Hematologic    Bleeding problems:    Problems with blood clotting too easily:        Skin    Rashes or ulcers:        Constitutional    Fever or chills:      PHYSICAL EXAM: Vitals:   01/11/19 1003  BP: (!) 153/76  Pulse: 74  Resp: 20  Temp: (!) 97.4 F (36.3 C)  SpO2: 97%  Weight: 132 lb (59.9 kg)  Height: 5\' 3"  (1.6 m)    GENERAL: The patient is a well-nourished female, in no acute distress. The vital signs are  documented above. CARDIAC: There is a regular rate and rhythm.  VASCULAR:  Palpable femoral pulse in both groins Palpable pulse in fem fem graft Left DP palpable, no palpable R pedal pulses PULMONARY: There is good air exchange bilaterally without wheezing or rales. ABDOMEN: Soft and non-tender with normal pitched bowel sounds.  MUSCULOSKELETAL: There are no major deformities or cyanosis. NEUROLOGIC: No focal weakness or paresthesias are detected. SKIN: There are no ulcers or rashes noted.   DATA:   I reviewed her noninvasive imaging that again shows proximal anastomosis velocity of 348 concerning for greater than 70% stenosis with monophasic waveforms and then the distal anastomosis had a very low velocity of 36 also with a monophasic waveform.  ABI 0.64 on the right and 0.75 on the left.  Assessment/Plan:  I had a long discussion with Ms. Quentin Cornwall regarding her femoral-femoral graft duplex.  This graft  was placed approximately 17 years ago by Dr. Amedeo Plenty for a left iliac occlusion (at age 78 or so).  Ultimately her right proximal anastomosis in the right groin  has a velocity >348 concerning for a significant stenosis with monophasic waveforms in the distal graft and a very low velocity of 36.  Suggested that all these findings are worrisome for a threatened graft even though she remains asymptomatic.  I propose for the time being that we go to the Cath Lab and perform diagnostic angiography to further evaluate both anastomosis and the graft itself prior to any other intervention.  Discussed at this time could perform balloon angioplasty but ultimately could require open revision in the OR at a later date.  Otherwise, known R ICA occlusion.  Will need ongoing surveillance for L ICA given 60-79% stenosis in setting of previous L CEA and asymptomatic at this time.   Marty Heck, MD [facility] Office: [phone] Pager: [place]

## 2019-01-20 ENCOUNTER — Encounter (HOSPITAL_COMMUNITY): Payer: Self-pay | Admitting: Vascular Surgery

## 2019-01-20 ENCOUNTER — Other Ambulatory Visit: Payer: Self-pay

## 2019-01-20 ENCOUNTER — Ambulatory Visit (HOSPITAL_COMMUNITY)
Admission: RE | Admit: 2019-01-20 | Discharge: 2019-01-20 | Disposition: A | Discharge status: Home / Self Care | Payer: Medicare Other | Attending: Vascular Surgery | Admitting: Vascular Surgery

## 2019-01-20 ENCOUNTER — Encounter (HOSPITAL_COMMUNITY)
Admission: RE | Disposition: A | Discharge status: Home / Self Care | Payer: Self-pay | Source: Home / Self Care | Attending: Vascular Surgery

## 2019-01-20 DIAGNOSIS — Z833 Family history of diabetes mellitus: Secondary | ICD-10-CM | POA: Insufficient documentation

## 2019-01-20 DIAGNOSIS — Z7951 Long term (current) use of inhaled steroids: Secondary | ICD-10-CM | POA: Insufficient documentation

## 2019-01-20 DIAGNOSIS — I6521 Occlusion and stenosis of right carotid artery: Secondary | ICD-10-CM | POA: Insufficient documentation

## 2019-01-20 DIAGNOSIS — Z7902 Long term (current) use of antithrombotics/antiplatelets: Secondary | ICD-10-CM | POA: Insufficient documentation

## 2019-01-20 DIAGNOSIS — E1151 Type 2 diabetes mellitus with diabetic peripheral angiopathy without gangrene: Secondary | ICD-10-CM | POA: Insufficient documentation

## 2019-01-20 DIAGNOSIS — M160 Bilateral primary osteoarthritis of hip: Secondary | ICD-10-CM | POA: Insufficient documentation

## 2019-01-20 DIAGNOSIS — I10 Essential (primary) hypertension: Secondary | ICD-10-CM | POA: Diagnosis not present

## 2019-01-20 DIAGNOSIS — Z95820 Peripheral vascular angioplasty status with implants and grafts: Secondary | ICD-10-CM | POA: Diagnosis not present

## 2019-01-20 DIAGNOSIS — Z79899 Other long term (current) drug therapy: Secondary | ICD-10-CM | POA: Insufficient documentation

## 2019-01-20 DIAGNOSIS — Z888 Allergy status to other drugs, medicaments and biological substances status: Secondary | ICD-10-CM | POA: Diagnosis not present

## 2019-01-20 DIAGNOSIS — I70212 Atherosclerosis of native arteries of extremities with intermittent claudication, left leg: Secondary | ICD-10-CM | POA: Diagnosis not present

## 2019-01-20 DIAGNOSIS — Z7984 Long term (current) use of oral hypoglycemic drugs: Secondary | ICD-10-CM | POA: Insufficient documentation

## 2019-01-20 DIAGNOSIS — Z8673 Personal history of transient ischemic attack (TIA), and cerebral infarction without residual deficits: Secondary | ICD-10-CM | POA: Insufficient documentation

## 2019-01-20 DIAGNOSIS — Z9851 Tubal ligation status: Secondary | ICD-10-CM | POA: Insufficient documentation

## 2019-01-20 DIAGNOSIS — F17210 Nicotine dependence, cigarettes, uncomplicated: Secondary | ICD-10-CM | POA: Diagnosis not present

## 2019-01-20 DIAGNOSIS — Z7982 Long term (current) use of aspirin: Secondary | ICD-10-CM | POA: Diagnosis not present

## 2019-01-20 DIAGNOSIS — Z8249 Family history of ischemic heart disease and other diseases of the circulatory system: Secondary | ICD-10-CM | POA: Diagnosis not present

## 2019-01-20 DIAGNOSIS — Z91041 Radiographic dye allergy status: Secondary | ICD-10-CM | POA: Diagnosis not present

## 2019-01-20 DIAGNOSIS — E785 Hyperlipidemia, unspecified: Secondary | ICD-10-CM | POA: Diagnosis not present

## 2019-01-20 DIAGNOSIS — T82858A Stenosis of vascular prosthetic devices, implants and grafts, initial encounter: Secondary | ICD-10-CM

## 2019-01-20 HISTORY — PX: ABDOMINAL AORTOGRAM W/LOWER EXTREMITY: CATH118223

## 2019-01-20 LAB — POCT I-STAT 4, (NA,K, GLUC, HGB,HCT)
Glucose, Bld: 113 mg/dL — ABNORMAL HIGH (ref 70–99)
HEMATOCRIT: 45 % (ref 36.0–46.0)
Hemoglobin: 15.3 g/dL — ABNORMAL HIGH (ref 12.0–15.0)
Potassium: 4 mmol/L (ref 3.5–5.1)
Sodium: 139 mmol/L (ref 135–145)

## 2019-01-20 LAB — GLUCOSE, CAPILLARY
Glucose-Capillary: 109 mg/dL — ABNORMAL HIGH (ref 70–99)
Glucose-Capillary: 161 mg/dL — ABNORMAL HIGH (ref 70–99)

## 2019-01-20 LAB — POCT I-STAT CREATININE: Creatinine, Ser: 0.4 mg/dL — ABNORMAL LOW (ref 0.44–1.00)

## 2019-01-20 SURGERY — ABDOMINAL AORTOGRAM W/LOWER EXTREMITY
Anesthesia: LOCAL

## 2019-01-20 MED ORDER — DIPHENHYDRAMINE HCL 50 MG/ML IJ SOLN
25.0000 mg | INTRAMUSCULAR | Status: AC
  Administered 2019-01-20: 25 mg via INTRAVENOUS
  Filled 2019-01-20: qty 1

## 2019-01-20 MED ORDER — LIDOCAINE HCL (PF) 1 % IJ SOLN
INTRAMUSCULAR | Status: AC
  Filled 2019-01-20: qty 30

## 2019-01-20 MED ORDER — FAMOTIDINE IN NACL 20-0.9 MG/50ML-% IV SOLN
20.0000 mg | INTRAVENOUS | Status: AC
  Administered 2019-01-20: 20 mg via INTRAVENOUS
  Filled 2019-01-20: qty 50

## 2019-01-20 MED ORDER — HEPARIN (PORCINE) IN NACL 1000-0.9 UT/500ML-% IV SOLN
INTRAVENOUS | Status: DC | PRN
  Administered 2019-01-20 (×2): 500 mL

## 2019-01-20 MED ORDER — HYDRALAZINE HCL 20 MG/ML IJ SOLN: 5.0000 mg | INTRAMUSCULAR | Status: DC | PRN

## 2019-01-20 MED ORDER — HEPARIN (PORCINE) IN NACL 1000-0.9 UT/500ML-% IV SOLN
INTRAVENOUS | Status: AC
  Filled 2019-01-20: qty 1000

## 2019-01-20 MED ORDER — SODIUM CHLORIDE 0.9% FLUSH: 3.0000 mL | INTRAVENOUS | Status: DC | PRN

## 2019-01-20 MED ORDER — HEPARIN SODIUM (PORCINE) 1000 UNIT/ML IJ SOLN
INTRAMUSCULAR | Status: DC | PRN
  Administered 2019-01-20: 5000 [IU] via INTRAVENOUS

## 2019-01-20 MED ORDER — LIDOCAINE HCL (PF) 1 % IJ SOLN
INTRAMUSCULAR | Status: DC | PRN
  Administered 2019-01-20: 15 mL via INTRADERMAL

## 2019-01-20 MED ORDER — SODIUM CHLORIDE 0.9% FLUSH: 3.0000 mL | Freq: Two times a day (BID) | INTRAVENOUS | Status: DC

## 2019-01-20 MED ORDER — ACETAMINOPHEN 325 MG PO TABS: 650.0000 mg | ORAL_TABLET | ORAL | Status: DC | PRN

## 2019-01-20 MED ORDER — ONDANSETRON HCL 4 MG/2ML IJ SOLN: 4.0000 mg | Freq: Four times a day (QID) | INTRAMUSCULAR | Status: DC | PRN

## 2019-01-20 MED ORDER — METHYLPREDNISOLONE SODIUM SUCC 125 MG IJ SOLR
125.0000 mg | INTRAMUSCULAR | Status: AC
  Administered 2019-01-20: 125 mg via INTRAVENOUS
  Filled 2019-01-20: qty 2

## 2019-01-20 MED ORDER — IODIXANOL 320 MG/ML IV SOLN
INTRAVENOUS | Status: DC | PRN
  Administered 2019-01-20: 100 mL via INTRA_ARTERIAL

## 2019-01-20 MED ORDER — SODIUM CHLORIDE 0.9 % IV SOLN
INTRAVENOUS | Status: DC
  Administered 2019-01-20: 06:00:00 via INTRAVENOUS

## 2019-01-20 MED ORDER — SODIUM CHLORIDE 0.9 % IV SOLN: 250.0000 mL | INTRAVENOUS | Status: DC | PRN

## 2019-01-20 MED ORDER — LABETALOL HCL 5 MG/ML IV SOLN: 10.0000 mg | INTRAVENOUS | Status: DC | PRN

## 2019-01-20 MED ORDER — SODIUM CHLORIDE 0.9 % WEIGHT BASED INFUSION: 1.0000 mL/kg/h | INTRAVENOUS | Status: DC

## 2019-01-20 SURGICAL SUPPLY — 11 items
CATH BEACON 5 .035 65 KMP TIP (CATHETERS) ×2 IMPLANT
CATH OMNI FLUSH 5F 65CM (CATHETERS) IMPLANT
GUIDEWIRE ANGLED .035X150CM (WIRE) ×2 IMPLANT
KIT MICROPUNCTURE NIT STIFF (SHEATH) ×2 IMPLANT
KIT PV (KITS) ×2 IMPLANT
SHEATH PINNACLE 5F 10CM (SHEATH) ×2 IMPLANT
SYR MEDRAD MARK 7 150ML (SYRINGE) ×2 IMPLANT
TRANSDUCER W/STOPCOCK (MISCELLANEOUS) ×2 IMPLANT
TRAY PV CATH (CUSTOM PROCEDURE TRAY) ×2 IMPLANT
WIRE BENTSON .035X145CM (WIRE) ×2 IMPLANT
WIRE ROSEN-J .035X180CM (WIRE) ×2 IMPLANT

## 2019-01-20 NOTE — Discharge Instructions (Signed)

## 2019-01-20 NOTE — Op Note (Signed)
Patient name: Andrea [name] MRN: [id number] DOB: June 01, 1966 Sex: female  01/20/2019 Pre-operative Diagnosis: Stenosis on surveillance duplex of right to left femoral-femoral bypass Post-operative diagnosis:  Same Surgeon:  Marty Heck, MD Procedure Performed: 1.  Retrograde ultrasound-guided access of the right to left femoral-femoral bypass graft 2.  Right to left femoral-femoral graft arteriogram 3.  Bilateral lower extremity arteriogram  Indications: Patient is a 53 year old female who previously underwent a right to left femoral-femoral bypass in 2003 by Dr. Amedeo Plenty for left lower extremity claudication.  She has been lost to follow-up for some time and recently re-presented to clinic with nurse practitioner for ongoing surveillance.  She was identified to have a 70% stenosis in the proximal bypass graft with monophasic waveforms in the distal graft and was referred for further evaluation.  She presents today for diagnostic arteriogram and possible intervention after risks benefits were discussed.  She remains asymptomatic and has resumed smoking.  Findings: Approximate 60% stenosis of the right distal external iliac artery/ common femoral junction as inflow to the femoral femoral graft.  Approximate 50% stenosis of the proximal anastomosis of the right to left femorofemoral bypass.  Patient had patent right profunda SFA popliteal and three-vessel runoff in the right lower extremity. The right to left femorofemoral graft is patent throughout its course although very tortuous.  In the left groin there does appear to be a focal lesion at this distal extent of the graft near the femoral anastomosis with an approximate 50% stenosis.  The left lower extremity has a patent SFA profunda popliteal and three-vessel runoff.  Unable to evaluate the right iliac stents given the tortuosity in the graft and acute angulation.    Procedure:  The patient was identified in the holding area and  taken to room 8.  The patient was then placed supine on the table and prepped and draped in the usual sterile fashion.  A time out was called.  Ultrasound was used to evaluate the right to left femoral femoral graft.  It was patent .  A digital ultrasound image was acquired.  A micropuncture needle was used to access the graft on the left side in retrograde fashion.   An 018 wire was advanced without resistance and a micropuncture sheath was placed.  Unfortunately I could only get limited purchase of the micropuncture sheath given significant tortuosity in the graft.  Ultimately had to place a soft angled Glidewire through the micro access sheath that required a lot of manipulation to get across the femorofemoral graft given tortuosity.  Then removed the micro sheath and placed a KMP catheter over the Glidewire into the proximal extent of the femorofemoral graft.  Over this we then exchanged for a Rosen wire for more support and then placed a short 5 French sheath in the femorofemoral graft.  This was flushed with heparinized saline.  I did give the patient 5000 units of IV heparin at this time.  I then use a soft angled glide and a KMP to cross the proximal femoral-femoral anastomosis in the right groin down into the SFA.  Over this I advanced a KMP catheter and obtain several hand injections once I pulled the catheter back into the common femoral artery to evaluate the proximal anastomosis as well as the distal extent of the graft in the distal anastomosis in the left groin.  We also obtained bilateral lower extremity runoff.  Pertinent findings are noted above.    After evaluating the images the highest velocity  was in the right groin and in close review it appeared that she had focal stenosis of the distal external iliac common femoral confluence of approximate 60% with a second approximate 50% stenosis of the proximal graft where it was anastomosed to the femoral artery.  I did not feel that this was  amendable to balloon intervention given ballooning across the common femoral with no good option if I dissected the artery.  The graft itself was patent although very tortuous there was a focal plaque in the distal femorofemoral graft in the left groin near the anastomosis with an approximate 50% stenosis.  Plan: Discussed importance of smoking cessation.  Will have her follow-up with me in 3 months with fem fem duplex, ABI's, and CTA.  Will need to evaluate inflow since iliac stents on right and degree of disease in aorta with consideration for re-do fem fem vs aortobifem in the future.  She remains asymptomatic and would like to delay intervention until she stops smoking.    Marty Heck, MD [facility] [facility] Office: [phone] Pager: [place]

## 2019-01-20 NOTE — H&P (Signed)
History and Physical Interval Note:  01/20/2019 7:36 AM  Andrea [name]  has presented today for surgery, with the diagnosis of pvd  The various methods of treatment have been discussed with the patient and family. After consideration of risks, benefits and other options for treatment, the patient has consented to  Procedure(s): ABDOMINAL AORTOGRAM W/LOWER EXTREMITY (N/A) as a surgical intervention .  The patient's history has been reviewed, patient examined, no change in status, stable for surgery.  I have reviewed the patient's chart and labs.  Questions were answered to the patient's satisfaction.    Aortogram, fem fem arteriogram.  Concern for right groin high grade anastomotic stenosis of fem fem.  Marty Heck  Patient name: Andrea [name]     MRN: [id number]        DOB: [birth year]          Sex: female  REASON FOR CONSULT: Threatened right to left femorofemoral graft  HPI: Andrea [name] is a 53 y.o. female, with known severe peripheral vascular disease that presents for evaluation of threatened right to left femoral-femoral bypass.  Patient states she initially underwent right to left femorofemoral bypass by Dr. Amedeo Plenty approximately 17 years ago (in 2003) - using 7 mm Dacron.  She states that he had to do a revision later.  It appears she had bilateral femoral patch angioplasty on 01/25/04.  She does note that she had a stenting procedure done at [facility] several years ago but unclear what this involved.  She has also undergone a right common iliac artery stent here by Dr. Amedeo Plenty.  She has been followed by [facility] for surveillance of her bilateral carotid disease (patient's had bilateral carotid endarterectomies and has a known right ICA occlusion).  She has also been undergoing surveillance of her femorofemoral bypass.  She reports no lower extremity claudication rest pain or new tissue loss.  On her duplex from 01/07/2019 she had a proximal anastomosis velocity of 348 concerning for  greater than 70% stenosis with monophasic waveforms and then the distal anastomosis had a very low velocity of 36 also with a monophasic waveform.  ABI 0.64 on the right and 0.75 on the left.  Looking back in care everywhere her last duplex was in 2016 with a velocity of over 400 at the right femoral anastomosis but the remainder of the velocity in the graft were over 70 and ABI 1.0 at that time.  She is back to smoking.      Past Medical History:  Diagnosis Date  . Allergy   . Anemia   . Anxiety    on Alprazolam  . Arthritis    both hips  . Cancer ([place])    skin cancer  . Carotid artery occlusion    a. s/p prior endarterectomies with known RICA occlusion - followed by [facility].  . Depression    on Effexor  . Diabetes mellitus with circulatory complication ([place])    7124  . Diastolic dysfunction without heart failure   . Heart murmur    was seen by cardiologist in [facility] 2013  . History of kidney stones   . Hyperlipidemia   . Hypertension   . Peripheral vascular disease ([place])   . PVD (peripheral vascular disease) ([place])    a. s/p prior LE bypass grafting and revision (followed by [facility]).  [place] TIA (transient ischemic attack)    a. possible mini stroke in 2015 - was told she did not have full stroke.  Past Surgical History:  Procedure Laterality Date  . 2 stents    . CAROTID ENDARTERECTOMY    . CESAREAN SECTION    . CHOLECYSTECTOMY    . EXTRACORPOREAL SHOCK WAVE LITHOTRIPSY Left 03/29/2018   Procedure: LEFT EXTRACORPOREAL SHOCK WAVE LITHOTRIPSY (ESWL);  Surgeon: Franchot Gallo, MD;  Location: [facility];  Service: Urology;  Laterality: Left;  . FEMORAL BYPASS     x 2 on right side due to re-stenosis  . left/right carotid artery     2003 and 2009 for right, right side is 100% occluded per patient; left side 2009  . TUBAL LIGATION           Family History  Problem Relation Age of Onset  . Diabetes Mother   . Heart disease  Mother   . Hyperlipidemia Mother   . Hypertension Mother   . Heart disease Father        MI, unknown age  . Hyperlipidemia Father   . Hypertension Father   . Diabetes Maternal Grandmother   . Heart disease Maternal Grandmother   . Hyperlipidemia Maternal Grandmother   . Cancer Paternal Grandmother   . Cancer Sister   . Varicose Veins Sister   . Heart attack Brother        Age 4    SOCIAL HISTORY: Social History        Socioeconomic History  . Marital status: Married    Spouse name: Not on file  . Number of children: 1  . Years of education: College  . Highest education level: Not on file  Occupational History  . Occupation: retired/disabled    Fish farm manager: UNEMPLOYED  Social Needs  . Financial resource strain: Not on file  . Food insecurity:    Worry: Not on file    Inability: Not on file  . Transportation needs:    Medical: Not on file    Non-medical: Not on file  Tobacco Use  . Smoking status: Current Every Day Smoker    Packs/day: 1.00    Years: 40.00    Pack years: 40.00    Types: Cigarettes  . Smokeless tobacco: Never Used  Substance and Sexual Activity  . Alcohol use: Yes    Alcohol/week: 0.0 standard drinks    Comment: holidays/special occas 3-4 drinks  . Drug use: No  . Sexual activity: Not Currently    Partners: Male  Lifestyle  . Physical activity:    Days per week: 0 days    Minutes per session: 0 min  . Stress: To some extent  Relationships  . Social connections:    Talks on phone: More than three times a week    Gets together: Twice a week    Attends religious service: 1 to 4 times per year    Active member of club or organization: Yes    Attends meetings of clubs or organizations: 1 to 4 times per year    Relationship status: Married  . Intimate partner violence:    Fear of current or ex partner: No    Emotionally abused: No    Physically abused: No    Forced sexual  activity: No  Other Topics Concern  . Not on file  Social History Narrative   Pt is from [facility]   Patient lives at home with her husband and son   Patient is right handed   Patient drinks coffee daily         Allergies  Allergen Reactions  . Atorvastatin  Muscle cramps  . Other Hives    Contrast dye   . Rosuvastatin Nausea And Vomiting    Nausea/vomiting          Current Outpatient Medications  Medication Sig Dispense Refill  . amLODipine (NORVASC) 10 MG tablet Take 1 tablet (10 mg total) by mouth daily. 90 tablet 3  . aspirin 81 MG tablet Take 1 tablet (81 mg total) by mouth daily. 30 tablet 11  . cetirizine (ZYRTEC) 10 MG tablet Take 1 tablet (10 mg total) by mouth daily. 90 tablet 3  . clopidogrel (PLAVIX) 75 MG tablet Take 1 tablet (75 mg total) by mouth daily. 90 tablet 3  . fluticasone (FLONASE) 50 MCG/ACT nasal spray Place 2 sprays into both nostrils daily. 16 g 3  . Lancets MISC Test blood sugar once daily. 100 each 2  . lisinopril (PRINIVIL,ZESTRIL) 2.5 MG tablet Take 1 tablet (2.5 mg total) by mouth daily. 90 tablet 3  . metFORMIN (GLUCOPHAGE) 1000 MG tablet Take 1 tablet (1,000 mg total) by mouth 2 (two) times daily with a meal. 180 tablet 3  . simvastatin (ZOCOR) 20 MG tablet Take 1 tablet (20 mg total) by mouth at bedtime. 90 tablet 3  . venlafaxine XR (EFFEXOR-XR) 150 MG 24 hr capsule TAKE 1 CAPSULE BY MOUTH DAILY WITH BREAKFAST 90 capsule 3   No current facility-administered medications for this visit.     REVIEW OF SYSTEMS:  [X]  denotes positive finding, [ ]  denotes negative finding Cardiac  Comments:  Chest pain or chest pressure:    Shortness of breath upon exertion:    Short of breath when lying flat:    Irregular heart rhythm:        Vascular    Pain in calf, thigh, or hip brought on by ambulation:    Pain in feet at night that wakes you up from your sleep:     Blood clot in your veins:    Leg  swelling:         Pulmonary    Oxygen at home:    Productive cough:     Wheezing:         Neurologic    Sudden weakness in arms or legs:     Sudden numbness in arms or legs:     Sudden onset of difficulty speaking or slurred speech:    Temporary loss of vision in one eye:     Problems with dizziness:         Gastrointestinal    Blood in stool:     Vomited blood:         Genitourinary    Burning when urinating:     Blood in urine:        Psychiatric    Major depression:         Hematologic    Bleeding problems:    Problems with blood clotting too easily:        Skin    Rashes or ulcers:        Constitutional    Fever or chills:      PHYSICAL EXAM:    Vitals:   01/11/19 1003  BP: (!) 153/76  Pulse: 74  Resp: 20  Temp: (!) 97.4 F (36.3 C)  SpO2: 97%  Weight: 132 lb (59.9 kg)  Height: 5\' 3"  (1.6 m)    GENERAL: The patient is a well-nourished female, in no acute distress. The vital signs are documented above. CARDIAC: There is a regular rate  and rhythm.  VASCULAR:  Palpable femoral pulse in both groins Palpable pulse in fem fem graft Left DP palpable, no palpable R pedal pulses PULMONARY: There is good air exchange bilaterally without wheezing or rales. ABDOMEN: Soft and non-tender with normal pitched bowel sounds.  MUSCULOSKELETAL: There are no major deformities or cyanosis. NEUROLOGIC: No focal weakness or paresthesias are detected. SKIN: There are no ulcers or rashes noted.   DATA:   I reviewed her noninvasive imaging that again shows proximal anastomosis velocity of 348 concerning for greater than 70% stenosis with monophasic waveforms and then the distal anastomosis had a very low velocity of 36 also with a monophasic waveform.  ABI 0.64 on the right and 0.75 on the left.  Assessment/Plan:  I had a long discussion with Andrea [name] regarding her femoral-femoral  graft duplex.  This graft  was placed approximately 17 years ago by Dr. Amedeo Plenty for a left iliac occlusion (at age 39 or so).  Ultimately her right proximal anastomosis in the right groin has a velocity >348 concerning for a significant stenosis with monophasic waveforms in the distal graft and a very low velocity of 36.  Suggested that all these findings are worrisome for a threatened graft even though she remains asymptomatic.  I propose for the time being that we go to the Cath Lab and perform diagnostic angiography to further evaluate both anastomosis and the graft itself prior to any other intervention.  Discussed at this time could perform balloon angioplasty but ultimately could require open revision in the OR at a later date.  Otherwise, known R ICA occlusion.  Will need ongoing surveillance for L ICA given 60-79% stenosis in setting of previous L CEA and asymptomatic at this time.   Marty Heck, MD [facility] [place] Office: [phone] Pager: [phone]

## 2019-01-25 ENCOUNTER — Encounter (HOSPITAL_COMMUNITY): Payer: Self-pay | Admitting: Vascular Surgery
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
## 2019-08-10 ENCOUNTER — Ambulatory Visit (INDEPENDENT_AMBULATORY_CARE_PROVIDER_SITE_OTHER): Payer: Medicare Other | Admitting: Physician Assistant

## 2019-08-10 ENCOUNTER — Encounter: Payer: Self-pay | Admitting: *Deleted

## 2019-08-10 ENCOUNTER — Encounter (INDEPENDENT_AMBULATORY_CARE_PROVIDER_SITE_OTHER): Payer: Self-pay

## 2019-08-10 ENCOUNTER — Other Ambulatory Visit: Payer: Self-pay

## 2019-08-10 ENCOUNTER — Encounter: Payer: Self-pay | Admitting: Physician Assistant

## 2019-08-10 VITALS — BP 118/68 | HR 77 | Ht 63.0 in | Wt 130.8 lb

## 2019-08-10 DIAGNOSIS — I10 Essential (primary) hypertension: Secondary | ICD-10-CM

## 2019-08-10 DIAGNOSIS — R002 Palpitations: Secondary | ICD-10-CM

## 2019-08-10 DIAGNOSIS — R0602 Shortness of breath: Secondary | ICD-10-CM

## 2019-08-10 DIAGNOSIS — E785 Hyperlipidemia, unspecified: Secondary | ICD-10-CM | POA: Diagnosis not present

## 2019-08-10 DIAGNOSIS — R9431 Abnormal electrocardiogram [ECG] [EKG]: Secondary | ICD-10-CM

## 2019-08-10 DIAGNOSIS — R0609 Other forms of dyspnea: Secondary | ICD-10-CM

## 2019-08-10 NOTE — Progress Notes (Signed)
Cardiology Office Note    Date:  08/10/2019   ID:  Andrea [name], DOB [birth year], MRN [id number]  PCP:  Patient, No Pcp Per  Cardiologist:  Mertie Moores, MD  Electrophysiologist:  None   Chief Complaint: f/u cardiac risk factors, EKG  History of Present Illness:   Andrea [name] is a 53 y.o. female with history of HTN, HLD, DM2, bilateral carotid disease s/p prior endarterectomies with known RICA occlusion (followed by [facility]), PVD s/p fem-fem BPG (followed by [facility]), anxiety, depression, possible prior TIA 2015, longstanding tobacco since age 58 who presents for routine follow-up. I met her in 07/2018.  She was remotely evaluated by a nuclear stress test in 2003 which showed mild anteroapical thinning felt due to breast attenuation artifact. She has prior entry of atrial fib into her chart but the patient denies this. Neuro note in 2015 indicated she had reported these were ectopic beats only. She was evaluated by cardiologist in [facility] remotely and told she only had mild leakiness of a heart valve with a heart murmur. I saw her for cardiovascular screening given her vascular history and risk factors. EKG showed inferior changes at baseline. She also reported rare "flip" that sounded like PAC/PVCs. Dr. Acie Fredrickson did not feel ischemic testing was warranted given asymptomatic nature but did suggest echo for LV evaluation and systolic murmur. This was done 04/2017 showing EF 65-70%, grade 1 DD, moderate LAE, mild TR. Statin was titrated and she followed up with PCP for subsequent monitoring. She did not tolerate atorvastatin due to muscle cramping. She was tried on rosuvastatin by primary care but had nausea/vomiting so was changed back to simvastatin. Last labs 12/2018 showed K 4.0, Hgb 15.3, Cr 0.4, 10/2018 A1C 6.5, 07/2018 LFTs wnl except persistently elevated alk phos, LDL 96. She saw oncology for elevated alk phos who did not find anything acute and recommended consideration of GI source of  this.  She returns for follow-up overall stable. She has rare "blip" palpitation suggestive of infrequent ectopy but no sustained palpitations and not particularly bothersome. We discussed her functional capacity and while she denies any major changes over the last year, she does report she thinks she feels SOB when she goes up steps although her activity is generally limited due to her PVD. No chest pain, diaphoresis, dizziness, or syncope.  Past Medical History:  Diagnosis Date  . Allergy   . Anemia   . Anxiety    on Alprazolam  . Arthritis    both hips  . Cancer ([place])    skin cancer  . Carotid artery occlusion    a. s/p prior endarterectomies with known RICA occlusion - followed by [facility].  . Depression    on Effexor  . Diabetes mellitus with circulatory complication ([place])    1552  . Diastolic dysfunction without heart failure   . Heart murmur    was seen by cardiologist in [facility] 2013  . History of kidney stones   . Hyperlipidemia   . Hypertension   . Peripheral vascular disease ([place])   . PVD (peripheral vascular disease) ([place])    a. s/p prior LE bypass grafting and revision (followed by [facility]).  Marland Kitchen TIA (transient ischemic attack)    a. possible mini stroke in 2015 - was told she did not have full stroke.    Past Surgical History:  Procedure Laterality Date  . 2 stents    . ABDOMINAL AORTOGRAM W/LOWER EXTREMITY N/A 01/20/2019   Procedure: ABDOMINAL AORTOGRAM  W/LOWER EXTREMITY;  Surgeon: Marty Heck, MD;  Location: [place];  Service: Cardiovascular;  Laterality: N/A;  . CAROTID ENDARTERECTOMY    . CESAREAN SECTION    . CHOLECYSTECTOMY    . EXTRACORPOREAL SHOCK WAVE LITHOTRIPSY Left 03/29/2018   Procedure: LEFT EXTRACORPOREAL SHOCK WAVE LITHOTRIPSY (ESWL);  Surgeon: Franchot Gallo, MD;  Location: [facility];  Service: Urology;  Laterality: Left;  . FEMORAL BYPASS     x 2 on right side due to re-stenosis  . left/right carotid artery     2003 and 2009  for right, right side is 100% occluded per patient; left side 2009  . TUBAL LIGATION      Current Medications: Current Meds  Medication Sig  . amLODipine (NORVASC) 10 MG tablet Take 1 tablet (10 mg total) by mouth daily.  Marland Kitchen aspirin 81 MG tablet Take 1 tablet (81 mg total) by mouth daily.  . cetirizine (ZYRTEC) 10 MG tablet Take 10 mg by mouth as needed for allergies.  Marland Kitchen clopidogrel (PLAVIX) 75 MG tablet Take 1 tablet (75 mg total) by mouth daily.  . fluticasone (FLONASE) 50 MCG/ACT nasal spray Place 2 sprays into both nostrils as needed for allergies or rhinitis.  Marland Kitchen ibuprofen (ADVIL,MOTRIN) 200 MG tablet Take 400 mg by mouth every 8 (eight) hours as needed (pain.).  Marland Kitchen Lancets MISC Test blood sugar once daily.  Marland Kitchen lisinopril (PRINIVIL,ZESTRIL) 2.5 MG tablet Take 1 tablet (2.5 mg total) by mouth daily.  . metFORMIN (GLUCOPHAGE) 1000 MG tablet Take 1 tablet (1,000 mg total) by mouth 2 (two) times daily with a meal.  . naproxen sodium (ALEVE) 220 MG tablet Take 440 mg by mouth 2 (two) times daily as needed (pain.).  Marland Kitchen simvastatin (ZOCOR) 20 MG tablet Take 1 tablet (20 mg total) by mouth at bedtime.  Marland Kitchen venlafaxine XR (EFFEXOR-XR) 150 MG 24 hr capsule Take 150 mg by mouth daily with breakfast.     Allergies:   Atorvastatin, Other, and Rosuvastatin   Social History   Socioeconomic History  . Marital status: Married    Spouse name: Not on file  . Number of children: 1  . Years of education: College  . Highest education level: Not on file  Occupational History  . Occupation: retired/disabled    Fish farm manager: UNEMPLOYED  Social Needs  . Financial resource strain: Not on file  . Food insecurity    Worry: Not on file    Inability: Not on file  . Transportation needs    Medical: Not on file    Non-medical: Not on file  Tobacco Use  . Smoking status: Current Every Day Smoker    Packs/day: 1.00    Years: 40.00    Pack years: 40.00    Types: Cigarettes  . Smokeless tobacco: Never Used   Substance and Sexual Activity  . Alcohol use: Yes    Alcohol/week: 0.0 standard drinks    Comment: holidays/special occas 3-4 drinks  . Drug use: No  . Sexual activity: Not Currently    Partners: Male  Lifestyle  . Physical activity    Days per week: 0 days    Minutes per session: 0 min  . Stress: To some extent  Relationships  . Social connections    Talks on phone: More than three times a week    Gets together: Twice a week    Attends religious service: 1 to 4 times per year    Active member of club or organization: Yes  Attends meetings of clubs or organizations: 1 to 4 times per year    Relationship status: Married  Other Topics Concern  . Not on file  Social History Narrative   Pt is from [facility]   Patient lives at home with her husband and son   Patient is right handed   Patient drinks coffee daily     Family History:  The patient's family history includes Cancer in her paternal grandmother and sister; Diabetes in her maternal grandmother and mother; Heart attack in her brother; Heart disease in her father, maternal grandmother, and mother; Hyperlipidemia in her father, maternal grandmother, and mother; Hypertension in her father and mother; Varicose Veins in her sister.  ROS:   Please see the history of present illness.  All other systems are reviewed and otherwise negative.    EKGs/Labs/Other Studies Reviewed:    Studies reviewed were summarized above.   EKG:  EKG is ordered today, personally reviewed, demonstrating NSR 79bpm with short PR interval, nonspecific TW changes similar to prior - slightly accentuated from prior  Recent Labs: 01/20/2019: Creatinine, Ser 0.40; Hemoglobin 15.3; Potassium 4.0; Sodium 139  Recent Lipid Panel    Component Value Date/Time   CHOL 171 07/26/2018 1024   TRIG 187 (H) 07/26/2018 1024   HDL 38 (L) 07/26/2018 1024   CHOLHDL 4.5 (H) 07/26/2018 1024   CHOLHDL 6.7 (H) 08/13/2015 1057   VLDL 62 (H) 08/13/2015 1057    LDLCALC 96 07/26/2018 1024    PHYSICAL EXAM:    VS:  BP 118/68   Pulse 77   Ht '5\' 3"'  (1.6 m)   Wt 130 lb 12.8 oz (59.3 kg)   SpO2 97%   BMI 23.17 kg/m   BMI: Body mass index is 23.17 kg/m.  GEN: Well nourished, well developed WF, in no acute distress HEENT: normocephalic, atraumatic Neck: no JVD or masses, + right carotid bruit and right subclavian bruit Cardiac: RRR; no murmurs, rubs, or gallops, no edema  Respiratory:  Mildly diminished throughout but no wheezing, rales or rhonchi, normal work of breathing GI: soft, nontender, nondistended, + BS MS: no deformity or atrophy Skin: warm and dry, no rash Neuro:  Alert and Oriented x 3, Strength and sensation are intact, follows commands Psych: euthymic mood, full affect  Wt Readings from Last 3 Encounters:  08/10/19 130 lb 12.8 oz (59.3 kg)  01/20/19 128 lb (58.1 kg)  01/11/19 132 lb (59.9 kg)     ASSESSMENT & PLAN:   1. Abnormal EKG - changes are slightly accentuated from prior. Pressing the patient further she does admit to some chronic dyspnea. Given her vascular disease and risk factors I am inclined to think she also has CAD, it would just be a matter of whether there is active ischemia. She has contrast dye allergy so will plan for Lexiscan nuclear stress test. Procedure risks/benefits discussed with patient who is agreeable. Will also update labs today. 2. Palpitations - rare, likely PACs or PVCs, not frequent enough to require intervention or monitoring. Surveillance recommended. 3. Essential HTN - controlled. 4. Hyperlipidemia - due for repeat labs, will obtain CMET/lipid profile. Despite late nature of the day she hadn't yet eaten anything. I had recommended referral to lipid clinic on last year's labs but not clear what happened, do not see result note from triage at that time. Cannot titrate simvastatin due to concomitant amlodipine. Will refer for consideration of PCSK9.   Disposition: F/u with Dr. Acie Fredrickson in 1 year,  sooner if nuclear  stress test is abnormal.  Medication Adjustments/Labs and Tests Ordered: Current medicines are reviewed at length with the patient today.  Concerns regarding medicines are outlined above. Medication changes, Labs and Tests ordered today are summarized above and listed in the Patient Instructions accessible in Encounters.   Signed, Charlie Pitter, PA-C  08/10/2019 4:11 PM    [facility] [address], [place], [place]  [address] Phone: [phone]; Fax: [phone]

## 2019-08-10 NOTE — Patient Instructions (Addendum)
Medication Instructions:  Your physician recommends that you continue on your current medications as directed. Please refer to the Current Medication list given to you today.  If you need a refill on your cardiac medications before your next appointment, please call your pharmacy.   Lab work: TODAY:  CMET, CBC, & LIPIDS  If you have labs (blood work) drawn today and your tests are completely normal, you will receive your results only by: Marland Kitchen MyChart Message (if you have MyChart) OR . A paper copy in the mail If you have any lab test that is abnormal or we need to change your treatment, we will call you to review the results.  Testing/Procedures: Your physician has requested that you have a lexiscan myoview. For further information please visit [email or web address]. Please follow instruction sheet, as given.   You have been referred to [place].   Follow-Up: At [facility], you and your health needs are our priority.  As part of our continuing mission to provide you with exceptional heart care, we have created designated Provider Care Teams.  These Care Teams include your primary Cardiologist (physician) and Advanced Practice Providers (APPs -  Physician Assistants and Nurse Practitioners) who all work together to provide you with the care you need, when you need it. You will need a follow up appointment in:  12 months.  Please call our office 2 months in advance to schedule this appointment.  You may see Mertie Moores, MD or one of the following Advanced Practice Providers on your designated Care Team: Richardson Dopp, PA-C [place], [place] . Daune Perch, NP  Any Other Special Instructions Will Be Listed Below (If Applicable).

## 2019-08-11 ENCOUNTER — Telehealth (HOSPITAL_COMMUNITY): Payer: Self-pay | Admitting: *Deleted

## 2019-08-11 ENCOUNTER — Telehealth: Payer: Self-pay | Admitting: *Deleted

## 2019-08-11 LAB — COMPREHENSIVE METABOLIC PANEL
ALT: 6 IU/L (ref 0–32)
AST: 12 IU/L (ref 0–40)
Albumin/Globulin Ratio: 1.7 (ref 1.2–2.2)
Albumin: 4.9 g/dL (ref 3.8–4.9)
Alkaline Phosphatase: 108 IU/L (ref 39–117)
BUN/Creatinine Ratio: 10 (ref 9–23)
BUN: 6 mg/dL (ref 6–24)
Bilirubin Total: <0.2 mg/dL (ref 0.0–1.2)
CO2: 25 mmol/L (ref 20–29)
Calcium: 9.9 mg/dL (ref 8.7–10.2)
Chloride: 102 mmol/L (ref 96–106)
Creatinine, Ser: 0.61 mg/dL (ref 0.57–1.00)
GFR calc Af Amer: 118 mL/min/{1.73_m2} (ref >59)
GFR calc non Af Amer: 102 mL/min/{1.73_m2} (ref >59)
Globulin, Total: 2.9 g/dL (ref 1.5–4.5)
Glucose: 82 mg/dL (ref 65–99)
Potassium: 4.6 mmol/L (ref 3.5–5.2)
Sodium: 142 mmol/L (ref 134–144)
Total Protein: 7.8 g/dL (ref 6.0–8.5)

## 2019-08-11 LAB — CBC
Hematocrit: 43.9 % (ref 34.0–46.6)
Hemoglobin: 14.5 g/dL (ref 11.1–15.9)
MCH: 29.2 pg (ref 26.6–33.0)
MCHC: 33 g/dL (ref 31.5–35.7)
MCV: 89 fL (ref 79–97)
Platelets: 360 10*3/uL (ref 150–450)
RBC: 4.96 x10E6/uL (ref 3.77–5.28)
RDW: 13.5 % (ref 11.7–15.4)
WBC: 11.1 10*3/uL — ABNORMAL HIGH (ref 3.4–10.8)

## 2019-08-11 LAB — LIPID PANEL
Chol/HDL Ratio: 5.9 ratio — ABNORMAL HIGH (ref 0.0–4.4)
Cholesterol, Total: 188 mg/dL (ref 100–199)
HDL: 32 mg/dL — ABNORMAL LOW (ref >39)
LDL Calculated: 109 mg/dL — ABNORMAL HIGH (ref 0–99)
Triglycerides: 237 mg/dL — ABNORMAL HIGH (ref 0–149)
VLDL Cholesterol Cal: 47 mg/dL — ABNORMAL HIGH (ref 5–40)

## 2019-08-11 NOTE — Telephone Encounter (Signed)
Patient given detailed instructions per Myocardial Perfusion Study Information Sheet for the test on 08/19/19. Patient notified to arrive 15 minutes early and that it is imperative to arrive on time for appointment to keep from having the test rescheduled.  If you need to cancel or reschedule your appointment, please call the office within 24 hours of your appointment. . Patient verbalized understanding. Kirstie Peri

## 2019-08-11 NOTE — Telephone Encounter (Signed)
Call placed to pt re: lab results, left a message for pt to call back.  

## 2019-08-11 NOTE — Telephone Encounter (Signed)
Pt returned my call and she has been made aware of her lab results. See result note. 

## 2019-08-11 NOTE — Telephone Encounter (Signed)
-----   Message from Charlie Pitter, [place] sent at 08/11/2019  7:45 AM EDT ----- Please let pt know CMET is normal. CBC is normal except WBC mildly elevated - has been mildly elevated in the past too - please give her PCP hotline number to review as this will need to be further monitored by primary care. Cholesterol remains too high. Continue plan as discussed for lipid clinic. Dayna Dunn PA-C

## 2019-08-12 ENCOUNTER — Other Ambulatory Visit: Payer: Self-pay

## 2019-08-12 NOTE — Patient Outreach (Signed)
[facility]) Care Management  08/12/2019  Andrea [name] 1966/12/11 [id number]   Medication Adherence call to [name] HIPPA Compliant Voice message left with a call back number. [name] is showing past due on Simvastatin 20 mg under [name].   [facility] Management Direct Dial [phone]  Fax [phone] [email or web address]

## 2019-08-17 ENCOUNTER — Ambulatory Visit (HOSPITAL_COMMUNITY): Payer: Medicare Other | Attending: Cardiology

## 2019-08-17 ENCOUNTER — Other Ambulatory Visit: Payer: Self-pay

## 2019-08-17 DIAGNOSIS — R0602 Shortness of breath: Secondary | ICD-10-CM | POA: Diagnosis present

## 2019-08-17 LAB — MYOCARDIAL PERFUSION IMAGING
LV dias vol: 56 mL (ref 46–106)
LV sys vol: 15 mL
Peak HR: 102 {beats}/min
Rest HR: 64 {beats}/min
SDS: 4
SRS: 3
SSS: 7
TID: 1.04

## 2019-08-17 MED ORDER — REGADENOSON 0.4 MG/5ML IV SOLN
0.4000 mg | Freq: Once | INTRAVENOUS | Status: AC
  Administered 2019-08-17: 0.4 mg via INTRAVENOUS

## 2019-08-17 MED ORDER — TECHNETIUM TC 99M TETROFOSMIN IV KIT
10.4000 | PACK | Freq: Once | INTRAVENOUS | Status: AC | PRN
  Administered 2019-08-17: 10.4 via INTRAVENOUS
  Filled 2019-08-17: qty 11

## 2019-08-17 MED ORDER — TECHNETIUM TC 99M TETROFOSMIN IV KIT
30.5000 | PACK | Freq: Once | INTRAVENOUS | Status: AC | PRN
  Administered 2019-08-17: 30.5 via INTRAVENOUS
  Filled 2019-08-17: qty 31

## 2019-09-05 ENCOUNTER — Other Ambulatory Visit: Payer: Self-pay

## 2019-09-05 ENCOUNTER — Ambulatory Visit (INDEPENDENT_AMBULATORY_CARE_PROVIDER_SITE_OTHER): Payer: Medicare Other

## 2019-09-05 VITALS — BP 128/58 | HR 75

## 2019-09-05 DIAGNOSIS — E785 Hyperlipidemia, unspecified: Secondary | ICD-10-CM

## 2019-09-05 NOTE — Progress Notes (Signed)
Patient ID: [name]                 DOB: 06-Jan-1966                    MRN: [id number]     HPI: Andrea [name] is a 53 y.o. female patient referred to lipid clinic by Andrea Copa, PA. PMH is significant for HTN, HLD, DM2, bilateral carotid disease s/p prior endarterectomies with known RICA occlusion (followed by [facility]), PVD s/p fem-fem BPG (followed by [facility]), anxiety, depression, possible prior TIA 2015, longstanding tobacco since age 23. Last seen by cardiology on 8/19. Referred to lipid clinic for possible PCSK9 inhibitor since concomitant amlodipine prevents uptitration of simvastatin.  Patient presents for initial visit in lipid clinic today. She states she has not been able to tolerate atorvastatin in the past due to muscle aches and pains as well as generic Crestor because of dizzy spells and nausea. She states she spoke with Dayna about possibly starting an injectable medicine for her cholesterol. Patient reports adherence to her current medications and has not had any adverse effects with her simvastatin.   Current Medications: simvastatin 20 mg daily  Intolerances: rosuvastatin (nausea, vomiting), atorvastatin (muscle cramps)  Risk Factors: smoking, HTN, DM2, CAD  LDL goal: <70  Diet: "I eat when I get hungry", normally does not eat until 2-3pm in the afternoon, eats lots of salad and chicken, shellfish, fruits, minimized amount of carbohydrates with diabetes, adds salt onto some foods Drinks Brisk Lemon Tea and coffee with cream (tries to do sugar free)  Exercise: Walks around the house and at her part time job 3 days per week. Says her hip bothers her when she does strenuous activity.  Family History: cancer in her paternal grandmother and sister; Diabetes in her maternal grandmother and mother; Heart attack in her brother; Heart disease in her father, maternal grandmother, and mother; Hyperlipidemia in her father, maternal grandmother, and mother; Hypertension in her father  and mother; Varicose Veins in her sister  Social History: occasional alcohol around holidays/special occasions, every day smoker 1ppd x 40 years.  Labs:  08/10/19: TC 188, TG 237, HDL 32, LDL 109 07/26/18: TC 171, TG 187, HDL 38, LDL 96  Past Medical History:  Diagnosis Date   Allergy    Anemia    Anxiety    on Alprazolam   Arthritis    both hips   Cancer ([place])    skin cancer   Carotid artery occlusion    a. s/p prior endarterectomies with known RICA occlusion - followed by [facility].   Depression    on Effexor   Diabetes mellitus with circulatory complication ([place])    [id number]   Diastolic dysfunction without heart failure    Heart murmur    was seen by cardiologist in [facility] 2013   History of kidney stones    Hyperlipidemia    Hypertension    Peripheral vascular disease ([place])    PVD (peripheral vascular disease) ([place])    a. s/p prior LE bypass grafting and revision (followed by [facility]).   TIA (transient ischemic attack)    a. possible mini stroke in 2015 - was told she did not have full stroke.    Current Outpatient Medications on File Prior to Visit  Medication Sig Dispense Refill   amLODipine (NORVASC) 10 MG tablet Take 1 tablet (10 mg total) by mouth daily. 90 tablet 3   aspirin 81 MG tablet Take 1 tablet (  81 mg total) by mouth daily. 30 tablet 11   cetirizine (ZYRTEC) 10 MG tablet Take 10 mg by mouth as needed for allergies.     clopidogrel (PLAVIX) 75 MG tablet Take 1 tablet (75 mg total) by mouth daily. 90 tablet 3   fluticasone (FLONASE) 50 MCG/ACT nasal spray Place 2 sprays into both nostrils as needed for allergies or rhinitis.     ibuprofen (ADVIL,MOTRIN) 200 MG tablet Take 400 mg by mouth every 8 (eight) hours as needed (pain.).     Lancets MISC Test blood sugar once daily. 100 each 2   lisinopril (PRINIVIL,ZESTRIL) 2.5 MG tablet Take 1 tablet (2.5 mg total) by mouth daily. 90 tablet 3   metFORMIN (GLUCOPHAGE) 1000 MG tablet Take 1 tablet  (1,000 mg total) by mouth 2 (two) times daily with a meal. 180 tablet 3   naproxen sodium (ALEVE) 220 MG tablet Take 440 mg by mouth 2 (two) times daily as needed (pain.).     simvastatin (ZOCOR) 20 MG tablet Take 1 tablet (20 mg total) by mouth at bedtime. 90 tablet 3   venlafaxine XR (EFFEXOR-XR) 150 MG 24 hr capsule Take 150 mg by mouth daily with breakfast.     No current facility-administered medications on file prior to visit.     Allergies  Allergen Reactions   Atorvastatin     Muscle cramps   Other Hives    Contrast dye    Rosuvastatin Nausea And Vomiting    Nausea/vomiting    Assessment/Plan:  1. Hyperlipidemia - LDL is above goal at 109 (goal <70). Patient previously unable to tolerate atorvastatin or rosuvastatin. Currently on simvastatin 20 mg once daily. Patient is interested in starting PCSK9 inhibitor for further lipid lowering effects. Educated on proper use, dosing, frequency, and possible adverse effects from the medication. Will start prior approval for Praluent 75 mg subcutaneously every 2 weeks. Will call patient and send prescription to pharmacy once approved. Continue taking simvastatin 20 mg daily. Attempted to counsel on the importance of smoking cessation but patient was not interested at this time. Counseled to minimize the amount of salt added to food, minimizing sugary beverages, and exercise strategies to be able to do at home.  Follow up in clinic after Praluent prescription is approved by insurance. Patient states she may be able to do the injections on her own but would feel more comfortable for a family member to help. Instructed to schedule follow up visit (+/- additional family members) for injection technique again if needed.   Vertis Kelch, PharmD PGY2 Cardiology Pharmacy Resident [place] [id number] [place], [place] [address] Phone: [phone]; Fax: [phone]

## 2019-09-05 NOTE — Patient Instructions (Addendum)
Thank you for seeing [place] today!  We will be sending in information to your insurance company about the injectable medicine for your cholesterol.  We will call you once it is approved and ready to pick up from the pharmacy. You can make a follow up appointment to go over how to give the injection again if you would like at that time.  Please call us at [phone] if you have any questions.

## 2019-09-06 ENCOUNTER — Telehealth: Payer: Self-pay | Admitting: Pharmacist

## 2019-09-06 DIAGNOSIS — E785 Hyperlipidemia, unspecified: Secondary | ICD-10-CM

## 2019-09-06 MED ORDER — PRALUENT 75 MG/ML ~~LOC~~ SOAJ: 1.0000 "pen " | SUBCUTANEOUS | 11 refills | Status: DC

## 2019-09-06 NOTE — Telephone Encounter (Signed)
Praluent prior authorization approved through 03/04/20, rx sent to pharmacy. Pt has Medicare Part D, unable to use copay card. Copay is $40/month, called pt and left her a message.

## 2019-09-07 NOTE — Telephone Encounter (Signed)
Left message again for pt  

## 2019-09-08 NOTE — Telephone Encounter (Signed)
Pt returned call, she will start Praluent after she returns from the beach next week. Scheduled f/u labs in mid November.

## 2019-11-07 ENCOUNTER — Other Ambulatory Visit: Payer: Medicare Other

## 2019-12-07 ENCOUNTER — Other Ambulatory Visit: Payer: Medicare Other

## 2019-12-09 ENCOUNTER — Telehealth: Payer: Self-pay | Admitting: Pharmacist

## 2019-12-09 NOTE — Telephone Encounter (Signed)
Pt canceled lipid panel to check efficacy of PCSK9i therapy - left message to reschedule appt.

## 2019-12-14 NOTE — Telephone Encounter (Signed)
Left another message for pt to reschedule labs.

## 2020-02-13 ENCOUNTER — Telehealth: Payer: Self-pay

## 2020-02-13 MED ORDER — REPATHA SURECLICK 140 MG/ML ~~LOC~~ SOAJ: 140.0000 mg | SUBCUTANEOUS | 11 refills | Status: DC

## 2020-02-13 NOTE — Telephone Encounter (Signed)
Called and lmomed the pt stated that the praluent would not be approved but repatha was... sent rx, instructed the pt to call back if they have issues

## 2020-03-20 ENCOUNTER — Other Ambulatory Visit: Payer: Self-pay

## 2020-03-20 DIAGNOSIS — I739 Peripheral vascular disease, unspecified: Secondary | ICD-10-CM

## 2020-03-28 ENCOUNTER — Telehealth (HOSPITAL_COMMUNITY): Payer: Self-pay

## 2020-03-28 NOTE — Telephone Encounter (Signed)

## 2020-03-29 ENCOUNTER — Other Ambulatory Visit: Payer: Self-pay | Admitting: *Deleted

## 2020-03-29 DIAGNOSIS — I739 Peripheral vascular disease, unspecified: Secondary | ICD-10-CM

## 2020-03-30 ENCOUNTER — Ambulatory Visit (INDEPENDENT_AMBULATORY_CARE_PROVIDER_SITE_OTHER)
Admission: RE | Admit: 2020-03-30 | Discharge: 2020-03-30 | Disposition: A | Discharge status: Home / Self Care | Payer: Medicare PPO | Source: Ambulatory Visit | Attending: Vascular Surgery | Admitting: Vascular Surgery

## 2020-03-30 ENCOUNTER — Ambulatory Visit (HOSPITAL_COMMUNITY)
Admission: RE | Admit: 2020-03-30 | Discharge: 2020-03-30 | Disposition: A | Discharge status: Home / Self Care | Payer: Medicare PPO | Source: Ambulatory Visit | Attending: Vascular Surgery | Admitting: Vascular Surgery

## 2020-03-30 ENCOUNTER — Other Ambulatory Visit: Payer: Self-pay

## 2020-03-30 DIAGNOSIS — I739 Peripheral vascular disease, unspecified: Secondary | ICD-10-CM | POA: Insufficient documentation

## 2020-04-12 ENCOUNTER — Other Ambulatory Visit: Payer: Self-pay | Admitting: *Deleted

## 2020-04-12 MED ORDER — PREDNISONE 50 MG PO TABS: 50.0000 mg | ORAL_TABLET | ORAL | 0 refills | Status: DC

## 2020-04-13 ENCOUNTER — Other Ambulatory Visit: Payer: Self-pay

## 2020-04-13 ENCOUNTER — Ambulatory Visit
Admission: RE | Admit: 2020-04-13 | Discharge: 2020-04-13 | Disposition: A | Discharge status: Home / Self Care | Payer: Medicare PPO | Source: Ambulatory Visit | Attending: Vascular Surgery | Admitting: Vascular Surgery

## 2020-04-13 DIAGNOSIS — I739 Peripheral vascular disease, unspecified: Secondary | ICD-10-CM

## 2020-04-13 MED ORDER — IOPAMIDOL (ISOVUE-370) INJECTION 76%
75.0000 mL | Freq: Once | INTRAVENOUS | Status: AC | PRN
  Administered 2020-04-13: 75 mL via INTRAVENOUS

## 2020-04-16 ENCOUNTER — Telehealth (HOSPITAL_COMMUNITY): Payer: Self-pay

## 2020-04-16 NOTE — Telephone Encounter (Signed)

## 2020-04-17 ENCOUNTER — Ambulatory Visit: Payer: Medicare PPO | Admitting: Vascular Surgery

## 2020-05-15 ENCOUNTER — Encounter: Payer: Self-pay | Admitting: Vascular Surgery

## 2020-05-15 ENCOUNTER — Ambulatory Visit: Payer: Medicare PPO | Admitting: Vascular Surgery

## 2020-05-15 ENCOUNTER — Other Ambulatory Visit: Payer: Self-pay

## 2020-05-15 VITALS — BP 110/62 | HR 75 | Temp 97.2°F | Resp 16 | Ht 63.0 in | Wt 124.0 lb

## 2020-05-15 DIAGNOSIS — I739 Peripheral vascular disease, unspecified: Secondary | ICD-10-CM

## 2020-05-15 DIAGNOSIS — I6523 Occlusion and stenosis of bilateral carotid arteries: Secondary | ICD-10-CM

## 2020-05-15 NOTE — Progress Notes (Signed)
Patient name: Andrea [name] MRN: [id number] DOB: 1966-02-26 Sex: female  REASON FOR CONSULT: Follow-up for surveillance of her right to left femoral-femoral bypass as well as right iliac stents; also due for surveillance of her carotid artery disease  HPI: Andrea [name] is a 54 y.o. female, with known severe peripheral vascular disease that presents for ongoing surveillance of right to left femoral-femoral bypass.  Patient initially underwent right to left femorofemoral bypass by Dr. Amedeo Plenty approximately 17 years ago (in 2003) - using 7 mm Dacron.  She had to do a revision later with bilateral femoral patch angioplasty on 01/25/04. She has also undergone a right common iliac artery stent here by Dr. Amedeo Plenty and another iliac stent at [facility].  She has been followed by [facility] for surveillance of her bilateral carotid disease (patient's had bilateral carotid endarterectomies and has a known right ICA occlusion).   Ultimately we performed a diagnostic arteriogram of her femorofemoral bypass last year after surveillance showed a greater than 70% stenosis in the right groin.  No intervention was performed and we had planned follow-up with femorofemoral duplex as well as a CTA to evaluate her inflow (was unable to evaluate iliacs at time of arteriogram last year given tortuosity in graft and acute angulation).  Ultimate this was delayed with Covid.  She now follows up with her CTA abdomen pelvis as well as updated duplex now about 16 months later.  Reports no new issues with the lower extremities.  States she is walking and working without any problems.  She is also due for carotid surveillance.  No hx of TIA or stroke in past 6 months.  Past Medical History:  Diagnosis Date  . Allergy   . Anemia   . Anxiety    on Alprazolam  . Arthritis    both hips  . Cancer ([place])    skin cancer  . Carotid artery occlusion    a. s/p prior endarterectomies with known RICA occlusion - followed by [facility].  .  Depression    on Effexor  . Diabetes mellitus with circulatory complication ([place])    [id number]  . Diastolic dysfunction without heart failure   . Heart murmur    was seen by cardiologist in [facility] 2013  . History of kidney stones   . Hyperlipidemia   . Hypertension   . Peripheral vascular disease ([place])   . PVD (peripheral vascular disease) ([place])    a. s/p prior LE bypass grafting and revision (followed by [facility]).  Marland Kitchen TIA (transient ischemic attack)    a. possible mini stroke in 2015 - was told she did not have full stroke.    Past Surgical History:  Procedure Laterality Date  . 2 stents    . ABDOMINAL AORTOGRAM W/LOWER EXTREMITY N/A 01/20/2019   Procedure: ABDOMINAL AORTOGRAM W/LOWER EXTREMITY;  Surgeon: Marty Heck, MD;  Location: [facility];  Service: Cardiovascular;  Laterality: N/A;  . CAROTID ENDARTERECTOMY    . CESAREAN SECTION    . CHOLECYSTECTOMY    . EXTRACORPOREAL SHOCK WAVE LITHOTRIPSY Left 03/29/2018   Procedure: LEFT EXTRACORPOREAL SHOCK WAVE LITHOTRIPSY (ESWL);  Surgeon: Franchot Gallo, MD;  Location: [facility];  Service: Urology;  Laterality: Left;  . FEMORAL BYPASS     x 2 on right side due to re-stenosis  . left/right carotid artery     2003 and 2009 for right, right side is 100% occluded per patient; left side 2009  . TUBAL LIGATION  Family History  Problem Relation Age of Onset  . Diabetes Mother   . Heart disease Mother   . Hyperlipidemia Mother   . Hypertension Mother   . Heart disease Father        MI, unknown age  . Hyperlipidemia Father   . Hypertension Father   . Diabetes Maternal Grandmother   . Heart disease Maternal Grandmother   . Hyperlipidemia Maternal Grandmother   . Cancer Paternal Grandmother   . Cancer Sister   . Varicose Veins Sister   . Heart attack Brother        Age 84    SOCIAL HISTORY: Social History   Socioeconomic History  . Marital status: Married    Spouse name: Not on file  . Number of children:  1  . Years of education: College  . Highest education level: Not on file  Occupational History  . Occupation: retired/disabled    Employer: UNEMPLOYED  Tobacco Use  . Smoking status: Current Every Day Smoker    Packs/day: 1.00    Years: 40.00    Pack years: 40.00    Types: Cigarettes  . Smokeless tobacco: Never Used  Substance and Sexual Activity  . Alcohol use: Yes    Alcohol/week: 0.0 standard drinks    Comment: holidays/special occas 3-4 drinks  . Drug use: No  . Sexual activity: Not Currently    Partners: Male  Other Topics Concern  . Not on file  Social History Narrative   Pt is from [facility]   Patient lives at home with her husband and son   Patient is right handed   Patient drinks coffee daily   Social Determinants of Health   Financial Resource Strain:   . Difficulty of Paying Living Expenses:   Food Insecurity:   . Worried About Charity fundraiser in the Last Year:   . Arboriculturist in the Last Year:   Transportation Needs:   . Film/video editor (Medical):   Marland Kitchen Lack of Transportation (Non-Medical):   Physical Activity:   . Days of Exercise per Week:   . Minutes of Exercise per Session:   Stress:   . Feeling of Stress :   Social Connections:   . Frequency of Communication with Friends and Family:   . Frequency of Social Gatherings with Friends and Family:   . Attends Religious Services:   . Active Member of Clubs or Organizations:   . Attends Archivist Meetings:   Marland Kitchen Marital Status:   Intimate Partner Violence:   . Fear of Current or Ex-Partner:   . Emotionally Abused:   Marland Kitchen Physically Abused:   . Sexually Abused:     Allergies  Allergen Reactions  . Atorvastatin     Muscle cramps  . Iohexol Hives    Patient should take 13 hours prep   . Other Hives    Contrast dye   . Rosuvastatin Nausea And Vomiting    Nausea/vomiting    Current Outpatient Medications  Medication Sig Dispense Refill  . amLODipine (NORVASC) 10 MG  tablet Take 1 tablet (10 mg total) by mouth daily. 90 tablet 3  . aspirin 81 MG tablet Take 1 tablet (81 mg total) by mouth daily. 30 tablet 11  . cetirizine (ZYRTEC) 10 MG tablet Take 10 mg by mouth as needed for allergies.    Marland Kitchen clopidogrel (PLAVIX) 75 MG tablet Take 1 tablet (75 mg total) by mouth daily. 90 tablet 3  . Evolocumab (REPATHA  SURECLICK) XX123456 MG/ML SOAJ Inject 140 mg into the skin every 14 (fourteen) days. 2 pen 11  . fluticasone (FLONASE) 50 MCG/ACT nasal spray Place 2 sprays into both nostrils as needed for allergies or rhinitis.    Marland Kitchen ibuprofen (ADVIL,MOTRIN) 200 MG tablet Take 400 mg by mouth every 8 (eight) hours as needed (pain.).    Marland Kitchen Lancets MISC Test blood sugar once daily. 100 each 2  . lisinopril (PRINIVIL,ZESTRIL) 2.5 MG tablet Take 1 tablet (2.5 mg total) by mouth daily. 90 tablet 3  . metFORMIN (GLUCOPHAGE) 1000 MG tablet Take 1 tablet (1,000 mg total) by mouth 2 (two) times daily with a meal. 180 tablet 3  . naproxen sodium (ALEVE) 220 MG tablet Take 440 mg by mouth 2 (two) times daily as needed (pain.).    Marland Kitchen predniSONE (DELTASONE) 50 MG tablet Take 1 tablet (50 mg total) by mouth as directed. Take one tablet 04/12/20 at 10:20 pm one tablet 04/13/20 at 4:20 am and last tablet  at 10:20 am 3 tablet 0  . simvastatin (ZOCOR) 20 MG tablet Take 1 tablet (20 mg total) by mouth at bedtime. 90 tablet 3  . venlafaxine XR (EFFEXOR-XR) 150 MG 24 hr capsule Take 150 mg by mouth daily with breakfast.     No current facility-administered medications for this visit.    REVIEW OF SYSTEMS:  [X]  denotes positive finding, [ ]  denotes negative finding Cardiac  Comments:  Chest pain or chest pressure:    Shortness of breath upon exertion:    Short of breath when lying flat:    Irregular heart rhythm:        Vascular    Pain in calf, thigh, or hip brought on by ambulation:    Pain in feet at night that wakes you up from your sleep:     Blood clot in your veins:    Leg swelling:          Pulmonary    Oxygen at home:    Productive cough:     Wheezing:         Neurologic    Sudden weakness in arms or legs:     Sudden numbness in arms or legs:     Sudden onset of difficulty speaking or slurred speech:    Temporary loss of vision in one eye:     Problems with dizziness:         Gastrointestinal    Blood in stool:     Vomited blood:         Genitourinary    Burning when urinating:     Blood in urine:        Psychiatric    Major depression:         Hematologic    Bleeding problems:    Problems with blood clotting too easily:        Skin    Rashes or ulcers:        Constitutional    Fever or chills:      PHYSICAL EXAM: There were no vitals filed for this visit.  GENERAL: The patient is a well-nourished female, in no acute distress. The vital signs are documented above. CARDIAC: There is a regular rate and rhythm.  VASCULAR:  Palpable femoral pulse in both groins Palpable pulse in fem fem graft PULMONARY: There is good air exchange bilaterally without wheezing or rales. ABDOMEN: Soft and non-tender with normal pitched bowel sounds.  MUSCULOSKELETAL: There are no major deformities or cyanosis.  NEUROLOGIC: No focal weakness or paresthesias are detected. SKIN: There are no ulcers or rashes noted.   DATA:   I reviewed her CTA abdomen pelvis and she has infrarenal aortic disease but less than 50% stenosis and her right common and external iliac stent looks widely patent.  Obviously right common femoral disease previously documented on arteriogram with a patent femorofemoral bypass that is very tortuous and elongated.  Assessment/Plan:  I had a long discussion with Andrea [name] regarding her femoral-femoral bypass.  This graft  was placed approximately 18 years ago by Dr. Amedeo Plenty for a left iliac occlusion (at age 73).  We have been watching right groin stenosis on duplex but this remains relatively stable over the last 16 months.  She still has a pulse in  her bypass as well as no symptoms in her lower extremities at this time.  I did get a CTA to further evaluate her infrarenal aorta and iliac stents  and although she has disease in infra-renal aorta there is less than 50% stenosis and both right common and external iliac stent looks widely patent.  Discussed that given really no significant change in velocities over past over the last 16 months and asymptomatic it is probably okay to continue surveillance for now.  She still smoking and encouraged her to try and stop.  I will arrange 61-month follow-up with ABIs and femorofemoral duplex.  We will also order carotid duplex soon as possible.  She has known right ICA occlusion and a moderate left ICA stenosis of 60 to 79%.  She had previous bilateral carotid endarterectomies.  She is due for surveillance and I will contact her with the results.  Suspect if her left ICA stenosis progressed >80% would evaluate for TCAR.      Marty Heck, MD [facility] Office: [place]

## 2020-05-16 ENCOUNTER — Other Ambulatory Visit: Payer: Self-pay

## 2020-05-16 ENCOUNTER — Ambulatory Visit (HOSPITAL_COMMUNITY)
Admission: RE | Admit: 2020-05-16 | Discharge: 2020-05-16 | Disposition: A | Discharge status: Home / Self Care | Payer: Medicare PPO | Source: Ambulatory Visit | Attending: Vascular Surgery | Admitting: Vascular Surgery

## 2020-05-16 DIAGNOSIS — I6523 Occlusion and stenosis of bilateral carotid arteries: Secondary | ICD-10-CM

## 2020-05-17 ENCOUNTER — Other Ambulatory Visit: Payer: Self-pay | Admitting: *Deleted

## 2020-05-17 DIAGNOSIS — I739 Peripheral vascular disease, unspecified: Secondary | ICD-10-CM

## 2020-12-24 ENCOUNTER — Other Ambulatory Visit: Payer: Self-pay | Admitting: *Deleted

## 2020-12-24 DIAGNOSIS — I6523 Occlusion and stenosis of bilateral carotid arteries: Secondary | ICD-10-CM

## 2020-12-25 ENCOUNTER — Other Ambulatory Visit: Payer: Self-pay

## 2020-12-25 ENCOUNTER — Encounter (HOSPITAL_COMMUNITY): Payer: Medicare PPO

## 2020-12-25 ENCOUNTER — Ambulatory Visit (INDEPENDENT_AMBULATORY_CARE_PROVIDER_SITE_OTHER)
Admission: RE | Admit: 2020-12-25 | Discharge: 2020-12-25 | Disposition: A | Discharge status: Home / Self Care | Payer: Medicare PPO | Source: Ambulatory Visit | Attending: Vascular Surgery | Admitting: Vascular Surgery

## 2020-12-25 ENCOUNTER — Ambulatory Visit: Payer: Medicare PPO | Admitting: Vascular Surgery

## 2020-12-25 ENCOUNTER — Ambulatory Visit (HOSPITAL_COMMUNITY)
Admission: RE | Admit: 2020-12-25 | Discharge: 2020-12-25 | Disposition: A | Discharge status: Home / Self Care | Payer: Medicare PPO | Source: Ambulatory Visit | Attending: Vascular Surgery | Admitting: Vascular Surgery

## 2020-12-25 VITALS — BP 130/63 | HR 77 | Temp 98.0°F | Resp 14 | Ht 63.0 in | Wt 125.0 lb

## 2020-12-25 DIAGNOSIS — I6523 Occlusion and stenosis of bilateral carotid arteries: Secondary | ICD-10-CM | POA: Insufficient documentation

## 2020-12-25 DIAGNOSIS — I739 Peripheral vascular disease, unspecified: Secondary | ICD-10-CM

## 2020-12-25 MED ORDER — PREDNISONE 50 MG PO TABS: ORAL_TABLET | ORAL | 0 refills | Status: DC

## 2020-12-25 MED ORDER — DIPHENHYDRAMINE HCL 50 MG PO CAPS: ORAL_CAPSULE | ORAL | 0 refills | Status: DC

## 2020-12-25 NOTE — Progress Notes (Signed)
Patient name: Andrea [name] MRN: [id number] DOB: 02-21-1966 Sex: female  REASON FOR CONSULT: 6 month follow-up for surveillance of her right to left femoral-femoral bypass as well as right iliac stents and surveillance of her carotid artery disease  HPI: Andrea [name] is a 55 y.o. female, with known severe peripheral vascular disease and carotid disease that presents for ongoing surveillance of right to left femoral-femoral bypass and carotid arteries.  Patient initially underwent right to left femorofemoral bypass by Dr. Madilyn FiremanHayes approximately 17 years ago (in 2003) - using 7 mm Dacron.  She had to have a revision later with bilateral femoral patch angioplasty on 01/25/04. She has also undergone a right common iliac artery stent here by Dr. Madilyn FiremanHayes and [facility].   She has known inflow and outflow disease.  In regards to her carotid artery disease she has had a history of bilateral carotid endarterectomies.  Her right carotid was done in 2013 by Dr. Madilyn FiremanHayes and her left carotid was done in 2009 also by Dr. Madilyn FiremanHayes.  She is still smoking a pack a day.  She is on aspirin Plavix statin.  Reports her leg symptoms are stable.  No claudication symptoms.  Walking without issue.  No strokes or TIAs or other neurologic events since last evaluation.  Past Medical History:  Diagnosis Date  . Allergy   . Anemia   . Anxiety    on Alprazolam  . Arthritis    both hips  . Cancer ([facility])    skin cancer  . Carotid artery occlusion    a. s/p prior endarterectomies with known RICA occlusion - followed by [facility].  . Depression    on Effexor  . Diabetes mellitus with circulatory complication ([facility])    2011  . Diastolic dysfunction without heart failure   . Heart murmur    [facility]  . History of kidney stones   . Hyperlipidemia   . Hypertension   . Peripheral vascular disease ([facility])   . PVD (peripheral vascular disease) ([facility])    a. s/p prior LE bypass  grafting and revision (followed by [facility]).  Marland Kitchen. TIA (transient ischemic attack)    a. possible mini stroke in 2015 - was told she did not have full stroke.    Past Surgical History:  Procedure Laterality Date  . 2 stents    . ABDOMINAL AORTOGRAM W/LOWER EXTREMITY N/A 01/20/2019   Procedure: ABDOMINAL AORTOGRAM W/LOWER EXTREMITY;  Surgeon: Cephus Shellinglark, Mariem Skolnick J, MD;  Location: [facility];  Service: Cardiovascular;  Laterality: N/A;  . CAROTID ENDARTERECTOMY    . CESAREAN SECTION    . CHOLECYSTECTOMY    . EXTRACORPOREAL SHOCK WAVE LITHOTRIPSY Left 03/29/2018   Procedure: LEFT EXTRACORPOREAL SHOCK WAVE LITHOTRIPSY (ESWL);  Surgeon: Marcine Matarahlstedt, Stephen, MD;  Location: [facility];  Service: Urology;  Laterality: Left;  . FEMORAL BYPASS     x 2 on right side due to re-stenosis  . left/right carotid artery     2003 and 2009 for right, right side is 100% occluded per patient; left side 2009  . TUBAL LIGATION      Family History  Problem Relation Age of Onset  . Diabetes Mother   . Heart disease Mother   . Hyperlipidemia Mother   . Hypertension Mother   . Heart disease Father        MI, unknown age  . Hyperlipidemia Father   . Hypertension Father   . Diabetes  Maternal Grandmother   . Heart disease Maternal Grandmother   . Hyperlipidemia Maternal Grandmother   . Cancer Paternal Grandmother   . Cancer Sister   . Varicose Veins Sister   . Heart attack Brother        Age 51    SOCIAL HISTORY: Social History   Socioeconomic History  . Marital status: Married    Spouse name: Not on file  . Number of children: 1  . Years of education: College  . Highest education level: Not on file  Occupational History  . Occupation: retired/disabled    Employer: UNEMPLOYED  Tobacco Use  . Smoking status: Current Every Day Smoker    Packs/day: 1.00    Years: 40.00    Pack years: 40.00    Types: Cigarettes  . Smokeless tobacco: Never Used  Vaping Use  . Vaping Use: Never used  Substance and  Sexual Activity  . Alcohol use: Yes    Alcohol/week: 0.0 standard drinks    Comment: holidays/special occas 3-4 drinks  . Drug use: No  . Sexual activity: Not Currently    Partners: Male  Other Topics Concern  . Not on file  Social History Narrative   Pt is from [facility]   Patient lives at home with her husband and son   Patient is right handed   Patient drinks coffee daily   Social Determinants of Health   Financial Resource Strain: Not on file  Food Insecurity: Not on file  Transportation Needs: Not on file  Physical Activity: Not on file  Stress: Not on file  Social Connections: Not on file  Intimate Partner Violence: Not on file    Allergies  Allergen Reactions  . Atorvastatin     Muscle cramps  . Iohexol Hives    Patient should take 13 hours prep   . Other Hives    Contrast dye   . Rosuvastatin Nausea And Vomiting    Nausea/vomiting    Current Outpatient Medications  Medication Sig Dispense Refill  . amLODipine (NORVASC) 10 MG tablet Take 1 tablet (10 mg total) by mouth daily. 90 tablet 3  . aspirin 81 MG tablet Take 1 tablet (81 mg total) by mouth daily. 30 tablet 11  . cetirizine (ZYRTEC) 10 MG tablet Take 10 mg by mouth as needed for allergies.    Marland Kitchen clopidogrel (PLAVIX) 75 MG tablet Take 1 tablet (75 mg total) by mouth daily. 90 tablet 3  . Evolocumab (REPATHA SURECLICK) [id number] MG/ML SOAJ Inject 140 mg into the skin every 14 (fourteen) days. 2 pen 11  . fluticasone (FLONASE) 50 MCG/ACT nasal spray Place 2 sprays into both nostrils as needed for allergies or rhinitis.    Marland Kitchen ibuprofen (ADVIL,MOTRIN) 200 MG tablet Take 400 mg by mouth every 8 (eight) hours as needed (pain.).    Marland Kitchen Lancets MISC Test blood sugar once daily. 100 each 2  . lisinopril (PRINIVIL,ZESTRIL) 2.5 MG tablet Take 1 tablet (2.5 mg total) by mouth daily. 90 tablet 3  . metFORMIN (GLUCOPHAGE) 1000 MG tablet Take 1 tablet (1,000 mg total) by mouth 2 (two) times daily with a meal. 180 tablet 3   . naproxen sodium (ALEVE) 220 MG tablet Take 440 mg by mouth 2 (two) times daily as needed (pain.).    Marland Kitchen simvastatin (ZOCOR) 20 MG tablet Take 1 tablet (20 mg total) by mouth at bedtime. 90 tablet 3  . venlafaxine XR (EFFEXOR-XR) 150 MG 24 hr capsule Take 150 mg by mouth daily with  breakfast.    . predniSONE (DELTASONE) 50 MG tablet Take 1 tablet (50 mg total) by mouth as directed. Take one tablet 04/12/20 at 10:20 pm one tablet 04/13/20 at 4:20 am and last tablet  at 10:20 am (Patient not taking: Reported on 12/25/2020) 3 tablet 0   No current facility-administered medications for this visit.    REVIEW OF SYSTEMS:  [X]  denotes positive finding, [ ]  denotes negative finding Cardiac  Comments:  Chest pain or chest pressure:    Shortness of breath upon exertion:    Short of breath when lying flat:    Irregular heart rhythm:        Vascular    Pain in calf, thigh, or hip brought on by ambulation:    Pain in feet at night that wakes you up from your sleep:     Blood clot in your veins:    Leg swelling:         Pulmonary    Oxygen at home:    Productive cough:     Wheezing:         Neurologic    Sudden weakness in arms or legs:     Sudden numbness in arms or legs:     Sudden onset of difficulty speaking or slurred speech:    Temporary loss of vision in one eye:     Problems with dizziness:         Gastrointestinal    Blood in stool:     Vomited blood:         Genitourinary    Burning when urinating:     Blood in urine:        Psychiatric    Major depression:         Hematologic    Bleeding problems:    Problems with blood clotting too easily:        Skin    Rashes or ulcers:        Constitutional    Fever or chills:      PHYSICAL EXAM: Vitals:   12/25/20 1154 12/25/20 1159  BP: 119/65 130/63  Pulse: 76 77  Resp: 14   Temp: 98 F (36.7 C)   TempSrc: Temporal   SpO2: 98%   Weight: 125 lb (56.7 kg)   Height: 5\' 3"  (1.6 m)     GENERAL: The patient is a  well-nourished female, in no acute distress. The vital signs are documented above. CARDIAC: There is a regular rate and rhythm.  VASCULAR:  Palpable femoral pulse in both groins Palpable pulse in fem fem graft PULMONARY: There is good air exchange bilaterally without wheezing or rales. ABDOMEN: Soft and non-tender. MUSCULOSKELETAL: There are no major deformities or cyanosis. NEUROLOGIC: No focal weakness or paresthesias are detected. SKIN: There are no ulcers or rashes noted.   DATA:   Carotid duplex today shows known right carotid occlusion and now left ICA has progressed from moderate to high-grade stenosis with a velocity of 724/132 greater than 80%.  ABI 0.57 on the right and 0.7 on the left which are stable  Femorofemoral duplex shows greater than 70% stenosis both at the proximal distal anastomosis  Assessment/Plan:   55 year old female presents for follow-up and surveillance of her right iliac stents as well as her right to left femorofemoral bypass and for surveillance of her carotid artery disease.  Seems her most pressing issue today is her carotid artery disease.  She has a known right carotid occlusion after previous right carotid  endarterectomy by Dr. Madilyn Fireman.  Her left ICA has had a moderate stenosis where she has also had a previous CEA by Dr. Madilyn Fireman.  She has now progressed to high-grade stenosis on the left.  I recommended a CTA neck to evaluate for possible TCAR and discussed the steps of surgery with her in detail.  I will have her follow-up with me after CTA neck.  Regards to her femorofemoral bypass this graft  was placed approximately 20 years ago by Dr. Madilyn Fireman for a left iliac occlusion (at age 33).  Been monitoring both inflow and outflow stenosis but she is having no symptoms and still has femoral pulses and a pulse in the bypass.  Her carotids are most pressing at this time.  The fact that her femorofemoral bypass has lasted this long is actually quite  amazing.    Cephus Shelling, MD [facility] Office: [phone]  Cephus Shelling

## 2021-01-11 ENCOUNTER — Encounter: Payer: Self-pay | Admitting: Cardiovascular Disease

## 2021-01-11 ENCOUNTER — Other Ambulatory Visit: Payer: Self-pay

## 2021-01-11 ENCOUNTER — Ambulatory Visit: Payer: Medicare PPO | Admitting: Cardiovascular Disease

## 2021-01-11 ENCOUNTER — Ambulatory Visit
Admission: RE | Admit: 2021-01-11 | Discharge: 2021-01-11 | Disposition: A | Discharge status: Home / Self Care | Payer: Medicare PPO | Source: Ambulatory Visit | Attending: Vascular Surgery | Admitting: Vascular Surgery

## 2021-01-11 VITALS — BP 146/60 | HR 79 | Ht 63.0 in | Wt 128.8 lb

## 2021-01-11 DIAGNOSIS — I10 Essential (primary) hypertension: Secondary | ICD-10-CM

## 2021-01-11 DIAGNOSIS — R011 Cardiac murmur, unspecified: Secondary | ICD-10-CM | POA: Diagnosis not present

## 2021-01-11 DIAGNOSIS — E785 Hyperlipidemia, unspecified: Secondary | ICD-10-CM

## 2021-01-11 DIAGNOSIS — Z79899 Other long term (current) drug therapy: Secondary | ICD-10-CM

## 2021-01-11 DIAGNOSIS — I6523 Occlusion and stenosis of bilateral carotid arteries: Secondary | ICD-10-CM

## 2021-01-11 DIAGNOSIS — R9431 Abnormal electrocardiogram [ECG] [EKG]: Secondary | ICD-10-CM

## 2021-01-11 MED ORDER — REPATHA SURECLICK 140 MG/ML ~~LOC~~ SOAJ: 140.0000 mg | SUBCUTANEOUS | 3 refills | Status: DC

## 2021-01-11 MED ORDER — IOPAMIDOL (ISOVUE-370) INJECTION 76%
75.0000 mL | Freq: Once | INTRAVENOUS | Status: AC | PRN
  Administered 2021-01-11: 75 mL via INTRAVENOUS

## 2021-01-11 NOTE — Progress Notes (Signed)
Cardiology Office Note:    Date:  01/11/2021   ID:  Andrea [name], DOB March 26, 1966, MRN [id number]  PCP:  Dema Severin, NP  [facility] Cardiologist:  Kristeen Miss, MD  [facility] Electrophysiologist:  None   Referring MD: No ref. provider found   Chief Complaint  Patient presents with  . Heart Murmur  . PAD  . Palpitations    Jan. 21, 2022   Problem List 1. PAD 2.  Hyperlipidemia 3. Diabetes Mellitus 4. Carotid artery disease.    Andrea [name] is a 54 y.o. female with a hx of severe PAD .  She has severe Carotid disease  100% RICA S/p left CEA  Lexiscan Myoview study from August, 2020 was low risk.  She had no evidence of ischemia.  She had a overall normal LV systolic function.   Has several femoral PAD  Has a murmur Hx of HLD, is on Simvastatin  Has had difficulty getting Reaptha .   Is retired / disabled .   Was a Emergency planning/management officer,  Also did hair dressing  Some exercise     Past Medical History:  Diagnosis Date  . Allergy   . Anemia   . Anxiety    on Alprazolam  . Arthritis    both hips  . Cancer (HCC)    skin cancer  . Carotid artery occlusion    a. s/p prior endarterectomies with known RICA occlusion - followed by [facility].  . Depression    on Effexor  . Diabetes mellitus with circulatory complication (HCC)    2011  . Diastolic dysfunction without heart failure   . Heart murmur    was seen by cardiologist in [facility] 2013  . History of kidney stones   . Hyperlipidemia   . Hypertension   . Peripheral vascular disease (HCC)   . PVD (peripheral vascular disease) (HCC)    a. s/p prior LE bypass grafting and revision (followed by [facility]).  Marland Kitchen TIA (transient ischemic attack)    a. possible mini stroke in 2015 - was told she did not have full stroke.    Past Surgical History:  Procedure Laterality Date  . 2 stents    . ABDOMINAL AORTOGRAM W/LOWER EXTREMITY N/A 01/20/2019   Procedure: ABDOMINAL AORTOGRAM W/LOWER EXTREMITY;  Surgeon:  Cephus Shelling, MD;  [facility];  Service: Cardiovascular;  Laterality: N/A;  . CAROTID ENDARTERECTOMY    . CESAREAN SECTION    . CHOLECYSTECTOMY    . EXTRACORPOREAL SHOCK WAVE LITHOTRIPSY Left 03/29/2018   Procedure: LEFT EXTRACORPOREAL SHOCK WAVE LITHOTRIPSY (ESWL);  Surgeon: Marcine Matar, MD;  Location: [facility] ORS;  Service: Urology;  Laterality: Left;  . FEMORAL BYPASS     x 2 on right side due to re-stenosis  . left/right carotid artery     2003 and 2009 for right, right side is 100% occluded per patient; left side 2009  . TUBAL LIGATION      Current Medications: Current Meds  Medication Sig  . amLODipine (NORVASC) 10 MG tablet Take 1 tablet (10 mg total) by mouth daily.  Marland Kitchen aspirin 81 MG tablet Take 1 tablet (81 mg total) by mouth daily.  . cetirizine (ZYRTEC) 10 MG tablet Take 10 mg by mouth as needed for allergies.  Marland Kitchen clopidogrel (PLAVIX) 75 MG tablet Take 1 tablet (75 mg total) by mouth daily.  . fluticasone (FLONASE) 50 MCG/ACT nasal spray Place 2 sprays into both nostrils as needed for allergies or rhinitis.  Marland Kitchen  ibuprofen (ADVIL,MOTRIN) 200 MG tablet Take 400 mg by mouth every 8 (eight) hours as needed (pain.).  Marland Kitchen Lancets MISC Test blood sugar once daily.  Marland Kitchen lisinopril (PRINIVIL,ZESTRIL) 2.5 MG tablet Take 1 tablet (2.5 mg total) by mouth daily.  . metFORMIN (GLUCOPHAGE) 1000 MG tablet Take 1 tablet (1,000 mg total) by mouth 2 (two) times daily with a meal.  . naproxen sodium (ALEVE) 220 MG tablet Take 440 mg by mouth 2 (two) times daily as needed (pain.).  Marland Kitchen simvastatin (ZOCOR) 20 MG tablet Take 1 tablet (20 mg total) by mouth at bedtime.  Marland Kitchen venlafaxine XR (EFFEXOR-XR) 150 MG 24 hr capsule Take 150 mg by mouth daily with breakfast.  . [DISCONTINUED] diphenhydrAMINE (BENADRYL) 50 MG capsule Take 50 mg by mouth 1 hour prior to your procedure.  . [DISCONTINUED] Evolocumab (REPATHA SURECLICK) 161 MG/ML SOAJ Inject 140 mg into the skin every 14 (fourteen)  days.  . [DISCONTINUED] predniSONE (DELTASONE) 50 MG tablet One tablet (50mg ) 13 hours prior to procedure; one tablet (50mg ) 7 hours prior to procedure and then one tablet (50 mg) one hour prior to procedure.     Allergies:   Atorvastatin, Iohexol, Other, and Rosuvastatin   Social History   Socioeconomic History  . Marital status: Married    Spouse name: Not on file  . Number of children: 1  . Years of education: College  . Highest education level: Not on file  Occupational History  . Occupation: retired/disabled    Employer: UNEMPLOYED  Tobacco Use  . Smoking status: Current Every Day Smoker    Packs/day: 1.00    Years: 40.00    Pack years: 40.00    Types: Cigarettes  . Smokeless tobacco: Never Used  Vaping Use  . Vaping Use: Never used  Substance and Sexual Activity  . Alcohol use: Yes    Alcohol/week: 0.0 standard drinks    Comment: holidays/special occas 3-4 drinks  . Drug use: No  . Sexual activity: Not Currently    Partners: Male  Other Topics Concern  . Not on file  Social History Narrative   Pt is from [facility]   Patient lives at home with her husband and son   Patient is right handed   Patient drinks coffee daily   Social Determinants of Health   Financial Resource Strain: Not on file  Food Insecurity: Not on file  Transportation Needs: Not on file  Physical Activity: Not on file  Stress: Not on file  Social Connections: Not on file     Family History: The patient's family history includes Cancer in her paternal grandmother and sister; Diabetes in her maternal grandmother and mother; Heart attack in her brother; Heart disease in her father, maternal grandmother, and mother; Hyperlipidemia in her father, maternal grandmother, and mother; Hypertension in her father and mother; Varicose Veins in her sister.  ROS:   Please see the history of present illness.     All other systems reviewed and are negative.  EKGs/Labs/Other Studies Reviewed:     The following studies were reviewed today:   EKG:  Jan. 21, 2022:   Normal sinus rhythm at 79.  ST and T wave depressions.  No changes from previous EKGs.  Recent Labs: No results found for requested labs within last 8760 hours.  Recent Lipid Panel    Component Value Date/Time   CHOL 188 08/10/2019 1626   TRIG 237 (H) 08/10/2019 1626   HDL 32 (L) 08/10/2019 1626   CHOLHDL 5.9 (H)  08/10/2019 1626   CHOLHDL 6.7 (H) 08/13/2015 1057   VLDL 62 (H) 08/13/2015 1057   LDLCALC 109 (H) 08/10/2019 1626     Risk Assessment/Calculations:      Physical Exam:    VS:  BP (!) 146/60   Pulse 79   Ht 5\' 3"  (1.6 m)   Wt 128 lb 12.8 oz (58.4 kg)   SpO2 97%   BMI 22.82 kg/m     Wt Readings from Last 3 Encounters:  01/11/21 128 lb 12.8 oz (58.4 kg)  12/25/20 125 lb (56.7 kg)  05/15/20 124 lb (56.2 kg)     GEN:  Middle age female ,  Appears older than stated age.  HEENT: Normal NECK: No JVD;  B CEA scars,   Loud left carotid bruit  LYMPHATICS: No lymphadenopathy CARDIAC: RRR, soft systolic murmur  RESPIRATORY:  Clear to auscultation without rales, wheezing or rhonchi  ABDOMEN: Soft, non-tender, non-distended MUSCULOSKELETAL:  No edema; No deformity  SKIN: Warm and dry NEUROLOGIC:  Alert and oriented x 3 PSYCHIATRIC:  Normal affect   ASSESSMENT:    1. Essential hypertension   2. Nonspecific abnormal electrocardiogram (ECG) (EKG)   3. Murmur, cardiac   4. Hyperlipidemia, unspecified hyperlipidemia type   5. Medication management    PLAN:    In order of problems listed above:  1. Systolic murmur: Patient has a systolic murmur.  Her last echocardiogram was in 2015.  She did not have any significant valvular abnormalities at that time.  We will repeat her echocardiogram.  2.  Peripheral arterial disease: She has severe PAD.  She is had both carotids fixed.  She now has a chronic total occlusion on her right side.  She has subclavian bruits.  She has an abdominal bruit.  She  has a history of femorofemoral bypass.  I have strongly advised her to stop smoking.  We will get her back on the Repatha.  We will check lipids, liver enzymes, basic metabolic profile in 3 months.   Medication Adjustments/Labs and Tests Ordered: Current medicines are reviewed at length with the patient today.  Concerns regarding medicines are outlined above.  Orders Placed This Encounter  Procedures  . Basic metabolic panel  . Lipid panel  . Hepatic function panel  . EKG 12-Lead  . ECHOCARDIOGRAM COMPLETE   Meds ordered this encounter  Medications  . Evolocumab (REPATHA SURECLICK) 601 MG/ML SOAJ    Sig: Inject 140 mg into the skin every 14 (fourteen) days.    Dispense:  1 mL    Refill:  3    6 pens for 90 day supply     Patient Instructions  Medication Instructions:  The current medical regimen is effective;  continue present plan and medications.  *If you need a refill on your cardiac medications before your next appointment, please call your pharmacy*  Lab Work: Please return in 3 months for blood work (Lipid/ liver and BMP)  If you have labs (blood work) drawn today and your tests are completely normal, you will receive your results only by: Marland Kitchen MyChart Message (if you have MyChart) OR . A paper copy in the mail If you have any lab test that is abnormal or we need to change your treatment, we will call you to review the results.  Testing/Procedures: Your physician has requested that you have an echocardiogram. Echocardiography is a painless test that uses sound waves to create images of your heart. It provides your doctor with information about the size and  shape of your heart and how well your heart's chambers and valves are working. This procedure takes approximately one hour. There are no restrictions for this procedure.  Follow-Up: At [facility], you and your health needs are our priority.  As part of our continuing mission to provide you with exceptional heart  care, we have created designated Provider Care Teams.  These Care Teams include your primary Cardiologist (physician) and Advanced Practice Providers (APPs -  Physician Assistants and Nurse Practitioners) who all work together to provide you with the care you need, when you need it.  We recommend signing up for the patient portal called "MyChart".  Sign up information is provided on this After Visit Summary.  MyChart is used to connect with patients for Virtual Visits (Telemedicine).  Patients are able to view lab/test results, encounter notes, upcoming appointments, etc.  Non-urgent messages can be sent to your provider as well.   To learn more about what you can do with MyChart, go to [email or web address].    Your next appointment:   12 month(s)  The format for your next appointment:   In Person  Provider:   Kristeen Miss, MD  Thank you for choosing [facility]!!         Signed, Kristeen Miss, MD  01/11/2021 5:02 PM    [facility]

## 2021-01-11 NOTE — Patient Instructions (Signed)
Medication Instructions:  The current medical regimen is effective;  continue present plan and medications.  *If you need a refill on your cardiac medications before your next appointment, please call your pharmacy*  Lab Work: Please return in 3 months for blood work (Lipid/ liver and BMP)  If you have labs (blood work) drawn today and your tests are completely normal, you will receive your results only by: Marland Kitchen MyChart Message (if you have MyChart) OR . A paper copy in the mail If you have any lab test that is abnormal or we need to change your treatment, we will call you to review the results.  Testing/Procedures: Your physician has requested that you have an echocardiogram. Echocardiography is a painless test that uses sound waves to create images of your heart. It provides your doctor with information about the size and shape of your heart and how well your heart's chambers and valves are working. This procedure takes approximately one hour. There are no restrictions for this procedure.  Follow-Up: At [facility], you and your health needs are our priority.  As part of our continuing mission to provide you with exceptional heart care, we have created designated Provider Care Teams.  These Care Teams include your primary Cardiologist (physician) and Advanced Practice Providers (APPs -  Physician Assistants and Nurse Practitioners) who all work together to provide you with the care you need, when you need it.  We recommend signing up for the patient portal called "MyChart".  Sign up information is provided on this After Visit Summary.  MyChart is used to connect with patients for Virtual Visits (Telemedicine).  Patients are able to view lab/test results, encounter notes, upcoming appointments, etc.  Non-urgent messages can be sent to your provider as well.   To learn more about what you can do with MyChart, go to [email or web address].    Your next appointment:   12 month(s)  The format  for your next appointment:   In Person  Provider:   Mertie Moores, MD  Thank you for choosing [facility]!!

## 2021-01-22 ENCOUNTER — Encounter: Payer: Self-pay | Admitting: Vascular Surgery

## 2021-01-22 ENCOUNTER — Other Ambulatory Visit: Payer: Self-pay

## 2021-01-22 ENCOUNTER — Ambulatory Visit: Payer: Medicare PPO | Admitting: Vascular Surgery

## 2021-01-22 VITALS — BP 112/69 | HR 70 | Temp 98.3°F | Resp 14 | Ht 63.0 in | Wt 128.0 lb

## 2021-01-22 DIAGNOSIS — I6523 Occlusion and stenosis of bilateral carotid arteries: Secondary | ICD-10-CM

## 2021-01-22 NOTE — Progress Notes (Signed)
Patient name: Andrea [name] MRN: [id number] DOB: 1966-11-21 Sex: female  REASON FOR CONSULT: F/U after CTA neck to discuss possible left TCAR  HPI: Andrea [name] is a 55 y.o. female, with known severe peripheral vascular disease and carotid disease that presents to discuss CTA neck and evaluation of TCAR for high grade left ICA stenosis identified on recent US.   She has been under surveillance of right to left femoral-femoral bypass and carotid arteries.  Patient initially underwent right to left femorofemoral bypass by Dr. Amedeo Plenty approximately 17 years ago (in 2003) - using 7 mm Dacron.  She had to have a revision later with bilateral femoral patch angioplasty on 01/25/04. She has also undergone a right common iliac artery stent here by Dr. Amedeo Plenty and another iliac stent at [facility].   She has known inflow and outflow disease.  In regards to her carotid artery disease she has had a history of bilateral carotid endarterectomies.  Her right carotid was done in 2013 by Dr. Amedeo Plenty and her left carotid was done in 2009 also by Dr. Amedeo Plenty.  Her right side is known to be occluded.  She is still smoking a pack a day.  She is on aspirin Plavix statin.  She does report a TIA in 2015.  Past Medical History:  Diagnosis Date  . Allergy   . Anemia   . Anxiety    on Alprazolam  . Arthritis    both hips  . Cancer ([place])    skin cancer  . Carotid artery occlusion    a. s/p prior endarterectomies with known RICA occlusion - followed by [facility].  . Depression    on Effexor  . Diabetes mellitus with circulatory complication ([place])    [id number]  . Diastolic dysfunction without heart failure   . Heart murmur    was seen by cardiologist in [facility] 2013  . History of kidney stones   . Hyperlipidemia   . Hypertension   . Peripheral vascular disease ([place])   . PVD (peripheral vascular disease) ([place])    a. s/p prior LE bypass grafting and revision (followed by [facility]).  Marland Kitchen TIA (transient ischemic attack)    a.  possible mini stroke in 2015 - was told she did not have full stroke.    Past Surgical History:  Procedure Laterality Date  . 2 stents    . ABDOMINAL AORTOGRAM W/LOWER EXTREMITY N/A 01/20/2019   Procedure: ABDOMINAL AORTOGRAM W/LOWER EXTREMITY;  Surgeon: Marty Heck, MD;  Location: [place] CV LAB;  Service: Cardiovascular;  Laterality: N/A;  . CAROTID ENDARTERECTOMY    . CESAREAN SECTION    . CHOLECYSTECTOMY    . EXTRACORPOREAL SHOCK WAVE LITHOTRIPSY Left 03/29/2018   Procedure: LEFT EXTRACORPOREAL SHOCK WAVE LITHOTRIPSY (ESWL);  Surgeon: Franchot Gallo, MD;  Location: [facility];  Service: Urology;  Laterality: Left;  . FEMORAL BYPASS     x 2 on right side due to re-stenosis  . left/right carotid artery     2003 and 2009 for right, right side is 100% occluded per patient; left side 2009  . TUBAL LIGATION      Family History  Problem Relation Age of Onset  . Diabetes Mother   . Heart disease Mother   . Hyperlipidemia Mother   . Hypertension Mother   . Heart disease Father        MI, unknown age  . Hyperlipidemia Father   . Hypertension Father   . Diabetes Maternal Grandmother   .  Heart disease Maternal Grandmother   . Hyperlipidemia Maternal Grandmother   . Cancer Paternal Grandmother   . Cancer Sister   . Varicose Veins Sister   . Heart attack Brother        Age 4    SOCIAL HISTORY: Social History   Socioeconomic History  . Marital status: Married    Spouse name: Not on file  . Number of children: 1  . Years of education: College  . Highest education level: Not on file  Occupational History  . Occupation: retired/disabled    Employer: UNEMPLOYED  Tobacco Use  . Smoking status: Current Every Day Smoker    Packs/day: 1.00    Years: 40.00    Pack years: 40.00    Types: Cigarettes  . Smokeless tobacco: Never Used  Vaping Use  . Vaping Use: Never used  Substance and Sexual Activity  . Alcohol use: Yes    Alcohol/week: 0.0 standard drinks     Comment: holidays/special occas 3-4 drinks  . Drug use: No  . Sexual activity: Not Currently    Partners: Male  Other Topics Concern  . Not on file  Social History Narrative   Pt is from [facility]   Patient lives at home with her husband and son   Patient is right handed   Patient drinks coffee daily   Social Determinants of Health   Financial Resource Strain: Not on file  Food Insecurity: Not on file  Transportation Needs: Not on file  Physical Activity: Not on file  Stress: Not on file  Social Connections: Not on file  Intimate Partner Violence: Not on file    Allergies  Allergen Reactions  . Atorvastatin     Muscle cramps  . Iohexol Hives    Patient should take 13 hours prep   . Other Hives    Contrast dye   . Rosuvastatin Nausea And Vomiting    Nausea/vomiting    Current Outpatient Medications  Medication Sig Dispense Refill  . amLODipine (NORVASC) 10 MG tablet Take 1 tablet (10 mg total) by mouth daily. 90 tablet 3  . aspirin 81 MG tablet Take 1 tablet (81 mg total) by mouth daily. 30 tablet 11  . cetirizine (ZYRTEC) 10 MG tablet Take 10 mg by mouth as needed for allergies.    Marland Kitchen clopidogrel (PLAVIX) 75 MG tablet Take 1 tablet (75 mg total) by mouth daily. 90 tablet 3  . Evolocumab (REPATHA SURECLICK) 578 MG/ML SOAJ Inject 140 mg into the skin every 14 (fourteen) days. 1 mL 3  . fluticasone (FLONASE) 50 MCG/ACT nasal spray Place 2 sprays into both nostrils as needed for allergies or rhinitis.    Marland Kitchen ibuprofen (ADVIL,MOTRIN) 200 MG tablet Take 400 mg by mouth every 8 (eight) hours as needed (pain.).    Marland Kitchen Lancets MISC Test blood sugar once daily. 100 each 2  . lisinopril (PRINIVIL,ZESTRIL) 2.5 MG tablet Take 1 tablet (2.5 mg total) by mouth daily. 90 tablet 3  . metFORMIN (GLUCOPHAGE) 1000 MG tablet Take 1 tablet (1,000 mg total) by mouth 2 (two) times daily with a meal. 180 tablet 3  . naproxen sodium (ALEVE) 220 MG tablet Take 440 mg by mouth 2 (two) times  daily as needed (pain.).    Marland Kitchen simvastatin (ZOCOR) 20 MG tablet Take 1 tablet (20 mg total) by mouth at bedtime. 90 tablet 3  . venlafaxine XR (EFFEXOR-XR) 150 MG 24 hr capsule Take 150 mg by mouth daily with breakfast.  No current facility-administered medications for this visit.    REVIEW OF SYSTEMS:  [X]  denotes positive finding, [ ]  denotes negative finding Cardiac  Comments:  Chest pain or chest pressure:    Shortness of breath upon exertion:    Short of breath when lying flat:    Irregular heart rhythm:        Vascular    Pain in calf, thigh, or hip brought on by ambulation:    Pain in feet at night that wakes you up from your sleep:     Blood clot in your veins:    Leg swelling:         Pulmonary    Oxygen at home:    Productive cough:     Wheezing:         Neurologic    Sudden weakness in arms or legs:     Sudden numbness in arms or legs:     Sudden onset of difficulty speaking or slurred speech:    Temporary loss of vision in one eye:     Problems with dizziness:         Gastrointestinal    Blood in stool:     Vomited blood:         Genitourinary    Burning when urinating:     Blood in urine:        Psychiatric    Major depression:         Hematologic    Bleeding problems:    Problems with blood clotting too easily:        Skin    Rashes or ulcers:        Constitutional    Fever or chills:      PHYSICAL EXAM: Vitals:   01/22/21 0924 01/22/21 0927  BP: 121/72 112/69  Pulse: 69 70  Resp: 14   Temp: 98.3 F (36.8 C)   TempSrc: Temporal   SpO2: 98%   Weight: 128 lb (58.1 kg)   Height: 5\' 3"  (1.6 m)     GENERAL: The patient is a well-nourished female, in no acute distress. The vital signs are documented above. CARDIAC: There is a regular rate and rhythm.  VASCULAR:  Bilateral carotid neck incisions PULMONARY: No respiratory distress. ABDOMEN: Soft and non-tender. MUSCULOSKELETAL: There are no major deformities or cyanosis. NEUROLOGIC:  No focal weakness or paresthesias are detected.    DATA:   Carotid duplex in January 2022 showed a known right carotid occlusion and left ICA high-grade stenosis with a velocity of 724/132 greater than 80%.  CTA neck on my review shows a known right common and ICA occlusion.  The proximal left common carotid is limited in evaluation due to contrast bolus but I see no significant flow-limiting stenosis in the left common carotid, bifurcation or left ICA.  Assessment/Plan:   55 year old female presents to discuss possible left TCAR based on recent carotid ultrasound last month that showed a high-grade greater than 80% stenosis in the left ICA and she has previously had a left carotid endarterectomy years ago by Dr. Amedeo Plenty.  I reviewed her CTA neck scan with her today and the CT shows no evidence of high-grade stenosis to correlate her ultrasound.  I do not think she needs an left TCAR at this time and this is asymptomatic recurrent carotid disease and we discussed current guidelines are greater than 80% to warrant any surgery.  I will see her again in 6 months with carotid ultrasound here in the office  although this may be difficult to follow given her most recent ultrasound findings.  We will also get femorofemoral duplex and ABIs.  She does have some stenoses in both the inflow and outflow of her femorofemoral bypass but again this is now 55 years old and has had multiple revisions prior to me taking care of her.   Marty Heck, MD [facility] [place] Office: [place]

## 2021-01-23 ENCOUNTER — Other Ambulatory Visit: Payer: Self-pay

## 2021-01-23 DIAGNOSIS — I739 Peripheral vascular disease, unspecified: Secondary | ICD-10-CM

## 2021-01-23 DIAGNOSIS — I6523 Occlusion and stenosis of bilateral carotid arteries: Secondary | ICD-10-CM

## 2021-01-30 ENCOUNTER — Other Ambulatory Visit: Payer: Self-pay

## 2021-01-30 ENCOUNTER — Ambulatory Visit (HOSPITAL_COMMUNITY): Payer: Medicare PPO | Attending: Cardiology

## 2021-01-30 DIAGNOSIS — R011 Cardiac murmur, unspecified: Secondary | ICD-10-CM | POA: Diagnosis present

## 2021-01-30 LAB — ECHOCARDIOGRAM COMPLETE
AR max vel: 1.46 cm2
AV Area VTI: 1.53 cm2
AV Area mean vel: 1.48 cm2
AV Mean grad: 8.7 mmHg
AV Peak grad: 19 mmHg
Ao pk vel: 2.18 m/s
Area-P 1/2: 3.21 cm2
S' Lateral: 2.8 cm

## 2021-02-05 ENCOUNTER — Encounter: Payer: Self-pay | Admitting: *Deleted

## 2021-02-14 ENCOUNTER — Telehealth: Payer: Self-pay | Admitting: Internal Medicine

## 2021-02-14 NOTE — Telephone Encounter (Signed)
Informed patient of results and verbal understanding expressed.  

## 2021-02-14 NOTE — Telephone Encounter (Signed)
Patient is returning call to discuss echo results. °

## 2021-04-11 ENCOUNTER — Other Ambulatory Visit: Payer: Self-pay

## 2021-04-11 ENCOUNTER — Other Ambulatory Visit: Payer: Medicare PPO

## 2021-04-11 DIAGNOSIS — Z79899 Other long term (current) drug therapy: Secondary | ICD-10-CM

## 2021-04-11 DIAGNOSIS — E785 Hyperlipidemia, unspecified: Secondary | ICD-10-CM

## 2021-04-11 DIAGNOSIS — I10 Essential (primary) hypertension: Secondary | ICD-10-CM

## 2021-04-11 LAB — BASIC METABOLIC PANEL
BUN/Creatinine Ratio: 16 (ref 9–23)
BUN: 11 mg/dL (ref 6–24)
CO2: 25 mmol/L (ref 20–29)
Calcium: 9.9 mg/dL (ref 8.7–10.2)
Chloride: 100 mmol/L (ref 96–106)
Creatinine, Ser: 0.67 mg/dL (ref 0.57–1.00)
Glucose: 151 mg/dL — ABNORMAL HIGH (ref 65–99)
Potassium: 4.6 mmol/L (ref 3.5–5.2)
Sodium: 140 mmol/L (ref 134–144)
eGFR: 102 mL/min/{1.73_m2} (ref >59)

## 2021-04-11 LAB — LIPID PANEL
Chol/HDL Ratio: 2.6 ratio (ref 0.0–4.4)
Cholesterol, Total: 108 mg/dL (ref 100–199)
HDL: 41 mg/dL (ref >39)
LDL Chol Calc (NIH): 39 mg/dL (ref 0–99)
Triglycerides: 171 mg/dL — ABNORMAL HIGH (ref 0–149)
VLDL Cholesterol Cal: 28 mg/dL (ref 5–40)

## 2021-04-11 LAB — HEPATIC FUNCTION PANEL
ALT: 9 IU/L (ref 0–32)
AST: 15 IU/L (ref 0–40)
Albumin: 4.4 g/dL (ref 3.8–4.9)
Alkaline Phosphatase: 109 IU/L (ref 44–121)
Bilirubin Total: 0.3 mg/dL (ref 0.0–1.2)
Bilirubin, Direct: 0.1 mg/dL (ref 0.00–0.40)
Total Protein: 7.4 g/dL (ref 6.0–8.5)

## 2021-06-06 ENCOUNTER — Encounter: Payer: Self-pay | Admitting: Gastroenterology

## 2021-07-23 ENCOUNTER — Ambulatory Visit (HOSPITAL_COMMUNITY)
Admission: RE | Admit: 2021-07-23 | Discharge: 2021-07-23 | Disposition: A | Discharge status: Home / Self Care | Payer: Medicare PPO | Source: Ambulatory Visit | Attending: Vascular Surgery | Admitting: Vascular Surgery

## 2021-07-23 ENCOUNTER — Ambulatory Visit: Payer: Medicare PPO | Admitting: Vascular Surgery

## 2021-07-23 ENCOUNTER — Ambulatory Visit (INDEPENDENT_AMBULATORY_CARE_PROVIDER_SITE_OTHER)
Admission: RE | Admit: 2021-07-23 | Discharge: 2021-07-23 | Disposition: A | Discharge status: Home / Self Care | Payer: Medicare PPO | Source: Ambulatory Visit | Attending: Vascular Surgery | Admitting: Vascular Surgery

## 2021-07-23 ENCOUNTER — Other Ambulatory Visit: Payer: Self-pay

## 2021-07-23 VITALS — BP 159/67 | HR 65 | Temp 97.4°F | Resp 14 | Ht 63.0 in | Wt 125.0 lb

## 2021-07-23 DIAGNOSIS — I739 Peripheral vascular disease, unspecified: Secondary | ICD-10-CM

## 2021-07-23 DIAGNOSIS — I6523 Occlusion and stenosis of bilateral carotid arteries: Secondary | ICD-10-CM

## 2021-07-23 NOTE — Progress Notes (Signed)
Patient name: Andrea [name] MRN: [id number] DOB: [birth year] Sex: female  REASON FOR CONSULT: Follow-up of carotid artery disease and peripheral arterial disease  HPI: Andrea [name] is a 55 y.o. female, with known severe peripheral vascular disease and carotid disease that presents for continued interval surveillance.  Patient initially underwent right to left femorofemoral bypass by Dr. Amedeo Plenty approximately 18 years ago (in 2003) - using 7 mm Dacron.  She had to have a revision later with bilateral femoral patch angioplasty on 01/25/04. She has also undergone a right common iliac artery stent here by Dr. Amedeo Plenty and another iliac stent at [facility]   She has known inflow and outflow disease.  In regards to her carotid artery disease she has had a history of bilateral carotid endarterectomies.  Her right carotid was done in 2013 by Dr. Amedeo Plenty and her left carotid was done in 2009 also by Dr. Amedeo Plenty.  Her right side is known to be occluded.  She is still smoking a pack a day.  She is on aspirin Plavix statin.  She does report a TIA in 2015.  On follow-up today her main concern is bilateral calf claudication that is a very short distance that started over the last 3 months.  Both legs are affected equally.  States she can hardly walk to her car.  No rest pain or wounds.  Past Medical History:  Diagnosis Date   Allergy    Anemia    Anxiety    on Alprazolam   Arthritis    both hips   Cancer ([place])    skin cancer   Carotid artery occlusion    a. s/p prior endarterectomies with known RICA occlusion - followed by [facility].   Depression    on Effexor   Diabetes mellitus with circulatory complication ([place])    [id number]   Diastolic dysfunction without heart failure    Heart murmur    was seen by cardiologist in [facility] 2013   History of kidney stones    Hyperlipidemia    Hypertension    Peripheral vascular disease ([place])    PVD (peripheral vascular disease) ([place])    a. s/p prior LE bypass  grafting and revision (followed by [facility]).   TIA (transient ischemic attack)    a. possible mini stroke in 2015 - was told she did not have full stroke.    Past Surgical History:  Procedure Laterality Date   2 stents     ABDOMINAL AORTOGRAM W/LOWER EXTREMITY N/A 01/20/2019   Procedure: ABDOMINAL AORTOGRAM W/LOWER EXTREMITY;  Surgeon: Marty Heck, MD;  Location: [facility];  Service: Cardiovascular;  Laterality: N/A;   CAROTID ENDARTERECTOMY     CESAREAN SECTION     CHOLECYSTECTOMY     EXTRACORPOREAL SHOCK WAVE LITHOTRIPSY Left 03/29/2018   Procedure: LEFT EXTRACORPOREAL SHOCK WAVE LITHOTRIPSY (ESWL);  Surgeon: Franchot Gallo, MD;  Location: [facility];  Service: Urology;  Laterality: Left;   FEMORAL BYPASS     x 2 on right side due to re-stenosis   left/right carotid artery     2003 and 2009 for right, right side is 100% occluded per patient; left side 2009   TUBAL LIGATION      Family History  Problem Relation Age of Onset   Diabetes Mother    Heart disease Mother    Hyperlipidemia Mother    Hypertension Mother    Heart disease Father        MI, unknown age  Hyperlipidemia Father    Hypertension Father    Diabetes Maternal Grandmother    Heart disease Maternal Grandmother    Hyperlipidemia Maternal Grandmother    Cancer Paternal Grandmother    Cancer Sister    Varicose Veins Sister    Heart attack Brother        Age 99    SOCIAL HISTORY: Social History   Socioeconomic History   Marital status: Married    Spouse name: Not on file   Number of children: 1   Years of education: College   Highest education level: Not on file  Occupational History   Occupation: retired/disabled    Fish farm manager: UNEMPLOYED  Tobacco Use   Smoking status: Every Day    Packs/day: 1.00    Years: 40.00    Pack years: 40.00    Types: Cigarettes   Smokeless tobacco: Never  Vaping Use   Vaping Use: Never used  Substance and Sexual Activity   Alcohol use: Yes     Alcohol/week: 0.0 standard drinks    Comment: holidays/special occas 3-4 drinks   Drug use: No   Sexual activity: Not Currently    Partners: Male  Other Topics Concern   Not on file  Social History Narrative   Pt is from [facility]   Patient lives at home with her husband and son   Patient is right handed   Patient drinks coffee daily   Social Determinants of Health   Financial Resource Strain: Not on file  Food Insecurity: Not on file  Transportation Needs: Not on file  Physical Activity: Not on file  Stress: Not on file  Social Connections: Not on file  Intimate Partner Violence: Not on file    Allergies  Allergen Reactions   Atorvastatin     Muscle cramps   Iohexol Hives    Patient should take 13 hours prep    Other Hives    Contrast dye    Rosuvastatin Nausea And Vomiting    Nausea/vomiting    Current Outpatient Medications  Medication Sig Dispense Refill   amLODipine (NORVASC) 10 MG tablet Take 1 tablet (10 mg total) by mouth daily. 90 tablet 3   aspirin 81 MG tablet Take 1 tablet (81 mg total) by mouth daily. 30 tablet 11   cetirizine (ZYRTEC) 10 MG tablet Take 10 mg by mouth as needed for allergies.     clopidogrel (PLAVIX) 75 MG tablet Take 1 tablet (75 mg total) by mouth daily. 90 tablet 3   Evolocumab (REPATHA SURECLICK) XX123456 MG/ML SOAJ Inject 140 mg into the skin every 14 (fourteen) days. 1 mL 3   fluticasone (FLONASE) 50 MCG/ACT nasal spray Place 2 sprays into both nostrils as needed for allergies or rhinitis.     ibuprofen (ADVIL,MOTRIN) 200 MG tablet Take 400 mg by mouth every 8 (eight) hours as needed (pain.).     Lancets MISC Test blood sugar once daily. 100 each 2   lisinopril (PRINIVIL,ZESTRIL) 2.5 MG tablet Take 1 tablet (2.5 mg total) by mouth daily. 90 tablet 3   metFORMIN (GLUCOPHAGE) 1000 MG tablet Take 1 tablet (1,000 mg total) by mouth 2 (two) times daily with a meal. 180 tablet 3   naproxen sodium (ALEVE) 220 MG tablet Take 440 mg by  mouth 2 (two) times daily as needed (pain.).     simvastatin (ZOCOR) 20 MG tablet Take 1 tablet (20 mg total) by mouth at bedtime. 90 tablet 3   venlafaxine XR (EFFEXOR-XR) 150 MG 24 hr  capsule Take 150 mg by mouth daily with breakfast.     No current facility-administered medications for this visit.    REVIEW OF SYSTEMS:  '[X]'$  denotes positive finding, '[ ]'$  denotes negative finding Cardiac  Comments:  Chest pain or chest pressure:    Shortness of breath upon exertion:    Short of breath when lying flat:    Irregular heart rhythm:        Vascular    Pain in calf, thigh, or hip brought on by ambulation:    Pain in feet at night that wakes you up from your sleep:     Blood clot in your veins:    Leg swelling:         Pulmonary    Oxygen at home:    Productive cough:     Wheezing:         Neurologic    Sudden weakness in arms or legs:     Sudden numbness in arms or legs:     Sudden onset of difficulty speaking or slurred speech:    Temporary loss of vision in one eye:     Problems with dizziness:         Gastrointestinal    Blood in stool:     Vomited blood:         Genitourinary    Burning when urinating:     Blood in urine:        Psychiatric    Major depression:         Hematologic    Bleeding problems:    Problems with blood clotting too easily:        Skin    Rashes or ulcers:        Constitutional    Fever or chills:      PHYSICAL EXAM: Vitals:   07/23/21 1228 07/23/21 1231  BP: (!) 144/73 (!) 159/67  Pulse: 68 65  Resp: 14   Temp: (!) 97.4 F (36.3 C)   TempSrc: Temporal   SpO2: 98%   Weight: 125 lb (56.7 kg)   Height: '5\' 3"'$  (1.6 m)     GENERAL: The patient is a well-nourished female, in no acute distress. The vital signs are documented above. CARDIAC: There is a regular rate and rhythm.  VASCULAR:  Palpable pulse in fem fem bypass graft Both femoral pulses palpable Bilateral carotid neck incisions PULMONARY: No respiratory  distress. ABDOMEN: Soft and non-tender. MUSCULOSKELETAL: There are no major deformities or cyanosis. NEUROLOGIC: No focal weakness or paresthesias are detected.    DATA:   Previous CTA neck 01/11/21 on my review shows a known right common and ICA occlusion.  The proximal left common carotid is limited in evaluation due to contrast bolus but I see no significant flow-limiting stenosis in the left common carotid, bifurcation or left ICA.  Carotid duplex today shows known right common and internal carotid occlusion with less than 80% left ICA stenosis.  Femorofemoral duplex shows a moderate right groin stenosis and a high-grade left groin stenosis at the outflow of the fem fem bypass.  Assessment/Plan:   55 year old female presents for interval follow-up of her carotid artery disease and peripheral arterial disease.  Her main complaint today seems to be new onset short distance bilateral lower extremity calf claudication.  This is in the setting of known right to left femorofemoral bypass with previous right iliac stenting for inflow disease of the femorofemoral bypass.  I reviewed her old CT imaging and she has  some distal aortic disease as well.  I have recommended a CTA abdomen pelvis with runoff to evaluate whether or not this could be a distal aortic problem versus an iliac stent problem versus a problem with the femorofemoral itself.  I will follow-up with her after CTA.  Her carotid disease remains asymptomatic.  She has a known right carotid occlusion.  The left carotid is less than 80% and I think these velocities are still falsely elevated given contralateral occlusion as noted on CTA from earlier this year when we were debating TCAR on the left.  Marty Heck, MD [facility] Office: [place]

## 2021-08-05 ENCOUNTER — Other Ambulatory Visit: Payer: Self-pay

## 2021-08-05 DIAGNOSIS — I739 Peripheral vascular disease, unspecified: Secondary | ICD-10-CM

## 2021-08-05 MED ORDER — PREDNISONE 50 MG PO TABS: ORAL_TABLET | ORAL | 0 refills | Status: DC

## 2021-08-05 MED ORDER — DIPHENHYDRAMINE HCL 50 MG PO CAPS: ORAL_CAPSULE | ORAL | 0 refills | Status: DC

## 2021-08-07 ENCOUNTER — Telehealth: Payer: Self-pay

## 2021-08-07 MED ORDER — PREDNISONE 50 MG PO TABS: ORAL_TABLET | ORAL | 0 refills | Status: DC

## 2021-08-07 NOTE — Telephone Encounter (Signed)
Phone call to patient to review instructions for 13 hr prep for CT ANGIO w/ contrast on 08/23/21 AT 1100AM.  Prescription called into [facility]. Pt aware and verbalized understanding of instructions.  Pt to take 50 mg of prednisone on 08/22/21 at 10:00PM, 50 mg of prednisone on 08/23/21 at 0400 AM, and 50 mg of prednisone on 08/23/21 at 10:00AM. Pt is also to take 50 mg of benadryl on 08/23/21 at 10:00.  Patient does not wish to have Benadryl called in as a prescription, plans to take home medication.   Please call [phone] with any questions.

## 2021-08-09 ENCOUNTER — Telehealth: Payer: Self-pay

## 2021-08-09 DIAGNOSIS — E785 Hyperlipidemia, unspecified: Secondary | ICD-10-CM

## 2021-08-09 NOTE — Telephone Encounter (Signed)
I returned a call to the pt stated that we had received a call from pcp stating she needed refill of repatha but I need the pt to instruct me on where to send it. Will await call back

## 2021-08-09 NOTE — Telephone Encounter (Signed)
This encounter was created in error - please disregard.

## 2021-08-13 MED ORDER — REPATHA SURECLICK 140 MG/ML ~~LOC~~ SOAJ: 140.0000 mg | SUBCUTANEOUS | 3 refills | Status: DC

## 2021-08-13 NOTE — Telephone Encounter (Signed)
Her pa is still active until 12/21/21 no need for a new pa currenlty but we will need new lab work prior to renewal  12/21/21 will route back to megan supple to ask for permission to order lipid panel. At some point she had stopped taking it yes

## 2021-08-13 NOTE — Telephone Encounter (Addendum)
That is fine to recheck a lipid panel, if she was taking Repatha back in March she doesn't need her labs rechecked soon. Would have her schedule lab work in November closer to time of reauthorization.

## 2021-08-13 NOTE — Telephone Encounter (Signed)
Yes ma'am correct. Her pcp contacted me cause they were scolding her for not starting even though it was approved. I had received a call from [facility] stating she never actually started it.

## 2021-08-13 NOTE — Addendum Note (Signed)
Addended by: Allean Found on: 08/13/2021 08:35 AM   Modules accepted: Orders

## 2021-08-13 NOTE — Telephone Encounter (Signed)
Called the pt back to scheduled lipid labs but had to leave a msg. I offered her the opportunity to get labs in [place] as I see she lives in [place] but I would need the pt to call back to wither schedule labs at [facility] or let us know if they prefer to go to [facility] or [facility] as that weill make a difference in the way we order them

## 2021-08-13 NOTE — Addendum Note (Signed)
Addended by: Allean Found on: 08/13/2021 03:47 PM   Modules accepted: Orders

## 2021-08-13 NOTE — Telephone Encounter (Addendum)
Please clarify - patient was seen in September of 2020 to start PCSK9i therapy. She still hasn't started it 2 years later? Multiple attempts were made to reach pt back in 2020 to schedule labs to assess efficacy.

## 2021-08-13 NOTE — Telephone Encounter (Signed)
I need pharmd to permission to order lipid panel for post 4th dose shot as pt never started .  Pt called and stated that they [facility] sent. I instructed the pt that she will need lab work but that I would have to call her back to schedule once the pharmd provides consent to place orders.

## 2021-08-13 NOTE — Telephone Encounter (Signed)
She has to be taking something for her cholesterol, her LDL was 39 in April 2022 (baseline is as high as 171). Need to clarify what she was taking when her labs were checked in April, she does have simvastatin '20mg'$  on her med list as well.  If she was not on Repatha at that time, insurance will not approve a new start request since her LDL is at goal. If she had been taking it when labs were drawn, you should be good to use those for a reauthorization request.

## 2021-08-16 ENCOUNTER — Ambulatory Visit: Payer: Medicare PPO | Admitting: Gastroenterology

## 2021-08-20 ENCOUNTER — Telehealth: Payer: Self-pay

## 2021-08-20 NOTE — Telephone Encounter (Signed)
Patient contacted this RN to notify there was no Rx at [facility] for 13HR Prep, contacted pharmacy to verify contact information, FAX NUMBER CHANGED to [phone], unable to change in system.    Contacted pharmacy on site to take verbal order. Pt to take 50 mg of prednisone on 08/22/21 at 10:00PM, 50 mg of prednisone on 08/23/21 at 0400 AM, and 50 mg of prednisone on 08/23/21 at 10:00AM. Pharmacist verbalized understanding and ordered.     Called pt back to advise of Pharmacy update and RX called in. Reviewed pt is also to take 50 mg of benadryl on 08/23/21 at 10:00. Advised to call [phone] with any questions.

## 2021-08-23 ENCOUNTER — Other Ambulatory Visit: Payer: Self-pay

## 2021-08-23 ENCOUNTER — Ambulatory Visit
Admission: RE | Admit: 2021-08-23 | Discharge: 2021-08-23 | Disposition: A | Discharge status: Home / Self Care | Payer: Medicare PPO | Source: Ambulatory Visit | Attending: Vascular Surgery | Admitting: Vascular Surgery

## 2021-08-23 DIAGNOSIS — I739 Peripheral vascular disease, unspecified: Secondary | ICD-10-CM

## 2021-08-23 MED ORDER — IOPAMIDOL (ISOVUE-370) INJECTION 76%
100.0000 mL | Freq: Once | INTRAVENOUS | Status: AC | PRN
  Administered 2021-08-23: 100 mL via INTRAVENOUS

## 2021-08-27 ENCOUNTER — Encounter: Payer: Self-pay | Admitting: Vascular Surgery

## 2021-08-27 ENCOUNTER — Other Ambulatory Visit: Payer: Self-pay

## 2021-08-27 ENCOUNTER — Ambulatory Visit (INDEPENDENT_AMBULATORY_CARE_PROVIDER_SITE_OTHER): Payer: Medicare PPO | Admitting: Vascular Surgery

## 2021-08-27 VITALS — BP 146/73 | HR 68 | Temp 97.9°F | Resp 14 | Ht 63.0 in | Wt 122.0 lb

## 2021-08-27 DIAGNOSIS — I739 Peripheral vascular disease, unspecified: Secondary | ICD-10-CM

## 2021-08-27 NOTE — Progress Notes (Signed)
Patient name: Andrea [name] MRN: [id number] DOB: February 11, 1966 Sex: female  REASON FOR CONSULT: Follow-up after CTA to evaluate for bilateral lower extremity claudication  HPI: Andrea [name] is a 55 y.o. female, with history of multiple medical issues including tobacco abuse that presents for follow-up after CTA to evaluate for recurrent bilateral lower extremity claudication.  Patient initially underwent right to left femorofemoral bypass by Dr. Amedeo Plenty approximately 19 years ago (in 2003) - using 7 mm Dacron.  She had to have a revision later with bilateral femoral patch angioplasty on 01/25/04. She has also undergone a right common iliac artery stent here by Dr. Amedeo Plenty and another iliac stent at [facility].   She has known inflow and outflow disease.  She was recently seen for follow-up with recurrent bilateral lower extremity claudication.  I sent her for CTA to evaluate for aortic disease versus iliac disease and stents versus an issue with her femorofemoral.  Feels her left leg is slightly worse.  No rest pain.  No tissue loss  Past Medical History:  Diagnosis Date   Allergy    Anemia    Anxiety    on Alprazolam   Arthritis    both hips   Cancer ([place])    skin cancer   Carotid artery occlusion    a. s/p prior endarterectomies with known RICA occlusion - followed by [facility].   Depression    on Effexor   Diabetes mellitus with circulatory complication ([place])    [id number]   Diastolic dysfunction without heart failure    Heart murmur    was seen by cardiologist in [facility] 2013   History of kidney stones    Hyperlipidemia    Hypertension    Peripheral vascular disease ([place])    PVD (peripheral vascular disease) ([place])    a. s/p prior LE bypass grafting and revision (followed by [facility]).   TIA (transient ischemic attack)    a. possible mini stroke in 2015 - was told she did not have full stroke.    Past Surgical History:  Procedure Laterality Date   2 stents     ABDOMINAL AORTOGRAM  W/LOWER EXTREMITY N/A 01/20/2019   Procedure: ABDOMINAL AORTOGRAM W/LOWER EXTREMITY;  Surgeon: Marty Heck, MD;  Location: [facility];  Service: Cardiovascular;  Laterality: N/A;   CAROTID ENDARTERECTOMY     CESAREAN SECTION     CHOLECYSTECTOMY     EXTRACORPOREAL SHOCK WAVE LITHOTRIPSY Left 03/29/2018   Procedure: LEFT EXTRACORPOREAL SHOCK WAVE LITHOTRIPSY (ESWL);  Surgeon: Franchot Gallo, MD;  [facility];  Service: Urology;  Laterality: Left;   FEMORAL BYPASS     x 2 on right side due to re-stenosis   left/right carotid artery     2003 and 2009 for right, right side is 100% occluded per patient; left side 2009   TUBAL LIGATION      Family History  Problem Relation Age of Onset   Diabetes Mother    Heart disease Mother    Hyperlipidemia Mother    Hypertension Mother    Heart disease Father        MI, unknown age   Hyperlipidemia Father    Hypertension Father    Diabetes Maternal Grandmother    Heart disease Maternal Grandmother    Hyperlipidemia Maternal Grandmother    Cancer Paternal Grandmother    Cancer Sister    Varicose Veins Sister    Heart attack Brother        Age 81  SOCIAL HISTORY: Social History   Socioeconomic History   Marital status: Married    Spouse name: Not on file   Number of children: 1   Years of education: College   Highest education level: Not on file  Occupational History   Occupation: retired/disabled    Employer: UNEMPLOYED  Tobacco Use   Smoking status: Every Day    Packs/day: 1.00    Years: 40.00    Pack years: 40.00    Types: Cigarettes   Smokeless tobacco: Never  Vaping Use   Vaping Use: Never used  Substance and Sexual Activity   Alcohol use: Yes    Alcohol/week: 0.0 standard drinks    Comment: holidays/special occas 3-4 drinks   Drug use: No   Sexual activity: Not Currently    Partners: Male  Other Topics Concern   Not on file  Social History Narrative   Pt is from [facility]   Patient  lives at home with her husband and son   Patient is right handed   Patient drinks coffee daily   Social Determinants of Health   Financial Resource Strain: Not on file  Food Insecurity: Not on file  Transportation Needs: Not on file  Physical Activity: Not on file  Stress: Not on file  Social Connections: Not on file  Intimate Partner Violence: Not on file    Allergies  Allergen Reactions   Atorvastatin     Muscle cramps   Iohexol Hives    Patient should take 13 hours prep    Other Hives    Contrast dye    Rosuvastatin Nausea And Vomiting    Nausea/vomiting    Current Outpatient Medications  Medication Sig Dispense Refill   amLODipine (NORVASC) 10 MG tablet Take 1 tablet (10 mg total) by mouth daily. 90 tablet 3   aspirin 81 MG tablet Take 1 tablet (81 mg total) by mouth daily. 30 tablet 11   cetirizine (ZYRTEC) 10 MG tablet Take 10 mg by mouth as needed for allergies.     clopidogrel (PLAVIX) 75 MG tablet Take 1 tablet (75 mg total) by mouth daily. 90 tablet 3   Evolocumab (REPATHA SURECLICK) [id number] MG/ML SOAJ Inject 140 mg into the skin every 14 (fourteen) days. 6 mL 3   fluticasone (FLONASE) 50 MCG/ACT nasal spray Place 2 sprays into both nostrils as needed for allergies or rhinitis.     ibuprofen (ADVIL,MOTRIN) 200 MG tablet Take 400 mg by mouth every 8 (eight) hours as needed (pain.).     Lancets MISC Test blood sugar once daily. 100 each 2   lisinopril (PRINIVIL,ZESTRIL) 2.5 MG tablet Take 1 tablet (2.5 mg total) by mouth daily. 90 tablet 3   metFORMIN (GLUCOPHAGE) 1000 MG tablet Take 1 tablet (1,000 mg total) by mouth 2 (two) times daily with a meal. 180 tablet 3   naproxen sodium (ALEVE) 220 MG tablet Take 440 mg by mouth 2 (two) times daily as needed (pain.).     simvastatin (ZOCOR) 20 MG tablet Take 1 tablet (20 mg total) by mouth at bedtime. 90 tablet 3   venlafaxine XR (EFFEXOR-XR) 150 MG 24 hr capsule Take 150 mg by mouth daily with breakfast.     diphenhydrAMINE  (BENADRYL) 50 MG capsule Take 50 mg by mouth 1 hour prior to your procedure. 1 capsule 0   predniSONE (DELTASONE) 50 MG tablet One tablet ('50mg'$ ) 13 hours prior to procedure; one tablet ('50mg'$ ) 7 hours prior to procedure and then one tablet (50 mg)  one hour prior to procedure. 3 tablet 0   predniSONE (DELTASONE) 50 MG tablet Pt to take 50 mg of prednisone on 08/22/21 at 10:00PM, 50 mg of prednisone on 08/23/21 at 0400 AM, and 50 mg of prednisone on 08/23/21 at 10:00AM. Pt is also to take 50 mg of benadryl on 08/23/21 at 10:00. Please call [phone] with any questions. 3 tablet 0   No current facility-administered medications for this visit.    REVIEW OF SYSTEMS:  '[X]'$  denotes positive finding, '[ ]'$  denotes negative finding Cardiac  Comments:  Chest pain or chest pressure:    Shortness of breath upon exertion:    Short of breath when lying flat:    Irregular heart rhythm:        Vascular    Pain in calf, thigh, or hip brought on by ambulation:    Pain in feet at night that wakes you up from your sleep:     Blood clot in your veins:    Leg swelling:         Pulmonary    Oxygen at home:    Productive cough:     Wheezing:         Neurologic    Sudden weakness in arms or legs:     Sudden numbness in arms or legs:     Sudden onset of difficulty speaking or slurred speech:    Temporary loss of vision in one eye:     Problems with dizziness:         Gastrointestinal    Blood in stool:     Vomited blood:         Genitourinary    Burning when urinating:     Blood in urine:        Psychiatric    Major depression:         Hematologic    Bleeding problems:    Problems with blood clotting too easily:        Skin    Rashes or ulcers:        Constitutional    Fever or chills:      PHYSICAL EXAM: Vitals:   08/27/21 1051  BP: (!) 146/73  Pulse: 68  Resp: 14  Temp: 97.9 F (36.6 C)  TempSrc: Temporal  Weight: 122 lb (55.3 kg)  Height: '5\' 3"'$  (1.6 m)    GENERAL: The patient is a  well-nourished female, in no acute distress. The vital signs are documented above. CARDIAC: There is a regular rate and rhythm.  VASCULAR:  Palpable pulse in fem fem bypass graft Both femoral pulses palpable Bilateral carotid neck incisions PULMONARY: No respiratory distress. ABDOMEN: Soft and non-tender. MUSCULOSKELETAL: There are no major deformities or cyanosis. NEUROLOGIC: No focal weakness or paresthesias are detected.    DATA:   CTA on my review shows a patent infrarenal abdominal aorta although does have moderate disease.  The right common and external iliac stents are widely patent.  The femorofemoral bypass is patent.  She has a moderate stenosis at the inflow and high-grade stenosis in the left groin outflow of her femorofemoral bypass.  Femorofemoral duplex shows a moderate right groin stenosis and a high-grade left groin stenosis at the outflow of the fem fem bypass.  Assessment/Plan:   54 year old female presents for follow-up to discuss CTA abdomen pelvis to evaluate for recurrent bilateral lower extremity claudication.  This is in the setting of remote right to left femorofemoral bypass with right common and external iliac stents.  I discussed with her in detail that the abdominal aorta is diseased but I do not see any flow-limiting stenosis in her aorta and all right iliac stents appear patent.  She does have moderate stenosis in the right groin and a high-grade stenosis on the left with a very tortuous femorofemoral that I think is from elongation of the graft over time.  This also correlates with her recent duplex.  Her left leg is the more symptomatic leg.  On the right appears to have infrainguinal disease with a proximal SFA occlusion.  She is still smoking over a pack a day and I think she is at exceedingly high risk for wound complications in addition to ongoing graft failure even after revision with her continued tobacco abuse.  Discussed that I think one option would be  revision of her femorofemoral bypass with initial focus in the left groin given this appears to be higher-grade stenosis.  This would only address her left leg symptoms.  In addition discussed if she feels she can tolerate her symptoms we can follow-up again in 3 months for continued surveillance with fem fem duplex and ABIs.  She wants to see me in 3 months.  We will defer surgical intervention at this time.  Discussed the importance of her trying to cut back and try to stop smoking.   Marty Heck, MD [facility] Office: [place]

## 2021-08-28 ENCOUNTER — Other Ambulatory Visit: Payer: Self-pay

## 2021-08-28 DIAGNOSIS — I739 Peripheral vascular disease, unspecified: Secondary | ICD-10-CM

## 2021-09-24 ENCOUNTER — Ambulatory Visit: Payer: Medicare PPO | Admitting: Gastroenterology

## 2021-09-24 ENCOUNTER — Encounter: Payer: Self-pay | Admitting: Gastroenterology

## 2021-09-24 VITALS — BP 152/66 | HR 64 | Ht 63.0 in | Wt 127.0 lb

## 2021-09-24 DIAGNOSIS — Z7901 Long term (current) use of anticoagulants: Secondary | ICD-10-CM

## 2021-09-24 DIAGNOSIS — Z86010 Personal history of colonic polyps: Secondary | ICD-10-CM | POA: Diagnosis not present

## 2021-09-24 DIAGNOSIS — Z8673 Personal history of transient ischemic attack (TIA), and cerebral infarction without residual deficits: Secondary | ICD-10-CM

## 2021-09-24 DIAGNOSIS — I739 Peripheral vascular disease, unspecified: Secondary | ICD-10-CM

## 2021-09-24 NOTE — Progress Notes (Signed)
Andrea [name]    [id number]    25-Mar-1966  Primary Care Physician:Inman, Faylene Million, NP  Referring Physician: Imagene Riches, NP [place],  [place] [address]   Chief complaint: History of colon polyps  HPI: 56 year old very pleasant female with history of hypertension, hyperlipidemia, TIA, CVA, bilateral carotid stenosis, carotid artery occlusion s/p endarterectomy on chronic antiplatelet therapy, Plavix here to discuss surveillance colonoscopy  Denies any nausea, vomiting, abdominal pain, melena or bright red blood per rectum No recent change in bowel habits but she has chronic constipation.  On average she has a bowel movement once every few days. No family history of colon cancer.  Colonoscopy July 23, 2015: 1. Flat polyp was found at the cecum; polypectomy was performed with a cold snare 2. Two sessile polyps were found in the sigmoid colon; polypectomies were performed with a cold snare - TUBULAR ADENOMA. NO HIGH GRADE DYSPLASIA IDENTIFIED. - HYPERPLASTIC POLYP. - FRAGMENT OF BENIGN COLONIC MUCOSA. - NO INVASIVE MALIGNANCY IDENTIFIED.  Outpatient Encounter Medications as of 09/24/2021  Medication Sig   amLODipine (NORVASC) 10 MG tablet Take 1 tablet (10 mg total) by mouth daily.   aspirin 81 MG tablet Take 1 tablet (81 mg total) by mouth daily.   cetirizine (ZYRTEC) 10 MG tablet Take 10 mg by mouth as needed for allergies.   clopidogrel (PLAVIX) 75 MG tablet Take 1 tablet (75 mg total) by mouth daily.   diphenhydrAMINE (BENADRYL) 50 MG capsule Take 50 mg by mouth 1 hour prior to your procedure.   Evolocumab (REPATHA SURECLICK) 191 MG/ML SOAJ Inject 140 mg into the skin every 14 (fourteen) days.   fluticasone (FLONASE) 50 MCG/ACT nasal spray Place 2 sprays into both nostrils as needed for allergies or rhinitis.   ibuprofen (ADVIL,MOTRIN) 200 MG tablet Take 400 mg by mouth every 8 (eight) hours as needed (pain.).   Lancets MISC Test blood sugar once  daily.   lisinopril (PRINIVIL,ZESTRIL) 2.5 MG tablet Take 1 tablet (2.5 mg total) by mouth daily.   metFORMIN (GLUCOPHAGE) 1000 MG tablet Take 1 tablet (1,000 mg total) by mouth 2 (two) times daily with a meal.   naproxen sodium (ALEVE) 220 MG tablet Take 440 mg by mouth 2 (two) times daily as needed (pain.).   predniSONE (DELTASONE) 50 MG tablet One tablet (50mg ) 13 hours prior to procedure; one tablet (50mg ) 7 hours prior to procedure and then one tablet (50 mg) one hour prior to procedure.   predniSONE (DELTASONE) 50 MG tablet Pt to take 50 mg of prednisone on 08/22/21 at 10:00PM, 50 mg of prednisone on 08/23/21 at 0400 AM, and 50 mg of prednisone on 08/23/21 at 10:00AM. Pt is also to take 50 mg of benadryl on 08/23/21 at 10:00. Please call [phone] with any questions.   simvastatin (ZOCOR) 20 MG tablet Take 1 tablet (20 mg total) by mouth at bedtime.   venlafaxine XR (EFFEXOR-XR) 150 MG 24 hr capsule Take 150 mg by mouth daily with breakfast.   No facility-administered encounter medications on file as of 09/24/2021.    Allergies as of 09/24/2021 - Review Complete 09/24/2021  Allergen Reaction Noted   Atorvastatin  07/26/2018   Iohexol Hives 04/13/2020   Other Hives 03/10/2014   Rosuvastatin Nausea And Vomiting 07/26/2018    Past Medical History:  Diagnosis Date   Allergy    Anemia    Anxiety    on Alprazolam   Arthritis  both hips   Cancer ([place])    skin cancer   Carotid artery occlusion    a. s/p prior endarterectomies with known RICA occlusion - followed by [facility].   Depression    on Effexor   Diabetes mellitus with circulatory complication ([place])    2353   Diastolic dysfunction without heart failure    Heart murmur    was seen by cardiologist in [facility] 2013   History of kidney stones    Hyperlipidemia    Hypertension    Peripheral vascular disease ([place])    PVD (peripheral vascular disease) ([place])    a. s/p prior LE bypass grafting and revision (followed by [facility]).   TIA  (transient ischemic attack)    a. possible mini stroke in 2015 - was told she did not have full stroke.    Past Surgical History:  Procedure Laterality Date   2 stents     ABDOMINAL AORTOGRAM W/LOWER EXTREMITY N/A 01/20/2019   Procedure: ABDOMINAL AORTOGRAM W/LOWER EXTREMITY;  Surgeon: Marty Heck, MD;  Location: [facility];  Service: Cardiovascular;  Laterality: N/A;   CAROTID ENDARTERECTOMY     CESAREAN SECTION     CHOLECYSTECTOMY     EXTRACORPOREAL SHOCK WAVE LITHOTRIPSY Left 03/29/2018   Procedure: LEFT EXTRACORPOREAL SHOCK WAVE LITHOTRIPSY (ESWL);  Surgeon: Franchot Gallo, MD;  [facility];  Service: Urology;  Laterality: Left;   FEMORAL BYPASS     x 2 on right side due to re-stenosis   left/right carotid artery     2003 and 2009 for right, right side is 100% occluded per patient; left side 2009   TUBAL LIGATION      Family History  Problem Relation Age of Onset   Diabetes Mother    Heart disease Mother    Hyperlipidemia Mother    Hypertension Mother    Heart disease Father        MI, unknown age   Hyperlipidemia Father    Hypertension Father    Diabetes Maternal Grandmother    Heart disease Maternal Grandmother    Hyperlipidemia Maternal Grandmother    Cancer Paternal Grandmother    Cancer Sister    Varicose Veins Sister    Heart attack Brother        Age 44    Social History   Socioeconomic History   Marital status: Married    Spouse name: Not on file   Number of children: 1   Years of education: College   Highest education level: Not on file  Occupational History   Occupation: retired/disabled    Fish farm manager: UNEMPLOYED  Tobacco Use   Smoking status: Every Day    Packs/day: 1.00    Years: 40.00    Pack years: 40.00    Types: Cigarettes   Smokeless tobacco: Never  Vaping Use   Vaping Use: Never used  Substance and Sexual Activity   Alcohol use: Yes    Alcohol/week: 0.0 standard drinks    Comment: holidays/special occas 3-4  drinks   Drug use: No   Sexual activity: Not Currently    Partners: Male  Other Topics Concern   Not on file  Social History Narrative   Pt is from [facility]   Patient lives at home with her husband and son   Patient is right handed   Patient drinks coffee daily   Social Determinants of Health   Financial Resource Strain: Not on file  Food Insecurity: Not on file  Transportation Needs: Not  on file  Physical Activity: Not on file  Stress: Not on file  Social Connections: Not on file  Intimate Partner Violence: Not on file      Review of systems: All other review of systems negative except as mentioned in the HPI.   Physical Exam: Vitals:   09/24/21 0857  BP: (!) 152/66  Pulse: 64   Body mass index is 22.5 kg/m. Gen:      No acute distress HEENT:  sclera anicteric Abd:      soft, non-tender; no palpable masses, no distension Ext:    No edema Neuro: alert and oriented x 3 Psych: normal mood and affect  Data Reviewed:  Reviewed labs, radiology imaging, old records and pertinent past GI work up   Assessment and Plan/Recommendations:  55 year old very pleasant female with history of hypertension, hyperlipidemia, TIA, stroke, severe peripheral vascular disease s/p femoral bypass and bilateral carotid stenosis s/p left carotid artery endarterectomy on chronic Plavix Given severe carotid stenosis and history of CVA and significant peripheral vascular disease, will request clearance from cardiology and vascular surgery prior to proceeding with surveillance colonoscopy. If she is high risk to hold Plavix, will plan to proceed while she continues antiplatelet therapy but if it is okay to hold Plavix as per prescribing MD, will plan to recommend holding it for 5 days prior to the procedure  Chronic idiopathic constipation: Use docusate 1 tablet daily at bedtime Increase dietary fiber and fluid intake Add Benefiber 1 tablespoon 2-3 times daily with meals  Return in  3 months  The patient was provided an opportunity to ask questions and all were answered. The patient agreed with the plan and demonstrated an understanding of the instructions.  Damaris Hippo , MD    CC: Imagene Riches, NP

## 2021-09-24 NOTE — Patient Instructions (Signed)
You will need to have a colonoscopy scheduled, We will get with your cardiology and vascular team to get authorization for you to have this procedure done prior to scheduling.   Once we receive the clearance we will call you back to schedule  If you are age 57 or older, your body mass index should be between 23-30. Your Body mass index is 22.5 kg/m. If this is out of the aforementioned range listed, please consider follow up with your Primary Care Provider.  If you are age 98 or younger, your body mass index should be between 19-25. Your Body mass index is 22.5 kg/m. If this is out of the aformentioned range listed, please consider follow up with your Primary Care Provider.   __________________________________________________________  The [facility] providers would like to encourage you to use [facility] to communicate with providers for non-urgent requests or questions.  Due to long hold times on the telephone, sending your provider a message by [facility] may be a faster and more efficient way to get a response.  Please allow 48 business hours for a response.  Please remember that this is for non-urgent requests.    I appreciate the  opportunity to care for you  Thank You   Harl Bowie , MD

## 2021-10-14 ENCOUNTER — Telehealth: Payer: Self-pay | Admitting: *Deleted

## 2021-10-14 NOTE — Telephone Encounter (Signed)
Dr Silverio Decamp, Patient is ok per Dr Carlis Abbott to have procedure and can hold her plavix   See Note  you wanted me to get cardiac clearance for this patient

## 2021-10-14 NOTE — Telephone Encounter (Signed)
===  View-only below this line=== ----- Message ----- From: Marty Heck, MD Sent: 10/14/2021   9:47 AM EDT To: Oda Kilts, CMA  Should be ok to hold plavix.  Would continue aspirin if able. ----- Message ----- From: Oda Kilts, CMA Sent: 10/14/2021   9:32 AM EDT To: Marty Heck, MD

## 2021-10-14 NOTE — Telephone Encounter (Signed)
Dr Silverio Decamp wants clearance from Vascular Surgery for this patient to have a colonoscopy in Jan 2023 while on plavix.    Dr Carlis Abbott,  Dr Silverio Decamp would like clearance for this pt to have a colonoscopy in Jan 2023 while on Plavix. Will this be safe for the patient to schedule

## 2021-10-16 NOTE — Telephone Encounter (Signed)
Got it - thanks.

## 2021-10-16 NOTE — Telephone Encounter (Signed)
HOLD plavix 5 days per Dr Silverio Decamp   Patient needs to be scheduled for colonoscopy and I will send her instructions in My Chart

## 2021-10-18 NOTE — Telephone Encounter (Signed)
Left message for pt to call back and schedule her colonoscopy. We just seen in the office on 09/24/21 She will not need  a previsit

## 2021-11-21 NOTE — Telephone Encounter (Signed)
Left message for pt to contact the office again to schedule her colonoscopy, She is ok to hold blood thinner

## 2021-11-26 ENCOUNTER — Ambulatory Visit (HOSPITAL_COMMUNITY)
Admission: RE | Admit: 2021-11-26 | Discharge: 2021-11-26 | Disposition: A | Discharge status: Home / Self Care | Payer: Medicare PPO | Source: Ambulatory Visit | Attending: Vascular Surgery | Admitting: Vascular Surgery

## 2021-11-26 ENCOUNTER — Other Ambulatory Visit: Payer: Self-pay

## 2021-11-26 ENCOUNTER — Encounter: Payer: Self-pay | Admitting: Vascular Surgery

## 2021-11-26 ENCOUNTER — Ambulatory Visit: Payer: Medicare PPO | Admitting: Vascular Surgery

## 2021-11-26 ENCOUNTER — Ambulatory Visit (INDEPENDENT_AMBULATORY_CARE_PROVIDER_SITE_OTHER)
Admission: RE | Admit: 2021-11-26 | Discharge: 2021-11-26 | Disposition: A | Discharge status: Home / Self Care | Payer: Medicare PPO | Source: Ambulatory Visit | Attending: Vascular Surgery | Admitting: Vascular Surgery

## 2021-11-26 VITALS — BP 141/67 | HR 68 | Temp 98.0°F | Resp 14 | Ht 63.0 in | Wt 125.0 lb

## 2021-11-26 DIAGNOSIS — I739 Peripheral vascular disease, unspecified: Secondary | ICD-10-CM | POA: Insufficient documentation

## 2021-11-26 NOTE — Progress Notes (Signed)
Patient name: Andrea [name] MRN: [id number] DOB: 02-04-1966 Sex: female  REASON FOR CONSULT: 3 month follow-up for bilateral lower extremity claudication  HPI: Andrea [name] is a 55 y.o. female, with history of multiple medical issues including tobacco abuse that presents for 3 month follow-up of recurrent bilateral lower extremity claudication.  Patient initially underwent right to left femorofemoral bypass by Dr. Amedeo Plenty in 2003 using 7 mm Dacron.  She had to have a revision later with bilateral femoral patch angioplasty in 2005. She has also undergone a right common iliac artery stent here by Dr. Amedeo Plenty and another iliac stent [facility].     She was previously seen for follow-up with recurrent bilateral lower extremity claudication.  I sent her for CTA to evaluate for aortic disease versus iliac disease in stents versus an issue with her femorofemoral bypass.  Ultimately we elected to delay intervention and continue surveillance.  On 5-month follow-up today she feels that her legs are stable if not somewhat improved.  Only having calf pain when she walks up a hill, but walking flat surfaces she does quite well.  Down to smoking half a pack a day.  No evidence of critical limb ischemia like rest pain or tissue loss.  Past Medical History:  Diagnosis Date   Allergy    Anemia    Anxiety    on Alprazolam   Arthritis    both hips   Cancer ([place])    skin cancer   Carotid artery occlusion    a. s/p prior endarterectomies with known RICA occlusion - followed by [facility].   Depression    on Effexor   Diabetes mellitus with circulatory complication ([place])    6568   Diastolic dysfunction without heart failure    Heart murmur    was seen by cardiologist in [facility] 2013   History of kidney stones    Hyperlipidemia    Hypertension    Peripheral vascular disease ([place])    PVD (peripheral vascular disease) ([place])    a. s/p prior LE bypass grafting and revision (followed by [facility]).   TIA  (transient ischemic attack)    a. possible mini stroke in 2015 - was told she did not have full stroke.    Past Surgical History:  Procedure Laterality Date   2 stents     ABDOMINAL AORTOGRAM W/LOWER EXTREMITY N/A 01/20/2019   Procedure: ABDOMINAL AORTOGRAM W/LOWER EXTREMITY;  Surgeon: Marty Heck, MD;  Location: [facility];  Service: Cardiovascular;  Laterality: N/A;   CAROTID ENDARTERECTOMY     CESAREAN SECTION     CHOLECYSTECTOMY     EXTRACORPOREAL SHOCK WAVE LITHOTRIPSY Left 03/29/2018   Procedure: LEFT EXTRACORPOREAL SHOCK WAVE LITHOTRIPSY (ESWL);  Surgeon: Franchot Gallo, MD;  [facility];  Service: Urology;  Laterality: Left;   FEMORAL BYPASS     x 2 on right side due to re-stenosis   left/right carotid artery     2003 and 2009 for right, right side is 100% occluded per patient; left side 2009   TUBAL LIGATION      Family History  Problem Relation Age of Onset   Diabetes Mother    Heart disease Mother    Hyperlipidemia Mother    Hypertension Mother    Heart disease Father        MI, unknown age   Hyperlipidemia Father    Hypertension Father    Diabetes Maternal Grandmother    Heart disease Maternal Grandmother  Hyperlipidemia Maternal Grandmother    Cancer Paternal Grandmother    Cancer Sister    Varicose Veins Sister    Heart attack Brother        Age 106    SOCIAL HISTORY: Social History   Socioeconomic History   Marital status: Married    Spouse name: Not on file   Number of children: 1   Years of education: College   Highest education level: Not on file  Occupational History   Occupation: retired/disabled    Fish farm manager: UNEMPLOYED  Tobacco Use   Smoking status: Every Day    Packs/day: 1.00    Years: 40.00    Pack years: 40.00    Types: Cigarettes   Smokeless tobacco: Never  Vaping Use   Vaping Use: Never used  Substance and Sexual Activity   Alcohol use: Yes    Alcohol/week: 0.0 standard drinks    Comment:  holidays/special occas 3-4 drinks   Drug use: No   Sexual activity: Not Currently    Partners: Male  Other Topics Concern   Not on file  Social History Narrative   Pt is from [facility]   Patient lives at home with her husband and son   Patient is right handed   Patient drinks coffee daily   Social Determinants of Health   Financial Resource Strain: Not on file  Food Insecurity: Not on file  Transportation Needs: Not on file  Physical Activity: Not on file  Stress: Not on file  Social Connections: Not on file  Intimate Partner Violence: Not on file    Allergies  Allergen Reactions   Atorvastatin     Muscle cramps   Iohexol Hives    Patient should take 13 hours prep    Other Hives    Contrast dye    Rosuvastatin Nausea And Vomiting    Nausea/vomiting    Current Outpatient Medications  Medication Sig Dispense Refill   amLODipine (NORVASC) 10 MG tablet Take 1 tablet (10 mg total) by mouth daily. 90 tablet 3   aspirin 81 MG tablet Take 1 tablet (81 mg total) by mouth daily. 30 tablet 11   cetirizine (ZYRTEC) 10 MG tablet Take 10 mg by mouth as needed for allergies.     clopidogrel (PLAVIX) 75 MG tablet Take 1 tablet (75 mg total) by mouth daily. 90 tablet 3   Evolocumab (REPATHA SURECLICK) 086 MG/ML SOAJ Inject 140 mg into the skin every 14 (fourteen) days. 6 mL 3   fluticasone (FLONASE) 50 MCG/ACT nasal spray Place 2 sprays into both nostrils as needed for allergies or rhinitis.     ibuprofen (ADVIL,MOTRIN) 200 MG tablet Take 400 mg by mouth every 8 (eight) hours as needed (pain.).     Lancets MISC Test blood sugar once daily. 100 each 2   lisinopril (PRINIVIL,ZESTRIL) 2.5 MG tablet Take 1 tablet (2.5 mg total) by mouth daily. 90 tablet 3   metFORMIN (GLUCOPHAGE) 1000 MG tablet Take 1 tablet (1,000 mg total) by mouth 2 (two) times daily with a meal. 180 tablet 3   naproxen sodium (ALEVE) 220 MG tablet Take 440 mg by mouth 2 (two) times daily as needed (pain.).      simvastatin (ZOCOR) 20 MG tablet Take 1 tablet (20 mg total) by mouth at bedtime. 90 tablet 3   venlafaxine XR (EFFEXOR-XR) 150 MG 24 hr capsule Take 150 mg by mouth daily with breakfast.     No current facility-administered medications for this visit.  REVIEW OF SYSTEMS:  [X]  denotes positive finding, [ ]  denotes negative finding Cardiac  Comments:  Chest pain or chest pressure:    Shortness of breath upon exertion:    Short of breath when lying flat:    Irregular heart rhythm:        Vascular    Pain in calf, thigh, or hip brought on by ambulation: x   Pain in feet at night that wakes you up from your sleep:     Blood clot in your veins:    Leg swelling:         Pulmonary    Oxygen at home:    Productive cough:     Wheezing:         Neurologic    Sudden weakness in arms or legs:     Sudden numbness in arms or legs:     Sudden onset of difficulty speaking or slurred speech:    Temporary loss of vision in one eye:     Problems with dizziness:         Gastrointestinal    Blood in stool:     Vomited blood:         Genitourinary    Burning when urinating:     Blood in urine:        Psychiatric    Major depression:         Hematologic    Bleeding problems:    Problems with blood clotting too easily:        Skin    Rashes or ulcers:        Constitutional    Fever or chills:      PHYSICAL EXAM: Vitals:   11/26/21 1116  BP: (!) 141/67  Pulse: 68  Resp: 14  Temp: 98 F (36.7 C)  TempSrc: Temporal  SpO2: 98%  Weight: 125 lb (56.7 kg)  Height: 5\' 3"  (1.6 m)    GENERAL: The patient is a well-nourished female, in no acute distress. The vital signs are documented above. CARDIAC: There is a regular rate and rhythm.  VASCULAR:  Palpable pulse in fem fem bypass graft No palpable pedal pulses but no tissue loss PULMONARY: No respiratory distress. ABDOMEN: Soft and non-tender. MUSCULOSKELETAL: There are no major deformities or cyanosis. NEUROLOGIC: No focal  weakness or paresthesias are detected.    DATA:   ABIs today are preserved and stable 0.6 on the right and 0.73 on the left  Femorofemoral duplex today again shows greater than 70% stenosis by velocity criteria in both the inflow and outflow of the graft   Previous CTA on my review shows a patent infrarenal abdominal aorta although does have moderate disease.  The right common and external iliac stents are widely patent.  The femorofemoral bypass is patent.  She has disease in both the inflow and outflow and graft itself is quite torturous   Assessment/Plan:   55 year old female presents for 62-month follow-up of her recurrent lower extremity calf claudication in setting of remote femorofemoral bypass with revision and right iliac stenting.  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  Fortunately her symptoms are stable if not improved over the last 3 months.  She has cut back on her smoking to half pack a day which is excellent.  I favor continued conservative management which she supports.  Her duplex would suggest greater than 70% stenosis in both the inflow and outflow.  This is an old bypass graft that is quite tortuous  and has been in place since 2003.  It has already previously undergone patch angioplasty and revision.  We have been following elevated velocities in the inflow and outflow for years and the bypass has remained patent.  Given stability of symptoms, I will see her again in 6 months with noninvasive imaging including carotid duplex for surveillance of her carotid disease.  No signs of critical limb ischemia and again claudication symptoms are stable.  If her femorofemoral bypass ever failed, I would strongly consider aortobifemoral bypass.   Marty Heck, MD [facility] Office: [place]

## 2021-11-27 ENCOUNTER — Other Ambulatory Visit: Payer: Self-pay | Admitting: *Deleted

## 2021-11-27 DIAGNOSIS — I739 Peripheral vascular disease, unspecified: Secondary | ICD-10-CM

## 2022-04-10 ENCOUNTER — Telehealth: Payer: Self-pay | Admitting: Cardiovascular Disease

## 2022-04-10 NOTE — Telephone Encounter (Signed)
Patient requesting to switch from Dr. Acie Fredrickson to Dr. Geraldo Pitter  ?

## 2022-04-29 ENCOUNTER — Other Ambulatory Visit: Payer: Self-pay

## 2022-04-29 ENCOUNTER — Ambulatory Visit: Payer: Medicare PPO | Admitting: Cardiology

## 2022-04-29 VITALS — BP 132/56 | HR 82 | Ht 63.0 in | Wt 126.6 lb

## 2022-04-29 DIAGNOSIS — R011 Cardiac murmur, unspecified: Secondary | ICD-10-CM | POA: Insufficient documentation

## 2022-04-29 DIAGNOSIS — I6523 Occlusion and stenosis of bilateral carotid arteries: Secondary | ICD-10-CM | POA: Diagnosis not present

## 2022-04-29 DIAGNOSIS — M1990 Unspecified osteoarthritis, unspecified site: Secondary | ICD-10-CM | POA: Insufficient documentation

## 2022-04-29 DIAGNOSIS — F419 Anxiety disorder, unspecified: Secondary | ICD-10-CM | POA: Insufficient documentation

## 2022-04-29 DIAGNOSIS — C801 Malignant (primary) neoplasm, unspecified: Secondary | ICD-10-CM | POA: Insufficient documentation

## 2022-04-29 DIAGNOSIS — D649 Anemia, unspecified: Secondary | ICD-10-CM | POA: Insufficient documentation

## 2022-04-29 DIAGNOSIS — Z87442 Personal history of urinary calculi: Secondary | ICD-10-CM | POA: Insufficient documentation

## 2022-04-29 DIAGNOSIS — I350 Nonrheumatic aortic (valve) stenosis: Secondary | ICD-10-CM

## 2022-04-29 DIAGNOSIS — E782 Mixed hyperlipidemia: Secondary | ICD-10-CM

## 2022-04-29 DIAGNOSIS — I5189 Other ill-defined heart diseases: Secondary | ICD-10-CM | POA: Insufficient documentation

## 2022-04-29 DIAGNOSIS — I6529 Occlusion and stenosis of unspecified carotid artery: Secondary | ICD-10-CM | POA: Insufficient documentation

## 2022-04-29 DIAGNOSIS — E785 Hyperlipidemia, unspecified: Secondary | ICD-10-CM | POA: Insufficient documentation

## 2022-04-29 DIAGNOSIS — T7840XA Allergy, unspecified, initial encounter: Secondary | ICD-10-CM | POA: Insufficient documentation

## 2022-04-29 DIAGNOSIS — F32A Depression, unspecified: Secondary | ICD-10-CM | POA: Insufficient documentation

## 2022-04-29 DIAGNOSIS — E1159 Type 2 diabetes mellitus with other circulatory complications: Secondary | ICD-10-CM | POA: Insufficient documentation

## 2022-04-29 DIAGNOSIS — I10 Essential (primary) hypertension: Secondary | ICD-10-CM

## 2022-04-29 DIAGNOSIS — E118 Type 2 diabetes mellitus with unspecified complications: Secondary | ICD-10-CM

## 2022-04-29 DIAGNOSIS — I739 Peripheral vascular disease, unspecified: Secondary | ICD-10-CM | POA: Diagnosis not present

## 2022-04-29 HISTORY — DX: Nonrheumatic aortic (valve) stenosis: I35.0

## 2022-04-29 NOTE — Patient Instructions (Addendum)

## 2022-04-29 NOTE — Progress Notes (Signed)
?Cardiology Office Note:   ? ?Date:  04/29/2022  ? ?ID:  Andrea [name], DOB 05/24/1966, MRN [id number] ? ?PCP:  Barnetta Chapel, NP  ?Cardiologist:  Jenean Lindau, MD  ? ?Referring MD: Barnetta Chapel, NP  ? ? ?ASSESSMENT:   ? ?1. PVD (peripheral vascular disease) ([place])   ?2. Mixed hyperlipidemia   ?3. HTN (hypertension), benign   ?4. Bilateral carotid artery stenosis   ?5. DM (diabetes mellitus) with complications ([place])   ?6. Mild aortic stenosis   ? ?PLAN:   ? ?In order of problems listed above: ? ?Atherosclerotic vascular disease: Secondary prevention stressed with patient.  Importance of compliance with diet medication stressed and she vocalized understanding.  She was advised to walk at least half an hour a day 5 days a week and she promises to do so. ?Cigarette smoker: I spent 5 minutes with the patient discussing solely about smoking. Smoking cessation was counseled. I suggested to the patient also different medications and pharmacological interventions. Patient is keen to try stopping on its own at this time. He will get back to me if he needs any further assistance in this matter. ?Essential hypertension: Blood pressure stable and diet was emphasized.  Lifestyle modification urged. ?Mixed dyslipidemia and diabetes mellitus: Managed by primary care.  She says she had blood work done recently and we will try to get a copy. ?Patient will be seen in follow-up appointment in 9 months or earlier if the patient has any concerns ? ? ? ?Medication Adjustments/Labs and Tests Ordered: ?Current medicines are reviewed at length with the patient today.  Concerns regarding medicines are outlined above.  ?No orders of the defined types were placed in this encounter. ? ?No orders of the defined types were placed in this encounter. ? ? ? ?No chief complaint on file. ?  ? ?History of Present Illness:   ? ?Andrea [name] is a 56 y.o. female.  Patient has past medical history of atherosclerotic vascular disease with  peripheral vascular disease, carotid artery stenosis, diabetes mellitus, dyslipidemia, mild aortic stenosis and unfortunately continues to smoke.  She switched providers and he is here for follow-up.  She does not take the best care of herself.  No chest pain orthopnea or PND.  At the time of my evaluation, the patient is alert awake oriented and in no distress. ? ?Past Medical History:  ?Diagnosis Date  ? Allergy   ? Anemia   ? Anxiety   ? on Alprazolam  ? Arthritis   ? both hips  ? Bilateral carotid artery stenosis 10/18/2014  ? Cancer [facility])   ? skin cancer  ? Carotid artery occlusion   ? a. s/p prior endarterectomies with known RICA occlusion - followed by [facility].  ? CVA (cerebral infarction) 06/22/2014  ? Depression   ? on Effexor  ? Diabetes mellitus with circulatory complication ([place])   ? 2011  ? Diastolic dysfunction without heart failure   ? DM (diabetes mellitus) with complications ([place]) 1/0/6269  ? Heart murmur   ? was seen by cardiologist in [facility]  ? History of kidney stones   ? HLD (hyperlipidemia) 08/29/2014  ? HTN (hypertension), benign 08/29/2014  ? Hyperlipidemia   ? Hypertension   ? Occlusion and stenosis of carotid artery without mention of cerebral infarction 04/12/2014  ? PVD (peripheral vascular disease) ([place])   ? a. s/p prior LE bypass grafting and revision (followed by [facility]).  ? Shingles 06/07/2016  ? Stroke ([place]) 06/22/2014  ?  TIA (transient ischemic attack)   ? a. possible mini stroke in 2015 - was told she did not have full stroke.  ? ? ?Past Surgical History:  ?Procedure Laterality Date  ? 2 stents    ? ABDOMINAL AORTOGRAM W/LOWER EXTREMITY N/A 01/20/2019  ? Procedure: ABDOMINAL AORTOGRAM W/LOWER EXTREMITY;  Surgeon: Marty Heck, MD;  Location: [facility];  Service: Cardiovascular;  Laterality: N/A;  ? CAROTID ENDARTERECTOMY    ? CESAREAN SECTION    ? CHOLECYSTECTOMY    ? EXTRACORPOREAL SHOCK WAVE LITHOTRIPSY Left 03/29/2018  ? Procedure: LEFT EXTRACORPOREAL SHOCK WAVE  LITHOTRIPSY (ESWL);  Surgeon: Franchot Gallo, MD;  Location: [facility];  Service: Urology;  Laterality: Left;  ? FEMORAL BYPASS    ? x 2 on right side due to re-stenosis  ? left/right carotid artery    ? 2003 and 2009 for right, right side is 100% occluded per patient; left side 2009  ? TUBAL LIGATION    ? ? ?Current Medications: ?Current Meds  ?Medication Sig  ? amLODipine (NORVASC) 10 MG tablet Take 1 tablet (10 mg total) by mouth daily.  ? aspirin 81 MG tablet Take 1 tablet (81 mg total) by mouth daily.  ? cetirizine (ZYRTEC) 10 MG tablet Take 10 mg by mouth as needed for allergies.  ? clopidogrel (PLAVIX) 75 MG tablet Take 1 tablet (75 mg total) by mouth daily.  ? Evolocumab (REPATHA SURECLICK) 846 MG/ML SOAJ Inject 140 mg into the skin every 14 (fourteen) days.  ? famotidine (PEPCID) 20 MG tablet Take 20 mg by mouth daily.  ? fluticasone (FLONASE) 50 MCG/ACT nasal spray Place 2 sprays into both nostrils as needed for allergies or rhinitis.  ? ibuprofen (ADVIL,MOTRIN) 200 MG tablet Take 400 mg by mouth every 8 (eight) hours as needed (pain.).  ? Lancets MISC Test blood sugar once daily.  ? lisinopril (PRINIVIL,ZESTRIL) 2.5 MG tablet Take 1 tablet (2.5 mg total) by mouth daily.  ? metFORMIN (GLUCOPHAGE) 1000 MG tablet Take 1 tablet (1,000 mg total) by mouth 2 (two) times daily with a meal.  ? naproxen sodium (ALEVE) 220 MG tablet Take 440 mg by mouth 2 (two) times daily as needed (pain.).  ? simvastatin (ZOCOR) 20 MG tablet Take 1 tablet (20 mg total) by mouth at bedtime.  ? venlafaxine XR (EFFEXOR-XR) 150 MG 24 hr capsule Take 150 mg by mouth daily with breakfast.  ?  ? ?Allergies:   Atorvastatin, Iohexol, Other, and Rosuvastatin  ? ?Social History  ? ?Socioeconomic History  ? Marital status: Married  ?  Spouse name: Not on file  ? Number of children: 1  ? Years of education: College  ? Highest education level: Not on file  ?Occupational History  ? Occupation: retired/disabled  ?  Employer: UNEMPLOYED  ?Tobacco  Use  ? Smoking status: Every Day  ?  Packs/day: 1.00  ?  Years: 40.00  ?  Pack years: 40.00  ?  Types: Cigarettes  ? Smokeless tobacco: Never  ?Vaping Use  ? Vaping Use: Never used  ?Substance and Sexual Activity  ? Alcohol use: Yes  ?  Alcohol/week: 0.0 standard drinks  ?  Comment: holidays/special occas 3-4 drinks  ? Drug use: No  ? Sexual activity: Not Currently  ?  Partners: Male  ?Other Topics Concern  ? Not on file  ?Social History Narrative  ? Pt is from [facility]  ? Patient lives at home with her husband and son  ? Patient is right handed  ?  Patient drinks coffee daily  ? ?Social Determinants of Health  ? ?Financial Resource Strain: Not on file  ?Food Insecurity: Not on file  ?Transportation Needs: Not on file  ?Physical Activity: Not on file  ?Stress: Not on file  ?Social Connections: Not on file  ?  ? ?Family History: ?The patient's family history includes Cancer in her paternal grandmother and sister; Diabetes in her maternal grandmother and mother; Heart attack in her brother; Heart disease in her father, maternal grandmother, and mother; Hyperlipidemia in her father, maternal grandmother, and mother; Hypertension in her father and mother; Varicose Veins in her sister. ? ?ROS:   ?Please see the history of present illness.    ?All other systems reviewed and are negative. ? ?EKGs/Labs/Other Studies Reviewed:   ? ?The following studies were reviewed today: ?EKG reveals sinus rhythm septal infarction and nonspecific ST-T changes ? ? ?Recent Labs: ?No results found for requested labs within last 8760 hours.  ?Recent Lipid Panel ?   ?Component Value Date/Time  ? CHOL 108 04/11/2021 0916  ? TRIG 171 (H) 04/11/2021 0916  ? HDL 41 04/11/2021 0916  ? CHOLHDL 2.6 04/11/2021 0916  ? CHOLHDL 6.7 (H) 08/13/2015 1057  ? VLDL 62 (H) 08/13/2015 1057  ? LDLCALC 39 04/11/2021 0916  ? ? ?Physical Exam:   ? ?VS:  BP (!) 132/56   Pulse 82   Ht '5\' 3"'$  (1.6 m)   Wt 126 lb 9.6 oz (57.4 kg)   SpO2 96%   BMI 22.43 kg/m?     ? ?Wt Readings from Last 3 Encounters:  ?04/29/22 126 lb 9.6 oz (57.4 kg)  ?11/26/21 125 lb (56.7 kg)  ?09/24/21 127 lb (57.6 kg)  ?  ? ?GEN: Patient is in no acute distress ?HEENT: Normal ?NECK: No JVD; No caroti

## 2023-01-01 ENCOUNTER — Other Ambulatory Visit (HOSPITAL_COMMUNITY): Payer: Self-pay

## 2023-01-02 ENCOUNTER — Other Ambulatory Visit (HOSPITAL_COMMUNITY): Payer: Self-pay

## 2023-04-09 ENCOUNTER — Encounter: Payer: Self-pay | Admitting: Physician Assistant

## 2023-04-09 ENCOUNTER — Other Ambulatory Visit (INDEPENDENT_AMBULATORY_CARE_PROVIDER_SITE_OTHER): Payer: Medicare PPO

## 2023-04-09 ENCOUNTER — Ambulatory Visit: Payer: Medicare PPO | Admitting: Physician Assistant

## 2023-04-09 VITALS — BP 120/50 | HR 76 | Ht 63.0 in | Wt 129.8 lb

## 2023-04-09 DIAGNOSIS — R1013 Epigastric pain: Secondary | ICD-10-CM

## 2023-04-09 DIAGNOSIS — Z86010 Personal history of colonic polyps: Secondary | ICD-10-CM

## 2023-04-09 LAB — CBC WITH DIFFERENTIAL/PLATELET
Basophils Absolute: 0.1 10*3/uL (ref 0.0–0.1)
Basophils Relative: 1.1 % (ref 0.0–3.0)
Eosinophils Absolute: 0.2 10*3/uL (ref 0.0–0.7)
Eosinophils Relative: 2 % (ref 0.0–5.0)
HCT: 46.6 % — ABNORMAL HIGH (ref 36.0–46.0)
Hemoglobin: 15.9 g/dL — ABNORMAL HIGH (ref 12.0–15.0)
Lymphocytes Relative: 43.1 % (ref 12.0–46.0)
Lymphs Abs: 3.4 10*3/uL (ref 0.7–4.0)
MCHC: 34.1 g/dL (ref 30.0–36.0)
MCV: 89.2 fl (ref 78.0–100.0)
Monocytes Absolute: 0.8 10*3/uL (ref 0.1–1.0)
Monocytes Relative: 10 % (ref 3.0–12.0)
Neutro Abs: 3.5 10*3/uL (ref 1.4–7.7)
Neutrophils Relative %: 43.8 % (ref 43.0–77.0)
Platelets: 270 10*3/uL (ref 150.0–400.0)
RBC: 5.22 Mil/uL — ABNORMAL HIGH (ref 3.87–5.11)
RDW: 14.1 % (ref 11.5–15.5)
WBC: 7.9 10*3/uL (ref 4.0–10.5)

## 2023-04-09 LAB — COMPREHENSIVE METABOLIC PANEL
ALT: 8 U/L (ref 0–35)
AST: 14 U/L (ref 0–37)
Albumin: 4.6 g/dL (ref 3.5–5.2)
Alkaline Phosphatase: 93 U/L (ref 39–117)
BUN: 9 mg/dL (ref 6–23)
CO2: 27 mEq/L (ref 19–32)
Calcium: 9.8 mg/dL (ref 8.4–10.5)
Chloride: 102 mEq/L (ref 96–112)
Creatinine, Ser: 0.57 mg/dL (ref 0.40–1.20)
GFR: 99.58 mL/min (ref >60.00)
Glucose, Bld: 99 mg/dL (ref 70–99)
Potassium: 4.8 mEq/L (ref 3.5–5.1)
Sodium: 139 mEq/L (ref 135–145)
Total Bilirubin: 0.4 mg/dL (ref 0.2–1.2)
Total Protein: 8.1 g/dL (ref 6.0–8.3)

## 2023-04-09 MED ORDER — OMEPRAZOLE 40 MG PO CPDR: 40.0000 mg | DELAYED_RELEASE_CAPSULE | Freq: Every day | ORAL | 4 refills | Status: DC

## 2023-04-09 NOTE — Progress Notes (Signed)
Subjective:    Patient ID: Andrea [name], female    DOB: 01/08/1966, 57 y.o.   MRN: [id number]  HPI Andrea [name] is a pleasant 57 year old white female, established with Dr. Lavon Paganini, who comes in today to discuss colonoscopy. She was last seen in October 2022, does have history of adenomatous colon polyps and at that time was to schedule for colonoscopy but required clearance because of her vascular issues.  She says she never heard back about scheduling the procedure. Her last colonoscopy was done in 2016 per Dr. Arlyce Dice, found to have a flat cecal polyp, 2 small sessile polyps in the sigmoid colon measuring 4 mm.  Path showed 1 tubular adenomatous polyp, 1 hyperplastic polyp and 1 benign polypoid tissue.  She has history of prior CVA/TIA, bilateral carotid stenosis for which she has undergone bilateral carotid endarterectomies, peripheral vascular disease status post right femorofemoral bypass remotely, diabetes mellitus, hypertension, Last echo 2022 with EF of 60 to 65% and mild aortic stenosis. She is maintained on Plavix and aspirin.  Currently Plavix being prescribed by her PCP Mauricio Po PA.  She has not had any cardiac or vascular issues over the past couple of years.  Current problems with constipation or diarrhea, unaware of any rectal bleeding or hematochezia.  Over the past 6 months she has been having some intermittent burning epigastric discomfort which she says is reminiscent of how she felt when she was having gallbladder problems very remotely and is status post cholecystectomy in the 90s.  This has been associated with some nausea but no vomiting.  She has not noticed any change in the symptoms before eating or after eating.  She was prescribed 20 mg of famotidine but says that has not made any difference.  Review of Systems.Pertinent positive and negative review of systems were noted in the above HPI section.  All other review of systems was otherwise negative.   Outpatient  Encounter Medications as of 04/09/2023  Medication Sig   amLODipine (NORVASC) 10 MG tablet Take 1 tablet (10 mg total) by mouth daily.   aspirin 81 MG tablet Take 1 tablet (81 mg total) by mouth daily.   cetirizine (ZYRTEC) 10 MG tablet Take 10 mg by mouth as needed for allergies.   clopidogrel (PLAVIX) 75 MG tablet Take 1 tablet (75 mg total) by mouth daily.   Evolocumab (REPATHA SURECLICK) 140 MG/ML SOAJ Inject 140 mg into the skin every 14 (fourteen) days.   fluticasone (FLONASE) 50 MCG/ACT nasal spray Place 2 sprays into both nostrils as needed for allergies or rhinitis.   ibuprofen (ADVIL,MOTRIN) 200 MG tablet Take 400 mg by mouth every 8 (eight) hours as needed (pain.).   Lancets MISC Test blood sugar once daily.   lisinopril (PRINIVIL,ZESTRIL) 2.5 MG tablet Take 1 tablet (2.5 mg total) by mouth daily.   metFORMIN (GLUCOPHAGE) 1000 MG tablet Take 1 tablet (1,000 mg total) by mouth 2 (two) times daily with a meal.   naproxen sodium (ALEVE) 220 MG tablet Take 440 mg by mouth 2 (two) times daily as needed (pain.).   omeprazole (PRILOSEC) 40 MG capsule Take 1 capsule (40 mg total) by mouth daily.   simvastatin (ZOCOR) 20 MG tablet Take 1 tablet (20 mg total) by mouth at bedtime.   venlafaxine XR (EFFEXOR-XR) 150 MG 24 hr capsule Take 150 mg by mouth daily with breakfast.   [DISCONTINUED] famotidine (PEPCID) 20 MG tablet Take 20 mg by mouth daily.   No facility-administered encounter medications on file as of  04/09/2023.   Allergies  Allergen Reactions   Atorvastatin     Muscle cramps   Iohexol Hives    Patient should take 13 hours prep    Other Hives    Contrast dye    Rosuvastatin Nausea And Vomiting    Nausea/vomiting   Patient Active Problem List   Diagnosis Date Noted   Allergy 04/29/2022   Anemia 04/29/2022   Anxiety 04/29/2022   Arthritis 04/29/2022   Cancer 04/29/2022   Carotid artery occlusion 04/29/2022   Depression 04/29/2022   Diabetes mellitus with circulatory  complication 04/29/2022   Diastolic dysfunction without heart failure 04/29/2022   Heart murmur 04/29/2022   History of kidney stones 04/29/2022   Hyperlipidemia 04/29/2022   Hypertension 04/29/2022   Mild aortic stenosis 04/29/2022   Shingles 06/07/2016   Bilateral carotid artery stenosis 10/18/2014   TIA (transient ischemic attack) 08/29/2014   HLD (hyperlipidemia) 08/29/2014   DM (diabetes mellitus) with complications 08/29/2014   HTN (hypertension), benign 08/29/2014   Stroke 06/22/2014   CVA (cerebral infarction) 06/22/2014   PVD (peripheral vascular disease) 04/12/2014   Occlusion and stenosis of carotid artery without mention of cerebral infarction 04/12/2014   Social History   Socioeconomic History   Marital status: Married    Spouse name: Not on file   Number of children: 1   Years of education: College   Highest education level: Not on file  Occupational History   Occupation: retired/disabled    Associate Professor: UNEMPLOYED  Tobacco Use   Smoking status: Every Day    Packs/day: 1.00    Years: 40.00    Additional pack years: 0.00    Total pack years: 40.00    Types: Cigarettes   Smokeless tobacco: Never  Vaping Use   Vaping Use: Never used  Substance and Sexual Activity   Alcohol use: Yes    Alcohol/week: 0.0 standard drinks of alcohol    Comment: holidays/special occas 3-4 drinks   Drug use: No   Sexual activity: Not Currently    Partners: Male  Other Topics Concern   Not on file  Social History Narrative   Pt is from [facility]   Patient lives at home with her husband and son   Patient is right handed   Patient drinks coffee daily   Social Determinants of Health   Financial Resource Strain: Not on file  Food Insecurity: Not on file  Transportation Needs: Not on file  Physical Activity: Inactive (07/05/2018)   Exercise Vital Sign    Days of Exercise per Week: 0 days    Minutes of Exercise per Session: 0 min  Stress: Stress Concern Present  (07/05/2018)   Andrea [name] [employer] - Occupational Stress Questionnaire    Feeling of Stress : To some extent  Social Connections: Socially Integrated (07/05/2018)   Social Connection and Isolation Panel [NHANES]    Frequency of Communication with Friends and Family: More than three times a week    Frequency of Social Gatherings with Friends and Family: Twice a week    Attends Religious Services: 1 to 4 times per year    Active Member of [employer] or Organizations: Yes    Attends Banker Meetings: 1 to 4 times per year    Marital Status: Married  Catering manager Violence: Not At Risk (07/05/2018)   Humiliation, Afraid, Rape, and Kick questionnaire    Fear of Current or Ex-Partner: No    Emotionally Abused: No    Physically Abused: No  Sexually Abused: No    Ms. Mcelhannon's family history includes Cancer in her paternal grandmother; Diabetes in her maternal grandmother and mother; Heart attack in her brother; Heart disease in her father, maternal grandmother, and mother; Hyperlipidemia in her father, maternal grandmother, and mother; Hypertension in her father and mother; Kidney failure in her mother; Liver disease in her son; Skin cancer in her sister; Varicose Veins in her sister.      Objective:    Vitals:   04/09/23 1059  BP: (!) 120/50  Pulse: 76    Physical Exam.Well-developed well-nourished, thin older white female in no acute distress.  Height, Weight, 129 BMI 22.99  HEENT; nontraumatic normocephalic, EOMI, PE R LA, sclera anicteric. Oropharynx; not examined today Neck; supple, no JVD Cardiovascular; regular rate and rhythm with S1-S2, no murmur rub or gallop Pulmonary; Clear bilaterally, somewhat decreased breath sounds bilateral bases Abdomen; soft, nontender, nondistended, no palpable mass or hepatosplenomegaly, bowel sounds are active Rectal; not done today Skin; benign exam, no jaundice rash or appreciable lesions Extremities; no  clubbing cyanosis or edema skin warm and dry Neuro/Psych; alert and oriented x4, grossly nonfocal mood and affect appropriate        Assessment & Plan:   #38 57 year old white female with history of adenomatous colon polyp-last colonoscopy 2016, overdue for follow-up. #2 chronic antiplatelet therapy-on Plavix and aspirin #3  4-month history of intermittent epigastric burning and nausea, no improvement with low-dose famotidine Rule out peptic ulcer disease, gastritis.  Patient is status post very remote cholecystectomy in the 90s, says symptoms are somewhat reminiscent.  #4 status post remote cholecystectomy #5.  Peripheral vascular disease status post bilateral carotid endarterectomies and prior right femorofemoral bypass #6.  History of TIA/CVA #7.  Diabetes mellitus #8.  Hypertension  Plan; patient will be scheduled for colonoscopy and EGD with Dr. Lavon Paganini.  Both procedures were discussed in detail with the patient including indications risk and benefits and she is agreeable to proceed. Plavix will need to be held for 5 days prior to procedure, we will communicate with her PCP/Regina [place] PA who has been prescribing to assure this is reasonable for this patient.  I have asked her to continue her baby aspirin. Stop low-dose famotidine and start trial of omeprazole 40 mg p.o. every morning AC #30 and 2 refills CBC and c-Met today Further recommendations pending results at endoscopic evaluation.   Raimundo Corbit S Lincoln Ginley PA-C 04/09/2023   Cc: No ref. provider found

## 2023-04-09 NOTE — Patient Instructions (Addendum)
_______________________________________________________  If your blood pressure at your visit was 140/90 or greater, please contact your primary care physician to follow up on this.  If you are age 59 or younger, your body mass index should be between 19-25. Your Body mass index is 22.99 kg/m. If this is out of the aformentioned range listed, please consider follow up with your Primary Care Provider.  ________________________________________________________  The [place] [facility] providers would like to encourage you to use [facility] to communicate with providers for non-urgent requests or questions.  Due to long hold times on the telephone, sending your provider a message by [facility] may be a faster and more efficient way to get a response.  Please allow 48 business hours for a response.  Please remember that this is for non-urgent requests.  _______________________________________________________  Your provider has requested that you go to the basement level for lab work before leaving today. Press "B" on the elevator. The lab is located at the first door on the left as you exit the elevator.  You have been scheduled for an endoscopy and colonoscopy. Please follow the written instructions given to you at your visit today. Please pick up your prep supplies at the pharmacy within the next 1-3 days. If you use inhalers (even only as needed), please bring them with you on the day of your procedure.  We have sent the following medications to your pharmacy for you to pick up at your convenience:  START: Omeprazole  one capsule every morning.  DISCONTINUE: Pepcid   CONTINUE: aspirin  Due to recent changes in healthcare laws, you may see the results of your imaging and laboratory studies on MyChart before your provider has had a chance to review them.  We understand that in some cases there may be results that are confusing or concerning to you. Not all laboratory results come back in the same time  frame and the provider may be waiting for multiple results in order to interpret others.  Please give [place] 48 hours in order for your provider to thoroughly review all the results before contacting the office for clarification of your results.   Thank you for entrusting me with your care and choosing [facility].  Amy Esterwood, PA-C

## 2023-04-27 ENCOUNTER — Telehealth: Payer: Self-pay

## 2023-04-27 ENCOUNTER — Telehealth: Payer: Self-pay | Admitting: Physician Assistant

## 2023-04-27 NOTE — Telephone Encounter (Signed)
Patient needing updated prep instructions for procedure on 5/28. Please advise.  Thank you

## 2023-04-27 NOTE — Telephone Encounter (Signed)
-----   Message from Cephus Shelling, MD sent at 04/27/2023 10:29 AM EDT ----- OK to hold plavix ----- Message ----- From: Lamona Curl, CMA Sent: 04/27/2023  10:23 AM EDT To: Cephus Shelling, MD

## 2023-04-27 NOTE — Telephone Encounter (Signed)
Patient advised that she has been given clearance to hold Plavix 5 days prior to colonoscopy scheduled for 05-19-23.  Patient advised to take last dose of Plavix on 05-13-23, and she will be advised when to restart Plavix by Dr Lavon Paganini after the procedure.  Patient agreed to plan and verbalized understanding.  No further questions.

## 2023-04-27 NOTE — Telephone Encounter (Signed)
Returned call to the patient and explained new instructions for rescheduled date on 05-19-23.  Patient agreed to plan and verbalized understanding.  No further questions.

## 2023-05-19 ENCOUNTER — Encounter: Payer: Self-pay | Admitting: Gastroenterology

## 2023-05-19 ENCOUNTER — Ambulatory Visit (AMBULATORY_SURGERY_CENTER): Payer: Medicare PPO | Admitting: Gastroenterology

## 2023-05-19 VITALS — BP 115/46 | HR 59 | Temp 98.3°F | Resp 21 | Ht 63.0 in | Wt 129.0 lb

## 2023-05-19 DIAGNOSIS — Z86010 Personal history of colonic polyps: Secondary | ICD-10-CM

## 2023-05-19 DIAGNOSIS — Z09 Encounter for follow-up examination after completed treatment for conditions other than malignant neoplasm: Secondary | ICD-10-CM

## 2023-05-19 DIAGNOSIS — K2100 Gastro-esophageal reflux disease with esophagitis, without bleeding: Secondary | ICD-10-CM | POA: Diagnosis not present

## 2023-05-19 DIAGNOSIS — K2270 Barrett's esophagus without dysplasia: Secondary | ICD-10-CM

## 2023-05-19 DIAGNOSIS — R1013 Epigastric pain: Secondary | ICD-10-CM

## 2023-05-19 MED ORDER — FLUCONAZOLE 100 MG PO TABS
100.0000 mg | ORAL_TABLET | Freq: Every day | ORAL | 0 refills | Status: DC
Start: 2023-05-19 — End: 2023-11-04

## 2023-05-19 MED ORDER — SODIUM CHLORIDE 0.9 % IV SOLN
500.0000 mL | Freq: Once | INTRAVENOUS | Status: DC
Start: 2023-05-19 — End: 2023-05-19

## 2023-05-19 NOTE — Progress Notes (Signed)
[facility] History and Physical   Primary Care Physician:  Alveria Apley, NP (Inactive)   Reason for Procedure:  Epigastric pain, nausea, h/o adenomatous colon polyps  Plan:    EGD and colonoscopy with possible interventions as needed     HPI: Andrea [name] is a very pleasant 58 y.o. female here for EGD and colonoscopy for evaluation of epigastric pain, nausea and h/o adenomatous colon polyps.   The risks and benefits as well as alternatives of endoscopic procedure(s) have been discussed and reviewed. All questions answered. The patient agrees to proceed.    Past Medical History:  Diagnosis Date   Allergy    Anemia    Anxiety    on Alprazolam   Arthritis    both hips   Bilateral carotid artery stenosis 10/18/2014   Cancer (HCC)    skin cancer   Carotid artery occlusion    a. s/p prior endarterectomies with known RICA occlusion - followed by [facility].   CVA (cerebral infarction) 06/22/2014   Depression    on Effexor   Diabetes mellitus with circulatory complication (HCC)    2011   Diastolic dysfunction without heart failure    DM (diabetes mellitus) with complications (HCC) 08/29/2014   Heart murmur    was seen by cardiologist in [facility] 2013   History of kidney stones    HLD (hyperlipidemia) 08/29/2014   HTN (hypertension), benign 08/29/2014   Hyperlipidemia    Hypertension    Occlusion and stenosis of carotid artery without mention of cerebral infarction 04/12/2014   PVD (peripheral vascular disease) (HCC)    a. s/p prior LE bypass grafting and revision (followed by [facility]).   Shingles 06/07/2016   Stroke (HCC) 06/22/2014   TIA (transient ischemic attack)    a. possible mini stroke in 2015 - was told she did not have full stroke.    Past Surgical History:  Procedure Laterality Date   2 stents     ABDOMINAL AORTOGRAM W/LOWER EXTREMITY N/A 01/20/2019   Procedure: ABDOMINAL AORTOGRAM W/LOWER EXTREMITY;  Surgeon: Cephus Shelling, MD;  Location:  [facility];  Service: Cardiovascular;  Laterality: N/A;   CAROTID ENDARTERECTOMY     CESAREAN SECTION     CHOLECYSTECTOMY     EXTRACORPOREAL SHOCK WAVE LITHOTRIPSY Left 03/29/2018   Procedure: LEFT EXTRACORPOREAL SHOCK WAVE LITHOTRIPSY (ESWL);  Surgeon: Marcine Matar, MD;  Location: [facility];  Service: Urology;  Laterality: Left;   FEMORAL BYPASS     x 2 on right side due to re-stenosis   left/right carotid artery     2003 and 2009 for right, right side is 100% occluded per patient; left side 2009   TUBAL LIGATION      Prior to Admission medications   Medication Sig Start Date End Date Taking? Authorizing Provider  amLODipine (NORVASC) 10 MG tablet Take 1 tablet (10 mg total) by mouth daily. 11/10/18  Yes Benjiman Core D, PA-C  aspirin 81 MG tablet Take 1 tablet (81 mg total) by mouth daily. 04/27/17  Yes Dunn, Dayna N, PA-C  lisinopril (PRINIVIL,ZESTRIL) 2.5 MG tablet Take 1 tablet (2.5 mg total) by mouth daily. 11/10/18  Yes Benjiman Core D, PA-C  metFORMIN (GLUCOPHAGE) 1000 MG tablet Take 1 tablet (1,000 mg total) by mouth 2 (two) times daily with a meal. 11/10/18  Yes Barnett Abu, [place] D, PA-C  omeprazole (PRILOSEC) 40 MG capsule Take 1 capsule (40 mg total) by mouth daily. 04/09/23  Yes Esterwood, Amy S, PA-C  simvastatin (  ZOCOR) 20 MG tablet Take 1 tablet (20 mg total) by mouth at bedtime. 07/05/18  Yes Benjiman Core D, PA-C  venlafaxine XR (EFFEXOR-XR) 150 MG 24 hr capsule Take 150 mg by mouth daily with breakfast.   Yes [provider]  cetirizine (ZYRTEC) 10 MG tablet Take 10 mg by mouth as needed for allergies.    [provider]  clopidogrel (PLAVIX) 75 MG tablet Take 1 tablet (75 mg total) by mouth daily. 11/10/18   Benjiman Core D, PA-C  Evolocumab (REPATHA SURECLICK) 140 MG/ML SOAJ Inject 140 mg into the skin every 14 (fourteen) days. 08/13/21   Nahser, Deloris Ping, MD  FARXIGA 10 MG TABS tablet Take by mouth. Patient not taking: Reported on  05/19/2023 03/04/23   [provider]  fluticasone (FLONASE) 50 MCG/ACT nasal spray Place 2 sprays into both nostrils as needed for allergies or rhinitis.    [provider]  Lancets MISC Test blood sugar once daily. 06/09/18   Benjiman Core D, PA-C  TRELEGY ELLIPTA 200-62.5-25 MCG/ACT AEPB Inhale into the lungs. 12/09/22   [provider]    Current Outpatient Medications  Medication Sig Dispense Refill   amLODipine (NORVASC) 10 MG tablet Take 1 tablet (10 mg total) by mouth daily. 90 tablet 3   aspirin 81 MG tablet Take 1 tablet (81 mg total) by mouth daily. 30 tablet 11   lisinopril (PRINIVIL,ZESTRIL) 2.5 MG tablet Take 1 tablet (2.5 mg total) by mouth daily. 90 tablet 3   metFORMIN (GLUCOPHAGE) 1000 MG tablet Take 1 tablet (1,000 mg total) by mouth 2 (two) times daily with a meal. 180 tablet 3   omeprazole (PRILOSEC) 40 MG capsule Take 1 capsule (40 mg total) by mouth daily. 30 capsule 4   simvastatin (ZOCOR) 20 MG tablet Take 1 tablet (20 mg total) by mouth at bedtime. 90 tablet 3   venlafaxine XR (EFFEXOR-XR) 150 MG 24 hr capsule Take 150 mg by mouth daily with breakfast.     cetirizine (ZYRTEC) 10 MG tablet Take 10 mg by mouth as needed for allergies.     clopidogrel (PLAVIX) 75 MG tablet Take 1 tablet (75 mg total) by mouth daily. 90 tablet 3   Evolocumab (REPATHA SURECLICK) 140 MG/ML SOAJ Inject 140 mg into the skin every 14 (fourteen) days. 6 mL 3   FARXIGA 10 MG TABS tablet Take by mouth. (Patient not taking: Reported on 05/19/2023)     fluticasone (FLONASE) 50 MCG/ACT nasal spray Place 2 sprays into both nostrils as needed for allergies or rhinitis.     Lancets MISC Test blood sugar once daily. 100 each 2   TRELEGY ELLIPTA 200-62.5-25 MCG/ACT AEPB Inhale into the lungs.     Current Facility-Administered Medications  Medication Dose Route Frequency Provider Last Rate Last Admin   0.9 %  sodium chloride infusion  500 mL Intravenous Once Napoleon Form, MD        Allergies as of 05/19/2023 - Review Complete 05/19/2023  Allergen Reaction Noted   Atorvastatin  07/26/2018   Iohexol Hives 04/13/2020   Other Hives 03/10/2014   Rosuvastatin Nausea And Vomiting 07/26/2018    Family History  Problem Relation Age of Onset   Diabetes Mother    Heart disease Mother    Hyperlipidemia Mother    Hypertension Mother    Kidney failure Mother    Heart disease Father        MI, unknown age   Hyperlipidemia Father    Hypertension Father  Skin cancer Sister    Varicose Veins Sister    Heart attack Brother        Age 105   Diabetes Maternal Grandmother    Heart disease Maternal Grandmother    Hyperlipidemia Maternal Grandmother    Cancer Paternal Grandmother        unknown type- possibly stomach   Liver disease Son        fatty liver   Colon cancer Neg Hx    Esophageal cancer Neg Hx     Social History   Socioeconomic History   Marital status: Married    Spouse name: Not on file   Number of children: 1   Years of education: College   Highest education level: Not on file  Occupational History   Occupation: retired/disabled    Associate Professor: UNEMPLOYED  Tobacco Use   Smoking status: Every Day    Packs/day: 1.00    Years: 40.00    Additional pack years: 0.00    Total pack years: 40.00    Types: Cigarettes   Smokeless tobacco: Never  Vaping Use   Vaping Use: Never used  Substance and Sexual Activity   Alcohol use: Yes    Alcohol/week: 0.0 standard drinks of alcohol    Comment: holidays/special occas 3-4 drinks   Drug use: No   Sexual activity: Not Currently    Partners: Male  Other Topics Concern   Not on file  Social History Narrative   Pt is from [facility]   Patient lives at home with her husband and son   Patient is right handed   Patient drinks coffee daily   Social Determinants of Health   Financial Resource Strain: Not on file  Food Insecurity: Not on file  Transportation Needs: Not on file  Physical  Activity: Inactive (07/05/2018)   Exercise Vital Sign    Days of Exercise per Week: 0 days    Minutes of Exercise per Session: 0 min  Stress: Stress Concern Present (07/05/2018)   [employer] of Occupational Health - Occupational Stress Questionnaire    Feeling of Stress : To some extent  Social Connections: Socially Integrated (07/05/2018)   Social Connection and Isolation Panel [NHANES]    Frequency of Communication with Friends and Family: More than three times a week    Frequency of Social Gatherings with Friends and Family: Twice a week    Attends Religious Services: 1 to 4 times per year    Active Member of [employer] or Organizations: Yes    Attends Banker Meetings: 1 to 4 times per year    Marital Status: Married  Catering manager Violence: Not At Risk (07/05/2018)   Humiliation, Afraid, Rape, and Kick questionnaire    Fear of Current or Ex-Partner: No    Emotionally Abused: No    Physically Abused: No    Sexually Abused: No    Review of Systems:  All other review of systems negative except as mentioned in the HPI.  Physical Exam: Vital signs in last 24 hours: Blood Pressure (Abnormal) 105/57   Pulse 75   Temperature 98.3 F (36.8 C)   Height 5\' 3"  (1.6 m)   Weight 129 lb (58.5 kg)   Oxygen Saturation 97%   Body Mass Index 22.85 kg/m  General:   Alert, NAD Lungs:  Clear .   Heart:  Regular rate and rhythm Abdomen:  Soft, nontender and nondistended. Neuro/Psych:  Alert and cooperative. Normal mood and affect. A and O x 3  Reviewed labs, radiology imaging, old records and pertinent past GI work up  Patient is appropriate for planned procedure(s) and anesthesia in an ambulatory setting   K. Scherry Ran , MD [phone]

## 2023-05-19 NOTE — Patient Instructions (Signed)
Handout on esophagitis given to patient  Await pathology results  Continue with repeat colonoscopy at 11:30 am with Dr. Adela Lank - will need to be here at 10:30 am (instructions included)  Sent in prescription for Diflucan 100 mg daily for 3 weeks to the [facility] in [place].    YOU HAD AN ENDOSCOPIC PROCEDURE TODAY AT THE [facility]:   Refer to the procedure report that was given to you for any specific questions about what was found during the examination.  If the procedure report does not answer your questions, please call your gastroenterologist to clarify.  If you requested that your care partner not be given the details of your procedure findings, then the procedure report has been included in a sealed envelope for you to review at your convenience later.  YOU SHOULD EXPECT: Some feelings of bloating in the abdomen. Passage of more gas than usual.  Walking can help get rid of the air that was put into your GI tract during the procedure and reduce the bloating. If you had a lower endoscopy (such as a colonoscopy or flexible sigmoidoscopy) you may notice spotting of blood in your stool or on the toilet paper. If you underwent a bowel prep for your procedure, you may not have a normal bowel movement for a few days.  Please Note:  You might notice some irritation and congestion in your nose or some drainage.  This is from the oxygen used during your procedure.  There is no need for concern and it should clear up in a day or so.  SYMPTOMS TO REPORT IMMEDIATELY:  Following lower endoscopy (colonoscopy or flexible sigmoidoscopy):  Excessive amounts of blood in the stool  Significant tenderness or worsening of abdominal pains  Swelling of the abdomen that is new, acute  Fever of 100F or higher  Following upper endoscopy (EGD)  Vomiting of blood or coffee ground material  New chest pain or pain under the shoulder blades  Painful or persistently difficult swallowing  New  shortness of breath  Fever of 100F or higher  Black, tarry-looking stools  For urgent or emergent issues, a gastroenterologist can be reached at any hour by calling [phone]. Do not use MyChart messaging for urgent concerns.    DIET:  We do recommend a small meal at first, but then you may proceed to your regular diet.  Drink plenty of fluids but you should avoid alcoholic beverages for 24 hours.  ACTIVITY:  You should plan to take it easy for the rest of today and you should NOT DRIVE or use heavy machinery until tomorrow (because of the sedation medicines used during the test).    FOLLOW UP: Our staff will call the number listed on your records the next business day following your procedure.  We will call around 7:15- 8:00 am to check on you and address any questions or concerns that you may have regarding the information given to you following your procedure. If we do not reach you, we will leave a message.     If any biopsies were taken you will be contacted by phone or by letter within the next 1-3 weeks.  Please call us at [phone] if you have not heard about the biopsies in 3 weeks.    SIGNATURES/CONFIDENTIALITY: You and/or your care partner have signed paperwork which will be entered into your electronic medical record.  These signatures attest to the fact that that the information above on your After Visit Summary  has been reviewed and is understood.  Full responsibility of the confidentiality of this discharge information lies with you and/or your care-partner.

## 2023-05-19 NOTE — Op Note (Signed)
[facility] Patient Name: Andrea [name] Procedure Date: 05/19/2023 2:37 PM MRN: [id number] Endoscopist: Napoleon Form , MD, [id number] Age: 57 Referring MD:  Date of Birth: 1966-04-18 Gender: Female Account #: [id number] Procedure:                Upper GI endoscopy Indications:              Epigastric abdominal pain, Nausea Medicines:                Monitored Anesthesia Care Procedure:                Pre-Anesthesia Assessment:                           - Prior to the procedure, a History and Physical                            was performed, and patient medications and                            allergies were reviewed. The patient's tolerance of                            previous anesthesia was also reviewed. The risks                            and benefits of the procedure and the sedation                            options and risks were discussed with the patient.                            All questions were answered, and informed consent                            was obtained. Prior Anticoagulants: The patient                            last took Plavix (clopidogrel) 5 days prior to the                            procedure. ASA Grade Assessment: III - A patient                            with severe systemic disease. After reviewing the                            risks and benefits, the patient was deemed in                            satisfactory condition to undergo the procedure.                           After obtaining informed consent, the endoscope was  passed under direct vision. Throughout the                            procedure, the patient's blood pressure, pulse, and                            oxygen saturations were monitored continuously. The                            GIF HQ190 #[id number] was introduced through the                            mouth, and advanced to the second part of duodenum.                             The upper GI endoscopy was accomplished without                            difficulty. The patient tolerated the procedure                            well. Scope In: Scope Out: Findings:                 LA Grade B (one or more mucosal breaks greater than                            5 mm, not extending between the tops of two mucosal                            folds) esophagitis with no bleeding was found 30 to                            38 cm from the incisors. Biopsies were taken with a                            cold forceps for histology.                           The gastroesophageal flap valve was visualized                            endoscopically and classified as Hill Grade II                            (fold present, opens with respiration).                           The stomach was normal.                           The cardia and gastric fundus were normal on  retroflexion.                           The examined duodenum was normal. Complications:            No immediate complications. Estimated Blood Loss:     Estimated blood loss was minimal. Impression:               - LA Grade B reflux and candidiasis esophagitis                            with no bleeding. Biopsied.                           - Gastroesophageal flap valve classified as Hill                            Grade II (fold present, opens with respiration).                           - Normal stomach.                           - Normal examined duodenum. Recommendation:           - Resume previous diet.                           - Continue present medications.                           - Await pathology results.                           - Diflucan (fluconazole) 100 mg PO daily for 3                            weeks.                           - Continue Omeprazole                           - Follow an antireflux regimen. Napoleon Form, MD 05/19/2023 3:04:31 PM This report has been  signed electronically.

## 2023-05-19 NOTE — Progress Notes (Signed)
Vss nad trans to pacu colonoscopy aborted due to poor prep

## 2023-05-19 NOTE — Op Note (Signed)
[facility] Patient Name: Andrea [name] Procedure Date: 05/19/2023 2:37 PM MRN: [id number] Endoscopist: Napoleon Form , MD, [id number] Age: 57 Referring MD:  Date of Birth: 05-Sep-1966 Gender: Female Account #: [id number] Procedure:                Colonoscopy Indications:              High risk colon cancer surveillance: Personal                            history of colonic polyps, High risk colon cancer                            surveillance: Personal history of adenoma less than                            10 mm in size Medicines:                Monitored Anesthesia Care Procedure:                Pre-Anesthesia Assessment:                           - Prior to the procedure, a History and Physical                            was performed, and patient medications and                            allergies were reviewed. The patient's tolerance of                            previous anesthesia was also reviewed. The risks                            and benefits of the procedure and the sedation                            options and risks were discussed with the patient.                            All questions were answered, and informed consent                            was obtained. Prior Anticoagulants: The patient                            last took Plavix (clopidogrel) 5 days prior to the                            procedure. ASA Grade Assessment: III - A patient                            with severe systemic disease. After reviewing the  risks and benefits, the patient was deemed in                            satisfactory condition to undergo the procedure.                           After obtaining informed consent, the colonoscope                            was passed under direct vision. Throughout the                            procedure, the patient's blood pressure, pulse, and                            oxygen saturations were  monitored continuously. The                            [id number]-[id number] Colonoscope [id number] was introduced                            through the anus with the intention of advancing to                            the cecum. The scope was advanced to the sigmoid                            colon before the procedure was aborted. Medications                            were given. The colonoscopy was performed without                            difficulty. The patient tolerated the procedure                            well. The quality of the bowel preparation was                            poor. The rectum was photographed. Scope In: 2:50:44 PM Scope Out: 2:51:45 PM Total Procedure Duration: 0 hours 1 minute 1 second  Findings:                 A large amount of liquid stool was found in the                            rectum, interfering with visualization. Lavage of                            the area was performed, resulting in incomplete                            clearance with continued poor visualization. Complications:  No immediate complications. Estimated Blood Loss:     Estimated blood loss was minimal. Impression:               - Preparation of the colon was poor.                           - Liquid stool in the rectum, procedure was aborted.                           - No specimens collected. Recommendation:           - Clear liquid diet.                           - Continue present medications.                           - Plan for additional bowel prep to prepare for                            colonoscopy tomorrow                           - Schedule colonoscopy tomorrow with Dr.Armbruster                           - Continue to hold plavix, restart recommendations                            based on findings on colonoscopy Napoleon Form, MD 05/19/2023 3:01:50 PM This report has been signed electronically.

## 2023-05-19 NOTE — Progress Notes (Signed)
Called to room to assist during endoscopic procedure.  Patient ID and intended procedure confirmed with present staff. Received instructions for my participation in the procedure from the performing physician.  

## 2023-05-20 ENCOUNTER — Encounter: Payer: Self-pay | Admitting: Gastroenterology

## 2023-05-20 ENCOUNTER — Ambulatory Visit (AMBULATORY_SURGERY_CENTER): Payer: Medicare PPO | Admitting: Gastroenterology

## 2023-05-20 VITALS — BP 124/57 | HR 62 | Temp 98.3°F | Resp 12 | Ht 63.0 in | Wt 129.0 lb

## 2023-05-20 DIAGNOSIS — Z09 Encounter for follow-up examination after completed treatment for conditions other than malignant neoplasm: Secondary | ICD-10-CM | POA: Diagnosis not present

## 2023-05-20 DIAGNOSIS — D120 Benign neoplasm of cecum: Secondary | ICD-10-CM | POA: Diagnosis not present

## 2023-05-20 DIAGNOSIS — Z86010 Personal history of colonic polyps: Secondary | ICD-10-CM

## 2023-05-20 DIAGNOSIS — K635 Polyp of colon: Secondary | ICD-10-CM

## 2023-05-20 DIAGNOSIS — D125 Benign neoplasm of sigmoid colon: Secondary | ICD-10-CM | POA: Diagnosis not present

## 2023-05-20 MED ORDER — SODIUM CHLORIDE 0.9 % IV SOLN
500.0000 mL | INTRAVENOUS | Status: AC
Start: 2023-05-20 — End: ?

## 2023-05-20 NOTE — Progress Notes (Signed)
Called to room to assist during endoscopic procedure.  Patient ID and intended procedure confirmed with present staff. Received instructions for my participation in the procedure from the performing physician.  

## 2023-05-20 NOTE — Op Note (Signed)
[facility] Patient Name: Andrea [name] Procedure Date: 05/20/2023 11:22 AM MRN: [id number] Endoscopist: Viviann Spare P. Adela Lank , MD, [id number] Age: 57 Referring MD:  Date of Birth: 1966-02-28 Gender: Female Account #: [id number] Procedure:                Colonoscopy Indications:              High risk colon cancer surveillance: Personal                            history of colonic polyps - last exam 07/2015 -                            adenoma removed. Exam yesterday aborted due to poor                            prep, she has chronic constipation. Medicines:                Monitored Anesthesia Care Procedure:                Pre-Anesthesia Assessment:                           - Prior to the procedure, a History and Physical                            was performed, and patient medications and                            allergies were reviewed. The patient's tolerance of                            previous anesthesia was also reviewed. The risks                            and benefits of the procedure and the sedation                            options and risks were discussed with the patient.                            All questions were answered, and informed consent                            was obtained. Prior Anticoagulants: The patient has                            taken Plavix (clopidogrel), last dose was 6 days                            prior to procedure. ASA Grade Assessment: III - A                            patient with severe systemic disease. After  reviewing the risks and benefits, the patient was                            deemed in satisfactory condition to undergo the                            procedure.                           After obtaining informed consent, the colonoscope                            was passed under direct vision. Throughout the                            procedure, the patient's blood pressure,  pulse, and                            oxygen saturations were monitored continuously. The                            Olympus PCF-H190DL (#[id number]) Colonoscope was                            introduced through the anus and advanced to the the                            cecum, identified by appendiceal orifice and                            ileocecal valve. The colonoscopy was performed                            without difficulty. The patient tolerated the                            procedure well. Scope In: 11:31:49 AM Scope Out: 11:53:35 AM Scope Withdrawal Time: 0 hours 17 minutes 24 seconds  Total Procedure Duration: 0 hours 21 minutes 46 seconds  Findings:                 The perianal and digital rectal examinations were                            normal.                           A 3 mm polyp was found in the cecum. The polyp was                            sessile. The polyp was removed with a cold snare.                            Resection and retrieval were complete.  Three sessile polyps were found in the sigmoid                            colon. The polyps were 3 to 4 mm in size. These                            polyps were removed with a cold snare. Resection                            and retrieval were complete.                           Internal hemorrhoids were found during retroflexion.                           Residual seeds / nuts were found in the entire                            colon, worst in dependant portions of the colon                            which took extensive lavage to clear and clogged                            the scope a few times.                           The exam was otherwise without abnormality. Complications:            No immediate complications. Estimated blood loss:                            Minimal. Estimated Blood Loss:     Estimated blood loss was minimal. Impression:               - One 3 mm polyp in the  cecum, removed with a cold                            snare. Resected and retrieved.                           - Three 3 to 4 mm polyps in the sigmoid colon,                            removed with a cold snare. Resected and retrieved.                           - Internal hemorrhoids.                           - The examination was otherwise normal.                           - The GI Genius (intelligent endoscopy module),  computer-aided polyp detection system powered by AI                            was utilized to detect colorectal polyps through                            enhanced visualization during colonoscopy. Recommendation:           - Patient has a contact number available for                            emergencies. The signs and symptoms of potential                            delayed complications were discussed with the                            patient. Return to normal activities tomorrow.                            Written discharge instructions were provided to the                            patient.                           - Resume previous diet.                           - Continue present medications.                           - Resume Plavix tomorrow.                           - Await pathology results.                           - Recommend double prep for future colonoscopy exams Kennth Vanbenschoten P. Toneshia Coello, MD 05/20/2023 12:02:52 PM This report has been signed electronically.

## 2023-05-20 NOTE — Progress Notes (Signed)
Uneventful anesthetic. Report to pacu rn. Vss. Care resumed by rn. 

## 2023-05-20 NOTE — Patient Instructions (Signed)
Thank you for coming in to see [place] today. Resume diet and medications today.  Resume Plavix tomorrow. Return to regular daily activities tomorrow. Biopsy results will be available in 1-2 weeks and recommendation will be made at that time for future colonoscopy.    YOU HAD AN ENDOSCOPIC PROCEDURE TODAY AT THE [facility]:   Refer to the procedure report that was given to you for any specific questions about what was found during the examination.  If the procedure report does not answer your questions, please call your gastroenterologist to clarify.  If you requested that your care partner not be given the details of your procedure findings, then the procedure report has been included in a sealed envelope for you to review at your convenience later.  YOU SHOULD EXPECT: Some feelings of bloating in the abdomen. Passage of more gas than usual.  Walking can help get rid of the air that was put into your GI tract during the procedure and reduce the bloating. If you had a lower endoscopy (such as a colonoscopy or flexible sigmoidoscopy) you may notice spotting of blood in your stool or on the toilet paper. If you underwent a bowel prep for your procedure, you may not have a normal bowel movement for a few days.  Please Note:  You might notice some irritation and congestion in your nose or some drainage.  This is from the oxygen used during your procedure.  There is no need for concern and it should clear up in a day or so.  SYMPTOMS TO REPORT IMMEDIATELY:  Following lower endoscopy (colonoscopy or flexible sigmoidoscopy):  Excessive amounts of blood in the stool  Significant tenderness or worsening of abdominal pains  Swelling of the abdomen that is new, acute  Fever of 100F or higher    For urgent or emergent issues, a gastroenterologist can be reached at any hour by calling [phone]. Do not use MyChart messaging for urgent concerns.    DIET:  We do recommend a small meal at  first, but then you may proceed to your regular diet.  Drink plenty of fluids but you should avoid alcoholic beverages for 24 hours.  ACTIVITY:  You should plan to take it easy for the rest of today and you should NOT DRIVE or use heavy machinery until tomorrow (because of the sedation medicines used during the test).    FOLLOW UP: Our staff will call the number listed on your records the next business day following your procedure.  We will call around 7:15- 8:00 am to check on you and address any questions or concerns that you may have regarding the information given to you following your procedure. If we do not reach you, we will leave a message.     If any biopsies were taken you will be contacted by phone or by letter within the next 1-3 weeks.  Please call us at [phone] if you have not heard about the biopsies in 3 weeks.    SIGNATURES/CONFIDENTIALITY: You and/or your care partner have signed paperwork which will be entered into your electronic medical record.  These signatures attest to the fact that that the information above on your After Visit Summary has been reviewed and is understood.  Full responsibility of the confidentiality of this discharge information lies with you and/or your care-partner.

## 2023-05-20 NOTE — Progress Notes (Signed)
[facility] History and Physical   Primary Care Physician:  Alveria Apley, NP (Inactive)   Reason for Procedure:   History of colon polyps  Plan:    colonoscopy     HPI: Andrea [name] is a 57 y.o. female  here for colonoscopy surveillance - adenoma removed 07/2015. Exam done yesterday with Dr. Lavon Paganini but aborted due to poor prep. She took more prep and here for another attempt at colonoscopy. On Plavix which has been held for 6 days now. She feels second prep cleared her out well enough. Has chronic constipation. No family history of colon cancer known. Otherwise feels well without any cardiopulmonary symptoms.   I have discussed risks / benefits of anesthesia and endoscopic procedure with Myrle Sheng and they wish to proceed with the exams as outlined today.    Past Medical History:  Diagnosis Date   Allergy    Anemia    Anxiety    on Alprazolam   Arthritis    both hips   Bilateral carotid artery stenosis 10/18/2014   Cancer (HCC)    skin cancer   Carotid artery occlusion    a. s/p prior endarterectomies with known RICA occlusion - followed by [facility].   CVA (cerebral infarction) 06/22/2014   Depression    on Effexor   Diabetes mellitus with circulatory complication (HCC)    2011   Diastolic dysfunction without heart failure    DM (diabetes mellitus) with complications (HCC) 08/29/2014   Heart murmur    was seen by cardiologist in [facility] 2013   History of kidney stones    HLD (hyperlipidemia) 08/29/2014   HTN (hypertension), benign 08/29/2014   Hyperlipidemia    Hypertension    Occlusion and stenosis of carotid artery without mention of cerebral infarction 04/12/2014   PVD (peripheral vascular disease) (HCC)    a. s/p prior LE bypass grafting and revision (followed by [facility]).   Shingles 06/07/2016   Stroke (HCC) 06/22/2014   TIA (transient ischemic attack)    a. possible mini stroke in 2015 - was told she did not have full stroke.    Past  Surgical History:  Procedure Laterality Date   2 stents     ABDOMINAL AORTOGRAM W/LOWER EXTREMITY N/A 01/20/2019   Procedure: ABDOMINAL AORTOGRAM W/LOWER EXTREMITY;  Surgeon: Cephus Shelling, MD;  Location: [facility];  Service: Cardiovascular;  Laterality: N/A;   CAROTID ENDARTERECTOMY     CESAREAN SECTION     CHOLECYSTECTOMY     EXTRACORPOREAL SHOCK WAVE LITHOTRIPSY Left 03/29/2018   Procedure: LEFT EXTRACORPOREAL SHOCK WAVE LITHOTRIPSY (ESWL);  Surgeon: Marcine Matar, MD;  Location: [facility];  Service: Urology;  Laterality: Left;   FEMORAL BYPASS     x 2 on right side due to re-stenosis   left/right carotid artery     2003 and 2009 for right, right side is 100% occluded per patient; left side 2009   TUBAL LIGATION      Prior to Admission medications   Medication Sig Start Date End Date Taking? Authorizing Provider  amLODipine (NORVASC) 10 MG tablet Take 1 tablet (10 mg total) by mouth daily. 11/10/18  Yes Benjiman Core D, PA-C  aspirin 81 MG tablet Take 1 tablet (81 mg total) by mouth daily. 04/27/17  Yes Dunn, Dayna N, PA-C  lisinopril (PRINIVIL,ZESTRIL) 2.5 MG tablet Take 1 tablet (2.5 mg total) by mouth daily. 11/10/18  Yes Barnett Abu, [place] D, PA-C  metFORMIN (GLUCOPHAGE) 1000 MG tablet Take 1 tablet (  1,000 mg total) by mouth 2 (two) times daily with a meal. 11/10/18  Yes Barnett Abu, [place] D, PA-C  omeprazole (PRILOSEC) 40 MG capsule Take 1 capsule (40 mg total) by mouth daily. 04/09/23  Yes Esterwood, Amy S, PA-C  simvastatin (ZOCOR) 20 MG tablet Take 1 tablet (20 mg total) by mouth at bedtime. 07/05/18  Yes Magdalene River, PA-C  TRELEGY ELLIPTA 200-62.5-25 MCG/ACT AEPB Inhale into the lungs. 12/09/22  Yes [provider]  venlafaxine XR (EFFEXOR-XR) 150 MG 24 hr capsule Take 150 mg by mouth daily with breakfast.   Yes [provider]  cetirizine (ZYRTEC) 10 MG tablet Take 10 mg by mouth as needed for allergies.    [provider]   clopidogrel (PLAVIX) 75 MG tablet Take 1 tablet (75 mg total) by mouth daily. 11/10/18   Benjiman Core D, PA-C  Evolocumab (REPATHA SURECLICK) 140 MG/ML SOAJ Inject 140 mg into the skin every 14 (fourteen) days. 08/13/21   Nahser, Deloris Ping, MD  FARXIGA 10 MG TABS tablet Take by mouth. Patient not taking: Reported on 05/19/2023 03/04/23   [provider]  fluconazole (DIFLUCAN) 100 MG tablet Take 1 tablet (100 mg total) by mouth daily. Patient not taking: Reported on 05/20/2023 05/19/23   Napoleon Form, MD  fluticasone (FLONASE) 50 MCG/ACT nasal spray Place 2 sprays into both nostrils as needed for allergies or rhinitis.    [provider]  Lancets MISC Test blood sugar once daily. 06/09/18   Magdalene River, PA-C    Current Outpatient Medications  Medication Sig Dispense Refill   amLODipine (NORVASC) 10 MG tablet Take 1 tablet (10 mg total) by mouth daily. 90 tablet 3   aspirin 81 MG tablet Take 1 tablet (81 mg total) by mouth daily. 30 tablet 11   lisinopril (PRINIVIL,ZESTRIL) 2.5 MG tablet Take 1 tablet (2.5 mg total) by mouth daily. 90 tablet 3   metFORMIN (GLUCOPHAGE) 1000 MG tablet Take 1 tablet (1,000 mg total) by mouth 2 (two) times daily with a meal. 180 tablet 3   omeprazole (PRILOSEC) 40 MG capsule Take 1 capsule (40 mg total) by mouth daily. 30 capsule 4   simvastatin (ZOCOR) 20 MG tablet Take 1 tablet (20 mg total) by mouth at bedtime. 90 tablet 3   TRELEGY ELLIPTA 200-62.5-25 MCG/ACT AEPB Inhale into the lungs.     venlafaxine XR (EFFEXOR-XR) 150 MG 24 hr capsule Take 150 mg by mouth daily with breakfast.     cetirizine (ZYRTEC) 10 MG tablet Take 10 mg by mouth as needed for allergies.     clopidogrel (PLAVIX) 75 MG tablet Take 1 tablet (75 mg total) by mouth daily. 90 tablet 3   Evolocumab (REPATHA SURECLICK) 140 MG/ML SOAJ Inject 140 mg into the skin every 14 (fourteen) days. 6 mL 3   FARXIGA 10 MG TABS tablet Take by mouth. (Patient not taking:  Reported on 05/19/2023)     fluconazole (DIFLUCAN) 100 MG tablet Take 1 tablet (100 mg total) by mouth daily. (Patient not taking: Reported on 05/20/2023) 28 tablet 0   fluticasone (FLONASE) 50 MCG/ACT nasal spray Place 2 sprays into both nostrils as needed for allergies or rhinitis.     Lancets MISC Test blood sugar once daily. 100 each 2   Current Facility-Administered Medications  Medication Dose Route Frequency Provider Last Rate Last Admin   0.9 %  sodium chloride infusion  500 mL Intravenous Continuous Ben Habermann, Willaim Rayas, MD        Allergies  as of 05/20/2023 - Review Complete 05/20/2023  Allergen Reaction Noted   Atorvastatin  07/26/2018   Iohexol Hives 04/13/2020   Other Hives 03/10/2014   Rosuvastatin Nausea And Vomiting 07/26/2018    Family History  Problem Relation Age of Onset   Diabetes Mother    Heart disease Mother    Hyperlipidemia Mother    Hypertension Mother    Kidney failure Mother    Heart disease Father        MI, unknown age   Hyperlipidemia Father    Hypertension Father    Skin cancer Sister    Varicose Veins Sister    Heart attack Brother        Age 8   Diabetes Maternal Grandmother    Heart disease Maternal Grandmother    Hyperlipidemia Maternal Grandmother    Cancer Paternal Grandmother        unknown type- possibly stomach   Liver disease Son        fatty liver   Colon cancer Neg Hx    Esophageal cancer Neg Hx     Social History   Socioeconomic History   Marital status: Married    Spouse name: Not on file   Number of children: 1   Years of education: College   Highest education level: Not on file  Occupational History   Occupation: retired/disabled    Associate Professor: UNEMPLOYED  Tobacco Use   Smoking status: Every Day    Packs/day: 1.00    Years: 40.00    Additional pack years: 0.00    Total pack years: 40.00    Types: Cigarettes   Smokeless tobacco: Never  Vaping Use   Vaping Use: Never used  Substance and Sexual Activity    Alcohol use: Yes    Alcohol/week: 0.0 standard drinks of alcohol    Comment: holidays/special occas 3-4 drinks   Drug use: No   Sexual activity: Not Currently    Partners: Male  Other Topics Concern   Not on file  Social History Narrative   Pt is from [facility]   Patient lives at home with her husband and son   Patient is right handed   Patient drinks coffee daily   Social Determinants of Health   Financial Resource Strain: Not on file  Food Insecurity: Not on file  Transportation Needs: Not on file  Physical Activity: Inactive (07/05/2018)   Exercise Vital Sign    Days of Exercise per Week: 0 days    Minutes of Exercise per Session: 0 min  Stress: Stress Concern Present (07/05/2018)   [employer] of Occupational Health - Occupational Stress Questionnaire    Feeling of Stress : To some extent  Social Connections: Socially Integrated (07/05/2018)   Social Connection and Isolation Panel [NHANES]    Frequency of Communication with Friends and Family: More than three times a week    Frequency of Social Gatherings with Friends and Family: Twice a week    Attends Religious Services: 1 to 4 times per year    Active Member of [employer] or Organizations: Yes    Attends Banker Meetings: 1 to 4 times per year    Marital Status: Married  Catering manager Violence: Not At Risk (07/05/2018)   Humiliation, Afraid, Rape, and Kick questionnaire    Fear of Current or Ex-Partner: No    Emotionally Abused: No    Physically Abused: No    Sexually Abused: No    Review of Systems: All other review of  systems negative except as mentioned in the HPI.  Physical Exam: Vital signs BP (!) 138/57   Pulse 72   Temp 98.3 F (36.8 C) (Temporal)   Ht 5\' 3"  (1.6 m)   Wt 129 lb (58.5 kg)   SpO2 97%   BMI 22.85 kg/m   General:   Alert,  Well-developed, pleasant and cooperative in NAD Lungs:  Clear throughout to auscultation.   Heart:  Regular rate and rhythm Abdomen:  Soft,  nontender and nondistended.   Neuro/Psych:  Alert and cooperative. Normal mood and affect. A and O x 3  Harlin Rain, MD [facility]

## 2023-05-21 ENCOUNTER — Telehealth: Payer: Self-pay

## 2023-05-21 NOTE — Telephone Encounter (Signed)
  Follow up Call-     05/20/2023   10:42 AM 05/19/2023    2:00 PM  Call back number  Post procedure Call Back phone  # [phone]  Permission to leave phone message Yes Yes     Patient questions:  Do you have a fever, pain , or abdominal swelling? No. Pain Score  0 *  Have you tolerated food without any problems? Yes.    Have you been able to return to your normal activities? Yes.    Do you have any questions about your discharge instructions: Diet   No. Medications  No. Follow up visit  No.  Do you have questions or concerns about your Care? No.  Actions: * If pain score is 4 or above: No action needed, pain <4.

## 2023-06-01 ENCOUNTER — Encounter: Payer: Self-pay | Admitting: Gastroenterology

## 2023-06-04 ENCOUNTER — Encounter: Payer: Self-pay | Admitting: Gastroenterology

## 2023-06-05 ENCOUNTER — Other Ambulatory Visit: Payer: Self-pay | Admitting: *Deleted

## 2023-06-05 DIAGNOSIS — I739 Peripheral vascular disease, unspecified: Secondary | ICD-10-CM

## 2023-06-05 IMAGING — CT CT ANGIO AOBIFEM WO/W CM
1 of 3 series · 11 of 32 positions shown, 16 images · IV contrast (APPLIED)
Comparison: 04/13/2020

CLINICAL DATA: 55-year-old female with peripheral vascular disease,
history of right to left femoral femoral bypass, now with calf
claudication.

EXAM:
CT ANGIOGRAPHY OF ABDOMINAL AORTA WITH ILIOFEMORAL RUNOFF
TECHNIQUE: Multidetector CT imaging of the abdomen, pelvis and lower
extremities was performed using the standard protocol during bolus
administration of intravenous contrast. Multiplanar CT image
reconstructions and MIPs were obtained to evaluate the vascular
anatomy.
CONTRAST:  100mL [id number] IOPAMIDOL ([id number]) INJECTION 76%

[Series 7: angioao-bifem 3mm · axial · 0.81mm/px · z∈[-1016,-2]mm · 11 of 396 slices shown, 16 images]
[im 29/396  soft-tissue]
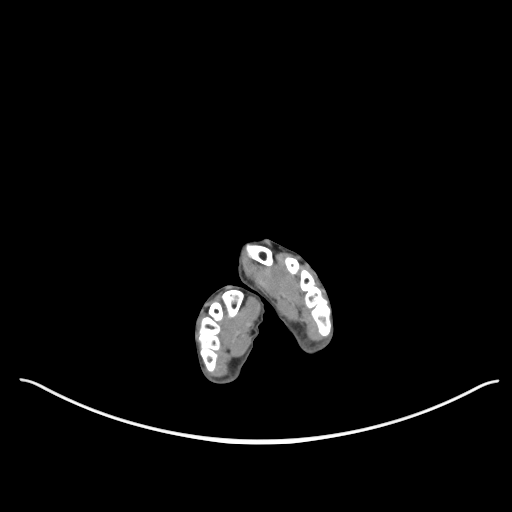
[im 29/396  bone]
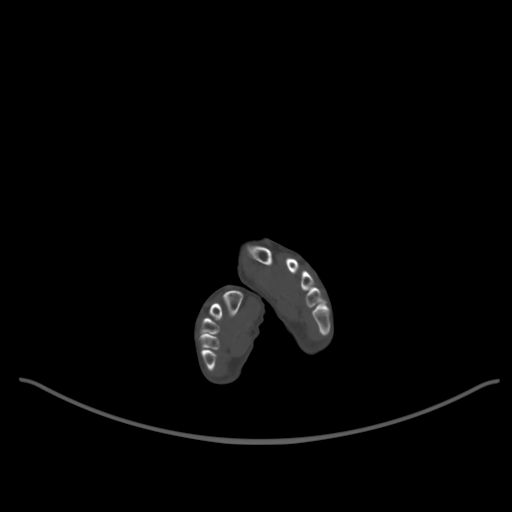
[im 57/396  soft-tissue]
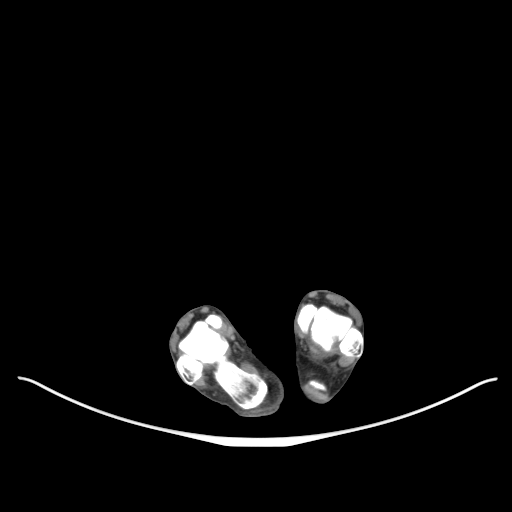
[im 113/396  soft-tissue]
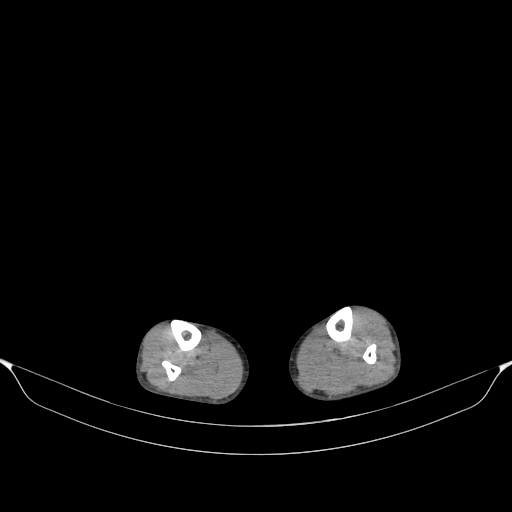
[im 142/396  soft-tissue]
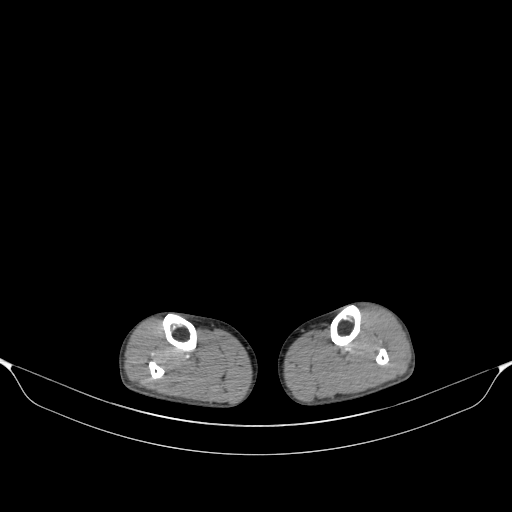
[im 170/396  soft-tissue]
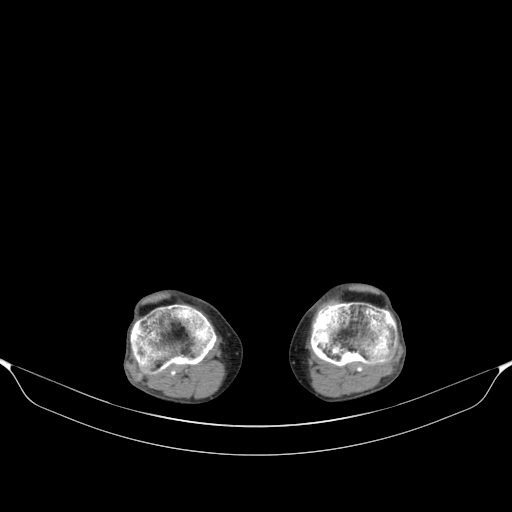
[im 226/396  soft-tissue]
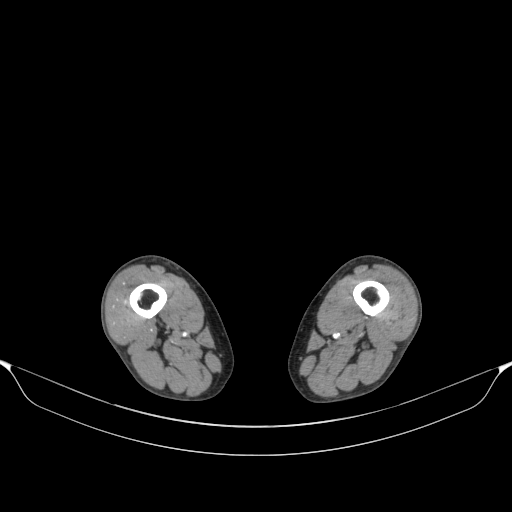
[im 254/396  soft-tissue]
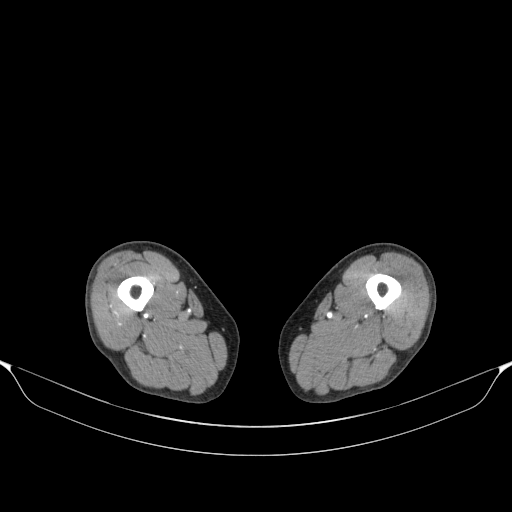
[im 283/396  soft-tissue]
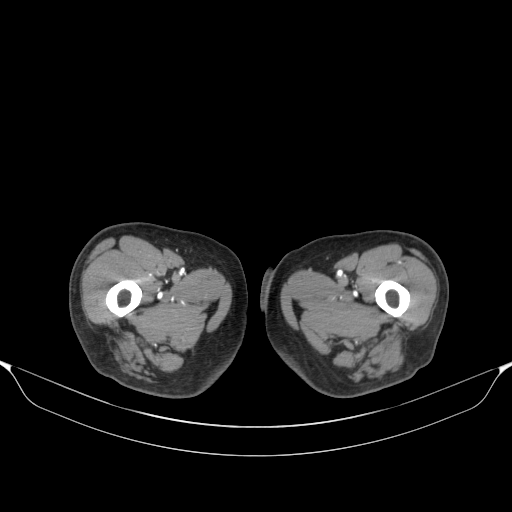
[im 283/396  lung]
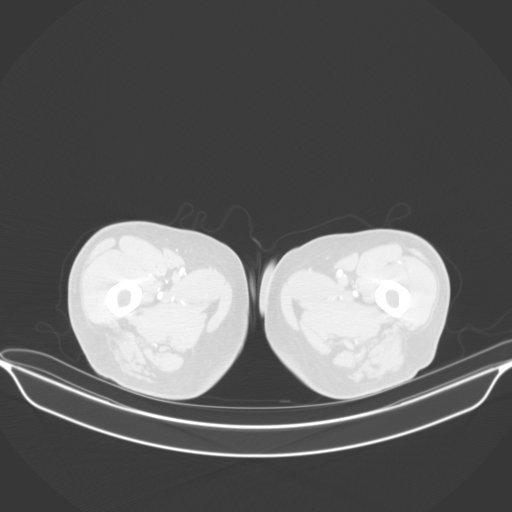
[im 311/396  lung]
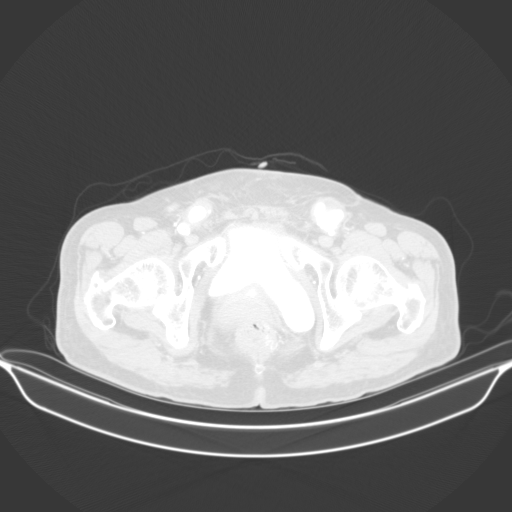
[im 339/396  soft-tissue]
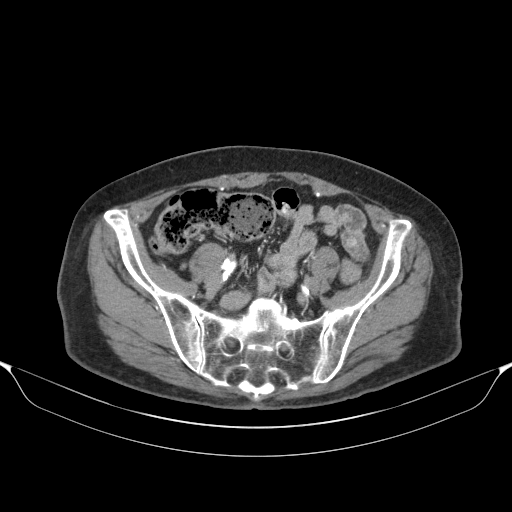
[im 339/396  lung]
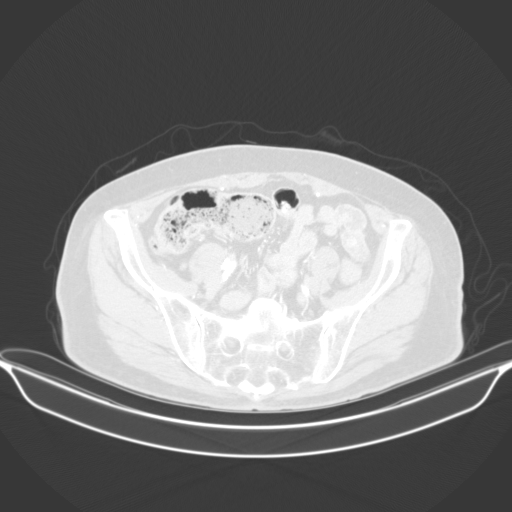
[im 339/396  bone]
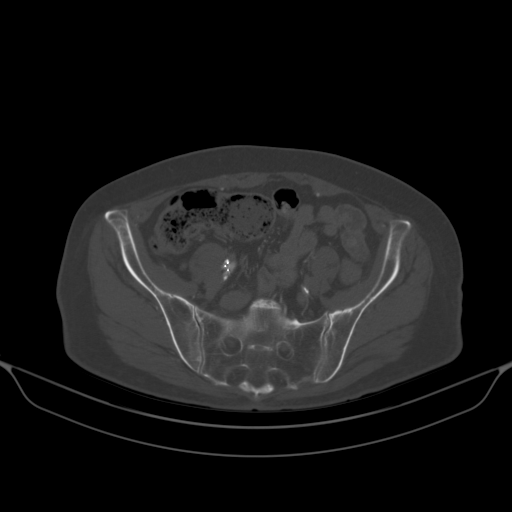
[im 367/396  soft-tissue]
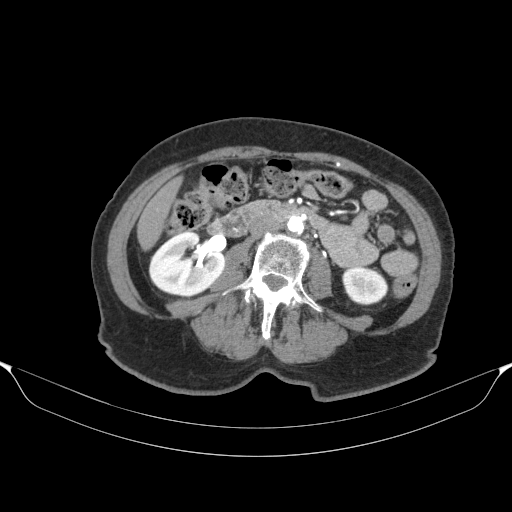
[im 367/396  lung]
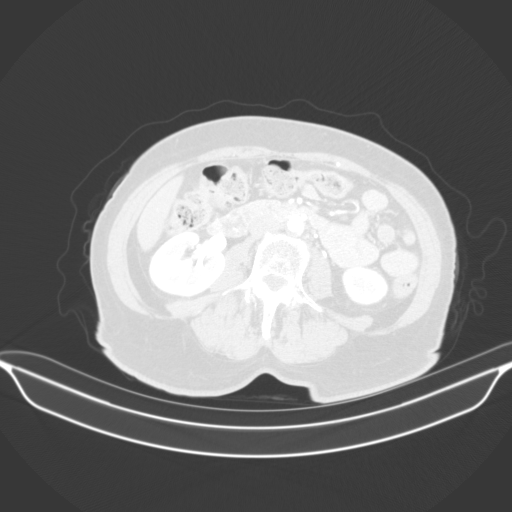

[11 of 32 positions shown; findings below may reference images not displayed]

FINDINGS: VASCULAR

Aorta: Similar-appearing extensive circumferential fibrofatty and
calcific atherosclerotic changes, most prominent just prior to the
bifurcation. Patent throughout.

Celiac: Not well visualized.

SMA: Similar-appearing moderate ostial stenosis secondary to
atherosclerotic plaque. Patent distally.

Renals: Single renal arteries bilaterally. Mild ostial stenosis of
the right renal artery secondary to atherosclerotic plaque. Severe
ostial stenosis of the left renal artery secondary to
atherosclerotic plaque.

IMA: Moderate ostial stenosis secondary to atherosclerotic plaque.

RIGHT Lower Extremity

Inflow: Patent indwelling common iliac stent. Severe ostial stenosis
of the internal iliac artery. Patent indwelling proximal right
external iliac stent.

Outflow: The common femoral artery is patent throughout. Arising
from the distal common femoral artery is a right to left femoral
femoral bypass graft. The graft is patent throughout with
similar-appearing at least moderate proximal anastomotic stenosis
and aneurysmal degeneration about the left femoral anastomosis with
associated at least moderate stenosis secondary to atherosclerotic
plaque formation. The profundal is patent. There is
similar-appearing proximal occlusion with atherosclerotic
calcification of the superficial femoral artery. There is distal
reconstitution in the mid femoral artery to a diminutive distal
superficial femoral artery. The popliteal artery is patent.

Runoff: Poor opacification of the runoff vessels.

LEFT Lower Extremity

Inflow: Occluded throughout.

Outflow: The distal common femoral artery is patent but diminutive
with heavy atherosclerotic calcification just distal to the femoral
femoral bypass anastomosis. The profundus patent. Multifocal mild
stenosis throughout the otherwise patent superficial femoral artery.
The popliteal artery is patent.

Runoff: Poor opacification of the runoff vessels.

Veins: No obvious venous abnormality within the limitations of this
arterial phase study.

Review of the MIP images confirms the above findings.

NON-VASCULAR

Lower chest: No acute abnormality.

Hepatobiliary: Incompletely visualized. No focal liver abnormality
is seen. Status post cholecystectomy. No biliary dilatation.

Pancreas: Incompletely visualized. No pancreatic ductal dilatation
or surrounding inflammatory changes.

Spleen: Not included this study.

Adrenals/Urinary Tract: Visualized adrenal glands are unremarkable.
Kidneys are normal, without renal calculi, focal lesion, or
hydronephrosis. Bladder is unremarkable.

Stomach/Bowel: The visualized stomach is within normal limits.
Appendix appears normal. No evidence of bowel wall thickening,
distention, or inflammatory changes.

Lymphatic: No abdominopelvic lymphadenopathy.

Reproductive: Uterus and bilateral adnexa are unremarkable.

Other: No abdominal wall hernia or abnormality. No abdominopelvic
ascites.

Musculoskeletal: No acute osseous abnormality. Similar-appearing
serpiginous sclerotic changes about the bilateral femoral heads, no
evidence of collapse.
IMPRESSION: VASCULAR

1. Patent indwelling right common and right external iliac artery
stents. Chronically occluded left inflow vessels.
2. Patent indwelling right to left femoral femoral bypass with at
least moderate proximal anastomotic stenosis and severe distal
anastomotic stenosis.
3. Proximal right superficial femoral artery short-segment occlusion
with coarse atherosclerotic calcifications. The remaining outflow
vessels are patent. The right runoff vessels are poorly visualized,
likely due to contrast bolus timing.
4. Patent left outflow vessels. The left runoff vessels are poorly
visualized, likely due to contrast bolus timing.

NON-VASCULAR

1. No acute abdominopelvic abnormality.
2. Similar appearing changes of uncomplicated avascular necrosis of
the bilateral femoral heads.

## 2023-06-16 ENCOUNTER — Encounter: Payer: Self-pay | Admitting: Vascular Surgery

## 2023-06-16 ENCOUNTER — Ambulatory Visit (INDEPENDENT_AMBULATORY_CARE_PROVIDER_SITE_OTHER)
Admission: RE | Admit: 2023-06-16 | Discharge: 2023-06-16 | Disposition: A | Discharge status: Home / Self Care | Payer: Medicare PPO | Source: Ambulatory Visit | Attending: Vascular Surgery | Admitting: Vascular Surgery

## 2023-06-16 ENCOUNTER — Ambulatory Visit: Payer: Medicare PPO | Admitting: Vascular Surgery

## 2023-06-16 ENCOUNTER — Ambulatory Visit (HOSPITAL_COMMUNITY)
Admission: RE | Admit: 2023-06-16 | Discharge: 2023-06-16 | Disposition: A | Discharge status: Home / Self Care | Payer: Medicare PPO | Source: Ambulatory Visit | Attending: Vascular Surgery | Admitting: Vascular Surgery

## 2023-06-16 VITALS — BP 123/66 | HR 64 | Temp 97.6°F | Wt 126.0 lb

## 2023-06-16 DIAGNOSIS — I724 Aneurysm of artery of lower extremity: Secondary | ICD-10-CM

## 2023-06-16 DIAGNOSIS — I739 Peripheral vascular disease, unspecified: Secondary | ICD-10-CM | POA: Diagnosis present

## 2023-06-16 DIAGNOSIS — Z95828 Presence of other vascular implants and grafts: Secondary | ICD-10-CM

## 2023-06-16 HISTORY — DX: Presence of other vascular implants and grafts: Z95.828

## 2023-06-16 HISTORY — DX: Aneurysm of artery of lower extremity: I72.4

## 2023-06-16 LAB — VAS US ABI WITH/WO TBI
Left ABI: 0.6
Right ABI: 0.53

## 2023-06-16 NOTE — Progress Notes (Signed)
Patient name: Andrea [name] MRN: [id number] DOB: [birth year] Sex: female  REASON FOR CONSULT: 6 month follow-up for surveillance fem fem bypass and carotid artery disease   HPI: LAJUANNA POMPA is a 57 y.o. female, with history of multiple medical issues including tobacco abuse that presents for 6 month follow-up for surveillance of fem fem bypass and carotid artery disease.    Patient initially underwent right to left femorofemoral bypass by Dr. Madilyn Fireman in 2003 using 7 mm Dacron.  She had to have a revision later with bilateral femoral patch angioplasty in 2005. She has also undergone a right common iliac artery stent here by Dr. Madilyn Fireman and another iliac stent at [facility].     She was previously seen for follow-up with recurrent bilateral lower extremity claudication.  I sent her for CTA to evaluate for aortic disease versus iliac disease in stents versus an issue with her femorofemoral bypass.  Ultimately we elected to delay intervention and continue surveillance.  She states about 3 months ago he noticed mass in her left groin that has been growing.  She has pain in the left groin now.  No stroke symptoms since last evaluation.  Past Medical History:  Diagnosis Date   Allergy    Anemia    Anxiety    on Alprazolam   Arthritis    both hips   Bilateral carotid artery stenosis 10/18/2014   Cancer (HCC)    skin cancer   Carotid artery occlusion    a. s/p prior endarterectomies with known RICA occlusion - followed by [facility].   CVA (cerebral infarction) 06/22/2014   Depression    on Effexor   Diabetes mellitus with circulatory complication (HCC)    2011   Diastolic dysfunction without heart failure    DM (diabetes mellitus) with complications (HCC) 08/29/2014   Heart murmur    was seen by cardiologist in [facility] 2013   History of kidney stones    HLD (hyperlipidemia) 08/29/2014   HTN (hypertension), benign 08/29/2014   Hyperlipidemia    Hypertension    Occlusion and stenosis of  carotid artery without mention of cerebral infarction 04/12/2014   PVD (peripheral vascular disease) (HCC)    a. s/p prior LE bypass grafting and revision (followed by [facility]).   Shingles 06/07/2016   Stroke (HCC) 06/22/2014   TIA (transient ischemic attack)    a. possible mini stroke in 2015 - was told she did not have full stroke.    Past Surgical History:  Procedure Laterality Date   2 stents     ABDOMINAL AORTOGRAM W/LOWER EXTREMITY N/A 01/20/2019   Procedure: ABDOMINAL AORTOGRAM W/LOWER EXTREMITY;  Surgeon: Cephus Shelling, MD;  Location: [facility];  Service: Cardiovascular;  Laterality: N/A;   CAROTID ENDARTERECTOMY     CESAREAN SECTION     CHOLECYSTECTOMY     EXTRACORPOREAL SHOCK WAVE LITHOTRIPSY Left 03/29/2018   Procedure: LEFT EXTRACORPOREAL SHOCK WAVE LITHOTRIPSY (ESWL);  Surgeon: Marcine Matar, MD;  Location: [facility];  Service: Urology;  Laterality: Left;   FEMORAL BYPASS     x 2 on right side due to re-stenosis   left/right carotid artery     2003 and 2009 for right, right side is 100% occluded per patient; left side 2009   TUBAL LIGATION      Family History  Problem Relation Age of Onset   Diabetes Mother    Heart disease Mother    Hyperlipidemia Mother    Hypertension Mother  Kidney failure Mother    Heart disease Father        MI, unknown age   Hyperlipidemia Father    Hypertension Father    Skin cancer Sister    Varicose Veins Sister    Heart attack Brother        Age 76   Diabetes Maternal Grandmother    Heart disease Maternal Grandmother    Hyperlipidemia Maternal Grandmother    Cancer Paternal Grandmother        unknown type- possibly stomach   Liver disease Son        fatty liver   Colon cancer Neg Hx    Esophageal cancer Neg Hx     SOCIAL HISTORY: Social History   Socioeconomic History   Marital status: Married    Spouse name: Not on file   Number of children: 1   Years of education: College   Highest education level: Not on  file  Occupational History   Occupation: retired/disabled    Associate Professor: UNEMPLOYED  Tobacco Use   Smoking status: Every Day    Packs/day: 1.00    Years: 40.00    Additional pack years: 0.00    Total pack years: 40.00    Types: Cigarettes   Smokeless tobacco: Never  Vaping Use   Vaping Use: Never used  Substance and Sexual Activity   Alcohol use: Yes    Alcohol/week: 0.0 standard drinks of alcohol    Comment: holidays/special occas 3-4 drinks   Drug use: No   Sexual activity: Not Currently    Partners: Male  Other Topics Concern   Not on file  Social History Narrative   Pt is from [facility]   Patient lives at home with her husband and son   Patient is right handed   Patient drinks coffee daily   Social Determinants of Health   Financial Resource Strain: Not on file  Food Insecurity: Not on file  Transportation Needs: Not on file  Physical Activity: Inactive (07/05/2018)   Exercise Vital Sign    Days of Exercise per Week: 0 days    Minutes of Exercise per Session: 0 min  Stress: Stress Concern Present (07/05/2018)   [employer] of Occupational Health - Occupational Stress Questionnaire    Feeling of Stress : To some extent  Social Connections: Socially Integrated (07/05/2018)   Social Connection and Isolation Panel [NHANES]    Frequency of Communication with Friends and Family: More than three times a week    Frequency of Social Gatherings with Friends and Family: Twice a week    Attends Religious Services: 1 to 4 times per year    Active Member of [employer] or Organizations: Yes    Attends Banker Meetings: 1 to 4 times per year    Marital Status: Married  Catering manager Violence: Not At Risk (07/05/2018)   Humiliation, Afraid, Rape, and Kick questionnaire    Fear of Current or Ex-Partner: No    Emotionally Abused: No    Physically Abused: No    Sexually Abused: No    Allergies  Allergen Reactions   Atorvastatin     Muscle cramps   Iohexol  Hives    Patient should take 13 hours prep    Other Hives    Contrast dye    Rosuvastatin Nausea And Vomiting    Nausea/vomiting    Current Outpatient Medications  Medication Sig Dispense Refill   amLODipine (NORVASC) 10 MG tablet Take 1 tablet (10 mg total)  by mouth daily. 90 tablet 3   aspirin 81 MG tablet Take 1 tablet (81 mg total) by mouth daily. 30 tablet 11   cetirizine (ZYRTEC) 10 MG tablet Take 10 mg by mouth as needed for allergies.     clopidogrel (PLAVIX) 75 MG tablet Take 1 tablet (75 mg total) by mouth daily. 90 tablet 3   Evolocumab (REPATHA SURECLICK) 140 MG/ML SOAJ Inject 140 mg into the skin every 14 (fourteen) days. 6 mL 3   FARXIGA 10 MG TABS tablet Take by mouth. (Patient not taking: Reported on 05/19/2023)     fluconazole (DIFLUCAN) 100 MG tablet Take 1 tablet (100 mg total) by mouth daily. (Patient not taking: Reported on 05/20/2023) 28 tablet 0   fluticasone (FLONASE) 50 MCG/ACT nasal spray Place 2 sprays into both nostrils as needed for allergies or rhinitis.     Lancets MISC Test blood sugar once daily. 100 each 2   lisinopril (PRINIVIL,ZESTRIL) 2.5 MG tablet Take 1 tablet (2.5 mg total) by mouth daily. 90 tablet 3   metFORMIN (GLUCOPHAGE) 1000 MG tablet Take 1 tablet (1,000 mg total) by mouth 2 (two) times daily with a meal. 180 tablet 3   omeprazole (PRILOSEC) 40 MG capsule Take 1 capsule (40 mg total) by mouth daily. 30 capsule 4   simvastatin (ZOCOR) 20 MG tablet Take 1 tablet (20 mg total) by mouth at bedtime. 90 tablet 3   TRELEGY ELLIPTA 200-62.5-25 MCG/ACT AEPB Inhale into the lungs.     venlafaxine XR (EFFEXOR-XR) 150 MG 24 hr capsule Take 150 mg by mouth daily with breakfast.     Current Facility-Administered Medications  Medication Dose Route Frequency Provider Last Rate Last Admin   0.9 %  sodium chloride infusion  500 mL Intravenous Continuous Armbruster, Willaim Rayas, MD        REVIEW OF SYSTEMS:  [X]  denotes positive finding, [ ]  denotes negative  finding Cardiac  Comments:  Chest pain or chest pressure:    Shortness of breath upon exertion:    Short of breath when lying flat:    Irregular heart rhythm:        Vascular    Pain in calf, thigh, or hip brought on by ambulation:    Pain in feet at night that wakes you up from your sleep:     Blood clot in your veins:    Leg swelling:         Pulmonary    Oxygen at home:    Productive cough:     Wheezing:         Neurologic    Sudden weakness in arms or legs:     Sudden numbness in arms or legs:     Sudden onset of difficulty speaking or slurred speech:    Temporary loss of vision in one eye:     Problems with dizziness:         Gastrointestinal    Blood in stool:     Vomited blood:         Genitourinary    Burning when urinating:     Blood in urine:        Psychiatric    Major depression:         Hematologic    Bleeding problems:    Problems with blood clotting too easily:        Skin    Rashes or ulcers:        Constitutional    Fever or chills:  PHYSICAL EXAM: There were no vitals filed for this visit.   GENERAL: The patient is a well-nourished female, in no acute distress. The vital signs are documented above. CARDIAC: There is a regular rate and rhythm.  VASCULAR:  Palpable pulse in fem fem bypass graft Pulsatile mass left groin No palpable pedal pulses but no tissue loss PULMONARY: No respiratory distress. ABDOMEN: Soft and non-tender. MUSCULOSKELETAL: There are no major deformities or cyanosis. NEUROLOGIC: No focal weakness or paresthesias are detected.    DATA:   ABIs today are 0.53 right and 0.60 left  Femorofemoral duplex today shows left groin hematoma which I suspect is a pseudoaneurysm  Carotid duplex today shows known right ICA occlusion with 40 to 59% left ICA stenosis  Assessment/Plan:   57 year old female presents for interval follow-up for surveillance of her right to left femoral-femoral bypass graft placed in 2003 by  Dr. Madilyn Fireman.  Her femoral femoral duplex today shows significant hematoma around the graft in the left groin and on exam she has a pulsatile mass here.  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  Her skin is still intact.  I will send for urgent CTA abdomen pelvis with runoff.  Discussed she will require operative invention in the left groin.  I will have her follow-up with me once the CTA is complete.  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  Her left ICA has 40 to 59% stenosis.  Will not pursue any further intervention at this time and will monitor for any recurrent high grade carotid disease in the future.Cephus Shelling, MD [facility] Office: [phone]  Cephus Shelling

## 2023-06-19 ENCOUNTER — Other Ambulatory Visit: Payer: Self-pay

## 2023-06-19 DIAGNOSIS — I724 Aneurysm of artery of lower extremity: Secondary | ICD-10-CM

## 2023-06-19 MED ORDER — PREDNISONE 50 MG PO TABS: ORAL_TABLET | ORAL | 0 refills | Status: DC

## 2023-06-19 MED ORDER — DIPHENHYDRAMINE HCL 50 MG PO CAPS: ORAL_CAPSULE | ORAL | 0 refills | Status: DC

## 2023-06-23 ENCOUNTER — Ambulatory Visit (HOSPITAL_BASED_OUTPATIENT_CLINIC_OR_DEPARTMENT_OTHER)
Admission: RE | Admit: 2023-06-23 | Discharge: 2023-06-23 | Disposition: A | Discharge status: Home / Self Care | Payer: Medicare PPO | Source: Ambulatory Visit | Attending: Vascular Surgery | Admitting: Vascular Surgery

## 2023-06-23 DIAGNOSIS — I724 Aneurysm of artery of lower extremity: Secondary | ICD-10-CM | POA: Diagnosis present

## 2023-06-23 LAB — POCT I-STAT CREATININE: Creatinine, Ser: 0.5 mg/dL (ref 0.44–1.00)

## 2023-06-23 MED ORDER — IOHEXOL 350 MG/ML SOLN
100.0000 mL | Freq: Once | INTRAVENOUS | Status: AC | PRN
  Administered 2023-06-23: 100 mL via INTRAVENOUS

## 2023-06-24 ENCOUNTER — Telehealth: Payer: Self-pay

## 2023-06-24 NOTE — Telephone Encounter (Signed)
Dr. Deanne Coffer called to verbally give CTA results, two identifiers used. 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  Msg sent to Dr. Chestine Spore for advice.

## 2023-06-30 ENCOUNTER — Encounter: Payer: Self-pay | Admitting: Vascular Surgery

## 2023-06-30 ENCOUNTER — Other Ambulatory Visit: Payer: Self-pay

## 2023-06-30 ENCOUNTER — Ambulatory Visit (INDEPENDENT_AMBULATORY_CARE_PROVIDER_SITE_OTHER): Payer: Medicare PPO | Admitting: Vascular Surgery

## 2023-06-30 VITALS — BP 145/65 | HR 68 | Temp 97.2°F | Resp 14 | Ht 63.0 in | Wt 126.0 lb

## 2023-06-30 DIAGNOSIS — I724 Aneurysm of artery of lower extremity: Secondary | ICD-10-CM

## 2023-06-30 DIAGNOSIS — Z95828 Presence of other vascular implants and grafts: Secondary | ICD-10-CM | POA: Diagnosis not present

## 2023-06-30 NOTE — Progress Notes (Signed)
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  Patient was seen several weeks ago for surveillance.  She states about 3 months ago she had noticed a mass in her left groin that has been growing.  Patient initially underwent right to left femorofemoral bypass by Dr. Madilyn Fireman in 2003 using 7 mm Dacron.  She had to have a revision later with bilateral femoral patch angioplasty in 2005. She has also undergone a right common iliac artery stent here by Dr. Madilyn Fireman and another iliac stent at [facility].     She was previously seen for follow-up with recurrent bilateral lower extremity claudication.  I sent her for CTA to evaluate for aortic disease versus iliac disease in stents versus an issue with her femorofemoral bypass.  Ultimately we elected to delay intervention and continue surveillance.    Past Medical History:  Diagnosis Date   Allergy    Anemia    Anxiety    on Alprazolam   Arthritis    both hips   Bilateral carotid artery stenosis 10/18/2014   Cancer (HCC)    skin cancer   Carotid artery occlusion    a. s/p prior endarterectomies with known RICA occlusion - followed by [facility].   CVA (cerebral infarction) 06/22/2014   Depression    on Effexor   Diabetes mellitus with circulatory complication (HCC)    2011   Diastolic dysfunction without heart failure    DM (diabetes mellitus) with complications (HCC) 08/29/2014   Heart murmur    was seen by cardiologist in [facility] 2013   History of kidney stones    HLD (hyperlipidemia) 08/29/2014   HTN (hypertension), benign  08/29/2014   Hyperlipidemia    Hypertension    Occlusion and stenosis of carotid artery without mention of cerebral infarction 04/12/2014   PVD (peripheral vascular disease) (HCC)    a. s/p prior LE bypass grafting and revision (followed by [facility]).   Shingles 06/07/2016   Stroke (HCC) 06/22/2014   TIA (transient ischemic attack)    a. possible mini stroke in 2015 - was told she did not have full stroke.    Past Surgical History:  Procedure Laterality Date   2 stents     ABDOMINAL AORTOGRAM W/LOWER EXTREMITY N/A 01/20/2019   Procedure: ABDOMINAL AORTOGRAM W/LOWER EXTREMITY;  Surgeon: Cephus Shelling, MD;  Location: [facility];  Service: Cardiovascular;  Laterality: N/A;   CAROTID ENDARTERECTOMY     CESAREAN SECTION     CHOLECYSTECTOMY     EXTRACORPOREAL SHOCK WAVE LITHOTRIPSY Left 03/29/2018   Procedure: LEFT EXTRACORPOREAL SHOCK WAVE LITHOTRIPSY (ESWL);  Surgeon: Marcine Matar, MD;  Location: [facility];  Service: Urology;  Laterality: Left;   FEMORAL BYPASS     x 2 on right side due to re-stenosis   left/right carotid artery     2003 and 2009 for right, right side is 100% occluded per patient; left side 2009   TUBAL LIGATION      Family History  Problem Relation Age of Onset   Diabetes Mother    Heart disease Mother    Hyperlipidemia Mother  Hypertension Mother    Kidney failure Mother    Heart disease Father        MI, unknown age   Hyperlipidemia Father    Hypertension Father    Skin cancer Sister    Varicose Veins Sister    Heart attack Brother        Age 59   Diabetes Maternal Grandmother    Heart disease Maternal Grandmother    Hyperlipidemia Maternal Grandmother    Cancer Paternal Grandmother        unknown type- possibly stomach   Liver disease Son        fatty liver   Colon cancer Neg Hx    Esophageal cancer Neg Hx     SOCIAL HISTORY: Social History   Socioeconomic History   Marital status: Married    Spouse name: Not on file   Number of  children: 1   Years of education: College   Highest education level: Not on file  Occupational History   Occupation: retired/disabled    Associate Professor: UNEMPLOYED  Tobacco Use   Smoking status: Every Day    Packs/day: 1.00    Years: 40.00    Additional pack years: 0.00    Total pack years: 40.00    Types: Cigarettes   Smokeless tobacco: Never  Vaping Use   Vaping Use: Never used  Substance and Sexual Activity   Alcohol use: Yes    Alcohol/week: 0.0 standard drinks of alcohol    Comment: holidays/special occas 3-4 drinks   Drug use: No   Sexual activity: Not Currently    Partners: Male  Other Topics Concern   Not on file  Social History Narrative   Pt is from [facility]   Patient lives at home with her husband and son   Patient is right handed   Patient drinks coffee daily   Social Determinants of Health   Financial Resource Strain: Not on file  Food Insecurity: Not on file  Transportation Needs: Not on file  Physical Activity: Inactive (07/05/2018)   Exercise Vital Sign    Days of Exercise per Week: 0 days    Minutes of Exercise per Session: 0 min  Stress: Stress Concern Present (07/05/2018)   [employer] of Occupational Health - Occupational Stress Questionnaire    Feeling of Stress : To some extent  Social Connections: Socially Integrated (07/05/2018)   Social Connection and Isolation Panel [NHANES]    Frequency of Communication with Friends and Family: More than three times a week    Frequency of Social Gatherings with Friends and Family: Twice a week    Attends Religious Services: 1 to 4 times per year    Active Member of [employer] or Organizations: Yes    Attends Banker Meetings: 1 to 4 times per year    Marital Status: Married  Catering manager Violence: Not At Risk (07/05/2018)   Humiliation, Afraid, Rape, and Kick questionnaire    Fear of Current or Ex-Partner: No    Emotionally Abused: No    Physically Abused: No    Sexually Abused: No     Allergies  Allergen Reactions   Atorvastatin     Muscle cramps   Iohexol Hives    Patient should take 13 hours prep    Other Hives    Contrast dye    Rosuvastatin Nausea And Vomiting    Nausea/vomiting    Current Outpatient Medications  Medication Sig Dispense Refill   diphenhydrAMINE (BENADRYL) 50 MG capsule Take  one capsule 1 hour prior to scan. 1 capsule 0   predniSONE (DELTASONE) 50 MG tablet Take one tablet 13 hours, 7 hours, and 1 hour prior to scan. 3 tablet 0   amLODipine (NORVASC) 10 MG tablet Take 1 tablet (10 mg total) by mouth daily. 90 tablet 3   aspirin 81 MG tablet Take 1 tablet (81 mg total) by mouth daily. 30 tablet 11   cetirizine (ZYRTEC) 10 MG tablet Take 10 mg by mouth as needed for allergies.     clopidogrel (PLAVIX) 75 MG tablet Take 1 tablet (75 mg total) by mouth daily. 90 tablet 3   Evolocumab (REPATHA SURECLICK) 140 MG/ML SOAJ Inject 140 mg into the skin every 14 (fourteen) days. 6 mL 3   fluconazole (DIFLUCAN) 100 MG tablet Take 1 tablet (100 mg total) by mouth daily. 28 tablet 0   fluticasone (FLONASE) 50 MCG/ACT nasal spray Place 2 sprays into both nostrils as needed for allergies or rhinitis.     Lancets MISC Test blood sugar once daily. 100 each 2   lisinopril (PRINIVIL,ZESTRIL) 2.5 MG tablet Take 1 tablet (2.5 mg total) by mouth daily. 90 tablet 3   metFORMIN (GLUCOPHAGE) 1000 MG tablet Take 1 tablet (1,000 mg total) by mouth 2 (two) times daily with a meal. 180 tablet 3   omeprazole (PRILOSEC) 40 MG capsule Take 1 capsule (40 mg total) by mouth daily. 30 capsule 4   simvastatin (ZOCOR) 20 MG tablet Take 1 tablet (20 mg total) by mouth at bedtime. 90 tablet 3   TRELEGY ELLIPTA 200-62.5-25 MCG/ACT AEPB Inhale into the lungs.     venlafaxine XR (EFFEXOR-XR) 150 MG 24 hr capsule Take 150 mg by mouth daily with breakfast.     Current Facility-Administered Medications  Medication Dose Route Frequency Provider Last Rate Last Admin   0.9 %  sodium  chloride infusion  500 mL Intravenous Continuous Armbruster, Willaim Rayas, MD        REVIEW OF SYSTEMS:  [X]  denotes positive finding, [ ]  denotes negative finding Cardiac  Comments:  Chest pain or chest pressure:    Shortness of breath upon exertion:    Short of breath when lying flat:    Irregular heart rhythm:        Vascular    Pain in calf, thigh, or hip brought on by ambulation:    Pain in feet at night that wakes you up from your sleep:     Blood clot in your veins:    Leg swelling:         Pulmonary    Oxygen at home:    Productive cough:     Wheezing:         Neurologic    Sudden weakness in arms or legs:     Sudden numbness in arms or legs:     Sudden onset of difficulty speaking or slurred speech:    Temporary loss of vision in one eye:     Problems with dizziness:         Gastrointestinal    Blood in stool:     Vomited blood:         Genitourinary    Burning when urinating:     Blood in urine:        Psychiatric    Major depression:         Hematologic    Bleeding problems:    Problems with blood clotting too easily:  Skin    Rashes or ulcers:        Constitutional    Fever or chills:      PHYSICAL EXAM: There were no vitals filed for this visit.   GENERAL: The patient is a well-nourished female, in no acute distress. The vital signs are documented above. CARDIAC: There is a regular rate and rhythm.  VASCULAR:  Palpable pulse in fem fem bypass graft Pulsatile mass left groin No palpable pedal pulses but no tissue loss PULMONARY: No respiratory distress. ABDOMEN: Soft and non-tender. MUSCULOSKELETAL: There are no major deformities or cyanosis. NEUROLOGIC: No focal weakness or paresthesias are detected.  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  She was seen several weeks ago for surveillance of her femoral-femoral bypass graft initially placed in 2003 by Dr. Madilyn Fireman.  Her femoral femoral duplex several weeks ago showed significant hematoma around the graft in the left groin and on exam she has a pulsatile mass here.  CTA confirms anastomotic pseudoaneurysm over 8 cm in the left groin.  I have recommended operative intervention.  Discussed this would involve repair of the femoral pseudoaneurysm in her left groin with likely revision of her femoral-femoral bypass graft.  I discussed risk and benefits including wound healing problems and infection of the bypass graft in addition to vessel injury, risk of anesthesia, as well as pulmonary renal and cardiac complications.  Will schedule for Monday.   Cephus Shelling, MD [facility] Office: [phone]  Cephus Shelling

## 2023-07-02 NOTE — Progress Notes (Signed)
Surgical Instructions   Your procedure is scheduled on Monday, July 06, 2023 at 12:25 PM.  Report to [facility] Main Entrance "A" at 10:25 AM, then check in with the Admitting office.  Call this number if you have problems the morning of surgery:  [phone]   Remember:  Do not eat or drink after midnight the night before your surgery.     Take these medicines the morning of surgery with A SIP OF WATER: Amlodipine (Norvasc), Aspirin, Fluconazole (Diflucan), Omeprazole (Prilosec), Venlafaxine (Effexor), Ceterizine (Zyrtec) - if needed,   As of today, STOP taking any Aspirin (unless otherwise instructed by your surgeon) Aleve, Naproxen, Ibuprofen, Motrin, Advil, Goody's, BC's, all herbal medications, fish oil, and all vitamins.        Do not wear jewelry or makeup. Do not wear lotions, powders, perfume or deodorant. Do not shave 48 hours prior to surgery.   Do not bring valuables to the hospital. Do not wear nail polish, gel polish, artificial nails, or any other type of covering on natural nails (fingers and toes) If you have artificial nails or gel coating that need to be removed by a nail salon, please have this removed prior to surgery. Artificial nails or gel coating may interfere with anesthesia's ability to adequately monitor your vital signs.  [facility] is not responsible for any belongings or valuables.    Do NOT Smoke (Tobacco/Vaping)  24 hours prior to your procedure   Contacts, glasses, hearing aids, dentures or partials may not be worn into surgery, please bring cases for these belongings   For patients admitted to the hospital, discharge time will be determined by your treatment team.  SURGICAL WAITING ROOM VISITATION Patients having surgery or a procedure may have no more than 2 support people in the waiting area - these visitors may rotate.   Children under the age of 50 must have an adult with them who is not the patient. If the patient needs to stay at the hospital  during part of their recovery, the visitor guidelines for inpatient rooms apply. Pre-op nurse will coordinate an appropriate time for 1 support person to accompany patient in pre-op.  This support person may not rotate.   Please refer to [email or web address] for the visitor guidelines for Inpatients (after your surgery is over and you are in a regular room).   Special instructions:    Oral Hygiene is also important to reduce your risk of infection.  Remember - BRUSH YOUR TEETH THE MORNING OF SURGERY WITH YOUR REGULAR TOOTHPASTE  [facility]- Preparing For Surgery  Before surgery, you can play an important role. Because skin is not sterile, your skin needs to be as free of germs as possible. You can reduce the number of germs on your skin by washing with CHG (chlorahexidine gluconate) Soap before surgery.  CHG is an antiseptic cleaner which kills germs and bonds with the skin to continue killing germs even after washing.    Please do not use if you have an allergy to CHG or antibacterial soaps. If your skin becomes reddened/irritated stop using the CHG.  Do not shave (including legs and underarms) for at least 48 hours prior to first CHG shower. It is OK to shave your face.  Please follow these instructions carefully.    Shower the NIGHT BEFORE SURGERY and the MORNING OF SURGERY with CHG Soap.   If you chose to wash your hair, wash your hair first as usual with your normal shampoo. After you shampoo,  rinse your hair and body thoroughly to remove the shampoo.  Then [employer] and genitals (private parts) with your normal soap and rinse thoroughly to remove soap.  After that Use CHG Soap as you would any other liquid soap. You can apply CHG directly to the skin and wash gently with a scrungie or a clean washcloth.   Apply the CHG Soap to your body ONLY FROM THE NECK DOWN.  Do not use on open wounds or open sores. Avoid contact with your eyes, ears,  mouth and genitals (private parts). Wash Face and genitals (private parts)  with your normal soap.   Wash thoroughly, paying special attention to the area where your surgery will be performed.  Thoroughly rinse your body with warm water from the neck down.  DO NOT shower/wash with your normal soap after using and rinsing off the CHG Soap.  Pat yourself dry with a CLEAN TOWEL.  Wear CLEAN PAJAMAS to bed the night before surgery  Place CLEAN SHEETS on your bed the night before your surgery  DO NOT SLEEP WITH PETS.  Day of Surgery: Take a shower with CHG soap. Wear Clean/Comfortable clothing the morning of surgery Do not apply any deodorants/lotions.   Remember to brush your teeth WITH YOUR REGULAR TOOTHPASTE.   Please read over the fact sheets that you were given.

## 2023-07-03 ENCOUNTER — Other Ambulatory Visit: Payer: Self-pay

## 2023-07-03 ENCOUNTER — Encounter (HOSPITAL_COMMUNITY): Payer: Self-pay

## 2023-07-03 ENCOUNTER — Encounter (HOSPITAL_COMMUNITY)
Admission: RE | Admit: 2023-07-03 | Discharge: 2023-07-03 | Disposition: A | Discharge status: Home / Self Care | Payer: Medicare PPO | Source: Ambulatory Visit | Attending: Vascular Surgery | Admitting: Vascular Surgery

## 2023-07-03 VITALS — BP 122/63 | HR 61 | Temp 98.7°F | Resp 16 | Ht 63.0 in | Wt 128.0 lb

## 2023-07-03 DIAGNOSIS — I724 Aneurysm of artery of lower extremity: Secondary | ICD-10-CM | POA: Insufficient documentation

## 2023-07-03 DIAGNOSIS — Z95828 Presence of other vascular implants and grafts: Secondary | ICD-10-CM | POA: Insufficient documentation

## 2023-07-03 DIAGNOSIS — Z01818 Encounter for other preprocedural examination: Secondary | ICD-10-CM | POA: Insufficient documentation

## 2023-07-03 HISTORY — DX: Unspecified asthma, uncomplicated: J45.909

## 2023-07-03 HISTORY — DX: Gastro-esophageal reflux disease without esophagitis: K21.9

## 2023-07-03 HISTORY — DX: Chronic obstructive pulmonary disease, unspecified: J44.9

## 2023-07-03 LAB — SURGICAL PCR SCREEN
MRSA, PCR: NEGATIVE
Staphylococcus aureus: NEGATIVE

## 2023-07-03 LAB — COMPREHENSIVE METABOLIC PANEL
ALT: 12 U/L (ref 0–44)
AST: 15 U/L (ref 15–41)
Albumin: 3.9 g/dL (ref 3.5–5.0)
Alkaline Phosphatase: 94 U/L (ref 38–126)
Anion gap: 8 (ref 5–15)
BUN: 5 mg/dL — ABNORMAL LOW (ref 6–20)
CO2: 26 mmol/L (ref 22–32)
Calcium: 9.3 mg/dL (ref 8.9–10.3)
Chloride: 105 mmol/L (ref 98–111)
Creatinine, Ser: 0.53 mg/dL (ref 0.44–1.00)
GFR, Estimated: >60 mL/min (ref >60)
Glucose, Bld: 113 mg/dL — ABNORMAL HIGH (ref 70–99)
Potassium: 3.6 mmol/L (ref 3.5–5.1)
Sodium: 139 mmol/L (ref 135–145)
Total Bilirubin: 0.3 mg/dL (ref 0.3–1.2)
Total Protein: 7.8 g/dL (ref 6.5–8.1)

## 2023-07-03 LAB — TYPE AND SCREEN
ABO/RH(D): O NEG
Antibody Screen: NEGATIVE

## 2023-07-03 LAB — CBC
HCT: 46.5 % — ABNORMAL HIGH (ref 36.0–46.0)
Hemoglobin: 15.4 g/dL — ABNORMAL HIGH (ref 12.0–15.0)
MCH: 29.3 pg (ref 26.0–34.0)
MCHC: 33.1 g/dL (ref 30.0–36.0)
MCV: 88.4 fL (ref 80.0–100.0)
Platelets: 323 10*3/uL (ref 150–400)
RBC: 5.26 MIL/uL — ABNORMAL HIGH (ref 3.87–5.11)
RDW: 15.1 % (ref 11.5–15.5)
WBC: 8.8 10*3/uL (ref 4.0–10.5)
nRBC: 0 % (ref 0.0–0.2)

## 2023-07-03 LAB — URINALYSIS, ROUTINE W REFLEX MICROSCOPIC
Bilirubin Urine: NEGATIVE
Glucose, UA: NEGATIVE mg/dL
Hgb urine dipstick: NEGATIVE
Ketones, ur: NEGATIVE mg/dL
Leukocytes,Ua: NEGATIVE
Nitrite: NEGATIVE
Protein, ur: NEGATIVE mg/dL
Specific Gravity, Urine: 1.002 — ABNORMAL LOW (ref 1.005–1.030)
pH: 7 (ref 5.0–8.0)

## 2023-07-03 LAB — PROTIME-INR
INR: 1 (ref 0.8–1.2)
Prothrombin Time: 13.2 seconds (ref 11.4–15.2)

## 2023-07-03 LAB — APTT: aPTT: 29 seconds (ref 24–36)

## 2023-07-03 LAB — GLUCOSE, CAPILLARY: Glucose-Capillary: 154 mg/dL — ABNORMAL HIGH (ref 70–99)

## 2023-07-03 NOTE — Progress Notes (Signed)
PCP - Dr. Drusilla Kanner Cardiologist - Dr. Tomie China (for heart murmur)  EKG - 07/03/23 ECHO - 01/30/21   Fasting Blood Sugar - 159 Checks Blood Sugar - occasionally, last A1C was 7.3 in March .  Blood Thinner Instructions: Plavix last dose 06/30/23 Aspirin Instructions: to continue  ERAS Protcol - No  Anesthesia review: Yes, hx of caroltid artery occlusion, peripheral vascular disease, heart murmur   Patient denies shortness of breath, fever, cough and chest pain at PAT appointment   All instructions explained to the patient, with a verbal understanding of the material. Patient agrees to go over the instructions while at home for a better understanding. Patient also instructed to self quarantine after being tested for COVID-19. The opportunity to ask questions was provided.

## 2023-07-06 ENCOUNTER — Inpatient Hospital Stay (HOSPITAL_COMMUNITY)
Admission: RE | Admit: 2023-07-06 | Discharge: 2023-07-12 | DRG: 253 | Disposition: A | Discharge status: Home Health | Payer: Medicare PPO | Attending: Vascular Surgery | Admitting: Vascular Surgery

## 2023-07-06 ENCOUNTER — Inpatient Hospital Stay (HOSPITAL_COMMUNITY): Payer: Medicare PPO | Admitting: Anesthesiology

## 2023-07-06 ENCOUNTER — Encounter (HOSPITAL_COMMUNITY)
Admission: RE | Disposition: A | Discharge status: Home / Self Care | Payer: Self-pay | Source: Home / Self Care | Attending: Vascular Surgery

## 2023-07-06 ENCOUNTER — Encounter (HOSPITAL_COMMUNITY): Payer: Self-pay | Admitting: Vascular Surgery

## 2023-07-06 ENCOUNTER — Other Ambulatory Visit: Payer: Self-pay

## 2023-07-06 DIAGNOSIS — M1990 Unspecified osteoarthritis, unspecified site: Secondary | ICD-10-CM | POA: Diagnosis present

## 2023-07-06 DIAGNOSIS — Y828 Other medical devices associated with adverse incidents: Secondary | ICD-10-CM | POA: Diagnosis present

## 2023-07-06 DIAGNOSIS — J449 Chronic obstructive pulmonary disease, unspecified: Secondary | ICD-10-CM

## 2023-07-06 DIAGNOSIS — F32A Depression, unspecified: Secondary | ICD-10-CM | POA: Diagnosis present

## 2023-07-06 DIAGNOSIS — T82898A Other specified complication of vascular prosthetic devices, implants and grafts, initial encounter: Secondary | ICD-10-CM | POA: Diagnosis present

## 2023-07-06 DIAGNOSIS — Z95820 Peripheral vascular angioplasty status with implants and grafts: Secondary | ICD-10-CM

## 2023-07-06 DIAGNOSIS — F17210 Nicotine dependence, cigarettes, uncomplicated: Secondary | ICD-10-CM | POA: Diagnosis present

## 2023-07-06 DIAGNOSIS — E1165 Type 2 diabetes mellitus with hyperglycemia: Secondary | ICD-10-CM | POA: Diagnosis not present

## 2023-07-06 DIAGNOSIS — Z8673 Personal history of transient ischemic attack (TIA), and cerebral infarction without residual deficits: Secondary | ICD-10-CM

## 2023-07-06 DIAGNOSIS — Z833 Family history of diabetes mellitus: Secondary | ICD-10-CM | POA: Diagnosis not present

## 2023-07-06 DIAGNOSIS — Z7902 Long term (current) use of antithrombotics/antiplatelets: Secondary | ICD-10-CM

## 2023-07-06 DIAGNOSIS — F419 Anxiety disorder, unspecified: Secondary | ICD-10-CM | POA: Diagnosis present

## 2023-07-06 DIAGNOSIS — Z01818 Encounter for other preprocedural examination: Secondary | ICD-10-CM

## 2023-07-06 DIAGNOSIS — E785 Hyperlipidemia, unspecified: Secondary | ICD-10-CM | POA: Diagnosis present

## 2023-07-06 DIAGNOSIS — Z83438 Family history of other disorder of lipoprotein metabolism and other lipidemia: Secondary | ICD-10-CM | POA: Diagnosis not present

## 2023-07-06 DIAGNOSIS — Z560 Unemployment, unspecified: Secondary | ICD-10-CM | POA: Diagnosis not present

## 2023-07-06 DIAGNOSIS — Z87442 Personal history of urinary calculi: Secondary | ICD-10-CM

## 2023-07-06 DIAGNOSIS — I739 Peripheral vascular disease, unspecified: Secondary | ICD-10-CM

## 2023-07-06 DIAGNOSIS — D72829 Elevated white blood cell count, unspecified: Secondary | ICD-10-CM | POA: Diagnosis present

## 2023-07-06 DIAGNOSIS — D62 Acute posthemorrhagic anemia: Secondary | ICD-10-CM | POA: Diagnosis not present

## 2023-07-06 DIAGNOSIS — Z7984 Long term (current) use of oral hypoglycemic drugs: Secondary | ICD-10-CM

## 2023-07-06 DIAGNOSIS — D649 Anemia, unspecified: Secondary | ICD-10-CM | POA: Diagnosis present

## 2023-07-06 DIAGNOSIS — I724 Aneurysm of artery of lower extremity: Secondary | ICD-10-CM | POA: Diagnosis present

## 2023-07-06 DIAGNOSIS — I10 Essential (primary) hypertension: Secondary | ICD-10-CM | POA: Diagnosis present

## 2023-07-06 DIAGNOSIS — Z8249 Family history of ischemic heart disease and other diseases of the circulatory system: Secondary | ICD-10-CM | POA: Diagnosis not present

## 2023-07-06 DIAGNOSIS — Y832 Surgical operation with anastomosis, bypass or graft as the cause of abnormal reaction of the patient, or of later complication, without mention of misadventure at the time of the procedure: Secondary | ICD-10-CM | POA: Diagnosis present

## 2023-07-06 DIAGNOSIS — Z79899 Other long term (current) drug therapy: Secondary | ICD-10-CM

## 2023-07-06 DIAGNOSIS — Z85828 Personal history of other malignant neoplasm of skin: Secondary | ICD-10-CM

## 2023-07-06 DIAGNOSIS — Z7982 Long term (current) use of aspirin: Secondary | ICD-10-CM

## 2023-07-06 DIAGNOSIS — E1151 Type 2 diabetes mellitus with diabetic peripheral angiopathy without gangrene: Secondary | ICD-10-CM | POA: Diagnosis present

## 2023-07-06 DIAGNOSIS — Z91041 Radiographic dye allergy status: Secondary | ICD-10-CM

## 2023-07-06 DIAGNOSIS — Z7951 Long term (current) use of inhaled steroids: Secondary | ICD-10-CM

## 2023-07-06 DIAGNOSIS — Z888 Allergy status to other drugs, medicaments and biological substances status: Secondary | ICD-10-CM

## 2023-07-06 DIAGNOSIS — J4489 Other specified chronic obstructive pulmonary disease: Secondary | ICD-10-CM | POA: Diagnosis present

## 2023-07-06 DIAGNOSIS — Z841 Family history of disorders of kidney and ureter: Secondary | ICD-10-CM | POA: Diagnosis not present

## 2023-07-06 DIAGNOSIS — M62830 Muscle spasm of back: Secondary | ICD-10-CM | POA: Diagnosis present

## 2023-07-06 DIAGNOSIS — K219 Gastro-esophageal reflux disease without esophagitis: Secondary | ICD-10-CM | POA: Diagnosis present

## 2023-07-06 DIAGNOSIS — Z808 Family history of malignant neoplasm of other organs or systems: Secondary | ICD-10-CM | POA: Diagnosis not present

## 2023-07-06 HISTORY — PX: FALSE ANEURYSM REPAIR: SHX5152

## 2023-07-06 HISTORY — DX: Aneurysm of artery of lower extremity: I72.4

## 2023-07-06 HISTORY — DX: Peripheral vascular disease, unspecified: I73.9

## 2023-07-06 HISTORY — PX: FEMORAL-FEMORAL BYPASS GRAFT: SHX936

## 2023-07-06 LAB — POCT I-STAT 7, (LYTES, BLD GAS, ICA,H+H)
Acid-base deficit: 5 mmol/L — ABNORMAL HIGH (ref 0.0–2.0)
Bicarbonate: 20.5 mmol/L (ref 20.0–28.0)
Calcium, Ion: 1.1 mmol/L — ABNORMAL LOW (ref 1.15–1.40)
HCT: 34 % — ABNORMAL LOW (ref 36.0–46.0)
Hemoglobin: 11.6 g/dL — ABNORMAL LOW (ref 12.0–15.0)
O2 Saturation: 98 %
Potassium: 3.5 mmol/L (ref 3.5–5.1)
Sodium: 139 mmol/L (ref 135–145)
TCO2: 22 mmol/L (ref 22–32)
pCO2 arterial: 37.7 mmHg (ref 32–48)
pH, Arterial: 7.344 — ABNORMAL LOW (ref 7.35–7.45)
pO2, Arterial: 108 mmHg (ref 83–108)

## 2023-07-06 LAB — GLUCOSE, CAPILLARY
Glucose-Capillary: 129 mg/dL — ABNORMAL HIGH (ref 70–99)
Glucose-Capillary: 80 mg/dL (ref 70–99)
Glucose-Capillary: 89 mg/dL (ref 70–99)
Glucose-Capillary: 171 mg/dL — ABNORMAL HIGH (ref 70–99)
Glucose-Capillary: 240 mg/dL — ABNORMAL HIGH (ref 70–99)

## 2023-07-06 LAB — HEMOGLOBIN A1C
Hgb A1c MFr Bld: 7.1 % — ABNORMAL HIGH (ref 4.8–5.6)
Mean Plasma Glucose: 157.07 mg/dL

## 2023-07-06 LAB — POCT ACTIVATED CLOTTING TIME: Activated Clotting Time: 232 seconds

## 2023-07-06 SURGERY — REPAIR, PSEUDOANEURYSM
Anesthesia: General | Laterality: Left

## 2023-07-06 MED ORDER — LISINOPRIL 5 MG PO TABS
2.5000 mg | ORAL_TABLET | Freq: Every day | ORAL | Status: DC
  Administered 2023-07-07 – 2023-07-12 (×6): 2.5 mg via ORAL
  Filled 2023-07-06 (×6): qty 1

## 2023-07-06 MED ORDER — POTASSIUM CHLORIDE CRYS ER 20 MEQ PO TBCR: 20.0000 meq | EXTENDED_RELEASE_TABLET | Freq: Every day | ORAL | Status: DC | PRN

## 2023-07-06 MED ORDER — PROPOFOL 10 MG/ML IV BOLUS
INTRAVENOUS | Status: AC
  Filled 2023-07-06: qty 20

## 2023-07-06 MED ORDER — POLYETHYLENE GLYCOL 3350 17 G PO PACK
17.0000 g | PACK | Freq: Every day | ORAL | Status: DC | PRN
  Administered 2023-07-09 – 2023-07-10 (×2): 17 g via ORAL
  Filled 2023-07-06 (×2): qty 1

## 2023-07-06 MED ORDER — SODIUM CHLORIDE 0.9 % IV SOLN: 500.0000 mL | Freq: Once | INTRAVENOUS | Status: DC | PRN

## 2023-07-06 MED ORDER — MIDAZOLAM HCL 2 MG/2ML IJ SOLN
INTRAMUSCULAR | Status: AC
  Filled 2023-07-06: qty 2

## 2023-07-06 MED ORDER — HEPARIN SODIUM (PORCINE) 1000 UNIT/ML IJ SOLN
INTRAMUSCULAR | Status: DC | PRN
  Administered 2023-07-06: 2000 [IU] via INTRAVENOUS
  Administered 2023-07-06: 6000 [IU] via INTRAVENOUS

## 2023-07-06 MED ORDER — LIDOCAINE 2% (20 MG/ML) 5 ML SYRINGE
INTRAMUSCULAR | Status: DC | PRN
  Administered 2023-07-06: 60 mg via INTRAVENOUS

## 2023-07-06 MED ORDER — FLUTICASONE FUROATE-VILANTEROL 200-25 MCG/ACT IN AEPB
1.0000 | INHALATION_SPRAY | Freq: Every day | RESPIRATORY_TRACT | Status: DC
  Administered 2023-07-07 – 2023-07-12 (×6): 1 via RESPIRATORY_TRACT
  Filled 2023-07-06: qty 28

## 2023-07-06 MED ORDER — DEXAMETHASONE SODIUM PHOSPHATE 10 MG/ML IJ SOLN
INTRAMUSCULAR | Status: AC
  Filled 2023-07-06: qty 1

## 2023-07-06 MED ORDER — PROPOFOL 10 MG/ML IV BOLUS
INTRAVENOUS | Status: DC | PRN
  Administered 2023-07-06: 150 mg via INTRAVENOUS
  Administered 2023-07-06: 50 mg via INTRAVENOUS

## 2023-07-06 MED ORDER — EPHEDRINE SULFATE-NACL 50-0.9 MG/10ML-% IV SOSY
PREFILLED_SYRINGE | INTRAVENOUS | Status: DC | PRN
  Administered 2023-07-06: 5 mg via INTRAVENOUS

## 2023-07-06 MED ORDER — PROTAMINE SULFATE 10 MG/ML IV SOLN
INTRAVENOUS | Status: DC | PRN
  Administered 2023-07-06: 50 mg via INTRAVENOUS

## 2023-07-06 MED ORDER — SIMVASTATIN 20 MG PO TABS
20.0000 mg | ORAL_TABLET | Freq: Every day | ORAL | Status: DC
  Administered 2023-07-07 – 2023-07-11 (×5): 20 mg via ORAL
  Filled 2023-07-06 (×5): qty 1

## 2023-07-06 MED ORDER — CHLORHEXIDINE GLUCONATE CLOTH 2 % EX PADS: 6.0000 | MEDICATED_PAD | Freq: Once | CUTANEOUS | Status: DC

## 2023-07-06 MED ORDER — 0.9 % SODIUM CHLORIDE (POUR BTL) OPTIME
TOPICAL | Status: DC | PRN
  Administered 2023-07-06: 1000 mL

## 2023-07-06 MED ORDER — CEFAZOLIN SODIUM-DEXTROSE 2-4 GM/100ML-% IV SOLN
2.0000 g | INTRAVENOUS | Status: AC
  Administered 2023-07-06: 2 g via INTRAVENOUS

## 2023-07-06 MED ORDER — METOPROLOL TARTRATE 5 MG/5ML IV SOLN: 2.0000 mg | INTRAVENOUS | Status: DC | PRN

## 2023-07-06 MED ORDER — LABETALOL HCL 5 MG/ML IV SOLN
INTRAVENOUS | Status: AC
  Filled 2023-07-06: qty 4

## 2023-07-06 MED ORDER — AMLODIPINE BESYLATE 10 MG PO TABS
10.0000 mg | ORAL_TABLET | Freq: Every day | ORAL | Status: DC
  Administered 2023-07-07 – 2023-07-12 (×6): 10 mg via ORAL
  Filled 2023-07-06 (×6): qty 1

## 2023-07-06 MED ORDER — LABETALOL HCL 5 MG/ML IV SOLN
INTRAVENOUS | Status: DC | PRN
  Administered 2023-07-06: 5 mg via INTRAVENOUS

## 2023-07-06 MED ORDER — ACETAMINOPHEN 650 MG RE SUPP: 325.0000 mg | RECTAL | Status: DC | PRN

## 2023-07-06 MED ORDER — PANTOPRAZOLE SODIUM 40 MG PO TBEC
40.0000 mg | DELAYED_RELEASE_TABLET | Freq: Every day | ORAL | Status: DC
  Administered 2023-07-06 – 2023-07-12 (×7): 40 mg via ORAL
  Filled 2023-07-06 (×7): qty 1

## 2023-07-06 MED ORDER — FENTANYL CITRATE (PF) 250 MCG/5ML IJ SOLN
INTRAMUSCULAR | Status: DC | PRN
  Administered 2023-07-06 (×2): 50 ug via INTRAVENOUS
  Administered 2023-07-06: 100 ug via INTRAVENOUS
  Administered 2023-07-06: 50 ug via INTRAVENOUS

## 2023-07-06 MED ORDER — LACTATED RINGERS IV SOLN: INTRAVENOUS | Status: DC

## 2023-07-06 MED ORDER — DOCUSATE SODIUM 100 MG PO CAPS
100.0000 mg | ORAL_CAPSULE | Freq: Every day | ORAL | Status: DC
  Administered 2023-07-07 – 2023-07-12 (×6): 100 mg via ORAL
  Filled 2023-07-06 (×6): qty 1

## 2023-07-06 MED ORDER — SUGAMMADEX SODIUM 200 MG/2ML IV SOLN
INTRAVENOUS | Status: DC | PRN
  Administered 2023-07-06: 150 mg via INTRAVENOUS

## 2023-07-06 MED ORDER — VENLAFAXINE HCL ER 75 MG PO CP24
150.0000 mg | ORAL_CAPSULE | Freq: Every day | ORAL | Status: DC
  Administered 2023-07-07 – 2023-07-12 (×6): 150 mg via ORAL
  Filled 2023-07-06 (×6): qty 2

## 2023-07-06 MED ORDER — SODIUM CHLORIDE 0.9 % IV SOLN: INTRAVENOUS | Status: DC

## 2023-07-06 MED ORDER — ROCURONIUM BROMIDE 10 MG/ML (PF) SYRINGE
PREFILLED_SYRINGE | INTRAVENOUS | Status: AC
  Filled 2023-07-06: qty 10

## 2023-07-06 MED ORDER — ROCURONIUM BROMIDE 10 MG/ML (PF) SYRINGE
PREFILLED_SYRINGE | INTRAVENOUS | Status: DC | PRN
  Administered 2023-07-06: 20 mg via INTRAVENOUS
  Administered 2023-07-06: 40 mg via INTRAVENOUS

## 2023-07-06 MED ORDER — ALUM & MAG HYDROXIDE-SIMETH 200-200-20 MG/5ML PO SUSP
15.0000 mL | ORAL | Status: DC | PRN
  Administered 2023-07-07: 30 mL via ORAL
  Filled 2023-07-06: qty 30

## 2023-07-06 MED ORDER — CHLORHEXIDINE GLUCONATE 0.12 % MT SOLN
15.0000 mL | Freq: Once | OROMUCOSAL | Status: AC
  Administered 2023-07-06: 15 mL via OROMUCOSAL

## 2023-07-06 MED ORDER — PHENYLEPHRINE 80 MCG/ML (10ML) SYRINGE FOR IV PUSH (FOR BLOOD PRESSURE SUPPORT)
PREFILLED_SYRINGE | INTRAVENOUS | Status: DC | PRN
  Administered 2023-07-06: 80 ug via INTRAVENOUS

## 2023-07-06 MED ORDER — LACTATED RINGERS IV SOLN: INTRAVENOUS | Status: DC | PRN

## 2023-07-06 MED ORDER — LABETALOL HCL 5 MG/ML IV SOLN
10.0000 mg | INTRAVENOUS | Status: DC | PRN
  Administered 2023-07-06: 10 mg via INTRAVENOUS
  Filled 2023-07-06: qty 4

## 2023-07-06 MED ORDER — HYDROMORPHONE HCL 1 MG/ML IJ SOLN
INTRAMUSCULAR | Status: AC
  Filled 2023-07-06: qty 0.5

## 2023-07-06 MED ORDER — CLEVIDIPINE BUTYRATE 0.5 MG/ML IV EMUL
INTRAVENOUS | Status: AC
  Filled 2023-07-06: qty 50

## 2023-07-06 MED ORDER — FENTANYL CITRATE (PF) 100 MCG/2ML IJ SOLN
25.0000 ug | INTRAMUSCULAR | Status: DC | PRN
  Administered 2023-07-06: 50 ug via INTRAVENOUS
  Administered 2023-07-06: 25 ug via INTRAVENOUS

## 2023-07-06 MED ORDER — HEMOSTATIC AGENTS (NO CHARGE) OPTIME
TOPICAL | Status: DC | PRN
  Administered 2023-07-06: 1 via TOPICAL

## 2023-07-06 MED ORDER — CLEVIDIPINE BUTYRATE 0.5 MG/ML IV EMUL
INTRAVENOUS | Status: DC | PRN
  Administered 2023-07-06: 2 mg/h via INTRAVENOUS

## 2023-07-06 MED ORDER — OXYCODONE HCL 5 MG PO TABS
5.0000 mg | ORAL_TABLET | ORAL | Status: DC | PRN
  Administered 2023-07-06 – 2023-07-11 (×23): 10 mg via ORAL
  Administered 2023-07-12: 5 mg via ORAL
  Administered 2023-07-12: 10 mg via ORAL
  Filled 2023-07-06: qty 2
  Filled 2023-07-06: qty 1
  Filled 2023-07-06 (×24): qty 2

## 2023-07-06 MED ORDER — ONDANSETRON HCL 4 MG/2ML IJ SOLN
INTRAMUSCULAR | Status: AC
  Filled 2023-07-06: qty 2

## 2023-07-06 MED ORDER — SUCCINYLCHOLINE 20MG/ML (10ML) SYRINGE FOR MEDFUSION PUMP - OPTIME
INTRAMUSCULAR | Status: DC | PRN
  Administered 2023-07-06: 100 mg via INTRAVENOUS

## 2023-07-06 MED ORDER — BISACODYL 10 MG RE SUPP
10.0000 mg | Freq: Every day | RECTAL | Status: DC | PRN
  Administered 2023-07-10: 10 mg via RECTAL
  Filled 2023-07-06: qty 1

## 2023-07-06 MED ORDER — ALBUMIN HUMAN 5 % IV SOLN: INTRAVENOUS | Status: DC | PRN

## 2023-07-06 MED ORDER — UMECLIDINIUM BROMIDE 62.5 MCG/ACT IN AEPB
1.0000 | INHALATION_SPRAY | Freq: Every day | RESPIRATORY_TRACT | Status: DC
  Administered 2023-07-07 – 2023-07-12 (×6): 1 via RESPIRATORY_TRACT
  Filled 2023-07-06: qty 7

## 2023-07-06 MED ORDER — ACETAMINOPHEN 325 MG PO TABS: 325.0000 mg | ORAL_TABLET | ORAL | Status: DC | PRN

## 2023-07-06 MED ORDER — ASPIRIN 81 MG PO CHEW
81.0000 mg | CHEWABLE_TABLET | Freq: Every day | ORAL | Status: DC
  Administered 2023-07-07 – 2023-07-12 (×6): 81 mg via ORAL
  Filled 2023-07-06 (×6): qty 1

## 2023-07-06 MED ORDER — OXYCODONE HCL 5 MG/5ML PO SOLN: 5.0000 mg | Freq: Once | ORAL | Status: DC | PRN

## 2023-07-06 MED ORDER — DEXAMETHASONE SODIUM PHOSPHATE 10 MG/ML IJ SOLN
INTRAMUSCULAR | Status: DC | PRN
  Administered 2023-07-06: 10 mg via INTRAVENOUS

## 2023-07-06 MED ORDER — MAGNESIUM SULFATE 2 GM/50ML IV SOLN: 2.0000 g | Freq: Every day | INTRAVENOUS | Status: DC | PRN

## 2023-07-06 MED ORDER — FLUTICASONE PROPIONATE 50 MCG/ACT NA SUSP: 2.0000 | Freq: Two times a day (BID) | NASAL | Status: DC | PRN

## 2023-07-06 MED ORDER — HYDRALAZINE HCL 20 MG/ML IJ SOLN: 5.0000 mg | INTRAMUSCULAR | Status: DC | PRN

## 2023-07-06 MED ORDER — MORPHINE SULFATE (PF) 2 MG/ML IV SOLN
2.0000 mg | INTRAVENOUS | Status: DC | PRN
  Administered 2023-07-06 – 2023-07-09 (×3): 2 mg via INTRAVENOUS
  Filled 2023-07-06 (×3): qty 1

## 2023-07-06 MED ORDER — CEFAZOLIN SODIUM-DEXTROSE 2-4 GM/100ML-% IV SOLN
INTRAVENOUS | Status: AC
  Filled 2023-07-06: qty 100

## 2023-07-06 MED ORDER — METFORMIN HCL 500 MG PO TABS
1000.0000 mg | ORAL_TABLET | Freq: Two times a day (BID) | ORAL | Status: DC
  Administered 2023-07-07 – 2023-07-12 (×11): 1000 mg via ORAL
  Filled 2023-07-06 (×11): qty 2

## 2023-07-06 MED ORDER — PHENYLEPHRINE HCL-NACL 20-0.9 MG/250ML-% IV SOLN
INTRAVENOUS | Status: DC | PRN
  Administered 2023-07-06: 20 ug/min via INTRAVENOUS

## 2023-07-06 MED ORDER — GUAIFENESIN-DM 100-10 MG/5ML PO SYRP: 15.0000 mL | ORAL_SOLUTION | ORAL | Status: DC | PRN

## 2023-07-06 MED ORDER — ONDANSETRON HCL 4 MG/2ML IJ SOLN
4.0000 mg | Freq: Four times a day (QID) | INTRAMUSCULAR | Status: DC | PRN
  Administered 2023-07-08: 4 mg via INTRAVENOUS
  Filled 2023-07-06: qty 2

## 2023-07-06 MED ORDER — LIDOCAINE 2% (20 MG/ML) 5 ML SYRINGE
INTRAMUSCULAR | Status: AC
  Filled 2023-07-06: qty 5

## 2023-07-06 MED ORDER — LABETALOL HCL 5 MG/ML IV SOLN
5.0000 mg | INTRAVENOUS | Status: DC | PRN
  Administered 2023-07-06: 5 mg via INTRAVENOUS

## 2023-07-06 MED ORDER — INSULIN ASPART 100 UNIT/ML IJ SOLN
0.0000 [IU] | Freq: Three times a day (TID) | INTRAMUSCULAR | Status: DC
  Administered 2023-07-07 (×3): 2 [IU] via SUBCUTANEOUS
  Administered 2023-07-08: 1 [IU] via SUBCUTANEOUS
  Administered 2023-07-08 (×2): 2 [IU] via SUBCUTANEOUS
  Administered 2023-07-09: 3 [IU] via SUBCUTANEOUS
  Administered 2023-07-09: 2 [IU] via SUBCUTANEOUS
  Administered 2023-07-09 – 2023-07-12 (×4): 1 [IU] via SUBCUTANEOUS

## 2023-07-06 MED ORDER — FENTANYL CITRATE (PF) 250 MCG/5ML IJ SOLN
INTRAMUSCULAR | Status: AC
  Filled 2023-07-06: qty 5

## 2023-07-06 MED ORDER — OXYCODONE HCL 5 MG PO TABS: 5.0000 mg | ORAL_TABLET | Freq: Once | ORAL | Status: DC | PRN

## 2023-07-06 MED ORDER — ORAL CARE MOUTH RINSE: 15.0000 mL | Freq: Once | OROMUCOSAL | Status: AC

## 2023-07-06 MED ORDER — FENTANYL CITRATE (PF) 100 MCG/2ML IJ SOLN
INTRAMUSCULAR | Status: AC
  Filled 2023-07-06: qty 2

## 2023-07-06 MED ORDER — ONDANSETRON HCL 4 MG/2ML IJ SOLN
INTRAMUSCULAR | Status: DC | PRN
  Administered 2023-07-06: 4 mg via INTRAVENOUS

## 2023-07-06 MED ORDER — CHLORHEXIDINE GLUCONATE 0.12 % MT SOLN
OROMUCOSAL | Status: AC
  Filled 2023-07-06: qty 15

## 2023-07-06 MED ORDER — PHENOL 1.4 % MT LIQD: 1.0000 | OROMUCOSAL | Status: DC | PRN

## 2023-07-06 MED ORDER — ESMOLOL HCL 100 MG/10ML IV SOLN
INTRAVENOUS | Status: DC | PRN
  Administered 2023-07-06: 50 mg via INTRAVENOUS
  Administered 2023-07-06: 40 mg via INTRAVENOUS

## 2023-07-06 MED ORDER — ONDANSETRON HCL 4 MG/2ML IJ SOLN: 4.0000 mg | Freq: Four times a day (QID) | INTRAMUSCULAR | Status: DC | PRN

## 2023-07-06 MED ORDER — HEPARIN 6000 UNIT IRRIGATION SOLUTION
Status: DC | PRN
  Administered 2023-07-06: 1

## 2023-07-06 MED ORDER — HEPARIN 6000 UNIT IRRIGATION SOLUTION
Status: AC
  Filled 2023-07-06: qty 500

## 2023-07-06 MED ORDER — CEFAZOLIN SODIUM-DEXTROSE 2-4 GM/100ML-% IV SOLN
2.0000 g | Freq: Three times a day (TID) | INTRAVENOUS | Status: AC
  Administered 2023-07-06 – 2023-07-09 (×9): 2 g via INTRAVENOUS
  Filled 2023-07-06 (×9): qty 100

## 2023-07-06 SURGICAL SUPPLY — 65 items
ADH SKN CLS APL DERMABOND .7 (GAUZE/BANDAGES/DRESSINGS) ×2
ADH SKN CLS LQ APL DERMABOND (GAUZE/BANDAGES/DRESSINGS) ×2
AGENT HMST SPONGE THK3/8 (HEMOSTASIS)
BAG COUNTER SPONGE SURGICOUNT (BAG) ×2 IMPLANT
BAG SPNG CNTER NS LX DISP (BAG) ×2
BANDAGE ESMARK 6X9 LF (GAUZE/BANDAGES/DRESSINGS) IMPLANT
BLADE CLIPPER SURG (BLADE) ×2 IMPLANT
BNDG CMPR 9X6 STRL LF SNTH (GAUZE/BANDAGES/DRESSINGS)
BNDG ELASTIC 4X5.8 VLCR STR LF (GAUZE/BANDAGES/DRESSINGS) IMPLANT
BNDG ESMARK 6X9 LF (GAUZE/BANDAGES/DRESSINGS)
CANISTER SUCT 3000ML PPV (MISCELLANEOUS) ×2 IMPLANT
CATH EMB 3FR 40 (CATHETERS) IMPLANT
CLIP TI MEDIUM 24 (CLIP) ×2 IMPLANT
CLIP TI WIDE RED SMALL 24 (CLIP) ×2 IMPLANT
CUFF TOURN SGL QUICK 18X4 (TOURNIQUET CUFF) IMPLANT
CUFF TOURN SGL QUICK 24 (TOURNIQUET CUFF)
CUFF TOURN SGL QUICK 34 (TOURNIQUET CUFF)
CUFF TOURN SGL QUICK 42 (TOURNIQUET CUFF) IMPLANT
CUFF TRNQT CYL 24X4X16.5-23 (TOURNIQUET CUFF) IMPLANT
CUFF TRNQT CYL 34X4.125X (TOURNIQUET CUFF) IMPLANT
DERMABOND ADVANCED .7 DNX12 (GAUZE/BANDAGES/DRESSINGS) ×2 IMPLANT
DERMABOND ADVANCED .7 DNX6 (GAUZE/BANDAGES/DRESSINGS) IMPLANT
DRAIN CHANNEL 15F RND FF W/TCR (WOUND CARE) IMPLANT
DRESSING PEEL AND PLC PRVNA 13 (GAUZE/BANDAGES/DRESSINGS) IMPLANT
DRSG PEEL AND PLACE PREVENA 13 (GAUZE/BANDAGES/DRESSINGS) ×2
ELECT REM PT RETURN 9FT ADLT (ELECTROSURGICAL) ×2
ELECTRODE REM PT RTRN 9FT ADLT (ELECTROSURGICAL) ×2 IMPLANT
EVACUATOR SILICONE 100CC (DRAIN) IMPLANT
GAUZE 4X4 16PLY ~~LOC~~+RFID DBL (SPONGE) IMPLANT
GLOVE BIO SURGEON STRL SZ7.5 (GLOVE) ×2 IMPLANT
GLOVE BIOGEL PI IND STRL 8 (GLOVE) ×2 IMPLANT
GOWN STRL REUS W/ TWL LRG LVL3 (GOWN DISPOSABLE) ×4 IMPLANT
GOWN STRL REUS W/ TWL XL LVL3 (GOWN DISPOSABLE) ×4 IMPLANT
GOWN STRL REUS W/TWL LRG LVL3 (GOWN DISPOSABLE) ×4
GOWN STRL REUS W/TWL XL LVL3 (GOWN DISPOSABLE) ×4
GRAFT HEMASHIELD 8MM (Vascular Products) ×2 IMPLANT
GRAFT VASC STRG 30X8KNIT (Vascular Products) IMPLANT
HEMOSTAT SNOW SURGICEL 2X4 (HEMOSTASIS) IMPLANT
HEMOSTAT SPONGE AVITENE ULTRA (HEMOSTASIS) IMPLANT
INSERT FOGARTY SM (MISCELLANEOUS) ×2 IMPLANT
KIT BASIN OR (CUSTOM PROCEDURE TRAY) ×2 IMPLANT
KIT DRSG PREVENA PLUS 7DAY 125 (MISCELLANEOUS) IMPLANT
KIT TURNOVER KIT B (KITS) ×2 IMPLANT
NS IRRIG 1000ML POUR BTL (IV SOLUTION) ×4 IMPLANT
PACK PERIPHERAL VASCULAR (CUSTOM PROCEDURE TRAY) ×2 IMPLANT
PAD ARMBOARD 7.5X6 YLW CONV (MISCELLANEOUS) ×4 IMPLANT
POWDER MYRIAD MORCLLS FINE 500 (Miscellaneous) IMPLANT
PWDR MYRIAD MORCELLS FINE 500 (Miscellaneous) ×2 IMPLANT
SPONGE T-LAP 18X18 ~~LOC~~+RFID (SPONGE) IMPLANT
STAPLER VISISTAT 35W (STAPLE) IMPLANT
STOPCOCK 4 WAY LG BORE MALE ST (IV SETS) IMPLANT
SUT MNCRL AB 4-0 PS2 18 (SUTURE) ×4 IMPLANT
SUT PDS AB 1 CT1 36 (SUTURE) IMPLANT
SUT PROLENE 5 0 C 1 24 (SUTURE) ×4 IMPLANT
SUT PROLENE 6 0 BV (SUTURE) IMPLANT
SUT SILK 2 0 PERMA HAND 18 BK (SUTURE) IMPLANT
SUT VIC AB 2-0 CT1 27 (SUTURE) ×8
SUT VIC AB 2-0 CT1 TAPERPNT 27 (SUTURE) ×4 IMPLANT
SUT VIC AB 3-0 SH 27 (SUTURE) ×8
SUT VIC AB 3-0 SH 27X BRD (SUTURE) ×4 IMPLANT
SYR 3ML LL SCALE MARK (SYRINGE) IMPLANT
TOWEL GREEN STERILE (TOWEL DISPOSABLE) ×2 IMPLANT
TRAY FOLEY MTR SLVR 16FR STAT (SET/KITS/TRAYS/PACK) ×2 IMPLANT
UNDERPAD 30X36 HEAVY ABSORB (UNDERPADS AND DIAPERS) ×2 IMPLANT
WATER STERILE IRR 1000ML POUR (IV SOLUTION) ×2 IMPLANT

## 2023-07-06 NOTE — H&P (Signed)
History and Physical Interval Note:  07/06/2023 1:59 PM  [name]  has presented today for surgery, with the diagnosis of Pseudoaneurysm of femoral artery; S/P femoral-femoral bypass surgery.  The various methods of treatment have been discussed with the patient and family. After consideration of risks, benefits and other options for treatment, the patient has consented to  Procedure(s): LEFT FEMORAL PSEUDOANEURYSM REPAIR (Left) REVISION OF FEMORAL-FEMORAL ARTERY BYPASS (Bilateral) as a surgical intervention.  The patient's history has been reviewed, patient examined, no change in status, stable for surgery.  I have reviewed the patient's chart and labs.  Questions were answered to the patient's satisfaction.     Andrea [name]     Patient name: Andrea [name]     MRN: [id number]        DOB: 21-Sep-1966          Sex: female   REASON FOR CONSULT: Follow-up after CTA for evaluation of suspected left femoral pseudoaneurysm in setting of right to left femorofemoral bypass   HPI: Andrea [name] is a 57 y.o. 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  Patient was seen several weeks ago for surveillance.  She states about 3 months ago she had noticed a mass in her left groin that has been growing.   Patient initially underwent right to left femorofemoral bypass by Dr. Madilyn [name] in 2003 using 7 mm Dacron.  She had to have a revision later with bilateral femoral patch angioplasty in 2005. She has also undergone a right common iliac artery stent here by Dr. Madilyn [name] and another iliac stent at [facility].      She was previously seen for follow-up with recurrent bilateral lower extremity claudication.  I sent her for CTA to evaluate for aortic disease versus iliac disease in stents versus an issue with her femorofemoral bypass.   Ultimately we elected to delay intervention and continue surveillance.           Past Medical History:  Diagnosis Date   Allergy     Anemia     Anxiety      on Alprazolam   Arthritis      both hips   Bilateral carotid artery stenosis 10/18/2014   Cancer (HCC)      skin cancer   Carotid artery occlusion      a. s/p prior endarterectomies with known RICA occlusion - followed by [facility].   CVA (cerebral infarction) 06/22/2014   Depression      on Effexor   Diabetes mellitus with circulatory complication (HCC)      2011   Diastolic dysfunction without heart failure     DM (diabetes mellitus) with complications (HCC) 08/29/2014   Heart murmur      was seen by cardiologist in [facility] 2013   History of kidney stones     HLD (hyperlipidemia) 08/29/2014   HTN (hypertension), benign 08/29/2014   Hyperlipidemia     Hypertension     Occlusion and stenosis of carotid artery without mention of cerebral infarction 04/12/2014   PVD (peripheral vascular disease) (HCC)      a. s/p prior LE bypass grafting and revision (followed by [facility]).   Shingles 06/07/2016   Stroke (HCC) 06/22/2014   TIA (transient ischemic attack)      a. possible mini stroke in 2015 - was told she did not have  full stroke.               Past Surgical History:  Procedure Laterality Date   2 stents       ABDOMINAL AORTOGRAM W/LOWER EXTREMITY N/A 01/20/2019    Procedure: ABDOMINAL AORTOGRAM W/LOWER EXTREMITY;  Surgeon: Andrea Shelling, MD;  Location: [facility];  Service: Cardiovascular;  Laterality: N/A;   CAROTID ENDARTERECTOMY       CESAREAN SECTION       CHOLECYSTECTOMY       EXTRACORPOREAL SHOCK WAVE LITHOTRIPSY Left 03/29/2018    Procedure: LEFT EXTRACORPOREAL SHOCK WAVE LITHOTRIPSY (ESWL);  Surgeon: Marcine Matar, MD;  Location: [facility];  Service: Urology;  Laterality: Left;   FEMORAL BYPASS        x 2 on right side due to re-stenosis   left/right carotid artery        2003 and 2009 for right, right  side is 100% occluded per patient; left side 2009   TUBAL LIGATION                   Family History  Problem Relation Age of Onset   Diabetes Mother     Heart disease Mother     Hyperlipidemia Mother     Hypertension Mother     Kidney failure Mother     Heart disease Father          MI, unknown age   Hyperlipidemia Father     Hypertension Father     Skin cancer Sister     Varicose Veins Sister     Heart attack Brother          Age 70   Diabetes Maternal Grandmother     Heart disease Maternal Grandmother     Hyperlipidemia Maternal Grandmother     Cancer Paternal Grandmother          unknown type- possibly stomach   Liver disease Son          fatty liver   Colon cancer Neg Hx     Esophageal cancer Neg Hx            SOCIAL HISTORY: Social History         Socioeconomic History   Marital status: Married      Spouse name: Not on file   Number of children: 1   Years of education: College   Highest education level: Not on file  Occupational History   Occupation: retired/disabled      Associate Professor: UNEMPLOYED  Tobacco Use   Smoking status: Every Day      Packs/day: 1.00      Years: 40.00      Additional pack years: 0.00      Total pack years: 40.00      Types: Cigarettes   Smokeless tobacco: Never  Vaping Use   Vaping Use: Never used  Substance and Sexual Activity   Alcohol use: Yes      Alcohol/week: 0.0 standard drinks of alcohol      Comment: holidays/special occas 3-4 drinks   Drug use: No   Sexual activity: Not Currently      Partners: Male  Other Topics Concern   Not on file  Social History Narrative    Pt is from [facility]    Patient lives at home with her husband and son    Patient is right handed    Patient drinks coffee daily    Social Determinants of Health  Financial Resource Strain: Not on file  Food Insecurity: Not on file  Transportation Needs: Not on file  Physical Activity: Inactive (07/05/2018)    Exercise Vital Sign      Days of Exercise per Week: 0 days     Minutes of Exercise per Session: 0 min  Stress: Stress Concern Present (07/05/2018)    [employer] of Occupational Health - Occupational Stress Questionnaire     Feeling of Stress : To some extent  Social Connections: Socially Integrated (07/05/2018)    Social Connection and Isolation Panel [NHANES]     Frequency of Communication with Friends and Family: More than three times a week     Frequency of Social Gatherings with Friends and Family: Twice a week     Attends Religious Services: 1 to 4 times per year     Active Member of [employer] or Organizations: Yes     Attends Banker Meetings: 1 to 4 times per year     Marital Status: Married  Catering manager Violence: Not At Risk (07/05/2018)    Humiliation, Afraid, Rape, and Kick questionnaire     Fear of Current or Ex-Partner: No     Emotionally Abused: No     Physically Abused: No     Sexually Abused: No      Allergies       Allergies  Allergen Reactions   Atorvastatin        Muscle cramps   Iohexol Hives      Patient should take 13 hours prep    Other Hives      Contrast dye     Rosuvastatin Nausea And Vomiting      Nausea/vomiting              Current Outpatient Medications  Medication Sig Dispense Refill   diphenhydrAMINE (BENADRYL) 50 MG capsule Take one capsule 1 hour prior to scan. 1 capsule 0   predniSONE (DELTASONE) 50 MG tablet Take one tablet 13 hours, 7 hours, and 1 hour prior to scan. 3 tablet 0   amLODipine (NORVASC) 10 MG tablet Take 1 tablet (10 mg total) by mouth daily. 90 tablet 3   aspirin 81 MG tablet Take 1 tablet (81 mg total) by mouth daily. 30 tablet 11   cetirizine (ZYRTEC) 10 MG tablet Take 10 mg by mouth as needed for allergies.       clopidogrel (PLAVIX) 75 MG tablet Take 1 tablet (75 mg total) by mouth daily. 90 tablet 3   Evolocumab (REPATHA SURECLICK) 140 MG/ML SOAJ Inject 140 mg into the skin every 14 (fourteen) days. 6 mL 3   fluconazole  (DIFLUCAN) 100 MG tablet Take 1 tablet (100 mg total) by mouth daily. 28 tablet 0   fluticasone (FLONASE) 50 MCG/ACT nasal spray Place 2 sprays into both nostrils as needed for allergies or rhinitis.       Lancets MISC Test blood sugar once daily. 100 each 2   lisinopril (PRINIVIL,ZESTRIL) 2.5 MG tablet Take 1 tablet (2.5 mg total) by mouth daily. 90 tablet 3   metFORMIN (GLUCOPHAGE) 1000 MG tablet Take 1 tablet (1,000 mg total) by mouth 2 (two) times daily with a meal. 180 tablet 3   omeprazole (PRILOSEC) 40 MG capsule Take 1 capsule (40 mg total) by mouth daily. 30 capsule 4   simvastatin (ZOCOR) 20 MG tablet Take 1 tablet (20 mg total) by mouth at bedtime. 90 tablet 3   TRELEGY ELLIPTA 200-62.5-25 MCG/ACT AEPB Inhale into the lungs.  venlafaxine XR (EFFEXOR-XR) 150 MG 24 hr capsule Take 150 mg by mouth daily with breakfast.                   Current Facility-Administered Medications  Medication Dose Route Frequency Provider Last Rate Last Admin   0.9 %  sodium chloride infusion  500 mL Intravenous Continuous Armbruster, Willaim Rayas, MD            REVIEW OF SYSTEMS:  [X]  denotes positive finding, [ ]  denotes negative finding Cardiac   Comments:  Chest pain or chest pressure:      Shortness of breath upon exertion:      Short of breath when lying flat:      Irregular heart rhythm:             Vascular      Pain in calf, thigh, or hip brought on by ambulation:      Pain in feet at night that wakes you up from your sleep:       Blood clot in your veins:      Leg swelling:              Pulmonary      Oxygen at home:      Productive cough:       Wheezing:              Neurologic      Sudden weakness in arms or legs:       Sudden numbness in arms or legs:       Sudden onset of difficulty speaking or slurred speech:      Temporary loss of vision in one eye:       Problems with dizziness:              Gastrointestinal      Blood in stool:       Vomited blood:               Genitourinary      Burning when urinating:       Blood in urine:             Psychiatric      Major depression:              Hematologic      Bleeding problems:      Problems with blood clotting too easily:             Skin      Rashes or ulcers:             Constitutional      Fever or chills:          PHYSICAL EXAM: There were no vitals filed for this visit.     GENERAL: The patient is a well-nourished female, in no acute distress. The vital signs are documented above. CARDIAC: There is a regular rate and rhythm.  VASCULAR:  Palpable pulse in fem fem bypass graft Pulsatile mass left groin No palpable pedal pulses but no tissue loss PULMONARY: No respiratory distress. ABDOMEN: Soft and non-tender. MUSCULOSKELETAL: There are no major deformities or cyanosis. NEUROLOGIC: No focal weakness or paresthesias are detected.   DATA:    CTA aortobifem reviewed from 06/23/2023 with 8 cm left femoral anastomotic pseudoaneurysm in the setting of right to left femoral-femoral bypass   Assessment/Plan:   57 year old female presents for follow-up after CTA for evaluation of suspected left femoral pseudoaneurysm.  She was seen several weeks  ago for surveillance of her femoral-femoral bypass graft initially placed in 2003 by Dr. Madilyn [name].  Her femoral femoral duplex several weeks ago showed significant hematoma around the graft in the left groin and on exam she has a pulsatile mass here.  CTA confirms anastomotic pseudoaneurysm over 8 cm in the left groin.  I have recommended operative intervention.  Discussed this would involve repair of the femoral pseudoaneurysm in her left groin with likely revision of her femoral-femoral bypass graft.  I discussed risk and benefits including wound healing problems and infection of the bypass graft in addition to vessel injury, risk of anesthesia, as well as pulmonary renal and cardiac complications.  Will schedule for Monday.     Andrea Shelling,  MD [facility] [place] Office: [phone]   Andrea [name]

## 2023-07-06 NOTE — Anesthesia Procedure Notes (Signed)
Procedure Name: Intubation Date/Time: 07/06/2023 3:04 PM  Performed by: Dairl Ponder, CRNAPre-anesthesia Checklist: Patient identified, Emergency Drugs available, Suction available and Patient being monitored Patient Re-evaluated:Patient Re-evaluated prior to induction Oxygen Delivery Method: Circle System Utilized Preoxygenation: Pre-oxygenation with 100% oxygen Induction Type: IV induction Ventilation: Mask ventilation without difficulty Laryngoscope Size: Mac and 3 Grade View: Grade I Tube type: Oral Tube size: 7.0 mm Number of attempts: 1 Airway Equipment and Method: Stylet and Oral airway Placement Confirmation: ETT inserted through vocal cords under direct vision, positive ETCO2 and breath sounds checked- equal and bilateral Secured at: 21 cm Tube secured with: Tape Dental Injury: Teeth and Oropharynx as per pre-operative assessment

## 2023-07-06 NOTE — Anesthesia Procedure Notes (Signed)
Arterial Line Insertion Start/End7/15/2024 11:00 AM, 07/06/2023 11:10 AM Performed by: Freddi Starr T, CRNA  Preanesthetic checklist: patient identified, IV checked, site marked, risks and benefits discussed, surgical consent, monitors and equipment checked, pre-op evaluation, timeout performed and anesthesia consent Lidocaine 1% used for infiltration Right, radial was placed Catheter size: 20 G Hand hygiene performed  and maximum sterile barriers used   Attempts: 2 Procedure performed using ultrasound guided technique. Ultrasound Notes:anatomy identified and needle tip was noted to be adjacent to the nerve/plexus identified Following insertion, dressing applied and Biopatch. Post procedure assessment: normal and unchanged  Patient tolerated the procedure well with no immediate complications.

## 2023-07-06 NOTE — Op Note (Signed)
Date: July 06, 2023  Preoperative diagnosis: Left femoral anastomotic pseudoaneurysm in the setting of remote right to left femoral-femoral bypass  Postoperative diagnosis: Same  Procedure: 1.  Redo exposure of left common femoral artery greater than 30 days 2.  Repair of left common femoral pseudoaneurysm 3.  Revision of right to left femoral-femoral bypass with 8 mm Dacron interposition and new left common femoral anastomosis 4.  Left femoral endarterectomy with endarterectomy of the proximal SFA 5.  Application of skin substitute left groin wound (500 mg myriad morcels) 6.  Left groin incisional wound vac  Surgeon: Dr. Cephus Shelling, MD  Co-surgeon: Dr. Gillis Santa, MD  Assistant: Doreatha Massed, PA  Indications: 57 year old female that previously underwent a right to left femorofemoral bypass by Dr. Madilyn Fireman in 2003.  She had multiple bilateral groin revisions in 2005.  She has undergone right iliac stenting.  She was seen in the office for surveillance and had evidence of a new large left common femoral pseudoaneurysm over 8 cm.  She presents for revision after risks benefits discussed.  Co-surgeon was required given the complexity of the case as well as for sewing the new multiple anastomosis.  Findings: Large left femoral pseudoaneurysm over 8 cm.  Initially started in the left groin and we could not get proximal control  of the femoral femoral graft given this was degenerated over toward the pubis.  Ultimately made a counterincision over the femorofemoral graft on the right side of the abdominal wall to get proximal control.  The SFA and profunda were dissected out.  We entered the pseudoaneurysm and this was completely evacuated.  The Dacron was completely dehisced from the left common femoral artery.  The left common femoral required endarterectomy including the 2 main profunda branches and proximal SFA.  A new 8 mm dacroon graft was sewn end to end from the counterincision to the  old femoral femoral graft and tunneled through a new skin tunnel and then sewn to the left common femoral artery.  Brisk profunda and SFA signals at completion.  Ultimately after hemostasis was obtained I did repair some fascia on the abdominal wall with #1 PDS.  We placed myriad morcels in the wound.  This was closed in multiple layers.  An incisional VAC was applied to the left groin.  Anesthesia: General  Details: Patient was taken to the operating room after informed consent was obtained.  Placed on operative table in the supine position.  General endotracheal anesthesia was induced.  Antibiotics were given and timeout performed.  Dr. Karin Lieu and I initially started in the left groin and reopened the previous oblique groin incision.  We dissected down and dissected around the large femoral pseudoaneurysm.  We really could not get proximal control given that that was degenerated over toward the pubis.  We then made a counterincision on the right abdominal wall over the femorofemoral graft and the graft was dissected out for proximal inflow control.  We then continued to dissect around the femoral pseudoaneurysm in the left groin and dissected out the common femoral as well as the SFA and profunda.  All these were controlled Vesseloops.  Patient was given 100 units/kg IV heparin.  ACT was checked to maintain greater than 250.  We then dissected into the left femoral pseudoaneurysm and evacuated all of this.  I resected all this down the wall of the left common femoral.  The left common femoral was significantly diseased.  I did use Potts scissors and opened the left  common femoral down to the takeoff of the SFA profunda and extensive endarterectomy was performed with a Runner, broadcasting/film/video with the help of Dr. Karin Lieu.  We then tunneled a new 8 mm dacron graft through a new skin tunnel lateral to the existing femorofemoral graft.  We removed all of the anastomotic pseudoaneurysm and old dacron.  We then performed  an end to end anastmosis with new dacron graft in the counterincision over the pubis with 5-0 Prolene parachute technique.  This graft  was de-aired.  We then performed an end to side anastomosis to the left common femoral artery after endarterectomy.  We then had good signals in the left foot.  Protamine was given for reversal.  The groin was irrigated out and got hemostasis.  I placed myriad morcels in the left groin wound and this was closed in multiple layers of 2-0 Vicryl, 3-0 Vicryl and staples and skin with a Praveena wound VAC.  We then closed the counterincisions well with 3-0 Vicryl 4-0 Monocryl Dermabond.  Complication: None  Condition: Stable  Cephus Shelling, MD [facility] [place] Office: [phone]   Cephus Shelling

## 2023-07-06 NOTE — Discharge Instructions (Signed)
 [facility] [place]  Discharge instructions  Lower Extremity Bypass Surgery  Please refer to the following instruction for your post-procedure care. Your surgeon or physician assistant will discuss any changes with you.  Activity  You are encouraged to walk as much as you can. You can slowly return to normal activities during the month after your surgery. Avoid strenuous activity and heavy lifting until your doctor tells you it's OK. Avoid activities such as vacuuming or swinging a golf club. Do not drive until your doctor give the OK and you are no longer taking prescription pain medications. It is also normal to have difficulty with sleep habits, eating and bowel movement after surgery. These will go away with time.  Bathing/Showering  Shower daily after you go home. Do not soak in a bathtub, hot tub, or swim until the incision heals completely.  Incision Care  Clean your incision with mild soap and water. Shower every day. Pat the area dry with a clean towel. You do not need a bandage unless otherwise instructed. Do not apply any ointments or creams to your incision. If you have open wounds you will be instructed how to care for them or a visiting nurse may be arranged for you. If you have staples or sutures along your incision they will be removed at your post-op appointment. You may have skin glue on your incision. Do not peel it off. It will come off on its own in about one week.  Keep Pravena wound vac on your groin incision until it loses it seal in about 7-10 days.  Once that happens, you can remove and then wash the groin wound with soap and water daily and pat dry. (No tub bath-only shower)  Then put a dry gauze or washcloth in the groin to keep this area dry to help prevent wound infection.  Do this daily and as needed.  Do not use Vaseline or neosporin on your incisions.  Only use soap and water on your incisions and then protect and keep  dry.   Diet  Resume your normal diet. There are no special food restrictions following this procedure. A low fat/ low cholesterol diet is recommended for all patients with vascular disease. In order to heal from your surgery, it is CRITICAL to get adequate nutrition. Your body requires vitamins, minerals, and protein. Vegetables are the best source of vitamins and minerals. Vegetables also provide the perfect balance of protein. Processed food has little nutritional value, so try to avoid this.  Medications  Resume taking all your medications unless your doctor or physician assistant tells you not to. If your incision is causing pain, you may take over-the-counter pain relievers such as acetaminophen (Tylenol). If you were prescribed a stronger pain medication, please aware these medication can cause nausea and constipation. Prevent nausea by taking the medication with a snack or meal. Avoid constipation by drinking plenty of fluids and eating foods with high amount of fiber, such as fruits, vegetables, and grains. Take Colace 100 mg (an over-the-counter stool softener) twice a day as needed for constipation.  Do not take Tylenol if you are taking prescription pain medications.  Follow Up  Our office will schedule a follow up appointment 2-3 weeks following discharge.  Please call us immediately for any of the following conditions  Severe or worsening pain in your legs or feet while at rest or while walking Increase pain, redness, warmth, or drainage (pus) from your incision site(s) Fever of 101   degree or higher The swelling in your leg with the bypass suddenly worsens and becomes more painful than when you were in the hospital If you have been instructed to feel your graft pulse then you should do so every day. If you can no longer feel this pulse, call the office immediately. Not all patients are given this instruction.  Leg swelling is common after leg bypass surgery.  The swelling should  improve over a few months following surgery. To improve the swelling, you may elevate your legs above the level of your heart while you are sitting or resting. Your surgeon or physician assistant may ask you to apply an ACE wrap or wear compression (TED) stockings to help to reduce swelling.  Reduce your risk of vascular disease  Stop smoking. If you would like help call [facility] at 1-800-QUIT-NOW (1-800-784-8669) or [place] at 336-586-4000.  Manage your cholesterol Maintain a desired weight Control your diabetes weight Control your diabetes Keep your blood pressure down  If you have any questions, please call the office at [phone]  

## 2023-07-06 NOTE — Anesthesia Preprocedure Evaluation (Signed)
Anesthesia Evaluation  Patient identified by MRN, date of birth, ID band Patient awake    Reviewed: Allergy & Precautions, H&P , NPO status , Patient's Chart, lab work & pertinent test results  Airway Mallampati: II   Neck ROM: full    Dental   Pulmonary asthma , COPD, Current Smoker   breath sounds clear to auscultation       Cardiovascular hypertension, + Peripheral Vascular Disease   Rhythm:regular Rate:Normal     Neuro/Psych  PSYCHIATRIC DISORDERS Anxiety Depression    CVA    GI/Hepatic ,GERD  ,,  Endo/Other  diabetes, Type 2    Renal/GU      Musculoskeletal  (+) Arthritis ,    Abdominal   Peds  Hematology   Anesthesia Other Findings   Reproductive/Obstetrics                             Anesthesia Physical Anesthesia Plan  ASA: 3  Anesthesia Plan: General   Post-op Pain Management:    Induction: Intravenous  PONV Risk Score and Plan: 2 and Ondansetron, Dexamethasone, Midazolam and Treatment may vary due to age or medical condition  Airway Management Planned: Oral ETT  Additional Equipment: Arterial line  Intra-op Plan:   Post-operative Plan: Extubation in OR  Informed Consent: I have reviewed the patients History and Physical, chart, labs and discussed the procedure including the risks, benefits and alternatives for the proposed anesthesia with the patient or authorized representative who has indicated his/her understanding and acceptance.     Dental advisory given  Plan Discussed with: CRNA, Anesthesiologist and Surgeon  Anesthesia Plan Comments:        Anesthesia Quick Evaluation

## 2023-07-06 NOTE — Transfer of Care (Signed)
Immediate Anesthesia Transfer of Care Note  Patient: SHAINDEL SWEETEN  Procedure(s) Performed: REPAIR OF LEFT FEMORAL PSEUDOANEURYSM, LEFT COMMON FEMORAL ENDARECTOMY, REDO EXPOSURE OF LEFT COMMON FEMORAL ARTERY GREATER THAN 30 DAYS, APPLICATION OF MYRIDAD POWDER, APPLICATION OF WOUND VAC SYSTEM (Left) REVISION OF FEMORAL-FEMORAL ARTERY BYPASS WITH NEW INTERPOSITION (Bilateral)  Patient Location: PACU  Anesthesia Type:General  Level of Consciousness: awake, alert , and oriented  Airway & Oxygen Therapy: Patient Spontanous Breathing and Patient connected to face mask oxygen  Post-op Assessment: BP treated with Labetalol IV  Post vital signs: Reviewed and stable  Last Vitals:  Vitals Value Taken Time  BP 202/91 07/06/23 1816  Temp 36.4 C 07/06/23 1815  Pulse 78 07/06/23 1824  Resp 13 07/06/23 1824  SpO2 93 % 07/06/23 1824  Vitals shown include unfiled device data.  Last Pain:  Vitals:   07/06/23 1131  PainSc: 0-No pain         Complications: No notable events documented.

## 2023-07-07 LAB — LIPID PANEL
Cholesterol: 86 mg/dL (ref 0–200)
HDL: 23 mg/dL — ABNORMAL LOW (ref >40)
LDL Cholesterol: 44 mg/dL (ref 0–99)
Total CHOL/HDL Ratio: 3.7 RATIO
Triglycerides: 94 mg/dL (ref <150)
VLDL: 19 mg/dL (ref 0–40)

## 2023-07-07 LAB — CBC
HCT: 26.9 % — ABNORMAL LOW (ref 36.0–46.0)
Hemoglobin: 8.7 g/dL — ABNORMAL LOW (ref 12.0–15.0)
MCH: 28.8 pg (ref 26.0–34.0)
MCHC: 32.3 g/dL (ref 30.0–36.0)
MCV: 89.1 fL (ref 80.0–100.0)
Platelets: 235 10*3/uL (ref 150–400)
RBC: 3.02 MIL/uL — ABNORMAL LOW (ref 3.87–5.11)
RDW: 14.9 % (ref 11.5–15.5)
WBC: 17 10*3/uL — ABNORMAL HIGH (ref 4.0–10.5)
nRBC: 0 % (ref 0.0–0.2)

## 2023-07-07 LAB — GLUCOSE, CAPILLARY
Glucose-Capillary: 174 mg/dL — ABNORMAL HIGH (ref 70–99)
Glucose-Capillary: 172 mg/dL — ABNORMAL HIGH (ref 70–99)
Glucose-Capillary: 170 mg/dL — ABNORMAL HIGH (ref 70–99)
Glucose-Capillary: 157 mg/dL — ABNORMAL HIGH (ref 70–99)

## 2023-07-07 LAB — BASIC METABOLIC PANEL
Anion gap: 11 (ref 5–15)
BUN: 6 mg/dL (ref 6–20)
CO2: 23 mmol/L (ref 22–32)
Calcium: 8 mg/dL — ABNORMAL LOW (ref 8.9–10.3)
Chloride: 100 mmol/L (ref 98–111)
Creatinine, Ser: 0.61 mg/dL (ref 0.44–1.00)
GFR, Estimated: >60 mL/min (ref >60)
Glucose, Bld: 183 mg/dL — ABNORMAL HIGH (ref 70–99)
Potassium: 4.1 mmol/L (ref 3.5–5.1)
Sodium: 134 mmol/L — ABNORMAL LOW (ref 135–145)

## 2023-07-07 NOTE — Progress Notes (Addendum)
Progress Note    07/07/2023 6:41 AM 1 Day Post-Op  Subjective:  says she's a little sore.   Afebrile HR 60's-70's NSR 110's-150's systolic 97% 2LO2NC  Vitals:   07/07/23 0400 07/07/23 0600  BP: 117/61 (!) 130/58  Pulse: 66   Resp: 16 16  Temp: 97.8 F (36.6 C)   SpO2: 96% 96%    Physical Exam: General:  sitting in bed in no distress Cardiac:  regular Lungs:  non labored Incisions:  right sided incision clean and dry; prevena in tact to left groin.  Mild fullness over left groin and fem fem but soft.  Extremities:  +doppler flow right AT and left DP; left calf is soft and non tender.  Abdomen:  soft  CBC    Component Value Date/Time   WBC 17.0 (H) 07/07/2023 0420   RBC 3.02 (L) 07/07/2023 0420   HGB 8.7 (L) 07/07/2023 0420   HGB 14.5 08/10/2019 1626   HCT 26.9 (L) 07/07/2023 0420   HCT 43.9 08/10/2019 1626   PLT 235 07/07/2023 0420   PLT 360 08/10/2019 1626   MCV 89.1 07/07/2023 0420   MCV 89 08/10/2019 1626   MCH 28.8 07/07/2023 0420   MCHC 32.3 07/07/2023 0420   RDW 14.9 07/07/2023 0420   RDW 13.5 08/10/2019 1626   LYMPHSABS 3.4 04/09/2023 1155   LYMPHSABS 4.0 (H) 06/09/2018 1516   MONOABS 0.8 04/09/2023 1155   EOSABS 0.2 04/09/2023 1155   EOSABS 0.3 06/09/2018 1516   BASOSABS 0.1 04/09/2023 1155   BASOSABS 0.1 06/09/2018 1516    BMET    Component Value Date/Time   NA 134 (L) 07/07/2023 0420   NA 140 04/11/2021 0916   K 4.1 07/07/2023 0420   CL 100 07/07/2023 0420   CO2 23 07/07/2023 0420   GLUCOSE 183 (H) 07/07/2023 0420   BUN 6 07/07/2023 0420   BUN 11 04/11/2021 0916   CREATININE 0.61 07/07/2023 0420   CREATININE 0.59 01/03/2016 1752   CALCIUM 8.0 (L) 07/07/2023 0420   GFRNONAA >60 07/07/2023 0420   GFRNONAA >89 01/03/2016 1752   GFRAA 118 08/10/2019 1626   GFRAA >89 01/03/2016 1752    INR    Component Value Date/Time   INR 1.0 07/03/2023 1111     Intake/Output Summary (Last 24 hours) at 07/07/2023 0641 Last data filed at  07/07/2023 0272 Gross per 24 hour  Intake 4350.3 ml  Output 1800 ml  Net 2550.3 ml      Assessment/Plan:  57 y.o. female is s/p:  Redo exposure of left CFA, endarterectomy left femoral artery, repair left CFA psa, revision of right to left femoral femoral BPG, Prevena vac placement left groin 1 Day Post-Op   -pt with +doppler signals bilateral feet.  No evidence of compartment syndrome.  -acute surgical blood loss anemia-pt currently tolerating.   Prevena in tact with good seal.  Discussed with pt that this is incisional vac.   -leukocytosis-most likely related to periop but will continue to monitor.  Pt scheduled to get Ancef x 3 days.  -hyperglycemia-SSI ordered.  Metformin to restart this am. -DVT prophylaxis:  SCD's for now due to high risk of bleeding.  Continue to hold Plavix today.  -mobilize out of bed.    Doreatha Massed, PA-C [facility] [phone] 07/07/2023 6:41 AM  I have seen and evaluated the patient. I agree with the PA note as documented above.  Postop day 1 status post repair of left common femoral pseudoaneurysm including revision of  her right to left femoral-femoral bypass graft and left femoral endarterectomy.  Incisions look great.  She has an incisional VAC in the left groin.  Left DP weakly palpable.  Continue Ancef.  Out of bed and mobilize today.  Will hold restarting Plavix as she was very oozy yesterday.  Cephus Shelling, MD [facility] [place] Office: [phone]

## 2023-07-07 NOTE — Anesthesia Postprocedure Evaluation (Signed)
Anesthesia Post Note  Patient: CONNA TERADA  Procedure(s) Performed: REPAIR OF LEFT FEMORAL PSEUDOANEURYSM, LEFT COMMON FEMORAL ENDARECTOMY, REDO EXPOSURE OF LEFT COMMON FEMORAL ARTERY GREATER THAN 30 DAYS, APPLICATION OF MYRIDAD POWDER, APPLICATION OF WOUND VAC SYSTEM (Left) REVISION OF FEMORAL-FEMORAL ARTERY BYPASS WITH NEW INTERPOSITION (Bilateral)     Patient location during evaluation: PACU Anesthesia Type: General Level of consciousness: awake and alert Pain management: pain level controlled Vital Signs Assessment: post-procedure vital signs reviewed and stable Respiratory status: spontaneous breathing, nonlabored ventilation, respiratory function stable and patient connected to nasal cannula oxygen Cardiovascular status: blood pressure returned to baseline and stable Postop Assessment: no apparent nausea or vomiting Anesthetic complications: no   No notable events documented.  Last Vitals:  Vitals:   07/07/23 1047 07/07/23 1639  BP: (!) 149/47 (!) 137/53  Pulse: 88 82  Resp: 17 20  Temp: 37.1 C 37.2 C  SpO2: 96% 98%    Last Pain:  Vitals:   07/07/23 1639  TempSrc: Oral  PainSc:                  Graig Hessling S

## 2023-07-07 NOTE — Evaluation (Addendum)
Physical Therapy Evaluation Patient Details Name: Andrea [name] MRN: [id number] DOB: 04-14-1966 Today's Date: 07/07/2023  History of Present Illness  57 y.o. female admitted 07/06/23 for Redo exposure of left CFA, endarterectomy left femoral artery, repair left CFA psa, revision of right to left femoral femoral BPG, Prevena vac placement left groin.PMH positive for CVA, DM, HTN, HLD, PVD, CEA, previous LE byposs graft and revision.  Clinical Impression  Patient presents with mobility limited by pain, decreased activity tolerance and decreased L LE strength.  She normally is independent at home, and today able to ambulate with S to minguard A with RW in hallway.  She is eager to progress but had lot of pain sitting up in recliner to after ambulation assisted to supine.  Encouraged frequent mobility with staff assistance and informed mobility team to ensure ambulation offered again today.  Feel she will progress well and not need follow up PT at d/c, but will continue to follow in acute setting.         Assistance Recommended at Discharge Intermittent Supervision/Assistance  If plan is discharge home, recommend the following:  Can travel by private vehicle  Help with stairs or ramp for entrance;Assist for transportation;Assistance with cooking/housework        Equipment Recommendations None recommended by PT  Recommendations for Other Services       Functional Status Assessment Patient has had a recent decline in their functional status and demonstrates the ability to make significant improvements in function in a reasonable and predictable amount of time.     Precautions / Restrictions Precautions Precautions: Fall      Mobility  Bed Mobility Overal bed mobility: Needs Assistance Bed Mobility: Sit to Supine       Sit to supine: Supervision   General bed mobility comments: increased time and effort to lift L LE into bed    Transfers Overall transfer level: Needs  assistance Equipment used: Rolling walker (2 wheels) Transfers: Sit to/from Stand Sit to Stand: Supervision           General transfer comment: A for lines    Ambulation/Gait Ambulation/Gait assistance: Supervision, Min guard Gait Distance (Feet): 120 Feet Assistive device: Rolling walker (2 wheels) Gait Pattern/deviations: Step-through pattern, Decreased stride length       General Gait Details: mildly antalgic, moving with RW well  Stairs            Wheelchair Mobility     Tilt Bed    Modified Rankin (Stroke Patients Only)       Balance                                             Pertinent Vitals/Pain Pain Assessment Pain Assessment: Faces Faces Pain Scale: Hurts whole lot Pain Location: sore after up in chair Pain Descriptors / Indicators: Constant, Sore, Throbbing, Crying Pain Intervention(s): Monitored during session, Repositioned    Home Living Family/patient expects to be discharged to:: Private residence Living Arrangements: Spouse/significant other;Children (adult son) Available Help at Discharge: Family;Available 24 hours/day (son able to assist, spouse is disabled) Type of Home: House Home Access: Stairs to enter Entrance Stairs-Rails: None Entrance Stairs-Number of Steps: 2   Home Layout: One level Home Equipment: Agricultural consultant (2 wheels);Rollator (4 wheels)      Prior Function Prior Level of Function : Independent/Modified Independent;Driving  Mobility Comments: independent. Sometimes in the winter time, her hip arhritis will kick in and she'll use her RW or rollator ADLs Comments: independent     Hand Dominance   Dominant Hand: Right    Extremity/Trunk Assessment   Upper Extremity Assessment Upper Extremity Assessment: Defer to OT evaluation    Lower Extremity Assessment Lower Extremity Assessment: RLE deficits/detail;LLE deficits/detail RLE Deficits / Details: grossly WFL, but sore esp  at groin on L LLE Deficits / Details: grossly WFL, but sore esp at groin on L       Communication   Communication: No difficulties  Cognition Arousal/Alertness: Awake/alert Behavior During Therapy: WFL for tasks assessed/performed Overall Cognitive Status: Within Functional Limits for tasks assessed                                          General Comments General comments (skin integrity, edema, etc.): preveena wound vac intact;  HR max 122 with ambulation in hallway    Exercises     Assessment/Plan    PT Assessment Patient needs continued PT services  PT Problem List Decreased mobility;Decreased activity tolerance;Pain;Decreased balance       PT Treatment Interventions DME instruction;Functional mobility training;Balance training;Patient/family education;Gait training;Therapeutic exercise;Stair training;Therapeutic activities    PT Goals (Current goals can be found in the Care Plan section)  Acute Rehab PT Goals Patient Stated Goal: to return to independent PT Goal Formulation: With patient Time For Goal Achievement: 07/14/23 Potential to Achieve Goals: Good    Frequency Min 1X/week     Co-evaluation               AM-PAC PT "6 Clicks" Mobility  Outcome Measure Help needed turning from your back to your side while in a flat bed without using bedrails?: A Little Help needed moving from lying on your back to sitting on the side of a flat bed without using bedrails?: A Little Help needed moving to and from a bed to a chair (including a wheelchair)?: A Little Help needed standing up from a chair using your arms (e.g., wheelchair or bedside chair)?: A Little Help needed to walk in hospital room?: A Little Help needed climbing 3-5 steps with a railing? : Total 6 Click Score: 16    End of Session Equipment Utilized During Treatment: Gait belt Activity Tolerance: Patient tolerated treatment well Patient left: in bed;with call bell/phone within  reach   PT Visit Diagnosis: Difficulty in walking, not elsewhere classified (R26.2);Pain Pain - Right/Left: Left Pain - part of body: Hip    Time: 1025-1050 PT Time Calculation (min) (ACUTE ONLY): 25 min   Charges:   PT Evaluation $PT Eval Low Complexity: 1 Low PT Treatments $Gait Training: 8-22 mins PT General Charges $$ ACUTE PT VISIT: 1 Visit         Sheran Lawless, PT Acute Rehabilitation Services Office:[phone] 07/07/2023   Elray Mcgregor 07/07/2023, 1:47 PM

## 2023-07-07 NOTE — Progress Notes (Signed)
PHARMACIST LIPID MONITORING   Andrea [name] is a 57 y.o. female admitted on 07/06/2023 with peripheral arterial disease.  Pharmacy has been consulted to optimize lipid-lowering therapy with the indication of secondary prevention for clinical ASCVD.  Recent Labs:  Lipid Panel (last 6 months):   Lab Results  Component Value Date   CHOL 86 07/07/2023   TRIG 94 07/07/2023   HDL 23 (L) 07/07/2023   CHOLHDL 3.7 07/07/2023   VLDL 19 07/07/2023   LDLCALC 44 07/07/2023    Hepatic function panel (last 6 months):   Lab Results  Component Value Date   AST 15 07/03/2023   ALT 12 07/03/2023   ALKPHOS 94 07/03/2023   BILITOT 0.3 07/03/2023    SCr (since admission):   Serum creatinine: 0.61 mg/dL 84/13/24 4010 Estimated creatinine clearance: 62.6 mL/min  Current therapy and lipid therapy tolerance Current lipid-lowering therapy: Simvastatin 20 mg  Previous lipid-lowering therapies (if applicable): N/a Documented or reported allergies or intolerances to lipid-lowering therapies (if applicable): N/a  Assessment:   Pt is controlled on home simvastatin. No change in therapy necessary at this time.   Plan:    1.Statin intensity (high intensity recommended for all patients regardless of the LDL):  No change in therapy needed at this time  2.Add ezetimibe (if any one of the following):   Not indicated at this time.  3.Refer to lipid clinic:   No  4.Follow-up with:  Primary care provider - Erskine Emery, NP  5.Follow-up labs after discharge:  No changes in lipid therapy, repeat a lipid panel in one year.       Griffin Dakin, PharmD 07/07/2023, 7:30 AM

## 2023-07-07 NOTE — Progress Notes (Signed)
Mobility Specialist Progress Note:   07/07/23 1600  Mobility  Activity Ambulated with assistance in hallway  Level of Assistance Contact guard assist, steadying assist  Assistive Device Front wheel walker  Distance Ambulated (ft) 60 ft  Activity Response Tolerated well  Mobility Referral Yes  $Mobility charge 1 Mobility  Mobility Specialist Start Time (ACUTE ONLY) 1518  Mobility Specialist Stop Time (ACUTE ONLY) 1530  Mobility Specialist Time Calculation (min) (ACUTE ONLY) 12 min    Pre Mobility: 90 HR,  143/57 (81) BP During Mobility: 115 HR Post Mobility:  86 HR  Pt received in bed, agreeable to mobility. Ambulated in hallway w/ RW and contact guard. C/o of being tired after ambulating earlier with PT. Otherwise asymptomatic. Pt left in bed with call bell and family present.  D'Vante Earlene Plater Mobility Specialist Please contact via Special educational needs teacher or Rehab office at [phone]

## 2023-07-07 NOTE — Evaluation (Signed)
Occupational Therapy Evaluation Patient Details Name: LAWANA HARTZELL MRN: [id number] DOB: 05/13/1966 Today's Date: 07/07/2023   History of Present Illness 57 y.o. female s/p Redo exposure of left CFA, endarterectomy left femoral artery, repair left CFA psa, revision of right to left femoral femoral BPG, Prevena vac placement left groin.   Clinical Impression   Patient evaluated by Occupational Therapy with no further acute OT needs identified. All education has been completed and the patient has no further questions. Prior to admit, pt was living at home with husband and adult son, independent with all ADL tasks and functional mobility including driving. Recommended use of shower chair short term when bathing at home if unable to tolerate standing for an extended amount of time.  No  follow-up Occupational Therapy needed. OT is signing off. Thank you for this referral.       Recommendations for follow up therapy are one component of a multi-disciplinary discharge planning process, led by the attending physician.  Recommendations may be updated based on patient status, additional functional criteria and insurance authorization.   Assistance Recommended at Discharge PRN  Patient can return home with the following Assist for transportation;Assistance with cooking/housework;Help with stairs or ramp for entrance    Functional Status Assessment  Patient has had a recent decline in their functional status and demonstrates the ability to make significant improvements in function in a reasonable and predictable amount of time.  Equipment Recommendations  Tub/shower seat (pt reports that she is able to get a shower chair on her own if needed.)       Precautions / Restrictions Precautions Precautions: Fall Restrictions Weight Bearing Restrictions: No      Mobility Bed Mobility Overal bed mobility: Modified Independent    General bed mobility comments: Used left side bed rail to assist. HOB  remained elevated as pt has adjustable bed at home.    Transfers Overall transfer level: Needs assistance Equipment used: Rolling walker (2 wheels) Transfers: Sit to/from Stand, Bed to chair/wheelchair/BSC Sit to Stand: Supervision     Step pivot transfers: Supervision     General transfer comment: Provided VC for hand placement during RW management when completing sit to stand. Pt was able to complete transfer without physical assist.      Balance Overall balance assessment: No apparent balance deficits (not formally assessed)       ADL either performed or assessed with clinical judgement   ADL   General ADL Comments: Patient is able to complete all BADL at Supervision level. Mainly will require set-up/retrieval of items. Increased time needed d/t left side soreness/discomfort.     Vision Baseline Vision/History: 1 Wears glasses Ability to See in Adequate Light: 0 Adequate Patient Visual Report: No change from baseline Vision Assessment?: No apparent visual deficits            Pertinent Vitals/Pain Pain Assessment Pain Assessment: 0-10 Pain Score: 3  Pain Location: left side lower abdomen/surgical incision Pain Descriptors / Indicators: Constant, Sore, Throbbing Pain Intervention(s): Monitored during session, Limited activity within patient's tolerance, Premedicated before session     Hand Dominance Right   Extremity/Trunk Assessment Upper Extremity Assessment Upper Extremity Assessment: Overall WFL for tasks assessed   Lower Extremity Assessment Lower Extremity Assessment: Defer to PT evaluation   Cervical / Trunk Assessment Cervical / Trunk Assessment: Normal   Communication Communication Communication: No difficulties   Cognition Arousal/Alertness: Awake/alert Behavior During Therapy: WFL for tasks assessed/performed Overall Cognitive Status: Within Functional Limits for tasks assessed  General Comments  wound vac remained intact during  session. VSS on RA.            Home Living Family/patient expects to be discharged to:: Private residence Living Arrangements: Spouse/significant other;Children (adult son) Available Help at Discharge: Family;Available 24 hours/day (Son is physically able to assist. Husband is disabled.) Type of Home: House Home Access: Stairs to enter Secretary/administrator of Steps: 2 Entrance Stairs-Rails: None Home Layout: One level     Bathroom Shower/Tub: Chief Strategy Officer: Standard Bathroom Accessibility: Yes How Accessible: Accessible via walker Home Equipment: Rolling Walker (2 wheels);Rollator (4 wheels)          Prior Functioning/Environment Prior Level of Function : Independent/Modified Independent;Driving    Mobility Comments: independent. Sometimes in the winter time, her hip arhritis will kick in and she'll use her RW or rollator ADLs Comments: independent        OT Problem List: Pain;Impaired balance (sitting and/or standing)      OT Treatment/Interventions:   Self care/home management   OT Goals(Current goals can be found in the care plan section) Acute Rehab OT Goals Patient Stated Goal: to go home  OT Frequency:  1X visit       AM-PAC OT "6 Clicks" Daily Activity     Outcome Measure Help from another person eating meals?: None Help from another person taking care of personal grooming?: None Help from another person toileting, which includes using toliet, bedpan, or urinal?: A Little Help from another person bathing (including washing, rinsing, drying)?: A Little Help from another person to put on and taking off regular upper body clothing?: A Little Help from another person to put on and taking off regular lower body clothing?: A Little 6 Click Score: 20   End of Session Equipment Utilized During Treatment: Rolling walker (2 wheels)  Activity Tolerance: Patient tolerated treatment well Patient left: in chair;with call bell/phone within  reach;with chair alarm set  OT Visit Diagnosis: Muscle weakness (generalized) (M62.81)                Time: 2841-3244 OT Time Calculation (min): 28 min Charges:  OT General Charges $OT Visit: 1 Visit OT Evaluation $OT Eval Low Complexity: 1 Low OT Treatments $Self Care/Home Management : 8-22 mins  Limmie Patricia, OTR/L,CBIS  Supplemental OT - [facility] and [facility] Secure Chat Preferred    Kaeo Jacome, Charisse March 07/07/2023, 9:27 AM

## 2023-07-08 LAB — GLUCOSE, CAPILLARY
Glucose-Capillary: 174 mg/dL — ABNORMAL HIGH (ref 70–99)
Glucose-Capillary: 171 mg/dL — ABNORMAL HIGH (ref 70–99)
Glucose-Capillary: 147 mg/dL — ABNORMAL HIGH (ref 70–99)
Glucose-Capillary: 208 mg/dL — ABNORMAL HIGH (ref 70–99)

## 2023-07-08 LAB — POCT ACTIVATED CLOTTING TIME: Activated Clotting Time: 263 seconds

## 2023-07-08 MED ORDER — ENOXAPARIN SODIUM 40 MG/0.4ML IJ SOSY
40.0000 mg | PREFILLED_SYRINGE | INTRAMUSCULAR | Status: DC
  Administered 2023-07-08 – 2023-07-11 (×4): 40 mg via SUBCUTANEOUS
  Filled 2023-07-08 (×4): qty 0.4

## 2023-07-08 MED ORDER — CLOPIDOGREL BISULFATE 75 MG PO TABS
75.0000 mg | ORAL_TABLET | Freq: Every day | ORAL | Status: DC
  Administered 2023-07-09 – 2023-07-12 (×4): 75 mg via ORAL
  Filled 2023-07-08 (×4): qty 1

## 2023-07-08 MED ORDER — METHOCARBAMOL 500 MG PO TABS
500.0000 mg | ORAL_TABLET | Freq: Three times a day (TID) | ORAL | Status: DC | PRN
  Administered 2023-07-08 – 2023-07-12 (×10): 500 mg via ORAL
  Filled 2023-07-08 (×10): qty 1

## 2023-07-08 MED ORDER — SODIUM CHLORIDE 0.9 % IV SOLN: INTRAVENOUS | Status: DC | PRN

## 2023-07-08 NOTE — Progress Notes (Signed)
Mobility Specialist Progress Note:   07/08/23 1600  Mobility  Activity Transferred to/from [facility];Ambulated with assistance in hallway  Level of Assistance Contact guard assist, steadying assist  Assistive Device Front wheel walker  Distance Ambulated (ft) 60 ft  Activity Response Tolerated well  Mobility Referral Yes  $Mobility charge 1 Mobility  Mobility Specialist Start Time (ACUTE ONLY) 1530  Mobility Specialist Stop Time (ACUTE ONLY) 1556  Mobility Specialist Time Calculation (min) (ACUTE ONLY) 26 min    Pre Mobility: 95 HR,  93% SpO2 RA During Mobility: 137 HR,  95% SpO2 RA Post Mobility:  102 HR,  93% SpO2 RA  Pt received in bed, agreeable to mobility. Breathing on RA upon arrival w/ SpO2 levels in high 90s. C/o of pain in groin and lower back. Mod I for bed mobility. Transferred to [facility] prior to ambulation. Void successful. Ambulated in hallway w/ RW and contact guard. Asymptomatic throughout. No c/o other than existing pain. Pt left in bed with call bell and all needs met. RN notified of O2 status.  D'Vante Earlene Plater Mobility Specialist Please contact via Special educational needs teacher or Rehab office at [phone]

## 2023-07-08 NOTE — Progress Notes (Addendum)
Progress Note    07/08/2023 6:40 AM 2 Days Post-Op  Subjective:  says she is sore from laying in bed.  Walked yesterday and was out of bed 3x.  Says she has been up to the [facility] by herself.    Afebrile HR 80's-90's NSR 100's-130's systolic 94% RA  Vitals:   07/07/23 2325 07/08/23 0333  BP: (!) 135/53 (!) 103/52  Pulse:    Resp: 20 17  Temp: 98 F (36.7 C) 98 F (36.7 C)  SpO2: 92% 95%    Physical Exam: General:  sleeping, wakes easily. Cardiac:  regular Lungs:  non labored Incisions:  right groin incision clean and dry; left groin with Prevena vac with good seal; still with some fullness around left groin incision and over fem fem but soft and maybe slightly improved today.  Extremities:  left DP and right AT doppler signal Abdomen:  soft  CBC    Component Value Date/Time   WBC 17.0 (H) 07/07/2023 0420   RBC 3.02 (L) 07/07/2023 0420   HGB 8.7 (L) 07/07/2023 0420   HGB 14.5 08/10/2019 1626   HCT 26.9 (L) 07/07/2023 0420   HCT 43.9 08/10/2019 1626   PLT 235 07/07/2023 0420   PLT 360 08/10/2019 1626   MCV 89.1 07/07/2023 0420   MCV 89 08/10/2019 1626   MCH 28.8 07/07/2023 0420   MCHC 32.3 07/07/2023 0420   RDW 14.9 07/07/2023 0420   RDW 13.5 08/10/2019 1626   LYMPHSABS 3.4 04/09/2023 1155   LYMPHSABS 4.0 (H) 06/09/2018 1516   MONOABS 0.8 04/09/2023 1155   EOSABS 0.2 04/09/2023 1155   EOSABS 0.3 06/09/2018 1516   BASOSABS 0.1 04/09/2023 1155   BASOSABS 0.1 06/09/2018 1516    BMET    Component Value Date/Time   NA 134 (L) 07/07/2023 0420   NA 140 04/11/2021 0916   K 4.1 07/07/2023 0420   CL 100 07/07/2023 0420   CO2 23 07/07/2023 0420   GLUCOSE 183 (H) 07/07/2023 0420   BUN 6 07/07/2023 0420   BUN 11 04/11/2021 0916   CREATININE 0.61 07/07/2023 0420   CREATININE 0.59 01/03/2016 1752   CALCIUM 8.0 (L) 07/07/2023 0420   GFRNONAA >60 07/07/2023 0420   GFRNONAA >89 01/03/2016 1752   GFRAA 118 08/10/2019 1626   GFRAA >89 01/03/2016 1752    INR     Component Value Date/Time   INR 1.0 07/03/2023 1111     Intake/Output Summary (Last 24 hours) at 07/08/2023 0640 Last data filed at 07/08/2023 3664 Gross per 24 hour  Intake 900 ml  Output 1750 ml  Net -850 ml      Assessment/Plan:  57 y.o. female is s/p:  Redo exposure of left CFA, endarterectomy left femoral artery, repair left CFA psa, revision of right to left femoral femoral BPG, Prevena vac placement left groin   2 Days Post-Op   -pt with + doppler flow bilateral feet. -continue to mobilize more today.   -right groin incision looks good; left groin incision with prevena and intact.  No drainage in canister.  -check labs tomorrow -continue ancef for total of 3 days. -DVT prophylaxis:  SCD's currently-probably ok to restart plavix today and possibly sq heparin-will d/w Dr. Chestine Spore.     Doreatha Massed, PA-C [facility] [phone] 07/08/2023 6:40 AM   I have seen and evaluated the patient. I agree with the PA note as documented above.  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  Brisk dorsalis pedis signal in the left foot.  Walk several times yesterday.  Pain ongoing issue.  Will leave Prevena VAC to left groin.  Okay to restart Plavix and DVT prophylaxis.  Continue Ancef prophylaxis.  PT OT mobilize.  Cephus Shelling, MD [facility] [place] Office: [phone]

## 2023-07-09 LAB — GLUCOSE, CAPILLARY
Glucose-Capillary: 212 mg/dL — ABNORMAL HIGH (ref 70–99)
Glucose-Capillary: 163 mg/dL — ABNORMAL HIGH (ref 70–99)
Glucose-Capillary: 130 mg/dL — ABNORMAL HIGH (ref 70–99)
Glucose-Capillary: 158 mg/dL — ABNORMAL HIGH (ref 70–99)

## 2023-07-09 LAB — BASIC METABOLIC PANEL
Anion gap: 7 (ref 5–15)
BUN: 5 mg/dL — ABNORMAL LOW (ref 6–20)
CO2: 24 mmol/L (ref 22–32)
Calcium: 8 mg/dL — ABNORMAL LOW (ref 8.9–10.3)
Chloride: 99 mmol/L (ref 98–111)
Creatinine, Ser: 0.52 mg/dL (ref 0.44–1.00)
GFR, Estimated: >60 mL/min (ref >60)
Glucose, Bld: 155 mg/dL — ABNORMAL HIGH (ref 70–99)
Potassium: 3.3 mmol/L — ABNORMAL LOW (ref 3.5–5.1)
Sodium: 130 mmol/L — ABNORMAL LOW (ref 135–145)

## 2023-07-09 LAB — CBC
HCT: 24.8 % — ABNORMAL LOW (ref 36.0–46.0)
Hemoglobin: 8.1 g/dL — ABNORMAL LOW (ref 12.0–15.0)
MCH: 29.2 pg (ref 26.0–34.0)
MCHC: 32.7 g/dL (ref 30.0–36.0)
MCV: 89.5 fL (ref 80.0–100.0)
Platelets: 246 10*3/uL (ref 150–400)
RBC: 2.77 MIL/uL — ABNORMAL LOW (ref 3.87–5.11)
RDW: 15 % (ref 11.5–15.5)
WBC: 12.3 10*3/uL — ABNORMAL HIGH (ref 4.0–10.5)
nRBC: 0 % (ref 0.0–0.2)

## 2023-07-09 MED ORDER — POTASSIUM CHLORIDE CRYS ER 20 MEQ PO TBCR
30.0000 meq | EXTENDED_RELEASE_TABLET | Freq: Once | ORAL | Status: AC
  Administered 2023-07-09: 30 meq via ORAL
  Filled 2023-07-09: qty 1

## 2023-07-09 NOTE — TOC CM/SW Note (Signed)
Transition of Care [facility]) - Inpatient Brief Assessment   Patient Details  Name: Andrea [name] MRN: [id number] Date of Birth: 1966/09/22  Transition of Care [facility]) CM/SW Contact:    Darrold Span, RN Phone Number: 07/09/2023, 3:22 PM   Clinical Narrative: Pt from home, CM Notified by [facility] liaison -following patient with MD office protocol referral prearranged for [facility] needs. Pt has Prevena wound VAC. We will continue to monitor patient advancement through interdisciplinary progression rounds. If new patient transition needs arise, please place a TOC consult.   Transition of Care Asessment: Insurance and Status: Insurance coverage has been reviewed Patient has primary care physician: Yes Home environment has been reviewed: home Prior level of function:: self, uses RW Prior/Current Home Services: No current home services Social Determinants of Health Reivew: SDOH reviewed no interventions necessary Readmission risk has been reviewed: Yes Transition of care needs: transition of care needs identified, TOC will continue to follow

## 2023-07-09 NOTE — Progress Notes (Signed)
Physical Therapy Treatment Patient Details Name: Andrea [name] MRN: [id number] DOB: February 10, 1966 Today's Date: 07/09/2023   History of Present Illness 57 y.o. female admitted 07/06/23 for Redo exposure of left CFA, endarterectomy left femoral artery, repair left CFA psa, revision of right to left femoral femoral BPG, Prevena vac placement left groin.PMH positive for CVA, DM, HTN, HLD, PVD, CEA, previous LE byposs graft and revision.    PT Comments  Pt received in supine and agreeable to session. Pt able to perform all mobility with up to min guard for safety and assist with line management, but no physical assist needed. Pt able to tolerate increased gait distance this session. Pt demonstrates short step lengths due to groin pain, but is able to increase with cues. Pt continues to benefit from PT services to progress toward functional mobility goals.     Assistance Recommended at Discharge Intermittent Supervision/Assistance  If plan is discharge home, recommend the following:  Can travel by private vehicle    Help with stairs or ramp for entrance;Assist for transportation;Assistance with cooking/housework      Equipment Recommendations  None recommended by PT    Recommendations for Other Services       Precautions / Restrictions Precautions Precautions: Fall Restrictions Weight Bearing Restrictions: No     Mobility  Bed Mobility Overal bed mobility: Needs Assistance Bed Mobility: Supine to Sit, Sit to Supine     Supine to sit: Min guard, HOB elevated Sit to supine: Supervision   General bed mobility comments: increased time and use of bedrail    Transfers Overall transfer level: Needs assistance Equipment used: Rolling walker (2 wheels) Transfers: Sit to/from Stand Sit to Stand: Supervision           General transfer comment: assist for line management    Ambulation/Gait Ambulation/Gait assistance: Supervision Gait Distance (Feet): 140 Feet Assistive  device: Rolling walker (2 wheels) Gait Pattern/deviations: Step-through pattern, Decreased stride length       General Gait Details: Slow steady gait with cues for shoulder depression and increased step length      Balance Overall balance assessment: No apparent balance deficits (not formally assessed)                                          Cognition Arousal/Alertness: Awake/alert Behavior During Therapy: WFL for tasks assessed/performed Overall Cognitive Status: Within Functional Limits for tasks assessed                                          Exercises      General Comments General comments (skin integrity, edema, etc.): HR up to 130's with mobility      Pertinent Vitals/Pain Pain Assessment Pain Assessment: 0-10 Pain Score: 5  Pain Location: L groin Pain Descriptors / Indicators: Sore, Grimacing, Guarding Pain Intervention(s): Monitored during session, Repositioned     PT Goals (current goals can now be found in the care plan section) Acute Rehab PT Goals Patient Stated Goal: to return to independent PT Goal Formulation: With patient Time For Goal Achievement: 07/14/23 Potential to Achieve Goals: Good Progress towards PT goals: Progressing toward goals    Frequency    Min 1X/week      PT Plan Current plan remains appropriate    AM-PAC PT "6 Clicks"  Mobility   Outcome Measure  Help needed turning from your back to your side while in a flat bed without using bedrails?: A Little Help needed moving from lying on your back to sitting on the side of a flat bed without using bedrails?: A Little Help needed moving to and from a bed to a chair (including a wheelchair)?: A Little Help needed standing up from a chair using your arms (e.g., wheelchair or bedside chair)?: A Little Help needed to walk in hospital room?: A Little Help needed climbing 3-5 steps with a railing? : A Lot 6 Click Score: 17    End of Session  Equipment Utilized During Treatment: Gait belt Activity Tolerance: Patient tolerated treatment well Patient left: in bed;with call bell/phone within reach Nurse Communication: Mobility status PT Visit Diagnosis: Difficulty in walking, not elsewhere classified (R26.2);Pain Pain - Right/Left: Left Pain - part of body: Hip     Time: 1610-9604 PT Time Calculation (min) (ACUTE ONLY): 18 min  Charges:    $Gait Training: 8-22 mins PT General Charges $$ ACUTE PT VISIT: 1 Visit                     Johny Shock, PTA Acute Rehabilitation Services Secure Chat Preferred  [phone]    Johny Shock 07/09/2023, 2:15 PM

## 2023-07-09 NOTE — Progress Notes (Addendum)
Progress Note    07/09/2023 6:42 AM 3 Days Post-Op  Subjective:  says the muscle relaxer helped her back spasms.  She walked a little.  Says she has some soreness in the left thigh.  Afebrile HR 80's-100's 100's-130's systolic   Vitals:   07/08/23 2202 07/09/23 0308  BP: (!) 103/41 (!) 139/54  Pulse:    Resp: 14 18  Temp:  97.9 F (36.6 C)  SpO2: 94% 95%    Physical Exam: General:  no distress Cardiac:  regular Lungs:  non labored Incisions:  left groin with prevena and right groin incision is clean.  Still fullness around area but soft and appears unchanged.  Left calf and thigh are soft and non tender to palpation.  Extremities:  palpable left DP; monophasic AT and faint peroneal doppler signals on the right.   Abdomen:  soft  CBC    Component Value Date/Time   WBC 12.3 (H) 07/09/2023 0320   RBC 2.77 (L) 07/09/2023 0320   HGB 8.1 (L) 07/09/2023 0320   HGB 14.5 08/10/2019 1626   HCT 24.8 (L) 07/09/2023 0320   HCT 43.9 08/10/2019 1626   PLT 246 07/09/2023 0320   PLT 360 08/10/2019 1626   MCV 89.5 07/09/2023 0320   MCV 89 08/10/2019 1626   MCH 29.2 07/09/2023 0320   MCHC 32.7 07/09/2023 0320   RDW 15.0 07/09/2023 0320   RDW 13.5 08/10/2019 1626   LYMPHSABS 3.4 04/09/2023 1155   LYMPHSABS 4.0 (H) 06/09/2018 1516   MONOABS 0.8 04/09/2023 1155   EOSABS 0.2 04/09/2023 1155   EOSABS 0.3 06/09/2018 1516   BASOSABS 0.1 04/09/2023 1155   BASOSABS 0.1 06/09/2018 1516    BMET    Component Value Date/Time   NA 130 (L) 07/09/2023 0320   NA 140 04/11/2021 0916   K 3.3 (L) 07/09/2023 0320   CL 99 07/09/2023 0320   CO2 24 07/09/2023 0320   GLUCOSE 155 (H) 07/09/2023 0320   BUN 5 (L) 07/09/2023 0320   BUN 11 04/11/2021 0916   CREATININE 0.52 07/09/2023 0320   CREATININE 0.59 01/03/2016 1752   CALCIUM 8.0 (L) 07/09/2023 0320   GFRNONAA >60 07/09/2023 0320   GFRNONAA >89 01/03/2016 1752   GFRAA 118 08/10/2019 1626   GFRAA >89 01/03/2016 1752    INR     Component Value Date/Time   INR 1.0 07/03/2023 1111     Intake/Output Summary (Last 24 hours) at 07/09/2023 5427 Last data filed at 07/09/2023 0554 Gross per 24 hour  Intake 618.67 ml  Output 1400 ml  Net -781.33 ml      Assessment/Plan:  57 y.o. female is s/p:  Redo exposure of left CFA, endarterectomy left femoral artery, repair left CFA psa, revision of right to left femoral femoral BPG, Prevena vac placement left groin   3 Days Post-Op   -pt has palpable left DP pulse.  Right monophasic AT and peroneal.  Calf and thigh of left leg are soft and non tender.   -continue to increase mobilization -Robaxin ordered for back spasms and this did help relieve her sx.  Discussed we probably won't send her home with this when she is discharged. -today is last day of Ancef.   -PT/OT - no follow up recommended -DVT prophylaxis:  sq heparin.  Hgb down to 8.1 from 8.7-continue to hold plavix for now.     Doreatha Massed, PA-C [facility] [phone] 07/09/2023 6:42 AM  I have seen and evaluated the patient. I  agree with the PA note as documented above.  Postop day 3 status post repair of left common femoral pseudoaneurysm and revision of right to left femoral-femoral bypass graft.  Left incisional VAC looks good.  Brisk Doppler signal in the femorofemoral graft.  Left DP palpable.  Pain and mobility continue to be an issue.  Continue to work on mobilization.  Will finish Ancef prophylaxis today.  DVT prophylaxis ordered with Lovenox.  Plavix restarted.  Hopefully home in the next 1 or 2 days.  Cephus Shelling, MD [facility] [place] Office: [phone]

## 2023-07-09 NOTE — Progress Notes (Signed)
Mobility Specialist Progress Note:   07/09/23 1500  Mobility  Activity Ambulated with assistance in hallway  Level of Assistance Contact guard assist, steadying assist  Assistive Device Front wheel walker  Distance Ambulated (ft) 75 ft  Activity Response Tolerated well  Mobility Referral Yes  $Mobility charge 1 Mobility  Mobility Specialist Start Time (ACUTE ONLY) 1522  Mobility Specialist Stop Time (ACUTE ONLY) 1538  Mobility Specialist Time Calculation (min) (ACUTE ONLY) 16 min    Pre Mobility: 95  HR During Mobility: 131 HR Post Mobility:  92 HR  Pt received in bed, agreeable to mobility. Ambulated in hallway w/ RW and contact guard. C/o of fatigue and persistent low back pain. Otherwise asymptomatic. Pt left in bed with call bell and family present.  [place] Earlene Plater Mobility Specialist Please contact via Special educational needs teacher or Rehab office at [phone]

## 2023-07-09 NOTE — Care Management Important Message (Signed)
Important Message  Patient Details  Name: Andrea [name] MRN: [id number] Date of Birth: Aug 22, 1966   Medicare Important Message Given:  Yes     Renie Ora 07/09/2023, 8:19 AM

## 2023-07-10 ENCOUNTER — Encounter (HOSPITAL_COMMUNITY): Payer: Self-pay | Admitting: Vascular Surgery

## 2023-07-10 LAB — BASIC METABOLIC PANEL
Anion gap: 7 (ref 5–15)
BUN: 7 mg/dL (ref 6–20)
CO2: 26 mmol/L (ref 22–32)
Calcium: 8.1 mg/dL — ABNORMAL LOW (ref 8.9–10.3)
Chloride: 100 mmol/L (ref 98–111)
Creatinine, Ser: 0.45 mg/dL (ref 0.44–1.00)
GFR, Estimated: >60 mL/min (ref >60)
Glucose, Bld: 151 mg/dL — ABNORMAL HIGH (ref 70–99)
Potassium: 3.6 mmol/L (ref 3.5–5.1)
Sodium: 133 mmol/L — ABNORMAL LOW (ref 135–145)

## 2023-07-10 LAB — GLUCOSE, CAPILLARY
Glucose-Capillary: 113 mg/dL — ABNORMAL HIGH (ref 70–99)
Glucose-Capillary: 144 mg/dL — ABNORMAL HIGH (ref 70–99)
Glucose-Capillary: 133 mg/dL — ABNORMAL HIGH (ref 70–99)
Glucose-Capillary: 159 mg/dL — ABNORMAL HIGH (ref 70–99)

## 2023-07-10 NOTE — Progress Notes (Signed)
Mobility Specialist Progress Note:   07/10/23 1400  Mobility  Activity Ambulated with assistance in hallway  Level of Assistance Contact guard assist, steadying assist  Assistive Device Front wheel walker  Distance Ambulated (ft) 200 ft  Activity Response Tolerated well  Mobility Referral Yes  $Mobility charge 1 Mobility  Mobility Specialist Start Time (ACUTE ONLY) 1411  Mobility Specialist Stop Time (ACUTE ONLY) 1435  Mobility Specialist Time Calculation (min) (ACUTE ONLY) 24 min    Pre Mobility: 84 HR,  98/54 (67) BP During Mobility: 128 HR Post Mobility:  100 HR  Pt received in bed, agreeable to mobility. Ambulated in hallway w/ RW and contact guard. C/o of fatigue w/ increased distance. Took x2 standing rest breaks. Asymptomatic throughout. Pt left in bed with call bell and all needs met.  [place] Earlene Plater Mobility Specialist Please contact via Special educational needs teacher or Rehab office at [phone]

## 2023-07-10 NOTE — Progress Notes (Addendum)
  Progress Note    07/10/2023 6:42 AM 4 Days Post-Op  Subjective:  says she is feeling ok right now.  Walked a couple times yesterday.  Afebrile HR 80's 100's systolic 95% RA  Vitals:   07/09/23 1946 07/10/23 0400  BP: 102/69 104/61  Pulse:    Resp: 20 20  Temp: 98.5 F (36.9 C) 98.8 F (37.1 C)  SpO2: 95% 95%    Physical Exam: General:  sitting in bed in no distress Cardiac:  regular Lungs:  non labored Incisions:  left groin with Prevena vac in tact; right groin remains clean and dry.  Area over graft still full but remains soft and unchanged.  Extremities:  bilateral feet are warm and well perfused. Abdomen:  soft.  CBC    Component Value Date/Time   WBC 12.3 (H) 07/09/2023 0320   RBC 2.77 (L) 07/09/2023 0320   HGB 8.1 (L) 07/09/2023 0320   HGB 14.5 08/10/2019 1626   HCT 24.8 (L) 07/09/2023 0320   HCT 43.9 08/10/2019 1626   PLT 246 07/09/2023 0320   PLT 360 08/10/2019 1626   MCV 89.5 07/09/2023 0320   MCV 89 08/10/2019 1626   MCH 29.2 07/09/2023 0320   MCHC 32.7 07/09/2023 0320   RDW 15.0 07/09/2023 0320   RDW 13.5 08/10/2019 1626   LYMPHSABS 3.4 04/09/2023 1155   LYMPHSABS 4.0 (H) 06/09/2018 1516   MONOABS 0.8 04/09/2023 1155   EOSABS 0.2 04/09/2023 1155   EOSABS 0.3 06/09/2018 1516   BASOSABS 0.1 04/09/2023 1155   BASOSABS 0.1 06/09/2018 1516    BMET    Component Value Date/Time   NA 130 (L) 07/09/2023 0320   NA 140 04/11/2021 0916   K 3.3 (L) 07/09/2023 0320   CL 99 07/09/2023 0320   CO2 24 07/09/2023 0320   GLUCOSE 155 (H) 07/09/2023 0320   BUN 5 (L) 07/09/2023 0320   BUN 11 04/11/2021 0916   CREATININE 0.52 07/09/2023 0320   CREATININE 0.59 01/03/2016 1752   CALCIUM 8.0 (L) 07/09/2023 0320   GFRNONAA >60 07/09/2023 0320   GFRNONAA >89 01/03/2016 1752   GFRAA 118 08/10/2019 1626   GFRAA >89 01/03/2016 1752    INR    Component Value Date/Time   INR 1.0 07/03/2023 1111     Intake/Output Summary (Last 24 hours) at 07/10/2023  8841 Last data filed at 07/09/2023 2000 Gross per 24 hour  Intake 968.24 ml  Output 1800 ml  Net -831.76 ml      Assessment/Plan:  57 y.o. female is s/p:  Redo exposure of left CFA, endarterectomy left femoral artery, repair left CFA psa, revision of right to left femoral femoral BPG, Prevena vac placement left groin    4 Days Post-Op   -pt doing well this am.  Needs to continue to mobilize.  No follow up PT or OT recommended.   -prophylactic Ancef finished yesterday -hopeful for discharge home in the next day or two.   -DVT prophylaxis:  Lovenox -plavix has been restarted.    Doreatha Massed, PA-C [facility] [phone] 07/10/2023 6:42 AM  I have seen and evaluated the patient. I agree with the PA note as documented above. Overall looks good after left femoral pseudoaneurysm repair and revision of fem fem on Monday.  Left DP palpable.  Continue mobility and pain control.  Hopefully discharge tomorrow.  Cephus Shelling, MD [facility] [place] Office: [phone]

## 2023-07-11 LAB — GLUCOSE, CAPILLARY
Glucose-Capillary: 116 mg/dL — ABNORMAL HIGH (ref 70–99)
Glucose-Capillary: 139 mg/dL — ABNORMAL HIGH (ref 70–99)
Glucose-Capillary: 120 mg/dL — ABNORMAL HIGH (ref 70–99)
Glucose-Capillary: 157 mg/dL — ABNORMAL HIGH (ref 70–99)

## 2023-07-11 NOTE — Progress Notes (Addendum)
Progress Note    07/11/2023 6:54 AM 5 Days Post-Op  Subjective:  still having pain in her left groin.  Not taking IV pain meds.  Has not done the stairs yet.   Afebrile HR 70's-90's NSR 100's-120's systolic 100% RA  Vitals:   07/10/23 2300 07/11/23 0304  BP: (!) 121/47 (!) 105/40  Pulse:  87  Resp:    Temp:  98.2 F (36.8 C)  SpO2: 96% 93%    Physical Exam: General:  no distress Cardiac:  regular Lungs:  non labored Incisions:  left groin with prevena with good seal; right groin incision healing nicely Extremities:  brisk left DP doppler signal Abdomen:  soft  CBC    Component Value Date/Time   WBC 12.3 (H) 07/09/2023 0320   RBC 2.77 (L) 07/09/2023 0320   HGB 8.1 (L) 07/09/2023 0320   HGB 14.5 08/10/2019 1626   HCT 24.8 (L) 07/09/2023 0320   HCT 43.9 08/10/2019 1626   PLT 246 07/09/2023 0320   PLT 360 08/10/2019 1626   MCV 89.5 07/09/2023 0320   MCV 89 08/10/2019 1626   MCH 29.2 07/09/2023 0320   MCHC 32.7 07/09/2023 0320   RDW 15.0 07/09/2023 0320   RDW 13.5 08/10/2019 1626   LYMPHSABS 3.4 04/09/2023 1155   LYMPHSABS 4.0 (H) 06/09/2018 1516   MONOABS 0.8 04/09/2023 1155   EOSABS 0.2 04/09/2023 1155   EOSABS 0.3 06/09/2018 1516   BASOSABS 0.1 04/09/2023 1155   BASOSABS 0.1 06/09/2018 1516    BMET    Component Value Date/Time   NA 133 (L) 07/10/2023 0910   NA 140 04/11/2021 0916   K 3.6 07/10/2023 0910   CL 100 07/10/2023 0910   CO2 26 07/10/2023 0910   GLUCOSE 151 (H) 07/10/2023 0910   BUN 7 07/10/2023 0910   BUN 11 04/11/2021 0916   CREATININE 0.45 07/10/2023 0910   CREATININE 0.59 01/03/2016 1752   CALCIUM 8.1 (L) 07/10/2023 0910   GFRNONAA >60 07/10/2023 0910   GFRNONAA >89 01/03/2016 1752   GFRAA 118 08/10/2019 1626   GFRAA >89 01/03/2016 1752    INR    Component Value Date/Time   INR 1.0 07/03/2023 1111     Intake/Output Summary (Last 24 hours) at 07/11/2023 0654 Last data filed at 07/10/2023 2000 Gross per 24 hour  Intake  480 ml  Output --  Net 480 ml      Assessment/Plan:  57 y.o. female is s/p:  Redo exposure of left CFA, endarterectomy left femoral artery, repair left CFA psa, revision of right to left femoral femoral BPG, Prevena vac placement left groin    5 Days Post-Op   -prevena with good seal-will remove this prior to discharge.  Right groin is healing nicely.  Brisk doppler flow left foot.  -needs PT/mobility to work with her for stairs.   -most likely home tomorrow if pain is better controlled.  -DVT prophylaxis:  Lovenox -continue asa/plavix.  Not on statin due to allergy.   Doreatha Massed, PA-C [facility] [phone] 07/11/2023 6:54 AM   I have seen and evaluated the patient. I agree with the PA note as documented above.  Postop day 5 status post repair of left femoral pseudoaneurysm with revision of femorofemoral bypass.  She has brisk left dorsalis pedis signal.  Good signal in the femorofemoral bypass.  Incisional VAC to left groin.  Overall looks good.  Pain continues to be an issue with mobility and she is worried about steps at home.  Will ask mobility team to walker today.  Hopefully home tomorrow.  Home meds restarted including Plavix.  Cephus Shelling, MD [facility] [place] Office: [phone]

## 2023-07-11 NOTE — Progress Notes (Signed)
Mobility Specialist Progress Note    07/11/23 1006  Mobility  Activity Ambulated with assistance in hallway  Level of Assistance Minimal assist, patient does 75% or more  Assistive Device Front wheel walker;Four wheel walker  Distance Ambulated (ft) 280 ft (140+140)  Activity Response Tolerated well  Mobility Referral Yes  $Mobility charge 1 Mobility  Mobility Specialist Start Time (ACUTE ONLY) 0935  Mobility Specialist Stop Time (ACUTE ONLY) 1003  Mobility Specialist Time Calculation (min) (ACUTE ONLY) 28 min   Pre-Mobility: 84 HR, 100% SpO2 During Mobility: 121 HR, SpO2 Post-Mobility: 103 HR  Pt received sitting EOB and agreeable. Had void in bathroom. C/o 6/10 leg pain and fatigue. Pt took x1 extended seated rest break. Switched AD to four wheel walker d/t fatigue. Returned to bed with call bell in reach and family present.   [place] Mobility Specialist  Please Neurosurgeon or Rehab Office at [phone]

## 2023-07-12 LAB — GLUCOSE, CAPILLARY: Glucose-Capillary: 125 mg/dL — ABNORMAL HIGH (ref 70–99)

## 2023-07-12 MED ORDER — OXYCODONE-ACETAMINOPHEN 5-325 MG PO TABS: 1.0000 | ORAL_TABLET | Freq: Four times a day (QID) | ORAL | 0 refills | Status: DC | PRN

## 2023-07-12 NOTE — Discharge Summary (Signed)
Discharge Summary     Andrea [name] May 09, 1966 57 y.o. 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 female , with history of multiple medical issues including DM, HTN, HLD, tobacco abuse that presents for follow-up after CTA for evaluation of suspected left femoral pseudoaneurysm in setting of femorofemoral bypass.  Patient was seen several weeks ago for surveillance.  She states about 3 months ago she had noticed a mass in her left groin that has been growing.   Patient initially underwent right to left femorofemoral bypass by Dr. Madilyn Fireman in 2003 using 7 mm Dacron.  She had to have a revision later with bilateral femoral patch angioplasty in 2005. She has also undergone a right common iliac artery stent here by Dr. Madilyn Fireman and another iliac stent at [facility].      She was previously seen for follow-up with recurrent bilateral lower extremity claudication.  I sent her for CTA to evaluate for aortic disease versus iliac disease in stents versus an issue with her femorofemoral bypass.  Ultimately we elected to delay intervention and continue surveillance.  Hospital Course:  The patient was admitted to the hospital and taken to the operating room on 07/06/2023 and underwent: 1.  Redo exposure of left common femoral artery greater than 30 days 2.  Repair of left common femoral pseudoaneurysm 3.  Revision of right to left femoral-femoral bypass with 8 mm Dacron interposition and new left common femoral anastomosis 4.  Left femoral endarterectomy with endarterectomy of the proximal SFA 5.  Application of skin substitute left groin wound (500 mg myriad morcels) 6.  Left groin incisional wound vac    Findings:  Large left femoral pseudoaneurysm over 8 cm.  Initially started in the  left groin and we could not get proximal control  of the femoral femoral graft given this was degenerated over toward the pubis.  Ultimately made a counterincision over the femorofemoral graft on the right side of the abdominal wall to get proximal control.  The SFA and profunda were dissected out.  We entered the pseudoaneurysm and this was completely evacuated.  The Dacron was completely dehisced from the left common femoral artery.  The left common femoral required endarterectomy including the 2 main profunda branches and proximal SFA.  A new 8 mm dacroon graft was sewn end to end from the counterincision to the old femoral femoral graft and tunneled through a new skin tunnel and then sewn to the left common femoral artery.  Brisk profunda and SFA signals at completion.  Ultimately after hemostasis was obtained I did repair some fascia on the abdominal wall with #1 PDS.  We placed myriad morcels in the wound.  This was closed in multiple layers.  An incisional VAC was applied to the left groin.   The pt tolerated the procedure well and was transported to the PACU in good condition.   She maintained good doppler flow throughout her hospitalization.  Pain control was an issue and this was controlled by time of discharge.  Her plavix was restarted post operatively. She maintained the Prevena vac until day of discharge when this was removed and both incisions were healing nicely.  She was maintained on ancef x 3 days for prophylaxis given synthetic material present.  She worked with OT/PT.  She was discharged home  on POD 6.     CBC    Component Value Date/Time   WBC 12.3 (H) 07/09/2023 0320   RBC 2.77 (L) 07/09/2023 0320   HGB 8.1 (L) 07/09/2023 0320   HGB 14.5 08/10/2019 1626   HCT 24.8 (L) 07/09/2023 0320   HCT 43.9 08/10/2019 1626   PLT 246 07/09/2023 0320   PLT 360 08/10/2019 1626   MCV 89.5 07/09/2023 0320   MCV 89 08/10/2019 1626   MCH 29.2 07/09/2023 0320   MCHC 32.7 07/09/2023 0320   RDW  15.0 07/09/2023 0320   RDW 13.5 08/10/2019 1626   LYMPHSABS 3.4 04/09/2023 1155   LYMPHSABS 4.0 (H) 06/09/2018 1516   MONOABS 0.8 04/09/2023 1155   EOSABS 0.2 04/09/2023 1155   EOSABS 0.3 06/09/2018 1516   BASOSABS 0.1 04/09/2023 1155   BASOSABS 0.1 06/09/2018 1516    BMET    Component Value Date/Time   NA 133 (L) 07/10/2023 0910   NA 140 04/11/2021 0916   K 3.6 07/10/2023 0910   CL 100 07/10/2023 0910   CO2 26 07/10/2023 0910   GLUCOSE 151 (H) 07/10/2023 0910   BUN 7 07/10/2023 0910   BUN 11 04/11/2021 0916   CREATININE 0.45 07/10/2023 0910   CREATININE 0.59 01/03/2016 1752   CALCIUM 8.1 (L) 07/10/2023 0910   GFRNONAA >60 07/10/2023 0910   GFRNONAA >89 01/03/2016 1752   GFRAA 118 08/10/2019 1626   GFRAA >89 01/03/2016 1752     Discharge Instructions     Discharge patient   Complete by: As directed    Discharge disposition: 01-Home or Self Care   Discharge patient date: 07/12/2023       Discharge Diagnosis:  PAD (peripheral artery disease) (HCC) [I73.9] 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]  Secondary Diagnosis: Patient Active Problem List   Diagnosis Date Noted   PAD (peripheral artery disease) (HCC) 07/06/2023   Pseudoaneurysm of left femoral artery (HCC) 07/06/2023   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   Pseudoaneurysm of femoral artery (HCC) 06/16/2023   Allergy 04/29/2022   Anemia 04/29/2022   Anxiety 04/29/2022   Arthritis 04/29/2022   Cancer (HCC) 04/29/2022   Carotid artery occlusion 04/29/2022   Depression 04/29/2022   Diabetes mellitus with circulatory complication (HCC) 04/29/2022   Diastolic dysfunction without heart failure 04/29/2022   Heart murmur 04/29/2022   History of kidney stones 04/29/2022   Hyperlipidemia 04/29/2022   Hypertension 04/29/2022   Mild aortic stenosis 04/29/2022   Shingles 06/07/2016   Bilateral carotid artery stenosis 10/18/2014   TIA (transient ischemic attack) 08/29/2014   HLD  (hyperlipidemia) 08/29/2014   DM (diabetes mellitus) with complications (HCC) 08/29/2014   HTN (hypertension), benign 08/29/2014   Stroke (HCC) 06/22/2014   CVA (cerebral infarction) 06/22/2014   PVD (peripheral vascular disease) (HCC) 04/12/2014   Occlusion and stenosis of carotid artery without mention of cerebral infarction 04/12/2014   Past Medical History:  Diagnosis Date   Allergy    Anemia    Anxiety    on Alprazolam   Arthritis    both hips   Asthma    Bilateral carotid artery stenosis 10/18/2014   Cancer (HCC)    skin cancer   Carotid artery occlusion    a. s/p prior endarterectomies with known RICA occlusion - followed by [facility].   COPD (chronic obstructive pulmonary disease) (HCC)    CVA (cerebral infarction) 06/22/2014   Depression    on Effexor   Diabetes mellitus with circulatory complication (HCC)  2011   Diastolic dysfunction without heart failure    DM (diabetes mellitus) with complications (HCC) 08/29/2014   GERD (gastroesophageal reflux disease)    Heart murmur    was seen by cardiologist in [facility]   History of kidney stones    HLD (hyperlipidemia) 08/29/2014   HTN (hypertension), benign 08/29/2014   Hyperlipidemia    Hypertension    Occlusion and stenosis of carotid artery without mention of cerebral infarction 04/12/2014   PVD (peripheral vascular disease) (HCC)    a. s/p prior LE bypass grafting and revision (followed by [facility]).   Shingles 06/07/2016   Stroke (HCC) 06/22/2014   TIA   TIA (transient ischemic attack)    a. possible mini stroke in 2015 - was told she did not have full stroke.     Allergies as of 07/12/2023       Reactions   Atorvastatin    Muscle cramps   Iohexol Hives   Patient should take 13 hours prep    Other Hives   Contrast dye   Rosuvastatin Nausea And Vomiting        Medication List     STOP taking these medications    diphenhydrAMINE 50 MG capsule Commonly known as: BENADRYL   predniSONE 50 MG  tablet Commonly known as: DELTASONE       TAKE these medications    amLODipine 10 MG tablet Commonly known as: NORVASC Take 1 tablet (10 mg total) by mouth daily.   aspirin 81 MG tablet Take 1 tablet (81 mg total) by mouth daily.   cetirizine 10 MG tablet Commonly known as: ZYRTEC Take 10 mg by mouth daily as needed for allergies.   clopidogrel 75 MG tablet Commonly known as: PLAVIX Take 1 tablet (75 mg total) by mouth daily.   fluconazole 100 MG tablet Commonly known as: Diflucan Take 1 tablet (100 mg total) by mouth daily.   fluticasone 50 MCG/ACT nasal spray Commonly known as: FLONASE Place 2 sprays into both nostrils as needed for allergies or rhinitis.   Fluticasone-Umeclidin-Vilant 200-62.5-25 MCG/ACT Aepb Inhale 1 puff into the lungs daily.   Lancets Misc Test blood sugar once daily.   lisinopril 2.5 MG tablet Commonly known as: ZESTRIL Take 1 tablet (2.5 mg total) by mouth daily.   metFORMIN 1000 MG tablet Commonly known as: GLUCOPHAGE Take 1 tablet (1,000 mg total) by mouth 2 (two) times daily with a meal.   omeprazole 40 MG capsule Commonly known as: PRILOSEC Take 1 capsule (40 mg total) by mouth daily.   oxyCODONE-acetaminophen 5-325 MG tablet Commonly known as: Percocet Take 1 tablet by mouth every 6 (six) hours as needed.   Repatha SureClick 140 MG/ML Soaj Generic drug: Evolocumab Inject 140 mg into the skin every 14 (fourteen) days.   simvastatin 20 MG tablet Commonly known as: ZOCOR Take 1 tablet (20 mg total) by mouth at bedtime.   venlafaxine XR 150 MG 24 hr capsule Commonly known as: EFFEXOR-XR Take 150 mg by mouth daily with breakfast.        Discharge Instructions: [facility] Discharge instructions Lower Extremity Bypass Surgery  Please refer to the following instruction for your post-procedure care. Your surgeon or physician assistant will discuss any changes with you.  Activity  You are  encouraged to walk as much as you can. You can slowly return to normal activities during the month after your surgery. Avoid strenuous activity and heavy lifting until your doctor tells you it's  OK. Avoid activities such as vacuuming or swinging a golf club. Do not drive until your doctor give the OK and you are no longer taking prescription pain medications. It is also normal to have difficulty with sleep habits, eating and bowel movement after surgery. These will go away with time.  Bathing/Showering  You may shower after you go home. Do not soak in a bathtub, hot tub, or swim until the incision heals completely.  Incision Care  Clean your incision with mild soap and water. Shower every day. Pat the area dry with a clean towel. You do not need a bandage unless otherwise instructed. Do not apply any ointments or creams to your incision. If you have open wounds you will be instructed how to care for them or a visiting nurse may be arranged for you. If you have staples or sutures along your incision they will be removed at your post-op appointment. You may have skin glue on your incision. Do not peel it off. It will come off on its own in about one week.  Wash the groin wound with soap and water daily and pat dry. (No tub bath-only shower)  Then put a dry gauze or washcloth in the groin to keep this area dry to help prevent wound infection.  Do this daily and as needed.  Do not use Vaseline or neosporin on your incisions.  Only use soap and water on your incisions and then protect and keep dry.  Diet  Resume your normal diet. There are no special food restrictions following this procedure. A low fat/ low cholesterol diet is recommended for all patients with vascular disease. In order to heal from your surgery, it is CRITICAL to get adequate nutrition. Your body requires vitamins, minerals, and protein. Vegetables are the best source of vitamins and minerals. Vegetables also provide the perfect balance  of protein. Processed food has little nutritional value, so try to avoid this.  Medications  Resume taking all your medications unless your doctor or Physician Assistant tells you not to. If your incision is causing pain, you may take over-the-counter pain relievers such as acetaminophen (Tylenol). If you were prescribed a stronger pain medication, please aware these medication can cause nausea and constipation. Prevent nausea by taking the medication with a snack or meal. Avoid constipation by drinking plenty of fluids and eating foods with high amount of fiber, such as fruits, vegetables, and grains. Take Colace 100 mg (an over-the-counter stool softener) twice a day as needed for constipation.  Do not take Tylenol if you are taking prescription pain medications.  Follow Up  Our office will schedule a follow up appointment 2-3 weeks following discharge.  Please call us immediately for any of the following conditions  Severe or worsening pain in your legs or feet while at rest or while walking Increase pain, redness, warmth, or drainage (pus) from your incision site(s) Fever of 101 degree or higher The swelling in your leg with the bypass suddenly worsens and becomes more painful than when you were in the hospital If you have been instructed to feel your graft pulse then you should do so every day. If you can no longer feel this pulse, call the office immediately. Not all patients are given this instruction.  Leg swelling is common after leg bypass surgery.  The swelling should improve over a few months following surgery. To improve the swelling, you may elevate your legs above the level of your heart while you are sitting or resting.  Your surgeon or physician assistant may ask you to apply an ACE wrap or wear compression (TED) stockings to help to reduce swelling.  Reduce your risk of vascular disease  Stop smoking. If you would like help call [facility] at 1-800-QUIT-NOW ((209) 525-0288)  or  at 563-800-0493.  Manage your cholesterol Maintain a desired weight Control your diabetes weight Control your diabetes Keep your blood pressure down  If you have any questions, please call the office at [phone]   Prescriptions given: 1.  Roxicet #30 No Refill  Disposition: home  Patient's condition: is Good  Follow up: 1.Dr. Chestine Spore in 2-3 weeks incision check and staple removal   Doreatha Massed, PA-C [facility] [phone] 07/12/2023  9:40 AM  - For VQI Registry use ---   Post-op:  Wound infection: No  Graft infection: No  Transfusion: No    If yes, n/a units given New Arrhythmia: No Ipsilateral amputation: No, [ ]  Minor, [ ]  BKA, [ ]  AKA Discharge patency: [x ] Primary, [ ]  Primary assisted, [ ]  Secondary, [ ]  Occluded Patency judged by: [x ] Dopper only, [ ]  Palpable graft pulse, []  Palpable distal pulse, [ ]  ABI inc. > 0.15, [ ]  Duplex Discharge ABI: R not done, L  D/C Ambulatory Status: Ambulatory  Complications: MI: No, [ ]  Troponin only, [ ]  EKG or Clinical CHF: No Resp failure:No, [ ]  Pneumonia, [ ]  Ventilator Chg in renal function: No, [ ]  Inc. Cr > 0.5, [ ]  Temp. Dialysis,  [ ]  Permanent dialysis Stroke: No, [ ]  Minor, [ ]  Major Return to OR: No  Reason for return to OR: [ ]  Bleeding, [ ]  Infection, [ ]  Thrombosis, [ ]  Revision  Discharge medications: Statin use:  yes ASA use:  yes Plavix use:  yes Beta blocker use: no CCB use:  Yes ACEI use:   yes ARB use:  no Coumadin use: no

## 2023-07-12 NOTE — Progress Notes (Signed)
Discharge instructions (including medications) discussed with and copy provided to patient/caregiver 

## 2023-07-12 NOTE — TOC Transition Note (Signed)
Transition of Care [facility]) - CM/SW Discharge Note   Patient Details  Name: Andrea [name] MRN: [id number] Date of Birth: 05/06/1966  Transition of Care [facility]) CM/SW Contact:  Ronny Bacon, RN Phone Number: 07/12/2023, 10:14 AM   Clinical Narrative:   Patient is being discharged home today. Confirmed with [facility] that they are following patient after discharge.    Final next level of care: Home w Home Health Services Barriers to Discharge: Continued Medical Work up   Patient Goals and CMS Choice      Discharge Placement                         Discharge Plan and Services Additional resources added to the After Visit Summary for                              [facility] Agency: [facility]        Social Determinants of Health (SDOH) Interventions SDOH Screenings   Food Insecurity: No Food Insecurity (07/06/2023)  Housing: Low Risk  (07/06/2023)  Transportation Needs: No Transportation Needs (07/06/2023)  Utilities: At Risk (07/06/2023)  Depression (PHQ2-9): Low Risk  (10/15/2019)  Physical Activity: Inactive (07/05/2018)  Social Connections: Socially Integrated (07/05/2018)  Stress: Stress Concern Present (07/05/2018)  Tobacco Use: High Risk (07/06/2023)     Readmission Risk Interventions     No data to display

## 2023-07-12 NOTE — Progress Notes (Addendum)
Progress Note    07/12/2023 7:10 AM 6 Days Post-Op  Subjective:  says she doesn't think she is ready to go home.  Did 2 steps up and 2 down yesterday.  Wants to shower  Afebrile HR 100's-110's systolic HR 60'-80's NSR 93% RA  Vitals:   07/12/23 0023 07/12/23 0435  BP: (!) 118/42 (!) 106/46  Pulse:    Resp: 16 18  Temp: 97.9 F (36.6 C) 98.1 F (36.7 C)  SpO2: 95% 93%    Physical Exam: General:  no distress; appears down.  Cardiac:  regular Lungs:  non labored Incisions:  left groin with prevena with good seal; right groin healing nicely Extremities:  brisk DP doppler flow bilaterally Abdomen:  soft  CBC    Component Value Date/Time   WBC 12.3 (H) 07/09/2023 0320   RBC 2.77 (L) 07/09/2023 0320   HGB 8.1 (L) 07/09/2023 0320   HGB 14.5 08/10/2019 1626   HCT 24.8 (L) 07/09/2023 0320   HCT 43.9 08/10/2019 1626   PLT 246 07/09/2023 0320   PLT 360 08/10/2019 1626   MCV 89.5 07/09/2023 0320   MCV 89 08/10/2019 1626   MCH 29.2 07/09/2023 0320   MCHC 32.7 07/09/2023 0320   RDW 15.0 07/09/2023 0320   RDW 13.5 08/10/2019 1626   LYMPHSABS 3.4 04/09/2023 1155   LYMPHSABS 4.0 (H) 06/09/2018 1516   MONOABS 0.8 04/09/2023 1155   EOSABS 0.2 04/09/2023 1155   EOSABS 0.3 06/09/2018 1516   BASOSABS 0.1 04/09/2023 1155   BASOSABS 0.1 06/09/2018 1516    BMET    Component Value Date/Time   NA 133 (L) 07/10/2023 0910   NA 140 04/11/2021 0916   K 3.6 07/10/2023 0910   CL 100 07/10/2023 0910   CO2 26 07/10/2023 0910   GLUCOSE 151 (H) 07/10/2023 0910   BUN 7 07/10/2023 0910   BUN 11 04/11/2021 0916   CREATININE 0.45 07/10/2023 0910   CREATININE 0.59 01/03/2016 1752   CALCIUM 8.1 (L) 07/10/2023 0910   GFRNONAA >60 07/10/2023 0910   GFRNONAA >89 01/03/2016 1752   GFRAA 118 08/10/2019 1626   GFRAA >89 01/03/2016 1752    INR    Component Value Date/Time   INR 1.0 07/03/2023 1111     Intake/Output Summary (Last 24 hours) at 07/12/2023 0710 Last data filed at  07/12/2023 0606 Gross per 24 hour  Intake 600 ml  Output 2675 ml  Net -2075 ml      Assessment/Plan:  57 y.o. female is s/p:  Redo exposure of left CFA, endarterectomy left femoral artery, repair left CFA psa, revision of right to left femoral femoral BPG, Prevena vac placement left groin    6 Days Post-Op   -continues to have brisk doppler flow bilateral feet.  She did ambulate yesterday and did a couple of steps with mobility -feels she needs another day-hopefully home soon.   -DVT prophylaxis:  Lovenox -continue asa/plavix/statin  Doreatha Massed, PA-C [facility] [phone] 07/12/2023 7:10 AM  I have seen and evaluated the patient. I agree with the PA note as documented above.  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  Incisional VAC removed today in the left groin looks good.  Brisk DP signal in the left foot.  Plan discharge today.  Aspirin statin Plavix for risk reduction.  Will arrange follow-up in 2 to 3 weeks.  Discussed call with questions or concerns.  Cephus Shelling, MD  [facility] [place] Office: [phone]

## 2023-07-12 NOTE — Plan of Care (Signed)
  Problem: Education: Goal: Ability to describe self-care measures that may prevent or decrease complications (Diabetes Survival Skills Education) will improve Outcome: Adequate for Discharge Goal: Individualized Educational Video(s) Outcome: Adequate for Discharge   Problem: Coping: Goal: Ability to adjust to condition or change in health will improve Outcome: Adequate for Discharge   Problem: Fluid Volume: Goal: Ability to maintain a balanced intake and output will improve Outcome: Adequate for Discharge   Problem: Health Behavior/Discharge Planning: Goal: Ability to identify and utilize available resources and services will improve Outcome: Adequate for Discharge Goal: Ability to manage health-related needs will improve Outcome: Adequate for Discharge   Problem: Metabolic: Goal: Ability to maintain appropriate glucose levels will improve Outcome: Adequate for Discharge   Problem: Nutritional: Goal: Maintenance of adequate nutrition will improve Outcome: Adequate for Discharge Goal: Progress toward achieving an optimal weight will improve Outcome: Adequate for Discharge   Problem: Skin Integrity: Goal: Risk for impaired skin integrity will decrease Outcome: Adequate for Discharge   Problem: Tissue Perfusion: Goal: Adequacy of tissue perfusion will improve Outcome: Adequate for Discharge   Problem: Education: Goal: Knowledge of prescribed regimen will improve Outcome: Adequate for Discharge   Problem: Activity: Goal: Ability to tolerate increased activity will improve Outcome: Adequate for Discharge   Problem: Bowel/Gastric: Goal: Gastrointestinal status for postoperative course will improve Outcome: Adequate for Discharge   Problem: Clinical Measurements: Goal: Postoperative complications will be avoided or minimized Outcome: Adequate for Discharge Goal: Signs and symptoms of graft occlusion will improve Outcome: Adequate for Discharge   Problem: Skin  Integrity: Goal: Demonstration of wound healing without infection will improve Outcome: Adequate for Discharge   Problem: Acute Rehab PT Goals(only PT should resolve) Goal: Pt Will Ambulate Outcome: Adequate for Discharge Goal: Pt Will Go Up/Down Stairs Outcome: Adequate for Discharge   Problem: Education: Goal: Knowledge of General Education information will improve Description: Including pain rating scale, medication(s)/side effects and non-pharmacologic comfort measures Outcome: Adequate for Discharge   Problem: Health Behavior/Discharge Planning: Goal: Ability to manage health-related needs will improve Outcome: Adequate for Discharge   Problem: Clinical Measurements: Goal: Ability to maintain clinical measurements within normal limits will improve Outcome: Adequate for Discharge Goal: Will remain free from infection Outcome: Adequate for Discharge Goal: Diagnostic test results will improve Outcome: Adequate for Discharge Goal: Respiratory complications will improve Outcome: Adequate for Discharge Goal: Cardiovascular complication will be avoided Outcome: Adequate for Discharge   Problem: Activity: Goal: Risk for activity intolerance will decrease Outcome: Adequate for Discharge   Problem: Nutrition: Goal: Adequate nutrition will be maintained Outcome: Adequate for Discharge   Problem: Coping: Goal: Level of anxiety will decrease Outcome: Adequate for Discharge   Problem: Elimination: Goal: Will not experience complications related to bowel motility Outcome: Adequate for Discharge Goal: Will not experience complications related to urinary retention Outcome: Adequate for Discharge   Problem: Pain Managment: Goal: General experience of comfort will improve Outcome: Adequate for Discharge   Problem: Safety: Goal: Ability to remain free from injury will improve Outcome: Adequate for Discharge   Problem: Skin Integrity: Goal: Risk for impaired skin integrity  will decrease Outcome: Adequate for Discharge

## 2023-07-13 ENCOUNTER — Emergency Department (HOSPITAL_COMMUNITY)
Admission: EM | Admit: 2023-07-13 | Discharge: 2023-07-13 | Disposition: A | Discharge status: Home / Self Care | Payer: Medicare PPO | Attending: Emergency Medicine | Admitting: Emergency Medicine

## 2023-07-13 ENCOUNTER — Other Ambulatory Visit: Payer: Self-pay

## 2023-07-13 ENCOUNTER — Encounter (HOSPITAL_COMMUNITY): Payer: Self-pay

## 2023-07-13 DIAGNOSIS — Z79899 Other long term (current) drug therapy: Secondary | ICD-10-CM | POA: Diagnosis not present

## 2023-07-13 DIAGNOSIS — I10 Essential (primary) hypertension: Secondary | ICD-10-CM | POA: Insufficient documentation

## 2023-07-13 DIAGNOSIS — Z7902 Long term (current) use of antithrombotics/antiplatelets: Secondary | ICD-10-CM | POA: Insufficient documentation

## 2023-07-13 DIAGNOSIS — Z7982 Long term (current) use of aspirin: Secondary | ICD-10-CM | POA: Insufficient documentation

## 2023-07-13 DIAGNOSIS — Z8673 Personal history of transient ischemic attack (TIA), and cerebral infarction without residual deficits: Secondary | ICD-10-CM | POA: Insufficient documentation

## 2023-07-13 DIAGNOSIS — E119 Type 2 diabetes mellitus without complications: Secondary | ICD-10-CM | POA: Insufficient documentation

## 2023-07-13 DIAGNOSIS — Z7984 Long term (current) use of oral hypoglycemic drugs: Secondary | ICD-10-CM | POA: Diagnosis not present

## 2023-07-13 DIAGNOSIS — J449 Chronic obstructive pulmonary disease, unspecified: Secondary | ICD-10-CM | POA: Diagnosis not present

## 2023-07-13 DIAGNOSIS — R040 Epistaxis: Secondary | ICD-10-CM | POA: Diagnosis present

## 2023-07-13 LAB — I-STAT CHEM 8, ED
BUN: 7 mg/dL (ref 6–20)
Calcium, Ion: 1.08 mmol/L — ABNORMAL LOW (ref 1.15–1.40)
Chloride: 103 mmol/L (ref 98–111)
Creatinine, Ser: 0.4 mg/dL — ABNORMAL LOW (ref 0.44–1.00)
Glucose, Bld: 99 mg/dL (ref 70–99)
HCT: 22 % — ABNORMAL LOW (ref 36.0–46.0)
Hemoglobin: 7.5 g/dL — ABNORMAL LOW (ref 12.0–15.0)
Potassium: 3.9 mmol/L (ref 3.5–5.1)
Sodium: 138 mmol/L (ref 135–145)
TCO2: 23 mmol/L (ref 22–32)

## 2023-07-13 MED ORDER — OXYMETAZOLINE HCL 0.05 % NA SOLN
1.0000 | Freq: Once | NASAL | Status: AC
  Administered 2023-07-13: 1 via NASAL

## 2023-07-13 NOTE — Discharge Instructions (Signed)
You were seen in the emergency department today for nose bleed. Your nose bleed stopped. Continue taking medications as prescribed. Please return to the emergency department for worsening symptoms.

## 2023-07-13 NOTE — ED Provider Notes (Signed)
[place] EMERGENCY DEPARTMENT AT [facility] Provider Note   CSN: [id number] Arrival date & time: 07/13/23  1250     History  Chief Complaint  Patient presents with   Epistaxis    Andrea [name] is a 57 y.o. female. With past medical history of diabetes, hypertension, stroke, COPD, recent femoral artery pseudoaneurysm repair with femoral bypass who present to the emergency department with epistaxis.   States nosebleed began around 5-6AM this morning. Started spontaneously without trauma. Has not been able to stop it. She has felt a bit lightheaded without chest pain or shortness of breath. She has coughed up clots. On Plavix. States she had stopped taking this briefly for recent pseudoaneurysm repair and then restarted yesterday.   HPI     Home Medications Prior to Admission medications   Medication Sig Start Date End Date Taking? Authorizing Provider  amLODipine (NORVASC) 10 MG tablet Take 1 tablet (10 mg total) by mouth daily. 11/10/18   Benjiman Core D, PA-C  aspirin 81 MG tablet Take 1 tablet (81 mg total) by mouth daily. 04/27/17   Dunn, Tacey Ruiz, PA-C  cetirizine (ZYRTEC) 10 MG tablet Take 10 mg by mouth daily as needed for allergies.    [provider]  clopidogrel (PLAVIX) 75 MG tablet Take 1 tablet (75 mg total) by mouth daily. 11/10/18   Benjiman Core D, PA-C  Evolocumab (REPATHA SURECLICK) 140 MG/ML SOAJ Inject 140 mg into the skin every 14 (fourteen) days. 08/13/21   Nahser, Deloris Ping, MD  fluconazole (DIFLUCAN) 100 MG tablet Take 1 tablet (100 mg total) by mouth daily. 05/19/23   Napoleon Form, MD  fluticasone (FLONASE) 50 MCG/ACT nasal spray Place 2 sprays into both nostrils as needed for allergies or rhinitis.    [provider]  Fluticasone-Umeclidin-Vilant 200-62.5-25 MCG/ACT AEPB Inhale 1 puff into the lungs daily. 12/09/22   [provider]  Lancets MISC Test blood sugar once daily. 06/09/18   Benjiman Core D,  PA-C  lisinopril (PRINIVIL,ZESTRIL) 2.5 MG tablet Take 1 tablet (2.5 mg total) by mouth daily. 11/10/18   Benjiman Core D, PA-C  metFORMIN (GLUCOPHAGE) 1000 MG tablet Take 1 tablet (1,000 mg total) by mouth 2 (two) times daily with a meal. 11/10/18   Barnett Abu, [place] D, PA-C  omeprazole (PRILOSEC) 40 MG capsule Take 1 capsule (40 mg total) by mouth daily. 04/09/23   Esterwood, Amy S, PA-C  oxyCODONE-acetaminophen (PERCOCET) 5-325 MG tablet Take 1 tablet by mouth every 6 (six) hours as needed. 07/12/23   Rhyne, Ames Coupe, PA-C  simvastatin (ZOCOR) 20 MG tablet Take 1 tablet (20 mg total) by mouth at bedtime. 07/05/18   Benjiman Core D, PA-C  venlafaxine XR (EFFEXOR-XR) 150 MG 24 hr capsule Take 150 mg by mouth daily with breakfast.    [provider]      Allergies    Atorvastatin, Iohexol, Other, and Rosuvastatin    Review of Systems   Review of Systems  HENT:  Positive for nosebleeds.   All other systems reviewed and are negative.   Physical Exam Updated Vital Signs BP (!) 129/52   Pulse 72   Temp 98.1 F (36.7 C)   Resp 16   SpO2 100%  Physical Exam Vitals and nursing note reviewed.  Constitutional:      General: She is not in acute distress.    Appearance: Normal appearance. She is not ill-appearing or toxic-appearing.  HENT:     Head: Normocephalic.  Nose:     Right Nostril: No epistaxis.     Left Nostril: Epistaxis present. No septal hematoma or occlusion.     Left Turbinates: Enlarged and swollen.     Comments: Oozing bleed from left nare  Dried blood in posterior oropharynx  Eyes:     General: No scleral icterus. Cardiovascular:     Rate and Rhythm: Normal rate and regular rhythm.  Pulmonary:     Effort: Pulmonary effort is normal.     Breath sounds: Normal breath sounds.  Abdominal:     General: Bowel sounds are normal.     Palpations: Abdomen is soft.  Musculoskeletal:     Cervical back: Neck supple.  Skin:    General: Skin is warm and  dry.     Capillary Refill: Capillary refill takes less than 2 seconds.  Neurological:     General: No focal deficit present.     Mental Status: She is alert.  Psychiatric:        Mood and Affect: Mood normal.        Behavior: Behavior normal.     ED Results / Procedures / Treatments   Labs (all labs ordered are listed, but only abnormal results are displayed) Labs Reviewed  I-STAT CHEM 8, ED - Abnormal; Notable for the following components:      Result Value   Creatinine, Ser 0.40 (*)    Calcium, Ion 1.08 (*)    Hemoglobin 7.5 (*)    HCT 22.0 (*)    All other components within normal limits    EKG None  Radiology No results found.  Procedures Procedures   Medications Ordered in ED Medications  oxymetazoline (AFRIN) 0.05 % nasal spray 1 spray (1 spray Each Nare Given 07/13/23 1510)    ED Course/ Medical Decision Making/ A&P Clinical Course as of 07/14/23 2317  Mon Jul 13, 2023  1506 L epistaxis, anterior bleed. On plavix. Afrin. Awaiting hgb. [BB]  1513 Hemoglobin(!): 7.5 OK [BB]    Clinical Course User Index [BB] Fayrene Helper, MD   {   Medical Decision Making Amount and/or Complexity of Data Reviewed Labs:  Decision-making details documented in ED Course.  Risk OTC drugs.  Initial Impression and Ddx 57 year old female who presents to the emergency department with epistaxis. Patient PMH that increases complexity of ED encounter: Hypertension, stroke, COPD, femoral artery pseudoaneurysm s/p repair Differential: Traumatic epistaxis, AVM, anterior bleed, posterior bleed  Interpretation of Diagnostics I independent reviewed and interpreted the labs as followed: I-STAT hemoglobin is 7.5, similar to recent from postsurgical.  Do not feel this is related to acute blood loss  - I independently visualized the following imaging with scope of interpretation limited to determining acute life threatening conditions related to emergency care: Not  indicated  Patient Reassessment and Ultimate Disposition/Management 57 year old female who presents to the emergency department with epistaxis.  On my exam she is hemodynamically stable.  She does have mild oozing from the left nare turbinate.  There is no septal hematoma present.  There is no brisk bleeding.  The posterior oropharynx has dried blood but does not appear to be dripping blood into the posterior oropharynx. Obtaining a hemoglobin.  Will try Afrin and reassess.  Hemoglobin is 7-1/2, similar to when she was discharged.  Do not feel that this is new acute blood loss anemia.  We attempted with Afrin, epistaxis abated.  No need for further intervention at this time.  Will discharge her with return precautions for any worsening  symptoms.  She verbalized understanding.  The patient has been appropriately medically screened and/or stabilized in the ED. I have low suspicion for any other emergent medical condition which would require further screening, evaluation or treatment in the ED or require inpatient management. At time of discharge the patient is hemodynamically stable and in no acute distress. I have discussed work-up results and diagnosis with patient and answered all questions. Patient is agreeable with discharge plan. We discussed strict return precautions for returning to the emergency department and they verbalized understanding.     Patient management required discussion with the following services or consulting groups:  None  Complexity of Problems Addressed Acute complicated illness or Injury  Additional Data Reviewed and Analyzed Further history obtained from: Further history from spouse/family member, Past medical history and medications listed in the EMR, Recent discharge summary, and Prior labs/imaging results  Patient Encounter Risk Assessment Minor Procedures  Final Clinical Impression(s) / ED Diagnoses Final diagnoses:  Epistaxis    Rx / DC Orders ED Discharge  Orders     None         Cristopher Peru, PA-C 07/14/23 2319    Arby Barrette, MD 07/19/23 [phone]

## 2023-07-13 NOTE — ED Triage Notes (Signed)
Pt c/o nosebleed from L nare with clots since 0600; pt on Plavix; had pseudoaneurysm surgery last week, discharged yesterday

## 2023-08-03 NOTE — Progress Notes (Unsigned)
Patient name: Andrea [name] MRN: [id number] DOB: 08-21-1966 Sex: female  REASON FOR CONSULT: Postop check after repair of left femoral pseudoaneurysm in the setting of remote femoral-femoral bypass  HPI: Andrea [name] is a 57 y.o. female, with history of multiple medical issues including DM, HTN, HLD, tobacco abuse that presents for postop check after repair of left femoral pseudoaneurysm.  On 07/06/2023 she underwent revision of her right to left femorofemoral bypass with left femoral endarterectomy and repair of the left femoral pseudoaneurysm with dacyon interposition.  Pseudoaneurysm was noted on surveillance imaging.  Patient initially underwent right to left femorofemoral bypass by Dr. Madilyn Fireman in 2003 using 7 mm Dacron.  She had to have a revision later with bilateral femoral patch angioplasty in 2005. She has also undergone a right common iliac artery stent here by Dr. Madilyn Fireman and another iliac stent at [facility].         Past Medical History:  Diagnosis Date   Allergy    Anemia    Anxiety    on Alprazolam   Arthritis    both hips   Asthma    Bilateral carotid artery stenosis 10/18/2014   Cancer (HCC)    skin cancer   Carotid artery occlusion    a. s/p prior endarterectomies with known RICA occlusion - followed by [facility].   COPD (chronic obstructive pulmonary disease) (HCC)    CVA (cerebral infarction) 06/22/2014   Depression    on Effexor   Diabetes mellitus with circulatory complication (HCC)    2011   Diastolic dysfunction without heart failure    DM (diabetes mellitus) with complications (HCC) 08/29/2014   GERD (gastroesophageal reflux disease)    Heart murmur    was seen by cardiologist in [facility] 2013   History of kidney stones    HLD (hyperlipidemia) 08/29/2014   HTN (hypertension), benign 08/29/2014   Hyperlipidemia    Hypertension    Occlusion and stenosis of carotid artery without mention of cerebral infarction 04/12/2014   PVD (peripheral vascular  disease) (HCC)    a. s/p prior LE bypass grafting and revision (followed by [facility]).   Shingles 06/07/2016   Stroke (HCC) 06/22/2014   TIA   TIA (transient ischemic attack)    a. possible mini stroke in 2015 - was told she did not have full stroke.    Past Surgical History:  Procedure Laterality Date   2 stents     ABDOMINAL AORTOGRAM W/LOWER EXTREMITY N/A 01/20/2019   Procedure: ABDOMINAL AORTOGRAM W/LOWER EXTREMITY;  Surgeon: Cephus Shelling, MD;  Location: [facility] INVASIVE CV LAB;  Service: Cardiovascular;  Laterality: N/A;   CAROTID ENDARTERECTOMY     x 2   CESAREAN SECTION     CHOLECYSTECTOMY     EXTRACORPOREAL SHOCK WAVE LITHOTRIPSY Left 03/29/2018   Procedure: LEFT EXTRACORPOREAL SHOCK WAVE LITHOTRIPSY (ESWL);  Surgeon: Marcine Matar, MD;  Location: [facility] ORS;  Service: Urology;  Laterality: Left;   FALSE ANEURYSM REPAIR Left 07/06/2023   Procedure: REPAIR OF LEFT FEMORAL PSEUDOANEURYSM, LEFT COMMON FEMORAL ENDARECTOMY, REDO EXPOSURE OF LEFT COMMON FEMORAL ARTERY GREATER THAN 30 DAYS, APPLICATION OF MYRIDAD POWDER, APPLICATION OF WOUND VAC SYSTEM;  Surgeon: Cephus Shelling, MD;  Location: [facility] OR;  Service: Vascular;  Laterality: Left;   FEMORAL BYPASS     x 2 on right side due to re-stenosis   FEMORAL-FEMORAL BYPASS GRAFT Bilateral 07/06/2023   Procedure: REVISION OF FEMORAL-FEMORAL ARTERY BYPASS WITH NEW INTERPOSITION;  Surgeon: Cephus Shelling, MD;  Location: [facility] OR;  Service: Vascular;  Laterality: Bilateral;   left/right carotid artery     2003 and 2009 for right, right side is 100% occluded per patient; left side 2009   TUBAL LIGATION      Family History  Problem Relation Age of Onset   Diabetes Mother    Heart disease Mother    Hyperlipidemia Mother    Hypertension Mother    Kidney failure Mother    Heart disease Father        MI, unknown age   Hyperlipidemia Father    Hypertension Father    Skin cancer Sister    Varicose Veins Sister    Heart attack  Brother        Age 48   Diabetes Maternal Grandmother    Heart disease Maternal Grandmother    Hyperlipidemia Maternal Grandmother    Cancer Paternal Grandmother        unknown type- possibly stomach   Liver disease Son        fatty liver   Colon cancer Neg Hx    Esophageal cancer Neg Hx     SOCIAL HISTORY: Social History   Socioeconomic History   Marital status: Married    Spouse name: Not on file   Number of children: 1   Years of education: College   Highest education level: Not on file  Occupational History   Occupation: retired/disabled    Associate Professor: UNEMPLOYED  Tobacco Use   Smoking status: Every Day    Current packs/day: 1.00    Average packs/day: 1 pack/day for 40.0 years (40.0 ttl pk-yrs)    Types: Cigarettes   Smokeless tobacco: Never  Vaping Use   Vaping status: Never Used  Substance and Sexual Activity   Alcohol use: Yes    Alcohol/week: 0.0 standard drinks of alcohol    Comment: holidays/special occas 3-4 drinks   Drug use: No   Sexual activity: Not Currently    Partners: Male  Other Topics Concern   Not on file  Social History Narrative   Pt is from [facility]   Patient lives at home with her husband and son   Patient is right handed   Patient drinks coffee daily   Social Determinants of Health   Financial Resource Strain: Not on file  Food Insecurity: No Food Insecurity (07/06/2023)   Hunger Vital Sign    Worried About Running Out of Food in the Last Year: Never true    Ran Out of Food in the Last Year: Never true  Transportation Needs: No Transportation Needs (07/06/2023)   PRAPARE - Administrator, Civil Service (Medical): No    Lack of Transportation (Non-Medical): No  Physical Activity: Inactive (07/05/2018)   Exercise Vital Sign    Days of Exercise per Week: 0 days    Minutes of Exercise per Session: 0 min  Stress: Stress Concern Present (07/05/2018)   [employer] of Occupational Health - Occupational Stress  Questionnaire    Feeling of Stress : To some extent  Social Connections: Socially Integrated (07/05/2018)   Social Connection and Isolation Panel [NHANES]    Frequency of Communication with Friends and Family: More than three times a week    Frequency of Social Gatherings with Friends and Family: Twice a week    Attends Religious Services: 1 to 4 times per year    Active Member of [employer] or Organizations: Yes    Attends Banker Meetings: 1 to 4 times per  year    Marital Status: Married  Catering manager Violence: Not At Risk (07/06/2023)   Humiliation, Afraid, Rape, and Kick questionnaire    Fear of Current or Ex-Partner: No    Emotionally Abused: No    Physically Abused: No    Sexually Abused: No    Allergies  Allergen Reactions   Atorvastatin     Muscle cramps   Iohexol Hives    Patient should take 13 hours prep    Other Hives    Contrast dye    Rosuvastatin Nausea And Vomiting    Current Outpatient Medications  Medication Sig Dispense Refill   amLODipine (NORVASC) 10 MG tablet Take 1 tablet (10 mg total) by mouth daily. 90 tablet 3   aspirin 81 MG tablet Take 1 tablet (81 mg total) by mouth daily. 30 tablet 11   cetirizine (ZYRTEC) 10 MG tablet Take 10 mg by mouth daily as needed for allergies.     clopidogrel (PLAVIX) 75 MG tablet Take 1 tablet (75 mg total) by mouth daily. 90 tablet 3   Evolocumab (REPATHA SURECLICK) 140 MG/ML SOAJ Inject 140 mg into the skin every 14 (fourteen) days. 6 mL 3   fluconazole (DIFLUCAN) 100 MG tablet Take 1 tablet (100 mg total) by mouth daily. 28 tablet 0   fluticasone (FLONASE) 50 MCG/ACT nasal spray Place 2 sprays into both nostrils as needed for allergies or rhinitis.     Fluticasone-Umeclidin-Vilant 200-62.5-25 MCG/ACT AEPB Inhale 1 puff into the lungs daily.     Lancets MISC Test blood sugar once daily. 100 each 2   lisinopril (PRINIVIL,ZESTRIL) 2.5 MG tablet Take 1 tablet (2.5 mg total) by mouth daily. 90 tablet 3    metFORMIN (GLUCOPHAGE) 1000 MG tablet Take 1 tablet (1,000 mg total) by mouth 2 (two) times daily with a meal. 180 tablet 3   omeprazole (PRILOSEC) 40 MG capsule Take 1 capsule (40 mg total) by mouth daily. 30 capsule 4   oxyCODONE-acetaminophen (PERCOCET) 5-325 MG tablet Take 1 tablet by mouth every 6 (six) hours as needed. 30 tablet 0   simvastatin (ZOCOR) 20 MG tablet Take 1 tablet (20 mg total) by mouth at bedtime. 90 tablet 3   venlafaxine XR (EFFEXOR-XR) 150 MG 24 hr capsule Take 150 mg by mouth daily with breakfast.     Current Facility-Administered Medications  Medication Dose Route Frequency Provider Last Rate Last Admin   0.9 %  sodium chloride infusion  500 mL Intravenous Continuous Armbruster, Willaim Rayas, MD        REVIEW OF SYSTEMS:  [X]  denotes positive finding, [ ]  denotes negative finding Cardiac  Comments:  Chest pain or chest pressure:    Shortness of breath upon exertion:    Short of breath when lying flat:    Irregular heart rhythm:        Vascular    Pain in calf, thigh, or hip brought on by ambulation:    Pain in feet at night that wakes you up from your sleep:     Blood clot in your veins:    Leg swelling:         Pulmonary    Oxygen at home:    Productive cough:     Wheezing:         Neurologic    Sudden weakness in arms or legs:     Sudden numbness in arms or legs:     Sudden onset of difficulty speaking or slurred speech:    Temporary loss  of vision in one eye:     Problems with dizziness:         Gastrointestinal    Blood in stool:     Vomited blood:         Genitourinary    Burning when urinating:     Blood in urine:        Psychiatric    Major depression:         Hematologic    Bleeding problems:    Problems with blood clotting too easily:        Skin    Rashes or ulcers:        Constitutional    Fever or chills:      PHYSICAL EXAM: There were no vitals filed for this visit.   GENERAL: The patient is a well-nourished female, in  no acute distress. The vital signs are documented above. CARDIAC: There is a regular rate and rhythm.  VASCULAR:  Palpable pulse in fem fem bypass graft Pulsatile mass left groin No palpable pedal pulses but no tissue loss PULMONARY: No respiratory distress. ABDOMEN: Soft and non-tender. MUSCULOSKELETAL: There are no major deformities or cyanosis. NEUROLOGIC: No focal weakness or paresthesias are detected.  DATA:     Assessment/Plan:  57 year old female presents for postop check after repair of left femoral pseudoaneurysm including left femoral endarterectomy and revision of her femoral-femoral bypass graft with dacron interposition in the setting of remote right to left femoral-femoral bypass by Dr. Madilyn Fireman.   Cephus Shelling, MD [facility] Office: [phone]  Cephus Shelling

## 2023-08-04 ENCOUNTER — Ambulatory Visit (INDEPENDENT_AMBULATORY_CARE_PROVIDER_SITE_OTHER): Payer: Medicare PPO | Admitting: Vascular Surgery

## 2023-08-04 ENCOUNTER — Encounter: Payer: Self-pay | Admitting: Vascular Surgery

## 2023-08-04 VITALS — BP 120/67 | HR 58 | Temp 97.7°F | Resp 16 | Ht 63.0 in | Wt 127.9 lb

## 2023-08-04 DIAGNOSIS — I724 Aneurysm of artery of lower extremity: Secondary | ICD-10-CM

## 2023-08-07 ENCOUNTER — Other Ambulatory Visit: Payer: Self-pay

## 2023-08-07 DIAGNOSIS — I739 Peripheral vascular disease, unspecified: Secondary | ICD-10-CM

## 2023-10-27 ENCOUNTER — Encounter: Payer: Self-pay | Admitting: Gastroenterology

## 2023-11-03 ENCOUNTER — Other Ambulatory Visit: Payer: Self-pay

## 2023-11-03 DIAGNOSIS — J449 Chronic obstructive pulmonary disease, unspecified: Secondary | ICD-10-CM | POA: Insufficient documentation

## 2023-11-03 DIAGNOSIS — K219 Gastro-esophageal reflux disease without esophagitis: Secondary | ICD-10-CM | POA: Insufficient documentation

## 2023-11-03 DIAGNOSIS — J45909 Unspecified asthma, uncomplicated: Secondary | ICD-10-CM | POA: Insufficient documentation

## 2023-11-04 ENCOUNTER — Ambulatory Visit: Payer: Medicare PPO | Attending: Cardiology | Admitting: Cardiology

## 2023-11-04 ENCOUNTER — Encounter: Payer: Self-pay | Admitting: Cardiology

## 2023-11-04 VITALS — BP 120/60 | HR 66 | Ht 63.0 in | Wt 125.6 lb

## 2023-11-04 DIAGNOSIS — Z95828 Presence of other vascular implants and grafts: Secondary | ICD-10-CM | POA: Diagnosis not present

## 2023-11-04 DIAGNOSIS — I739 Peripheral vascular disease, unspecified: Secondary | ICD-10-CM

## 2023-11-04 DIAGNOSIS — E782 Mixed hyperlipidemia: Secondary | ICD-10-CM | POA: Diagnosis not present

## 2023-11-04 DIAGNOSIS — E0851 Diabetes mellitus due to underlying condition with diabetic peripheral angiopathy without gangrene: Secondary | ICD-10-CM | POA: Diagnosis not present

## 2023-11-04 MED ORDER — NITROGLYCERIN 0.4 MG SL SUBL: 0.4000 mg | SUBLINGUAL_TABLET | SUBLINGUAL | 6 refills | Status: AC | PRN

## 2023-11-04 MED ORDER — REPATHA SURECLICK 140 MG/ML ~~LOC~~ SOAJ: 140.0000 mg | SUBCUTANEOUS | 3 refills | Status: DC

## 2023-11-04 NOTE — Patient Instructions (Addendum)
Medication Instructions:  Your physician has recommended you make the following change in your medication:   Use nitroglycerin 1 tablet placed under the tongue at the first sign of chest pain or an angina attack. 1 tablet may be used every 5 minutes as needed, for up to 15 minutes. Do not take more than 3 tablets in 15 minutes. If pain persist call 911 or go to the nearest ED.  *If you need a refill on your cardiac medications before your next appointment, please call your pharmacy*   Lab Work: None ordered If you have labs (blood work) drawn today and your tests are completely normal, you will receive your results only by: MyChart Message (if you have MyChart) OR A paper copy in the mail If you have any lab test that is abnormal or we need to change your treatment, we will call you to review the results.   Testing/Procedures: None ordered   Follow-Up: At [facility], you and your health needs are our priority.  As part of our continuing mission to provide you with exceptional heart care, we have created designated Provider Care Teams.  These Care Teams include your primary Cardiologist (physician) and Advanced Practice Providers (APPs -  Physician Assistants and Nurse Practitioners) who all work together to provide you with the care you need, when you need it.  We recommend signing up for the patient portal called "MyChart".  Sign up information is provided on this After Visit Summary.  MyChart is used to connect with patients for Virtual Visits (Telemedicine).  Patients are able to view lab/test results, encounter notes, upcoming appointments, etc.  Non-urgent messages can be sent to your provider as well.   To learn more about what you can do with MyChart, go to [email or web address].    Your next appointment:   9 month(s)  The format for your next appointment:   In Person  Provider:   Belva Crome, MD    Other Instructions none  Important Information About  Sugar

## 2023-11-04 NOTE — Progress Notes (Signed)
Cardiology Office Note:    Date:  11/04/2023   ID:  Andrea [name], DOB 05-20-1966, MRN [id number]  PCP:  Erskine Emery, NP  Cardiologist:  Garwin Brothers, MD   Referring MD: Erskine Emery, NP    ASSESSMENT:    1. PAD (peripheral artery disease) (HCC)   2. Diabetes mellitus due to underlying condition with diabetic peripheral angiopathy without gangrene, without long-term current use of insulin (HCC)   3. S/P femoral-femoral bypass surgery   4. Mixed hyperlipidemia    PLAN:    In order of problems listed above:  Coronary artery disease: This is diagnosed by CT scan.  Patient asymptomatic.  Medical management at this time.  I told her to walk at least half an hour a day on a regular basis if possible. Mixed dyslipidemia: On lipid-lowering medications followed by primary care.  Lipids are at goal. Peripheral vascular disease: Stable.  Asymptomatic.  She is ambulating to the best of her ability. Ex smoker: She quit in July and promises never to get back to smoking. Patient will be seen in follow-up appointment in 6 months or earlier if the patient has any concerns.    Medication Adjustments/Labs and Tests Ordered: Current medicines are reviewed at length with the patient today.  Concerns regarding medicines are outlined above.  No orders of the defined types were placed in this encounter.  No orders of the defined types were placed in this encounter.    No chief complaint on file.    History of Present Illness:    Andrea [name] is a 57 y.o. female.  Patient has past medical history of bilateral lower extremity vascular stenosis post bypass, mixed dyslipidemia and coronary artery calcification.  She denies any problems at this time and takes care of activities of daily living.  No chest pain orthopnea or PND.  She tells me that she walks up to 5000 steps on a regular basis.  She denies any symptoms with this.  She quit smoking in July.  She is happy about  it.  Past Medical History:  Diagnosis Date   Allergy    Anemia    Anxiety    on Alprazolam   Arthritis    both hips   Asthma    Bilateral carotid artery stenosis 10/18/2014   Cancer (HCC)    skin cancer   Carotid artery occlusion    a. s/p prior endarterectomies with known RICA occlusion - followed by [facility].   Carotid stenosis 11/12/2011   S/p bilat CEA. Right 100% occluded, left 50-70% stenosed.     Cerebral infarction (HCC) 06/22/2014   IMO SNOMED Dx Update Oct 2024     COPD (chronic obstructive pulmonary disease) (HCC)    Depression    on Effexor   Diabetes mellitus with circulatory complication (HCC)    2011   Diastolic dysfunction without heart failure    DM (diabetes mellitus) with complications (HCC) 08/29/2014   GERD (gastroesophageal reflux disease)    Heart murmur    was seen by cardiologist in [facility] 2013   History of kidney stones    HLD (hyperlipidemia) 08/29/2014   HTN (hypertension), benign 08/29/2014   Hyperlipidemia    Hypertension    Mild aortic stenosis 04/29/2022   Occlusion and stenosis of carotid artery without mention of cerebral infarction 04/12/2014   PAD (peripheral artery disease) (HCC) 07/06/2023   Pseudoaneurysm of femoral artery (HCC) 06/16/2023   Pseudoaneurysm of left femoral artery (HCC) 07/06/2023  PVD (peripheral vascular disease) (HCC)    a. s/p prior LE bypass grafting and revision (followed by [facility]).   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 possible mini stroke in 2015 - was told she did not have full stroke.    Past Surgical History:  Procedure Laterality Date   2 stents     ABDOMINAL AORTOGRAM W/LOWER EXTREMITY N/A 01/20/2019   Procedure: ABDOMINAL AORTOGRAM W/LOWER EXTREMITY;  Surgeon: Cephus Shelling, MD;  Location: [facility];  Service: Cardiovascular;  Laterality: N/A;   CAROTID ENDARTERECTOMY     x 2    CESAREAN SECTION     CHOLECYSTECTOMY     EXTRACORPOREAL SHOCK WAVE LITHOTRIPSY Left 03/29/2018   Procedure: LEFT EXTRACORPOREAL SHOCK WAVE LITHOTRIPSY (ESWL);  Surgeon: Marcine Matar, MD;  Location: [facility];  Service: Urology;  Laterality: Left;   FALSE ANEURYSM REPAIR Left 07/06/2023   Procedure: REPAIR OF LEFT FEMORAL PSEUDOANEURYSM, LEFT COMMON FEMORAL ENDARECTOMY, REDO EXPOSURE OF LEFT COMMON FEMORAL ARTERY GREATER THAN 30 DAYS, APPLICATION OF MYRIDAD POWDER, APPLICATION OF WOUND VAC SYSTEM;  Surgeon: Cephus Shelling, MD;  Location: [facility] OR;  Service: Vascular;  Laterality: Left;   FEMORAL BYPASS     x 2 on right side due to re-stenosis   FEMORAL-FEMORAL BYPASS GRAFT Bilateral 07/06/2023   Procedure: REVISION OF FEMORAL-FEMORAL ARTERY BYPASS WITH NEW INTERPOSITION;  Surgeon: Cephus Shelling, MD;  Location: [facility] OR;  Service: Vascular;  Laterality: Bilateral;   left/right carotid artery     2003 and 2009 for right, right side is 100% occluded per patient; left side 2009   TUBAL LIGATION      Current Medications: Current Meds  Medication Sig   aspirin 81 MG tablet Take 1 tablet (81 mg total) by mouth daily.   cetirizine (ZYRTEC) 10 MG tablet Take 10 mg by mouth daily as needed for allergies.   clopidogrel (PLAVIX) 75 MG tablet Take 1 tablet (75 mg total) by mouth daily.   FARXIGA 10 MG TABS tablet Take 10 mg by mouth daily.   fluticasone (FLONASE) 50 MCG/ACT nasal spray Place 2 sprays into both nostrils as needed for allergies or rhinitis.   Fluticasone-Umeclidin-Vilant 200-62.5-25 MCG/ACT AEPB Inhale 1 puff into the lungs daily.   Lancets MISC Test blood sugar once daily.   lisinopril (PRINIVIL,ZESTRIL) 2.5 MG tablet Take 1 tablet (2.5 mg total) by mouth daily.   metFORMIN (GLUCOPHAGE) 1000 MG tablet Take 1 tablet (1,000 mg total) by mouth 2 (two) times daily with a meal.   omeprazole (PRILOSEC) 40 MG capsule Take 1 capsule (40 mg total) by mouth daily.   simvastatin (ZOCOR)  40 MG tablet Take 40 mg by mouth daily.   Venlafaxine HCl 225 MG TB24 Take 1 tablet by mouth daily.   Current Facility-Administered Medications for the 11/04/23 encounter (Office Visit) with Tramayne Sebesta, Aundra Dubin, MD  Medication   0.9 %  sodium chloride infusion     Allergies:   Atorvastatin, Iohexol, Other, and Rosuvastatin   Social History   Socioeconomic History   Marital status: Married    Spouse name: Not on file   Number of children: 1   Years of education: College   Highest education level: Not on file  Occupational History   Occupation: retired/disabled    Employer: UNEMPLOYED  Tobacco Use   Smoking status: Every Day    Current packs/day: 1.00  Average packs/day: 1 pack/day for 40.0 years (40.0 ttl pk-yrs)    Types: Cigarettes   Smokeless tobacco: Never  Vaping Use   Vaping status: Never Used  Substance and Sexual Activity   Alcohol use: Yes    Alcohol/week: 0.0 standard drinks of alcohol    Comment: holidays/special occas 3-4 drinks   Drug use: No   Sexual activity: Not Currently    Partners: Male  Other Topics Concern   Not on file  Social History Narrative   Pt is from [facility]   Patient lives at home with her husband and son   Patient is right handed   Patient drinks coffee daily   Social Determinants of Health   Financial Resource Strain: Not on file  Food Insecurity: No Food Insecurity (07/06/2023)   Hunger Vital Sign    Worried About Running Out of Food in the Last Year: Never true    Ran Out of Food in the Last Year: Never true  Transportation Needs: No Transportation Needs (07/06/2023)   PRAPARE - Administrator, Civil Service (Medical): No    Lack of Transportation (Non-Medical): No  Physical Activity: Inactive (07/05/2018)   Exercise Vital Sign    Days of Exercise per Week: 0 days    Minutes of Exercise per Session: 0 min  Stress: Stress Concern Present (07/05/2018)   [employer] of Occupational Health - Occupational  Stress Questionnaire    Feeling of Stress : To some extent  Social Connections: Socially Integrated (07/05/2018)   Social Connection and Isolation Panel [NHANES]    Frequency of Communication with Friends and Family: More than three times a week    Frequency of Social Gatherings with Friends and Family: Twice a week    Attends Religious Services: 1 to 4 times per year    Active Member of [employer] or Organizations: Yes    Attends Engineer, structural: 1 to 4 times per year    Marital Status: Married     Family History: The patient's family history includes Cancer in her paternal grandmother; Diabetes in her maternal grandmother and mother; Heart attack in her brother; Heart disease in her father, maternal grandmother, and mother; Hyperlipidemia in her father, maternal grandmother, and mother; Hypertension in her father and mother; Kidney failure in her mother; Liver disease in her son; Skin cancer in her sister; Varicose Veins in her sister. There is no history of Colon cancer or Esophageal cancer.  ROS:   Please see the history of present illness.    All other systems reviewed and are negative.  EKGs/Labs/Other Studies Reviewed:    The following studies were reviewed today: I discussed my findings with the patient at length   Recent Labs: 07/03/2023: ALT 12 07/09/2023: Platelets 246 07/13/2023: BUN 7; Creatinine, Ser 0.40; Hemoglobin 7.5; Potassium 3.9; Sodium 138  Recent Lipid Panel    Component Value Date/Time   CHOL 86 07/07/2023 0420   CHOL 108 04/11/2021 0916   TRIG 94 07/07/2023 0420   HDL 23 (L) 07/07/2023 0420   HDL 41 04/11/2021 0916   CHOLHDL 3.7 07/07/2023 0420   VLDL 19 07/07/2023 0420   LDLCALC 44 07/07/2023 0420   LDLCALC 39 04/11/2021 0916    Physical Exam:    VS:  BP 120/60   Pulse 66   Ht 5\' 3"  (1.6 m)   Wt 125 lb 9.6 oz (57 kg)   SpO2 98%   BMI 22.25 kg/m     Wt Readings from  Last 3 Encounters:  11/04/23 125 lb 9.6 oz (57 kg)  08/04/23 127 lb  14.4 oz (58 kg)  07/06/23 128 lb (58.1 kg)     GEN: Patient is in no acute distress HEENT: Normal NECK: No JVD; No carotid bruits LYMPHATICS: No lymphadenopathy CARDIAC: Hear sounds regular, 2/6 systolic murmur at the apex. RESPIRATORY:  Clear to auscultation without rales, wheezing or rhonchi  ABDOMEN: Soft, non-tender, non-distended MUSCULOSKELETAL:  No edema; No deformity  SKIN: Warm and dry NEUROLOGIC:  Alert and oriented x 3 PSYCHIATRIC:  Normal affect   Signed, Garwin Brothers, MD  11/04/2023 1:26 PM    [facility]

## 2023-11-10 ENCOUNTER — Encounter (HOSPITAL_COMMUNITY): Payer: Medicare PPO

## 2023-11-10 ENCOUNTER — Ambulatory Visit: Payer: Medicare PPO | Admitting: Vascular Surgery

## 2023-11-11 ENCOUNTER — Other Ambulatory Visit: Payer: Self-pay | Admitting: Student

## 2023-11-11 DIAGNOSIS — Z1231 Encounter for screening mammogram for malignant neoplasm of breast: Secondary | ICD-10-CM

## 2023-11-17 ENCOUNTER — Ambulatory Visit
Admission: RE | Admit: 2023-11-17 | Discharge: 2023-11-17 | Disposition: A | Discharge status: Home / Self Care | Payer: Medicare PPO | Source: Ambulatory Visit | Attending: Family | Admitting: Family

## 2023-11-17 DIAGNOSIS — Z1231 Encounter for screening mammogram for malignant neoplasm of breast: Secondary | ICD-10-CM

## 2023-12-01 ENCOUNTER — Ambulatory Visit (HOSPITAL_COMMUNITY): Payer: Medicare PPO

## 2023-12-01 ENCOUNTER — Ambulatory Visit: Payer: Medicare PPO | Admitting: Vascular Surgery

## 2024-01-04 ENCOUNTER — Ambulatory Visit: Payer: Medicare PPO | Admitting: Physician Assistant

## 2024-01-04 ENCOUNTER — Encounter: Payer: Self-pay | Admitting: Physician Assistant

## 2024-01-04 ENCOUNTER — Telehealth: Payer: Self-pay

## 2024-01-04 VITALS — BP 110/72 | HR 73 | Ht 63.0 in | Wt 125.0 lb

## 2024-01-04 DIAGNOSIS — Z8719 Personal history of other diseases of the digestive system: Secondary | ICD-10-CM

## 2024-01-04 DIAGNOSIS — K2270 Barrett's esophagus without dysplasia: Secondary | ICD-10-CM

## 2024-01-04 MED ORDER — OMEPRAZOLE 40 MG PO CPDR: 40.0000 mg | DELAYED_RELEASE_CAPSULE | Freq: Every day | ORAL | 4 refills | Status: DC

## 2024-01-04 NOTE — Telephone Encounter (Signed)
  Andrea [name] Jun 04, 1966 [id number]     Dear Dr Gretta  We have scheduled the above named patient for a(n) Endoscopy procedure. Our records show that (s)he is on anticoagulation therapy.  Please advise as to whether the patient may come off their therapy of Plavix  5 days prior to their procedure which is scheduled for 01/27/24.  Please route your response to [facility] or fax response to [phone].  Sincerely,    [facility]

## 2024-01-04 NOTE — Progress Notes (Signed)
 Subjective:    Patient ID: Andrea [name], female    DOB: 1966/06/08, 58 y.o.   MRN: [id number]  HPI  Andrea [name] is a pleasant 58 year old female, established with Dr. Shila, who comes in today for follow-up and to schedule follow-up EGD. She had undergone EGD and colonoscopy in May 2024, and at EGD was found to have grade B esophagitis as well as evidence of Candida esophagitis.  Biopsies showed intestinal metaplasia consistent with Barrett's esophagus, and Dr. Shila indicated that she wanted to repeat EGD in 3 months to document healing and take further biopsies.  Patient did complete a course of Diflucan  and has continued on omeprazole  40 mg p.o. every morning.  She said that she did run out of that and has not been on it over the past couple of months. She is not currently having any problems with heartburn or indigestion, denies any dysphagia or odynophagia and is not having any issues with abdominal pain. She also had colonoscopy at that same setting 05/19/2023 -but had a poor prep with residual stool/large amount in the rectum and procedure was aborted.  She returned the following day on 05/20/2023 after further prep and had colonoscopy with Dr. Leigh with removal of 4 polyps, largest 4 mm and was also noted to have internal hemorrhoids.  Path showed 2 tubular adenoma, 1 sessile serrated polyp and 1 hyperplastic polyp.  She is indicated for follow-up in 7 years. Patient does have history of significant vascular disease, status post bilateral carotid endarterectomies, has history of CVA/TIA 2015, hypertension, diabetes mellitus, mild aortic stenosis, peripheral arterial disease with prior femorofemoral bypass.  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  She says this was a 3 pound aneurysm.  She is followed by Dr. Lonni Gaskins, and is maintained on chronic Plavix  and baby aspirin . She also has  diabetes mellitus and COPD without oxygen use.  Review of Systems Pertinent positive and negative review of systems were noted in the above HPI section.  All other review of systems was otherwise negative.   Outpatient Encounter Medications as of 01/04/2024  Medication Sig   aspirin  81 MG tablet Take 1 tablet (81 mg total) by mouth daily.   cetirizine  (ZYRTEC ) 10 MG tablet Take 10 mg by mouth daily as needed for allergies.   clopidogrel  (PLAVIX ) 75 MG tablet Take 1 tablet (75 mg total) by mouth daily.   Evolocumab  (REPATHA  SURECLICK) 140 MG/ML SOAJ Inject 140 mg into the skin every 14 (fourteen) days.   FARXIGA 10 MG TABS tablet Take 10 mg by mouth daily.   fluticasone  (FLONASE ) 50 MCG/ACT nasal spray Place 2 sprays into both nostrils as needed for allergies or rhinitis.   Fluticasone -Umeclidin-Vilant 200-62.5-25 MCG/ACT AEPB Inhale 1 puff into the lungs daily.   Lancets MISC Test blood sugar once daily.   lisinopril  (PRINIVIL ,ZESTRIL ) 2.5 MG tablet Take 1 tablet (2.5 mg total) by mouth daily.   metFORMIN  (GLUCOPHAGE ) 1000 MG tablet Take 1 tablet (1,000 mg total) by mouth 2 (two) times daily with a meal.   nitroGLYCERIN  (NITROSTAT ) 0.4 MG SL tablet Place 1 tablet (0.4 mg total) under the tongue every 5 (five) minutes as needed.   simvastatin  (ZOCOR ) 40 MG tablet Take 40 mg by mouth daily.   Venlafaxine  HCl 225 MG TB24 Take 1 tablet by mouth daily.   [DISCONTINUED] omeprazole  (PRILOSEC) 40 MG capsule Take 1 capsule (40 mg total) by  mouth daily.   omeprazole  (PRILOSEC) 40 MG capsule Take 1 capsule (40 mg total) by mouth daily.   oxyCODONE -acetaminophen  (PERCOCET) 5-325 MG tablet Take 1 tablet by mouth every 6 (six) hours as needed. (Patient not taking: Reported on 01/04/2024)   Facility-Administered Encounter Medications as of 01/04/2024  Medication   0.9 %  sodium chloride  infusion   Allergies  Allergen Reactions   Atorvastatin      Muscle cramps   Iohexol  Hives    Patient should take 13  hours prep    Other Hives    Contrast dye    Rosuvastatin  Nausea And Vomiting   Patient Active Problem List   Diagnosis Date Noted   Asthma    COPD (chronic obstructive pulmonary disease) (HCC)    GERD (gastroesophageal reflux disease)    PAD (peripheral artery disease) (HCC) 07/06/2023   Pseudoaneurysm of left femoral artery (HCC) 07/06/2023   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   Pseudoaneurysm of femoral artery (HCC) 06/16/2023   Allergy 04/29/2022   Anemia 04/29/2022   Anxiety 04/29/2022   Arthritis 04/29/2022   Cancer (HCC) 04/29/2022   Carotid artery occlusion 04/29/2022   Depression 04/29/2022   Diabetes mellitus with circulatory complication (HCC) 04/29/2022   Diastolic dysfunction without heart failure 04/29/2022   Heart murmur 04/29/2022   History of kidney stones 04/29/2022   Hyperlipidemia 04/29/2022   Hypertension 04/29/2022   Mild aortic stenosis 04/29/2022   Shingles 06/07/2016   Bilateral carotid artery stenosis 10/18/2014   TIA (transient ischemic attack) 08/29/2014   HLD (hyperlipidemia) 08/29/2014   DM (diabetes mellitus) with complications (HCC) 08/29/2014   HTN (hypertension), benign 08/29/2014   Stroke (HCC) 06/22/2014   Cerebral infarction (HCC) 06/22/2014   PVD (peripheral vascular disease) (HCC) 04/12/2014   Occlusion and stenosis of carotid artery without mention of cerebral infarction 04/12/2014   Carotid stenosis 11/12/2011   Social History   Socioeconomic History   Marital status: Married    Spouse name: Not on file   Number of children: 1   Years of education: College   Highest education level: Not on file  Occupational History   Occupation: retired/disabled    Associate Professor: UNEMPLOYED  Tobacco Use   Smoking status: Every Day    Current packs/day: 1.00    Average packs/day: 1 pack/day for 40.0 years (40.0 ttl pk-yrs)    Types: Cigarettes   Smokeless tobacco: Never  Vaping Use   Vaping status: Never Used  Substance and  Sexual Activity   Alcohol use: Yes    Alcohol/week: 0.0 standard drinks of alcohol    Comment: holidays/special occas 3-4 drinks   Drug use: No   Sexual activity: Not Currently    Partners: Male  Other Topics Concern   Not on file  Social History Narrative   Pt is from [facility]   Patient lives at home with her husband and son   Patient is right handed   Patient drinks coffee daily   Social Drivers of Corporate Investment Banker Strain: Not on file  Food Insecurity: No Food Insecurity (07/06/2023)   Hunger Vital Sign    Worried About Running Out of Food in the Last Year: Never true    Ran Out of Food in the Last Year: Never true  Transportation Needs: No Transportation Needs (07/06/2023)   PRAPARE - Administrator, Civil Service (Medical): No    Lack of Transportation (Non-Medical): No  Physical Activity: Inactive (07/05/2018)   Exercise Vital Sign  Days of Exercise per Week: 0 days    Minutes of Exercise per Session: 0 min  Stress: Stress Concern Present (07/05/2018)   [employer] of Occupational Health - Occupational Stress Questionnaire    Feeling of Stress : To some extent  Social Connections: Socially Integrated (07/05/2018)   Social Connection and Isolation Panel [NHANES]    Frequency of Communication with Friends and Family: More than three times a week    Frequency of Social Gatherings with Friends and Family: Twice a week    Attends Religious Services: 1 to 4 times per year    Active Member of [employer] or Organizations: Yes    Attends Banker Meetings: 1 to 4 times per year    Marital Status: Married  Catering Manager Violence: Not At Risk (07/06/2023)   Humiliation, Afraid, Rape, and Kick questionnaire    Fear of Current or Ex-Partner: No    Emotionally Abused: No    Physically Abused: No    Sexually Abused: No    Ms. Keisler's family history includes Cancer in her paternal grandmother; Diabetes in her maternal grandmother and  mother; Heart attack in her brother; Heart disease in her father, maternal grandmother, and mother; Hyperlipidemia in her father, maternal grandmother, and mother; Hypertension in her father and mother; Kidney failure in her mother; Liver disease in her son; Skin cancer in her sister; Varicose Veins in her sister.      Objective:    Vitals:   01/04/24 0951  BP: 110/72  Pulse: 73    Physical Exam Well-developed well-nourished older WF in no acute distress.  Height, Weight,125  BMI 22.14  HEENT; nontraumatic normocephalic, EOMI, PE R LA, sclera anicteric. Oropharynx; not examined today Neck; supple, no JVD bilateral carotid endarterectomy scars Cardiovascular; regular rate and rhythm with S1-S2, no murmur rub or gallop Pulmonary; Clear bilaterally Abdomen; soft, nontender, nondistended, no palpable mass or hepatosplenomegaly, bowel sounds are active Rectal; done today Skin; benign exam, no jaundice rash or appreciable lesions Extremities; no clubbing cyanosis or edema skin warm and dry Neuro/Psych; alert and oriented x4, grossly nonfocal mood and affect appropriate        Assessment & Plan:   #81 58 year old white female with history of grade B esophagitis, esophageal candidiasis, and esophageal biopsies showing Barrett's esophagus at EGD May 2024. Plan was for 34-month repeat EGD to confirm healing and take further biopsies.  Patient completed a course of Diflucan , she had been on omeprazole  40 mg daily but says she ran out within the past month or so and is not currently on a PPI.  Currently asymptomatic  #2 adenomatous and sessile serrated polyps-up-to-date with colonoscopy last done May 2024 and indicated for 7-year interval follow-up 3 chronic antiplatelet therapy on Plavix  and aspirin  #4 COPD no oxygen use 5.  Peripheral arterial disease status post femorofemoral bypass remotely and status post repair of femoral artery pseudoaneurysm July 2024 #6 status post bilateral carotid  endarterectomy's 7.  History of CVA/TIA 2015 8.  Diabetes mellitus X line #9 hypertension  Plan; Have refilled omeprazole  40 mg p.o. every morning AC breakfast,for one year, and discussed that she will benefit from chronic PPI given finding of Barrett's esophagus. Patient will be scheduled for EGD and biopsies with Dr. Shila  Procedure was discussed in detail with the patient including indications risks and benefits and she is agreeable to proceed. She will need to hold Plavix  for 5 days prior to the procedure, we will communicate with her vascular surgeon Dr.  Lonni Gaskins to assure this is reasonable for this patient, she can stay on baby aspirin .  Marlaysia Lenig GORMAN Corti PA-C 01/04/2024   Cc: Silvano Angeline FALCON, NP

## 2024-01-04 NOTE — Patient Instructions (Addendum)
 You have been scheduled for an endoscopy. Please follow written instructions given to you at your visit today.  If you use inhalers (even only as needed), please bring them with you on the day of your procedure.  If you take any of the following medications, they will need to be adjusted prior to your procedure:   DO NOT TAKE 7 DAYS PRIOR TO TEST- Trulicity (dulaglutide) Ozempic, Wegovy (semaglutide) Mounjaro (tirzepatide) Bydureon Bcise (exanatide extended release)  DO NOT TAKE 1 DAY PRIOR TO YOUR TEST Rybelsus (semaglutide) Adlyxin (lixisenatide) Victoza (liraglutide) Byetta (exanatide)  You will be contacted by our office prior to your procedure for directions on holding your Plavix .  If you do not hear from our office 1 week prior to your scheduled procedure, please call [phone] to discuss.    We have sent the following medications to your pharmacy for you to pick up at your convenience: Omeprazole   If your blood pressure at your visit was 140/90 or greater, please contact your primary care physician to follow up on this.  _______________________________________________________  If you are age 13 or older, your body mass index should be between 23-30. Your Body mass index is 22.14 kg/m. If this is out of the aforementioned range listed, please consider follow up with your Primary Care Provider.  If you are age 81 or younger, your body mass index should be between 19-25. Your Body mass index is 22.14 kg/m. If this is out of the aformentioned range listed, please consider follow up with your Primary Care Provider.   ________________________________________________________  The [facility] providers would like to encourage you to use MYCHART to communicate with providers for non-urgent requests or questions.  Due to long hold times on the telephone, sending your provider a message by [facility] may be a faster and more efficient way to get a response.  Please allow 48 business  hours for a response.  Please remember that this is for non-urgent requests.  _______________________________________________________  Due to recent changes in healthcare laws, you may see the results of your imaging and laboratory studies on MyChart before your provider has had a chance to review them.  We understand that in some cases there may be results that are confusing or concerning to you. Not all laboratory results come back in the same time frame and the provider may be waiting for multiple results in order to interpret others.  Please give us  48 hours in order for your provider to thoroughly review all the results before contacting the office for clarification of your results.   Thank you for entrusting me with your care and choosing [facility].  Amy Esterwood PA-C

## 2024-01-05 NOTE — Telephone Encounter (Signed)
 Spoke to patient received via epic from cardiologist okay to hold Plavix 5 days prior to procedure.

## 2024-01-27 ENCOUNTER — Ambulatory Visit: Payer: Medicare PPO | Admitting: Gastroenterology

## 2024-01-27 ENCOUNTER — Encounter: Payer: Self-pay | Admitting: Gastroenterology

## 2024-01-27 VITALS — BP 101/49 | HR 59 | Temp 97.8°F | Resp 16 | Ht 63.0 in | Wt 125.0 lb

## 2024-01-27 DIAGNOSIS — K2970 Gastritis, unspecified, without bleeding: Secondary | ICD-10-CM

## 2024-01-27 DIAGNOSIS — K219 Gastro-esophageal reflux disease without esophagitis: Secondary | ICD-10-CM | POA: Diagnosis not present

## 2024-01-27 DIAGNOSIS — K3189 Other diseases of stomach and duodenum: Secondary | ICD-10-CM | POA: Diagnosis not present

## 2024-01-27 DIAGNOSIS — K2270 Barrett's esophagus without dysplasia: Secondary | ICD-10-CM

## 2024-01-27 DIAGNOSIS — K319 Disease of stomach and duodenum, unspecified: Secondary | ICD-10-CM | POA: Diagnosis not present

## 2024-01-27 MED ORDER — PANTOPRAZOLE SODIUM 40 MG PO TBEC: 40.0000 mg | DELAYED_RELEASE_TABLET | Freq: Every day | ORAL | 3 refills | Status: AC

## 2024-01-27 MED ORDER — SODIUM CHLORIDE 0.9 % IV SOLN: 500.0000 mL | Freq: Once | INTRAVENOUS | Status: AC

## 2024-01-27 NOTE — Progress Notes (Signed)
 Called to room to assist during endoscopic procedure.  Patient ID and intended procedure confirmed with present staff. Received instructions for my participation in the procedure from the performing physician.

## 2024-01-27 NOTE — Progress Notes (Signed)
 Vss nad trans to pacu

## 2024-01-27 NOTE — Patient Instructions (Addendum)
-   Await pathology results. - Follow an antireflux regimen. - Resume Plavix  (clopidogrel ) at prior dose tomorrow. - No ibuprofen, naproxen, or other non-steroidal anti-inflammatory drugs. - DC Omeprazole  - Pantoprazole  40mg  daily before breakfast. Rx for 90 days with 3 refills   YOU HAD AN ENDOSCOPIC PROCEDURE TODAY AT THE [facility]:   Refer to the procedure report that was given to you for any specific questions about what was found during the examination.  If the procedure report does not answer your questions, please call your gastroenterologist to clarify.  If you requested that your care partner not be given the details of your procedure findings, then the procedure report has been included in a sealed envelope for you to review at your convenience later.  YOU SHOULD EXPECT: Some feelings of bloating in the abdomen. Passage of more gas than usual.  Walking can help get rid of the air that was put into your GI tract during the procedure and reduce the bloating. If you had a lower endoscopy (such as a colonoscopy or flexible sigmoidoscopy) you may notice spotting of blood in your stool or on the toilet paper. If you underwent a bowel prep for your procedure, you may not have a normal bowel movement for a few days.  Please Note:  You might notice some irritation and congestion in your nose or some drainage.  This is from the oxygen used during your procedure.  There is no need for concern and it should clear up in a day or so.  SYMPTOMS TO REPORT IMMEDIATELY: Following upper endoscopy (EGD)  Vomiting of blood or coffee ground material  New chest pain or pain under the shoulder blades  Painful or persistently difficult swallowing  New shortness of breath  Fever of 100F or higher  Black, tarry-looking stools  For urgent or emergent issues, a gastroenterologist can be reached at any hour by calling [phone]. Do not use MyChart messaging for urgent concerns.    DIET:  We  do recommend a small meal at first, but then you may proceed to your regular diet.  Drink plenty of fluids but you should avoid alcoholic beverages for 24 hours.  ACTIVITY:  You should plan to take it easy for the rest of today and you should NOT DRIVE or use heavy machinery until tomorrow (because of the sedation medicines used during the test).    FOLLOW UP: Our staff will call the number listed on your records the next business day following your procedure.  We will call around 7:15- 8:00 am to check on you and address any questions or concerns that you may have regarding the information given to you following your procedure. If we do not reach you, we will leave a message.     If any biopsies were taken you will be contacted by phone or by letter within the next 1-3 weeks.  Please call us  at [phone] if you have not heard about the biopsies in 3 weeks.    SIGNATURES/CONFIDENTIALITY: You and/or your care partner have signed paperwork which will be entered into your electronic medical record.  These signatures attest to the fact that that the information above on your After Visit Summary has been reviewed and is understood.  Full responsibility of the confidentiality of this discharge information lies with you and/or your care-partner.

## 2024-01-27 NOTE — Progress Notes (Signed)
 Pt's states no medical or surgical changes since previsit or office visit.

## 2024-01-27 NOTE — Op Note (Addendum)
 [facility] Patient Name: Andrea [name] Procedure Date: 01/27/2024 11:28 AM MRN: [id number] Endoscopist: Gustav ALONSO Mcgee , MD, [id number] Age: 58 Referring MD:  Date of Birth: 08-02-1966 Gender: Female Account #: [id number] Procedure:                Upper GI endoscopy Indications:              Follow-up of reflux esophagitis, Follow-up of                            Barrett's esophagus, Follow-up of candidal                            esophagitis Procedure:                Pre-Anesthesia Assessment:                           - Prior to the procedure, a History and Physical                            was performed, and patient medications and                            allergies were reviewed. The patient's tolerance of                            previous anesthesia was also reviewed. The risks                            and benefits of the procedure and the sedation                            options and risks were discussed with the patient.                            All questions were answered, and informed consent                            was obtained. Prior Anticoagulants: The patient                            last took Plavix  (clopidogrel ) 5 days prior to the                            procedure. ASA Grade Assessment: III - A patient                            with severe systemic disease. After reviewing the                            risks and benefits, the patient was deemed in                            satisfactory condition to undergo the procedure.  After obtaining informed consent, the endoscope was                            passed under direct vision. Throughout the                            procedure, the patient's blood pressure, pulse, and                            oxygen saturations were monitored continuously. The                            Olympus Scope [id number] was introduced through the                             mouth, and advanced to the second part of duodenum.                            The upper GI endoscopy was accomplished without                            difficulty. The patient tolerated the procedure                            well. Scope In: Scope Out: Findings:                 There were esophageal mucosal changes suggestive of                            short-segment Barrett's esophagus present in the                            lower third of the esophagus. The maximum                            longitudinal extent of these mucosal changes was 2                            cm in length. Mucosa was biopsied with a cold                            forceps for histology in the lower third of the                            esophagus. One specimen bottle was sent to                            pathology.                           Patchy moderate inflammation characterized by                            congestion (edema), erosions, erythema, friability,  aphthous ulcerations and shallow ulcerations was                            found in the gastric antrum and in the prepyloric                            region of the stomach. Biopsies were taken with a                            cold forceps for Helicobacter pylori testing.                           The cardia and gastric fundus were normal on                            retroflexion.                           The examined duodenum was normal. Complications:            No immediate complications. Estimated Blood Loss:     Estimated blood loss was minimal. Impression:               - Esophageal mucosal changes suggestive of                            short-segment Barrett's esophagus. Biopsied.                           Cadida and erosive esophagitis resolved                           - Gastritis. Biopsied.                           - Normal examined duodenum. Recommendation:           - Resume previous diet.                            - Continue present medications.                           - Await pathology results.                           - Follow an antireflux regimen.                           - Resume Plavix  (clopidogrel ) at prior dose                            tomorrow.                           - No ibuprofen, naproxen, or other non-steroidal  anti-inflammatory drugs.                           - DC Omeprazole                            - Pantoprazole  40mg  daily before breakfast. Rx for                            90 days with 3 refills                           - Repeat upper endoscopy in 3 - 5 years for                            surveillance of Barrett's esophagus based on                            pathology report. Algenis Ballin V. Malanie Koloski, MD 01/27/2024 12:04:23 PM This report has been signed electronically.

## 2024-01-27 NOTE — Progress Notes (Signed)
 [facility] History and Physical   Primary Care Physician:  Silvano Angeline FALCON, NP   Reason for Procedure:  Barrett's esophagus with erosive esophagitis  Plan:    EGD with possible interventions as needed     HPI: Andrea [name] is a very pleasant 58 y.o. female here for follow up of Barrett's esophagus with erosive esophagitis to document healing of esophagitis and obtain additional biopsies.   The risks and benefits as well as alternatives of endoscopic procedure(s) have been discussed and reviewed. All questions answered. The patient agrees to proceed.    Past Medical History:  Diagnosis Date   Allergy    Anemia    Anxiety    on Alprazolam    Arthritis    both hips   Asthma    Bilateral carotid artery stenosis 10/18/2014   Cancer (HCC)    skin cancer   Carotid artery occlusion    a. s/p prior endarterectomies with known RICA occlusion - followed by [facility].   Carotid stenosis 11/12/2011   S/p bilat CEA. Right 100% occluded, left 50-70% stenosed.     Cerebral infarction (HCC) 06/22/2014   IMO SNOMED Dx Update Oct 2024     COPD (chronic obstructive pulmonary disease) (HCC)    Depression    on Effexor    Diabetes mellitus with circulatory complication (HCC)    2011   Diastolic dysfunction without heart failure    DM (diabetes mellitus) with complications (HCC) 08/29/2014   GERD (gastroesophageal reflux disease)    Heart murmur    was seen by cardiologist in [facility] 2013   History of kidney stones    HLD (hyperlipidemia) 08/29/2014   HTN (hypertension), benign 08/29/2014   Hyperlipidemia    Hypertension    Mild aortic stenosis 04/29/2022   Occlusion and stenosis of carotid artery without mention of cerebral infarction 04/12/2014   PAD (peripheral artery disease) (HCC) 07/06/2023   Pseudoaneurysm of femoral artery (HCC) 06/16/2023   Pseudoaneurysm of left femoral artery (HCC) 07/06/2023   PVD (peripheral vascular disease) (HCC)    a. s/p prior LE  bypass grafting and revision (followed by [facility]).   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 possible mini stroke in 2015 - was told she did not have full stroke.    Past Surgical History:  Procedure Laterality Date   2 stents     ABDOMINAL AORTOGRAM W/LOWER EXTREMITY N/A 01/20/2019   Procedure: ABDOMINAL AORTOGRAM W/LOWER EXTREMITY;  Surgeon: Gretta Lonni PARAS, MD;  Location: [facility];  Service: Cardiovascular;  Laterality: N/A;   CAROTID ENDARTERECTOMY     x 2   CESAREAN SECTION     CHOLECYSTECTOMY     EXTRACORPOREAL SHOCK WAVE LITHOTRIPSY Left 03/29/2018   Procedure: LEFT EXTRACORPOREAL SHOCK WAVE LITHOTRIPSY (ESWL);  Surgeon: Matilda Senior, MD;  Location: [facility];  Service: Urology;  Laterality: Left;   FALSE ANEURYSM REPAIR Left 07/06/2023   Procedure: REPAIR OF LEFT FEMORAL PSEUDOANEURYSM, LEFT COMMON FEMORAL ENDARECTOMY, REDO EXPOSURE OF LEFT COMMON FEMORAL ARTERY GREATER THAN 30 DAYS, APPLICATION OF MYRIDAD POWDER, APPLICATION OF WOUND VAC SYSTEM;  Surgeon: Gretta Lonni PARAS, MD;  Location: [facility] OR;  Service: Vascular;  Laterality: Left;   FEMORAL BYPASS     x 2 on right side due to re-stenosis   FEMORAL-FEMORAL BYPASS GRAFT Bilateral 07/06/2023   Procedure: REVISION OF FEMORAL-FEMORAL ARTERY BYPASS WITH NEW INTERPOSITION;  Surgeon: Gretta Lonni PARAS, MD;  Location: [facility] OR;  Service: Vascular;  Laterality: Bilateral;   left/right carotid artery     2003 and 2009 for right, right side is 100% occluded per patient; left side 2009   TUBAL LIGATION      Prior to Admission medications   Medication Sig Start Date End Date Taking? Authorizing Provider  aspirin  81 MG tablet Take 1 tablet (81 mg total) by mouth daily. 04/27/17   Dunn, Dayna N, PA-C  cetirizine  (ZYRTEC ) 10 MG tablet Take 10 mg by mouth daily as needed for allergies.    [provider]  clopidogrel   (PLAVIX ) 75 MG tablet Take 1 tablet (75 mg total) by mouth daily. 11/10/18   Wiseman, Brittany D, PA-C  Evolocumab  (REPATHA  SURECLICK) 140 MG/ML SOAJ Inject 140 mg into the skin every 14 (fourteen) days. 11/04/23   Revankar, Jennifer SAUNDERS, MD  FARXIGA 10 MG TABS tablet Take 10 mg by mouth daily. 09/14/23   [provider]  fluticasone  (FLONASE ) 50 MCG/ACT nasal spray Place 2 sprays into both nostrils as needed for allergies or rhinitis.    [provider]  Fluticasone -Umeclidin-Vilant 200-62.5-25 MCG/ACT AEPB Inhale 1 puff into the lungs daily. 12/09/22   [provider]  Lancets MISC Test blood sugar once daily. 06/09/18   Starla Grate D, PA-C  lisinopril  (PRINIVIL ,ZESTRIL ) 2.5 MG tablet Take 1 tablet (2.5 mg total) by mouth daily. 11/10/18   Wiseman, Brittany D, PA-C  metFORMIN  (GLUCOPHAGE ) 1000 MG tablet Take 1 tablet (1,000 mg total) by mouth 2 (two) times daily with a meal. 11/10/18   Starla, Brittany D, PA-C  nitroGLYCERIN  (NITROSTAT ) 0.4 MG SL tablet Place 1 tablet (0.4 mg total) under the tongue every 5 (five) minutes as needed. 11/04/23 02/02/24  Revankar, Jennifer SAUNDERS, MD  omeprazole  (PRILOSEC) 40 MG capsule Take 1 capsule (40 mg total) by mouth daily. 01/04/24   Esterwood, Amy S, PA-C  oxyCODONE -acetaminophen  (PERCOCET) 5-325 MG tablet Take 1 tablet by mouth every 6 (six) hours as needed. Patient not taking: Reported on 01/04/2024 07/12/23   Rhyne, Samantha J, PA-C  simvastatin  (ZOCOR ) 40 MG tablet Take 40 mg by mouth daily. 07/24/23   [provider]  Venlafaxine  HCl 225 MG TB24 Take 1 tablet by mouth daily. 09/14/23   [provider]    Current Outpatient Medications  Medication Sig Dispense Refill   aspirin  81 MG tablet Take 1 tablet (81 mg total) by mouth daily. 30 tablet 11   cetirizine  (ZYRTEC ) 10 MG tablet Take 10 mg by mouth daily as needed for allergies.     clopidogrel  (PLAVIX ) 75 MG tablet Take 1 tablet (75 mg total) by mouth daily. 90 tablet 3    Evolocumab  (REPATHA  SURECLICK) 140 MG/ML SOAJ Inject 140 mg into the skin every 14 (fourteen) days. 6 mL 3   FARXIGA 10 MG TABS tablet Take 10 mg by mouth daily.     fluticasone  (FLONASE ) 50 MCG/ACT nasal spray Place 2 sprays into both nostrils as needed for allergies or rhinitis.     Fluticasone -Umeclidin-Vilant 200-62.5-25 MCG/ACT AEPB Inhale 1 puff into the lungs daily.     Lancets MISC Test blood sugar once daily. 100 each 2   lisinopril  (PRINIVIL ,ZESTRIL ) 2.5 MG tablet Take 1 tablet (2.5 mg total) by mouth daily. 90 tablet 3   metFORMIN  (GLUCOPHAGE ) 1000 MG tablet Take 1 tablet (1,000 mg total) by mouth 2 (two) times daily with a meal. 180 tablet 3   nitroGLYCERIN  (NITROSTAT ) 0.4 MG SL  tablet Place 1 tablet (0.4 mg total) under the tongue every 5 (five) minutes as needed. 25 tablet 6   omeprazole  (PRILOSEC) 40 MG capsule Take 1 capsule (40 mg total) by mouth daily. 90 capsule 4   oxyCODONE -acetaminophen  (PERCOCET) 5-325 MG tablet Take 1 tablet by mouth every 6 (six) hours as needed. (Patient not taking: Reported on 01/04/2024) 30 tablet 0   simvastatin  (ZOCOR ) 40 MG tablet Take 40 mg by mouth daily.     Venlafaxine  HCl 225 MG TB24 Take 1 tablet by mouth daily.     Current Facility-Administered Medications  Medication Dose Route Frequency Provider Last Rate Last Admin   0.9 %  sodium chloride  infusion  500 mL Intravenous Continuous Armbruster, Elspeth SQUIBB, MD       0.9 %  sodium chloride  infusion  500 mL Intravenous Once Aviana Shevlin V, MD        Allergies as of 01/27/2024 - Review Complete 01/27/2024  Allergen Reaction Noted   Atorvastatin   07/26/2018   Iohexol  Hives 04/13/2020   Other Hives 03/10/2014   Rosuvastatin  Nausea And Vomiting 07/26/2018    Family History  Problem Relation Age of Onset   Diabetes Mother    Heart disease Mother    Hyperlipidemia Mother    Hypertension Mother    Kidney failure Mother    Heart disease Father        MI, unknown age   Hyperlipidemia  Father    Hypertension Father    Skin cancer Sister    Varicose Veins Sister    Diabetes Maternal Grandmother    Heart disease Maternal Grandmother    Hyperlipidemia Maternal Grandmother    Cancer Paternal Grandmother        unknown type- possibly stomach   Heart attack Brother        Age 30   Liver disease Son        fatty liver   Colon cancer Neg Hx    Esophageal cancer Neg Hx    Breast cancer Neg Hx     Social History   Socioeconomic History   Marital status: Married    Spouse name: Not on file   Number of children: 1   Years of education: College   Highest education level: Not on file  Occupational History   Occupation: retired/disabled    Associate Professor: UNEMPLOYED  Tobacco Use   Smoking status: Every Day    Current packs/day: 1.00    Average packs/day: 1 pack/day for 40.0 years (40.0 ttl pk-yrs)    Types: Cigarettes   Smokeless tobacco: Never  Vaping Use   Vaping status: Never Used  Substance and Sexual Activity   Alcohol use: Yes    Alcohol/week: 0.0 standard drinks of alcohol    Comment: holidays/special occas 3-4 drinks   Drug use: No   Sexual activity: Not Currently    Partners: Male  Other Topics Concern   Not on file  Social History Narrative   Pt is from [facility]   Patient lives at home with her husband and son   Patient is right handed   Patient drinks coffee daily   Social Drivers of Corporate Investment Banker Strain: Not on file  Food Insecurity: No Food Insecurity (07/06/2023)   Hunger Vital Sign    Worried About Running Out of Food in the Last Year: Never true    Ran Out of Food in the Last Year: Never true  Transportation Needs: No Transportation Needs (07/06/2023)   PRAPARE -  Administrator, Civil Service (Medical): No    Lack of Transportation (Non-Medical): No  Physical Activity: Inactive (07/05/2018)   Exercise Vital Sign    Days of Exercise per Week: 0 days    Minutes of Exercise per Session: 0 min  Stress: Stress  Concern Present (07/05/2018)   [employer] of Occupational Health - Occupational Stress Questionnaire    Feeling of Stress : To some extent  Social Connections: Socially Integrated (07/05/2018)   Social Connection and Isolation Panel [NHANES]    Frequency of Communication with Friends and Family: More than three times a week    Frequency of Social Gatherings with Friends and Family: Twice a week    Attends Religious Services: 1 to 4 times per year    Active Member of [employer] or Organizations: Yes    Attends Banker Meetings: 1 to 4 times per year    Marital Status: Married  Catering Manager Violence: Not At Risk (07/06/2023)   Humiliation, Afraid, Rape, and Kick questionnaire    Fear of Current or Ex-Partner: No    Emotionally Abused: No    Physically Abused: No    Sexually Abused: No    Review of Systems:  All other review of systems negative except as mentioned in the HPI.  Physical Exam: Vital signs in last 24 hours: There were no vitals taken for this visit. General:   Alert, NAD Lungs:  Clear .   Heart:  Regular rate and rhythm Abdomen:  Soft, nontender and nondistended. Neuro/Psych:  Alert and cooperative. Normal mood and affect. A and O x 3  Reviewed labs, radiology imaging, old records and pertinent past GI work up  Patient is appropriate for planned procedure(s) and anesthesia in an ambulatory setting   K. Veena Orva Riles , MD [phone]

## 2024-01-28 ENCOUNTER — Telehealth: Payer: Self-pay

## 2024-01-28 NOTE — Telephone Encounter (Signed)
  Follow up Call-     01/27/2024   10:56 AM 05/20/2023   10:42 AM 05/19/2023    2:00 PM  Call back number  Post procedure Call Back phone  # [phone]  Permission to leave phone message Yes Yes Yes     Patient questions:  Do you have a fever, pain , or abdominal swelling? No. Pain Score  0 *  Have you tolerated food without any problems? Yes.    Have you been able to return to your normal activities? Yes.    Do you have any questions about your discharge instructions: Diet   No. Medications  No. Follow up visit  No.  Do you have questions or concerns about your Care? No.  Actions: * If pain score is 4 or above: No action needed, pain <4.

## 2024-01-29 LAB — SURGICAL PATHOLOGY

## 2024-03-09 ENCOUNTER — Encounter: Payer: Self-pay | Admitting: Gastroenterology

## 2024-04-12 ENCOUNTER — Telehealth: Payer: Self-pay

## 2024-04-12 ENCOUNTER — Other Ambulatory Visit (HOSPITAL_COMMUNITY): Payer: Self-pay

## 2024-04-12 ENCOUNTER — Telehealth: Payer: Self-pay | Admitting: Pharmacy Technician

## 2024-04-12 NOTE — Telephone Encounter (Signed)
 Pharmacy Patient Advocate Encounter  Received notification from [employer] that Prior Authorization for repatha  has been APPROVED from 12/23/23 to 12/21/24. Spoke to pharmacy to process.Copay is $80.00.    PA #/Case ID/Reference #: [id number]

## 2024-04-12 NOTE — Telephone Encounter (Signed)
 [facility] pharmacy requesting a PA for Repatha .

## 2024-09-22 ENCOUNTER — Telehealth: Payer: Self-pay

## 2024-09-22 NOTE — Telephone Encounter (Signed)
 Appt: -pt called requesting f/u appts that she never rebooked over 1 yr ago.  She became worried because her PCP noted bruit on her L carotid which she said was not there before advising her to do her f/u with Dr. Gretta about her legs and neck. -she states her carotid arteries have not been looked at in several years because her legs were the priority at the time. -she reports symptoms of occasional dizzy spells and occasional pain L side of head.  She reports having a stroke before and denies other stroke-like symptoms including slurred speech, weakness, blurred vision, balance or cognitive problems.   -she did not want her studies or appts to be booked before 10/14 d/t money and insisted on only seeing Dr. Gretta.  She has agreed to drive to [facility] for his earliest appt. -advised pt to call 911 for any symptoms mentioned above.  Assured pt that if she did go to the ER Dr. Gretta would be notified.

## 2024-09-30 ENCOUNTER — Other Ambulatory Visit: Payer: Self-pay | Admitting: *Deleted

## 2024-09-30 DIAGNOSIS — I724 Aneurysm of artery of lower extremity: Secondary | ICD-10-CM

## 2024-09-30 DIAGNOSIS — I6523 Occlusion and stenosis of bilateral carotid arteries: Secondary | ICD-10-CM

## 2024-09-30 DIAGNOSIS — Z95828 Presence of other vascular implants and grafts: Secondary | ICD-10-CM

## 2024-09-30 DIAGNOSIS — I739 Peripheral vascular disease, unspecified: Secondary | ICD-10-CM

## 2024-10-14 ENCOUNTER — Ambulatory Visit (HOSPITAL_COMMUNITY)
Admission: RE | Admit: 2024-10-14 | Discharge: 2024-10-14 | Disposition: A | Discharge status: Home / Self Care | Source: Ambulatory Visit | Attending: Vascular Surgery | Admitting: Vascular Surgery

## 2024-10-14 DIAGNOSIS — Z95828 Presence of other vascular implants and grafts: Secondary | ICD-10-CM | POA: Diagnosis present

## 2024-10-14 DIAGNOSIS — I6523 Occlusion and stenosis of bilateral carotid arteries: Secondary | ICD-10-CM | POA: Insufficient documentation

## 2024-10-14 DIAGNOSIS — I724 Aneurysm of artery of lower extremity: Secondary | ICD-10-CM | POA: Diagnosis present

## 2024-10-14 DIAGNOSIS — I739 Peripheral vascular disease, unspecified: Secondary | ICD-10-CM

## 2024-10-14 LAB — VAS US ABI WITH/WO TBI
Left ABI: 0.77
Right ABI: 0.55

## 2024-10-24 NOTE — Progress Notes (Unsigned)
 Patient name: Andrea [name] MRN: [id number] DOB: January 27, 1966 Sex: female  REASON FOR CONSULT: Surveillance femorofemoral bypass  HPI: Andrea [name] is a 58 y.o. female, with history of multiple medical issues including DM, HTN, HLD, tobacco abuse that presents for surveillance of her femoral-femoral bypass.    On 07/06/2023 she underwent revision of her right to left femorofemoral bypass with left femoral endarterectomy and repair of the left femoral pseudoaneurysm with dacron interposition.  Pseudoaneurysm was noted on surveillance imaging.   Patient initially underwent right to left femorofemoral bypass by Dr. Dyane in 2003 using 7 mm Dacron.  She had to have a revision later with bilateral femoral patch angioplasty in 2005. She has also undergone a right common iliac artery stent here by Dr. Dyane and another iliac stent at [facility].         Past Medical History:  Diagnosis Date   Allergy    Anemia    Anxiety    on Alprazolam    Arthritis    both hips   Asthma    Bilateral carotid artery stenosis 10/18/2014   Cancer (HCC)    skin cancer   Carotid artery occlusion    a. s/p prior endarterectomies with known RICA occlusion - followed by [facility].   Carotid stenosis 11/12/2011   S/p bilat CEA. Right 100% occluded, left 50-70% stenosed.     Cerebral infarction (HCC) 06/22/2014   IMO SNOMED Dx Update Oct 2024     COPD (chronic obstructive pulmonary disease) (HCC)    Depression    on Effexor    Diabetes mellitus with circulatory complication (HCC)    2011   Diastolic dysfunction without heart failure    DM (diabetes mellitus) with complications (HCC) 08/29/2014   GERD (gastroesophageal reflux disease)    Heart murmur    was seen by cardiologist in [facility] 2013   History of kidney stones    HLD (hyperlipidemia) 08/29/2014   HTN (hypertension), benign 08/29/2014   Hyperlipidemia    Hypertension    Mild aortic stenosis 04/29/2022   Occlusion and stenosis of carotid  artery without mention of cerebral infarction 04/12/2014   PAD (peripheral artery disease) 07/06/2023   Pseudoaneurysm of femoral artery 06/16/2023   Pseudoaneurysm of left femoral artery 07/06/2023   PVD (peripheral vascular disease)    a. s/p prior LE bypass grafting and revision (followed by [facility]).   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 possible mini stroke in 2015 - was told she did not have full stroke.    Past Surgical History:  Procedure Laterality Date   2 stents     ABDOMINAL AORTOGRAM W/LOWER EXTREMITY N/A 01/20/2019   Procedure: ABDOMINAL AORTOGRAM W/LOWER EXTREMITY;  Surgeon: Gretta Lonni PARAS, MD;  Location: [facility];  Service: Cardiovascular;  Laterality: N/A;   CAROTID ENDARTERECTOMY     x 2   CESAREAN SECTION     CHOLECYSTECTOMY     EXTRACORPOREAL SHOCK WAVE LITHOTRIPSY Left 03/29/2018   Procedure: LEFT EXTRACORPOREAL SHOCK WAVE LITHOTRIPSY (ESWL);  Surgeon: Matilda Senior, MD;  Location: [facility];  Service: Urology;  Laterality: Left;   FALSE ANEURYSM REPAIR Left 07/06/2023   Procedure: REPAIR OF LEFT FEMORAL PSEUDOANEURYSM, LEFT COMMON FEMORAL ENDARECTOMY, REDO EXPOSURE OF LEFT COMMON FEMORAL ARTERY GREATER THAN 30 DAYS, APPLICATION OF MYRIDAD POWDER, APPLICATION OF WOUND VAC SYSTEM;  Surgeon: Gretta Lonni PARAS, MD;  Location: [facility] OR;  Service: Vascular;  Laterality: Left;   FEMORAL BYPASS     x 2 on right side due to re-stenosis   FEMORAL-FEMORAL BYPASS GRAFT Bilateral 07/06/2023   Procedure: REVISION OF FEMORAL-FEMORAL ARTERY BYPASS WITH NEW INTERPOSITION;  Surgeon: Gretta Lonni PARAS, MD;  Location: [facility] OR;  Service: Vascular;  Laterality: Bilateral;   left/right carotid artery     2003 and 2009 for right, right side is 100% occluded per patient; left side 2009   TUBAL LIGATION      Family History  Problem Relation Age of Onset   Diabetes  Mother    Heart disease Mother    Hyperlipidemia Mother    Hypertension Mother    Kidney failure Mother    Heart disease Father        MI, unknown age   Hyperlipidemia Father    Hypertension Father    Skin cancer Sister    Varicose Veins Sister    Diabetes Maternal Grandmother    Heart disease Maternal Grandmother    Hyperlipidemia Maternal Grandmother    Cancer Paternal Grandmother        unknown type- possibly stomach   Heart attack Brother        Age 40   Liver disease Son        fatty liver   Colon cancer Neg Hx    Esophageal cancer Neg Hx    Breast cancer Neg Hx     SOCIAL HISTORY: Social History   Socioeconomic History   Marital status: Married    Spouse name: Not on file   Number of children: 1   Years of education: College   Highest education level: Not on file  Occupational History   Occupation: retired/disabled    Associate Professor: UNEMPLOYED  Tobacco Use   Smoking status: Every Day    Current packs/day: 1.00    Average packs/day: 1 pack/day for 40.0 years (40.0 ttl pk-yrs)    Types: Cigarettes   Smokeless tobacco: Never  Vaping Use   Vaping status: Never Used  Substance and Sexual Activity   Alcohol use: Yes    Alcohol/week: 0.0 standard drinks of alcohol    Comment: holidays/special occas 3-4 drinks   Drug use: No   Sexual activity: Not Currently    Partners: Male  Other Topics Concern   Not on file  Social History Narrative   Pt is from [facility]   Patient lives at home with her husband and son   Patient is right handed   Patient drinks coffee daily   Social Drivers of Corporate Investment Banker Strain: Not on file  Food Insecurity: No Food Insecurity (07/06/2023)   Hunger Vital Sign    Worried About Running Out of Food in the Last Year: Never true    Ran Out of Food in the Last Year: Never true  Transportation Needs: No Transportation Needs (07/06/2023)   PRAPARE - Administrator, Civil Service (Medical): No    Lack of  Transportation (Non-Medical): No  Physical Activity: Inactive (07/05/2018)   Exercise Vital Sign    Days of Exercise per Week: 0 days    Minutes of Exercise per Session: 0 min  Stress: Stress Concern Present (07/05/2018)   [employer] of Occupational Health - Occupational Stress Questionnaire    Feeling of Stress : To some extent  Social Connections: Socially Integrated (07/05/2018)   Social Connection and Isolation Panel    Frequency of Communication with Friends and  Family: More than three times a week    Frequency of Social Gatherings with Friends and Family: Twice a week    Attends Religious Services: 1 to 4 times per year    Active Member of [employer] or Organizations: Yes    Attends Banker Meetings: 1 to 4 times per year    Marital Status: Married  Catering Manager Violence: Not At Risk (07/06/2023)   Humiliation, Afraid, Rape, and Kick questionnaire    Fear of Current or Ex-Partner: No    Emotionally Abused: No    Physically Abused: No    Sexually Abused: No    Allergies  Allergen Reactions   Rosuvastatin  Nausea And Vomiting   Atorvastatin  Other (See Comments)    Muscle cramps   Iohexol  Hives    Patient should take 13 hours prep    Other Hives    Contrast dye     Current Outpatient Medications  Medication Sig Dispense Refill   aspirin  81 MG tablet Take 1 tablet (81 mg total) by mouth daily. 30 tablet 11   cetirizine  (ZYRTEC ) 10 MG tablet Take 10 mg by mouth daily as needed for allergies.     clopidogrel  (PLAVIX ) 75 MG tablet Take 1 tablet (75 mg total) by mouth daily. 90 tablet 3   Evolocumab  (REPATHA  SURECLICK) 140 MG/ML SOAJ Inject 140 mg into the skin every 14 (fourteen) days. 6 mL 3   FARXIGA 10 MG TABS tablet Take 10 mg by mouth daily.     fluticasone  (FLONASE ) 50 MCG/ACT nasal spray Place 2 sprays into both nostrils as needed for allergies or rhinitis.     Fluticasone -Umeclidin-Vilant 200-62.5-25 MCG/ACT AEPB Inhale 1 puff into the lungs daily.      Lancets MISC Test blood sugar once daily. 100 each 2   lisinopril  (PRINIVIL ,ZESTRIL ) 2.5 MG tablet Take 1 tablet (2.5 mg total) by mouth daily. 90 tablet 3   metFORMIN  (GLUCOPHAGE ) 1000 MG tablet Take 1 tablet (1,000 mg total) by mouth 2 (two) times daily with a meal. 180 tablet 3   nitroGLYCERIN  (NITROSTAT ) 0.4 MG SL tablet Place 1 tablet (0.4 mg total) under the tongue every 5 (five) minutes as needed. 25 tablet 6   omeprazole  (PRILOSEC) 40 MG capsule Take 1 capsule (40 mg total) by mouth daily. 90 capsule 4   oxyCODONE -acetaminophen  (PERCOCET) 5-325 MG tablet Take 1 tablet by mouth every 6 (six) hours as needed. (Patient not taking: Reported on 11/04/2023) 30 tablet 0   pantoprazole  (PROTONIX ) 40 MG tablet Take 1 tablet (40 mg total) by mouth daily. 90 tablet 3   simvastatin  (ZOCOR ) 40 MG tablet Take 40 mg by mouth daily.     Venlafaxine  HCl 225 MG TB24 Take 1 tablet by mouth daily.     Current Facility-Administered Medications  Medication Dose Route Frequency Provider Last Rate Last Admin   0.9 %  sodium chloride  infusion  500 mL Intravenous Continuous Armbruster, Elspeth SQUIBB, MD       0.9 %  sodium chloride  infusion  500 mL Intravenous Once Nandigam, Kavitha V, MD        REVIEW OF SYSTEMS:  [X]  denotes positive finding, [ ]  denotes negative finding Cardiac  Comments:  Chest pain or chest pressure:    Shortness of breath upon exertion:    Short of breath when lying flat:    Irregular heart rhythm:        Vascular    Pain in calf, thigh, or hip brought on by ambulation:  Pain in feet at night that wakes you up from your sleep:     Blood clot in your veins:    Leg swelling:         Pulmonary    Oxygen at home:    Productive cough:     Wheezing:         Neurologic    Sudden weakness in arms or legs:     Sudden numbness in arms or legs:     Sudden onset of difficulty speaking or slurred speech:    Temporary loss of vision in one eye:     Problems with dizziness:          Gastrointestinal    Blood in stool:     Vomited blood:         Genitourinary    Burning when urinating:     Blood in urine:        Psychiatric    Major depression:         Hematologic    Bleeding problems:    Problems with blood clotting too easily:        Skin    Rashes or ulcers:        Constitutional    Fever or chills:      PHYSICAL EXAM: There were no vitals filed for this visit.   GENERAL: The patient is a well-nourished female, in no acute distress. The vital signs are documented above. CARDIAC: There is a regular rate and rhythm.  VASCULAR:  Palpable pulse in fem fem bypass graft Left femoral pulse palpable Left DP palpable   DATA:   Femorofemoral duplex 10/14/2024 shows patent right to left graft  ABI 10/14/24 are 0.55 on the right monophasic and 0.77 on the left multiphasic  Carotid duplex 10/14/2024 shows occluded right carotid with 60 to 79% left ICA stenosis    Assessment/Plan:  58 y.o. female, with history of multiple medical issues including DM, HTN, HLD, tobacco abuse that presents for surveillance of her femoral-femoral bypass.    On 07/06/2023 she underwent revision of her right to left femorofemoral bypass with left femoral endarterectomy and repair of the left femoral pseudoaneurysm with dacron interposition.  Pseudoaneurysm was noted on surveillance imaging. Patient initially underwent right to left femorofemoral bypass by Dr. Dyane in 2003 using 7 mm Dacron.  She had to have a revision later with bilateral femoral patch angioplasty in 2005. She has also undergone a right common iliac artery stent here by Dr. Dyane and another iliac stent at [facility].      Bypass remains patent on duplex today.  ABIs on the left are improved to 0.77.  Will continue follow-up with surveillance imaging in 6 months.  Carotid duplex shows known right ICA occlusion with 60 to 70% left ICA stenosis.  No indication for intervention on the left until she has greater than  80% stenosis given asymptomatic.  Lonni DOROTHA Gaskins, MD [facility] Office: [phone]  [name]

## 2024-10-25 ENCOUNTER — Encounter: Payer: Self-pay | Admitting: Vascular Surgery

## 2024-10-25 ENCOUNTER — Ambulatory Visit: Admitting: Vascular Surgery

## 2024-10-25 VITALS — BP 158/75 | HR 78 | Temp 98.3°F | Resp 18 | Ht 63.0 in | Wt 126.4 lb

## 2024-10-25 DIAGNOSIS — I724 Aneurysm of artery of lower extremity: Secondary | ICD-10-CM

## 2024-10-25 DIAGNOSIS — I739 Peripheral vascular disease, unspecified: Secondary | ICD-10-CM | POA: Diagnosis not present

## 2024-10-25 DIAGNOSIS — I6523 Occlusion and stenosis of bilateral carotid arteries: Secondary | ICD-10-CM | POA: Diagnosis not present

## 2024-10-31 ENCOUNTER — Other Ambulatory Visit: Payer: Self-pay

## 2024-10-31 DIAGNOSIS — I6523 Occlusion and stenosis of bilateral carotid arteries: Secondary | ICD-10-CM

## 2024-10-31 DIAGNOSIS — Z91041 Radiographic dye allergy status: Secondary | ICD-10-CM

## 2024-10-31 MED ORDER — PREDNISONE 50 MG PO TABS: ORAL_TABLET | ORAL | 0 refills | Status: DC

## 2024-10-31 MED ORDER — DIPHENHYDRAMINE HCL 50 MG PO CAPS: ORAL_CAPSULE | ORAL | 0 refills | Status: DC

## 2024-11-25 ENCOUNTER — Other Ambulatory Visit: Payer: Self-pay | Admitting: Family

## 2024-11-25 DIAGNOSIS — Z1231 Encounter for screening mammogram for malignant neoplasm of breast: Secondary | ICD-10-CM

## 2024-11-28 ENCOUNTER — Ambulatory Visit (HOSPITAL_BASED_OUTPATIENT_CLINIC_OR_DEPARTMENT_OTHER)
Admission: RE | Admit: 2024-11-28 | Discharge: 2024-11-28 | Disposition: A | Source: Ambulatory Visit | Attending: Vascular Surgery

## 2024-11-28 DIAGNOSIS — I6523 Occlusion and stenosis of bilateral carotid arteries: Secondary | ICD-10-CM

## 2024-11-28 LAB — POCT I-STAT CREATININE: Creatinine, Ser: 0.6 mg/dL (ref 0.44–1.00)

## 2024-11-28 MED ORDER — IOHEXOL 350 MG/ML SOLN
75.0000 mL | Freq: Once | INTRAVENOUS | Status: AC | PRN
  Administered 2024-11-28: 75 mL via INTRAVENOUS

## 2024-11-29 ENCOUNTER — Inpatient Hospital Stay: Admission: RE | Admit: 2024-11-29 | Discharge: 2024-11-29 | Payer: Medicare PPO | Attending: Family | Admitting: Family

## 2024-11-29 DIAGNOSIS — Z1231 Encounter for screening mammogram for malignant neoplasm of breast: Secondary | ICD-10-CM

## 2024-12-06 ENCOUNTER — Encounter: Payer: Self-pay | Admitting: Vascular Surgery

## 2024-12-06 ENCOUNTER — Ambulatory Visit: Attending: Vascular Surgery | Admitting: Vascular Surgery

## 2024-12-06 DIAGNOSIS — I6523 Occlusion and stenosis of bilateral carotid arteries: Secondary | ICD-10-CM

## 2024-12-06 MED ORDER — CLOPIDOGREL BISULFATE 75 MG PO TABS: 75.0000 mg | ORAL_TABLET | Freq: Every day | ORAL | 4 refills | Status: AC

## 2024-12-06 NOTE — Progress Notes (Signed)
 Patient name: Andrea [name] MRN: [id number] DOB: [birth year] Sex: female  REASON FOR CONSULT: Follow-up after CTA neck for evaluation of carotid artery disease  HPI: Andrea [name] is a 58 y.o. female, with history of multiple medical issues including DM, HTN, HLD, tobacco abuse that presents for follow-up after CTA neck for evaluation of her carotid artery disease.  Seen with increasing dizziness over the last several months and has a known right ICA occlusion in the setting of bilateral carotid endarterectomies years ago.  Her left carotid endarterectomy was done 2009.  Last duplex suggested significant increasing stenosis.  She denies any recent stroke or TIA symptoms.  Also known to us  for prior fem fem bypass. On 07/06/2023 she underwent revision of her right to left femorofemoral bypass with left femoral endarterectomy and repair of the left femoral pseudoaneurysm with dacron interposition.  Pseudoaneurysm was noted on surveillance imaging. Patient initially underwent right to left femorofemoral bypass by Dr. Dyane in 2003 using 7 mm Dacron.  She had to have a revision later with bilateral femoral patch angioplasty in 2005. She has also undergone a right common iliac artery stent here by Dr. Dyane and another iliac stent at [facility].          Past Medical History:  Diagnosis Date   Allergy    Anemia    Anxiety    on Alprazolam    Arthritis    both hips   Asthma    Bilateral carotid artery stenosis 10/18/2014   Cancer (HCC)    skin cancer   Carotid artery occlusion    a. s/p prior endarterectomies with known RICA occlusion - followed by [facility].   Carotid stenosis 11/12/2011   S/p bilat CEA. Right 100% occluded, left 50-70% stenosed.     Cerebral infarction (HCC) 06/22/2014   IMO SNOMED Dx Update Oct 2024     COPD (chronic obstructive pulmonary disease) (HCC)    Depression    on Effexor    Diabetes mellitus with circulatory complication (HCC)    2011   Diastolic dysfunction  without heart failure    DM (diabetes mellitus) with complications (HCC) 08/29/2014   GERD (gastroesophageal reflux disease)    Heart murmur    was seen by cardiologist in [facility] 2013   History of kidney stones    HLD (hyperlipidemia) 08/29/2014   HTN (hypertension), benign 08/29/2014   Hyperlipidemia    Hypertension    Mild aortic stenosis 04/29/2022   Occlusion and stenosis of carotid artery without mention of cerebral infarction 04/12/2014   PAD (peripheral artery disease) 07/06/2023   Pseudoaneurysm of femoral artery 06/16/2023   Pseudoaneurysm of left femoral artery 07/06/2023   PVD (peripheral vascular disease)    a. s/p prior LE bypass grafting and revision (followed by [facility]).   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 possible mini stroke in 2015 - was told she did not have full stroke.    Past Surgical History:  Procedure Laterality Date   2 stents     ABDOMINAL AORTOGRAM W/LOWER EXTREMITY N/A 01/20/2019   Procedure: ABDOMINAL AORTOGRAM W/LOWER EXTREMITY;  Surgeon: Gretta Lonni PARAS, MD;  Location: [facility];  Service: Cardiovascular;  Laterality: N/A;   CAROTID ENDARTERECTOMY     x 2   CESAREAN SECTION     CHOLECYSTECTOMY     EXTRACORPOREAL SHOCK WAVE LITHOTRIPSY Left 03/29/2018  Procedure: LEFT EXTRACORPOREAL SHOCK WAVE LITHOTRIPSY (ESWL);  Surgeon: Matilda Senior, MD;  Location: [facility];  Service: Urology;  Laterality: Left;   FALSE ANEURYSM REPAIR Left 07/06/2023   Procedure: REPAIR OF LEFT FEMORAL PSEUDOANEURYSM, LEFT COMMON FEMORAL ENDARECTOMY, REDO EXPOSURE OF LEFT COMMON FEMORAL ARTERY GREATER THAN 30 DAYS, APPLICATION OF MYRIDAD POWDER, APPLICATION OF WOUND VAC SYSTEM;  Surgeon: Gretta Lonni PARAS, MD;  Location: [facility] OR;  Service: Vascular;  Laterality: Left;   FEMORAL BYPASS     x 2 on right side due to re-stenosis   FEMORAL-FEMORAL BYPASS  GRAFT Bilateral 07/06/2023   Procedure: REVISION OF FEMORAL-FEMORAL ARTERY BYPASS WITH NEW INTERPOSITION;  Surgeon: Gretta Lonni PARAS, MD;  Location: [facility] OR;  Service: Vascular;  Laterality: Bilateral;   left/right carotid artery     2003 and 2009 for right, right side is 100% occluded per patient; left side 2009   TUBAL LIGATION      Family History  Problem Relation Age of Onset   Diabetes Mother    Heart disease Mother    Hyperlipidemia Mother    Hypertension Mother    Kidney failure Mother    Heart disease Father        MI, unknown age   Hyperlipidemia Father    Hypertension Father    Skin cancer Sister    Varicose Veins Sister    Diabetes Maternal Grandmother    Heart disease Maternal Grandmother    Hyperlipidemia Maternal Grandmother    Cancer Paternal Grandmother        unknown type- possibly stomach   Heart attack Brother        Age 24   Liver disease Son        fatty liver   Colon cancer Neg Hx    Esophageal cancer Neg Hx    Breast cancer Neg Hx     SOCIAL HISTORY: Social History   Socioeconomic History   Marital status: Married    Spouse name: Not on file   Number of children: 1   Years of education: College   Highest education level: Not on file  Occupational History   Occupation: retired/disabled    Associate Professor: UNEMPLOYED  Tobacco Use   Smoking status: Every Day    Current packs/day: 1.00    Average packs/day: 1 pack/day for 40.0 years (40.0 ttl pk-yrs)    Types: Cigarettes   Smokeless tobacco: Never  Vaping Use   Vaping status: Never Used  Substance and Sexual Activity   Alcohol use: Yes    Alcohol/week: 0.0 standard drinks of alcohol    Comment: holidays/special occas 3-4 drinks   Drug use: No   Sexual activity: Not Currently    Partners: Male  Other Topics Concern   Not on file  Social History Narrative   Pt is from [facility]   Patient lives at home with her husband and son   Patient is right handed   Patient drinks coffee daily    Social Drivers of Health   Tobacco Use: High Risk (12/06/2024)   Patient History    Smoking Tobacco Use: Every Day    Smokeless Tobacco Use: Never    Passive Exposure: Not on file  Financial Resource Strain: Not on file  Food Insecurity: No Food Insecurity (07/06/2023)   Hunger Vital Sign    Worried About Running Out of Food in the Last Year: Never true    Ran Out of Food in the Last Year: Never true  Transportation Needs: No Transportation Needs (  07/06/2023)   PRAPARE - Administrator, Civil Service (Medical): No    Lack of Transportation (Non-Medical): No  Physical Activity: Not on file  Stress: Not on file  Social Connections: Not on file  Intimate Partner Violence: Not At Risk (07/06/2023)   Humiliation, Afraid, Rape, and Kick questionnaire    Fear of Current or Ex-Partner: No    Emotionally Abused: No    Physically Abused: No    Sexually Abused: No  Depression (PHQ2-9): Not on file  Alcohol Screen: Not on file  Housing: Low Risk (07/06/2023)   Housing    Last Housing Risk Score: 0  Utilities: At Risk (07/06/2023)   AHC Utilities    Threatened with loss of utilities: Yes  Health Literacy: Not on file    Allergies  Allergen Reactions   Rosuvastatin  Nausea And Vomiting   Atorvastatin  Other (See Comments)    Muscle cramps   Iohexol  Hives    Patient should take 13 hours prep    Other Hives    Contrast dye     Current Outpatient Medications  Medication Sig Dispense Refill   aspirin  81 MG tablet Take 1 tablet (81 mg total) by mouth daily. 30 tablet 11   cetirizine  (ZYRTEC ) 10 MG tablet Take 10 mg by mouth daily as needed for allergies.     clopidogrel  (PLAVIX ) 75 MG tablet Take 1 tablet (75 mg total) by mouth daily. 90 tablet 3   diphenhydrAMINE  (BENADRYL ) 50 MG capsule Take one capsule 1 hour prior to scan. 1 capsule 0   Evolocumab  (REPATHA  SURECLICK) 140 MG/ML SOAJ Inject 140 mg into the skin every 14 (fourteen) days. 6 mL 3   FARXIGA 10 MG TABS tablet  Take 10 mg by mouth daily.     fluticasone  (FLONASE ) 50 MCG/ACT nasal spray Place 2 sprays into both nostrils as needed for allergies or rhinitis.     Fluticasone -Umeclidin-Vilant 200-62.5-25 MCG/ACT AEPB Inhale 1 puff into the lungs daily.     Lancets MISC Test blood sugar once daily. 100 each 2   lisinopril  (PRINIVIL ,ZESTRIL ) 2.5 MG tablet Take 1 tablet (2.5 mg total) by mouth daily. 90 tablet 3   metFORMIN  (GLUCOPHAGE ) 1000 MG tablet Take 1 tablet (1,000 mg total) by mouth 2 (two) times daily with a meal. 180 tablet 3   nitroGLYCERIN  (NITROSTAT ) 0.4 MG SL tablet Place 1 tablet (0.4 mg total) under the tongue every 5 (five) minutes as needed. 25 tablet 6   omeprazole  (PRILOSEC) 40 MG capsule Take 1 capsule (40 mg total) by mouth daily. 90 capsule 4   pantoprazole  (PROTONIX ) 40 MG tablet Take 1 tablet (40 mg total) by mouth daily. 90 tablet 3   predniSONE  (DELTASONE ) 50 MG tablet Take one tablet 13 hours, 7 hours, and 1 hour prior to scan. 3 tablet 0   simvastatin  (ZOCOR ) 40 MG tablet Take 40 mg by mouth daily.     Venlafaxine  HCl 225 MG TB24 Take 1 tablet by mouth daily.     Current Facility-Administered Medications  Medication Dose Route Frequency Provider Last Rate Last Admin   0.9 %  sodium chloride  infusion  500 mL Intravenous Continuous Armbruster, Elspeth SQUIBB, MD       0.9 %  sodium chloride  infusion  500 mL Intravenous Once Nandigam, Kavitha V, MD        REVIEW OF SYSTEMS:  [X]  denotes positive finding, [ ]  denotes negative finding Cardiac  Comments:  Chest pain or chest pressure:    Shortness  of breath upon exertion:    Short of breath when lying flat:    Irregular heart rhythm:        Vascular    Pain in calf, thigh, or hip brought on by ambulation:    Pain in feet at night that wakes you up from your sleep:     Blood clot in your veins:    Leg swelling:         Pulmonary    Oxygen at home:    Productive cough:     Wheezing:         Neurologic    Sudden weakness in arms  or legs:     Sudden numbness in arms or legs:     Sudden onset of difficulty speaking or slurred speech:    Temporary loss of vision in one eye:     Problems with dizziness:         Gastrointestinal    Blood in stool:     Vomited blood:         Genitourinary    Burning when urinating:     Blood in urine:        Psychiatric    Major depression:         Hematologic    Bleeding problems:    Problems with blood clotting too easily:        Skin    Rashes or ulcers:        Constitutional    Fever or chills:      PHYSICAL EXAM: Vitals:   12/06/24 1404 12/06/24 1406  BP: (!) 119/58 (!) 145/65  Pulse: 78   Resp: 16   Temp: 98.4 F (36.9 C)   TempSrc: Temporal   SpO2: 98%   Weight: 125 lb 11.2 oz (57 kg)   Height: 5' 3 (1.6 m)      GENERAL: The patient is a well-nourished female, in no acute distress. The vital signs are documented above. CARDIAC: There is a regular rate and rhythm.  VASCULAR:  Bilateral neck incisions NEURO: Grossly intact with no deficit.   DATA:    Carotid duplex 10/14/2024 shows occluded right carotid with 60 to 79% left ICA stenosis with left ICA to CCA ratio over 4.5 suggesting high grade stenosis with left ICA velocities 439/97.  Femorofemoral duplex 10/14/2024 shows patent right to left graft  ABI 10/14/24 are 0.55 on the right monophasic and 0.77 on the left multiphasic  Assessment/Plan:  58 y.o. female, with history of multiple medical issues including DM, HTN, HLD, tobacco abuse that presents for follow-up after CTA neck for evaluation of her carotid artery disease.  Seen with increasing dizziness over the last several months and has a known right ICA occlusion in the setting of bilateral carotid endarterectomies years ago.  Her left carotid endarterectomy was done 2009.  Last duplex suggest significant increasing stenosis in the left ICA.  I suspect after review of her CTA and looking at the ICA to CCA ratio on the left that she has  recurrent high-grade stenosis in setting of prior left CEA.  I have recommended left carotid revascularization for stroke risk reduction and also to help her dizziness as she is symptomatic from the lesion in regard.  Her right carotid is occluded chronically and no role for revascularization of the right carotid as we discussed.  I discussed given prior left carotid endarterectomy would recommend left TCAR with stenting using flow reversal for distal embolic protection.  I discussed 1%  stroke risk.  She needs to remain on aspirin  Plavix  statin.  Will get scheduled at her convenience.  Plavix  refilled today.  Again discussed this is for stroke risk reduction but I am also hopeful it may help with her dizziness.  Lonni DOROTHA Gaskins, MD [facility] Office: [phone]  [name]

## 2024-12-07 ENCOUNTER — Other Ambulatory Visit: Payer: Self-pay

## 2024-12-07 DIAGNOSIS — I6523 Occlusion and stenosis of bilateral carotid arteries: Secondary | ICD-10-CM

## 2024-12-28 NOTE — Pre-Procedure Instructions (Signed)
 Surgical Instructions   Your procedure is scheduled on January 02, 2025. Report to [facility] Main Entrance A at 1100 A.M., then check in with the Admitting office. Any questions or running late day of surgery: call [phone]  Questions prior to your surgery date: call [phone], Monday-Friday, 8am-4pm. If you experience any cold or flu symptoms such as cough, fever, chills, shortness of breath, etc. between now and your scheduled surgery, please notify us  at the above number.     Remember:  Do not eat or drink after midnight the night before your surgery. No gum, mints, or hard candy.      Take these medicines the morning of surgery with A SIP OF WATER: aspirin   clopidogrel  (PLAVIX )  Fluticasone -Umeclidin-Vilant 200-62.5-25 MCG/ACT AEPB  omeprazole  (PRILOSEC)  pantoprazole  (PROTONIX )  simvastatin  (ZOCOR )  Venlafaxine    May take these medicines IF NEEDED: cetirizine  (ZYRTEC )  fluticasone  (FLONASE ) 50 MCG/ACT nasal spray  nitroGLYCERIN  (NITROSTAT ) 0.4 MG SL tablet- please notify us  if you take this medication prior to the day of surgery [phone]  One week prior to surgery, STOP taking any  Aleve, Naproxen, Ibuprofen, Motrin, Advil, Goody's, BC's, all herbal medications, fish oil, and non-prescription vitamins.  WHAT DO I DO ABOUT MY DIABETES MEDICATION?   Do not take oral diabetes medicines (pills) the morning of surgery. DO NOT TAKE metformin  (Glucophage ) the day of surgery.   Please do not take Farxiga for 72 hours prior to surgery. Your last dose should be on 12/29/24    HOW TO MANAGE YOUR DIABETES BEFORE AND AFTER SURGERY  Why is it important to control my blood sugar before and after surgery? Improving blood sugar levels before and after surgery helps healing and can limit problems. A way of improving blood sugar control is eating a healthy diet by:  Eating less sugar and carbohydrates  Increasing activity/exercise  Talking with your doctor about  reaching your blood sugar goals High blood sugars (greater than 180 mg/dL) can raise your risk of infections and slow your recovery, so you will need to focus on controlling your diabetes during the weeks before surgery. Make sure that the doctor who takes care of your diabetes knows about your planned surgery including the date and location.  How do I manage my blood sugar before surgery? Check your blood sugar at least 4 times a day, starting 2 days before surgery, to make sure that the level is not too high or low.  Check your blood sugar the morning of your surgery when you wake up and every 2 hours until you get to the Short Stay unit.  If your blood sugar is less than 70 mg/dL, you will need to treat for low blood sugar: Do not take insulin . Treat a low blood sugar (less than 70 mg/dL) with  cup of clear juice (cranberry or apple), 4 glucose tablets, OR glucose gel. Recheck blood sugar in 15 minutes after treatment (to make sure it is greater than 70 mg/dL). If your blood sugar is not greater than 70 mg/dL on recheck, call [phone] for further instructions. Report your blood sugar to the short stay nurse when you get to Short Stay.  If you are admitted to the hospital after surgery: Your blood sugar will be checked by the staff and you will probably be given insulin  after surgery (instead of oral diabetes medicines) to make sure you have good blood sugar levels. The goal for blood sugar control after surgery is 80-180 mg/dL.  Do NOT Smoke (Tobacco/Vaping) for 24 hours prior to your procedure.  If you use a CPAP at night, you may bring your mask/headgear for your overnight stay.   You will be asked to remove any contacts, glasses, piercing's, hearing aid's, dentures/partials prior to surgery. Please bring cases for these items if needed.    Patients discharged the day of surgery will not be allowed to drive home, and someone needs to stay with them for 24  hours.  SURGICAL WAITING ROOM VISITATION Patients may have no more than 2 support people in the waiting area - these visitors may rotate.   Pre-op nurse will coordinate an appropriate time for 1 ADULT support person, who may not rotate, to accompany patient in pre-op.  Children under the age of 29 must have an adult with them who is not the patient and must remain in the main waiting area with an adult.  If the patient needs to stay at the hospital during part of their recovery, the visitor guidelines for inpatient rooms apply.  Please refer to the [facility] website for the visitor guidelines for any additional information.   If you received a COVID test during your pre-op visit  it is requested that you wear a mask when out in public, stay away from anyone that may not be feeling well and notify your surgeon if you develop symptoms. If you have been in contact with anyone that has tested positive in the last 10 days please notify you surgeon.      Pre-operative CHG Bathing Instructions   You can play a key role in reducing the risk of infection after surgery. Your skin needs to be as free of germs as possible. You can reduce the number of germs on your skin by washing with CHG (chlorhexidine  gluconate) soap before surgery. CHG is an antiseptic soap that kills germs and continues to kill germs even after washing.   DO NOT use if you have an allergy to chlorhexidine /CHG or antibacterial soaps. If your skin becomes reddened or irritated, stop using the CHG and notify one of our RNs at [phone].              TAKE A SHOWER THE NIGHT BEFORE SURGERY   Please keep in mind the following:  DO NOT shave, including legs and underarms, 48 hours prior to surgery.   You may shave your face before/day of surgery.  Place clean sheets on your bed the night before surgery Use a clean washcloth (not used since being washed) for shower. DO NOT sleep with pet's night before surgery.  CHG Shower  Instructions:  Wash your face and private area with normal soap. If you choose to wash your hair, wash first with your normal shampoo.  After you use shampoo/soap, rinse your hair and body thoroughly to remove shampoo/soap residue.  Turn the water OFF and apply half the bottle of CHG soap to a CLEAN washcloth.  Apply CHG soap ONLY FROM YOUR NECK DOWN TO YOUR TOES (washing for 3-5 minutes)  DO NOT use CHG soap on face, private areas, open wounds, or sores.  Pay special attention to the area where your surgery is being performed.  If you are having back surgery, having someone wash your back for you may be helpful. Wait 2 minutes after CHG soap is applied, then you may rinse off the CHG soap.  Pat dry with a clean towel  Put on clean pajamas    Additional instructions for the day  of surgery: If you choose, you may shower the morning of surgery with an antibacterial soap.  DO NOT APPLY any lotions, deodorants, cologne, or perfumes.   Do not wear jewelry or makeup Do not wear nail polish, gel polish, artificial nails, or any other type of covering on natural nails (fingers and toes) Do not bring valuables to the hospital. [facility] is not responsible for valuables/personal belongings. Put on clean/comfortable clothes.  Please brush your teeth.  Ask your nurse before applying any prescription medications to the skin.

## 2024-12-29 ENCOUNTER — Encounter (HOSPITAL_COMMUNITY)
Admission: RE | Admit: 2024-12-29 | Discharge: 2024-12-29 | Disposition: A | Discharge status: Home / Self Care | Source: Ambulatory Visit | Attending: Vascular Surgery | Admitting: Vascular Surgery

## 2024-12-29 ENCOUNTER — Other Ambulatory Visit: Payer: Self-pay

## 2024-12-29 ENCOUNTER — Encounter (HOSPITAL_COMMUNITY): Payer: Self-pay

## 2024-12-29 VITALS — BP 176/70 | HR 67 | Temp 98.7°F | Resp 16 | Ht 63.0 in | Wt 125.0 lb

## 2024-12-29 DIAGNOSIS — Z8673 Personal history of transient ischemic attack (TIA), and cerebral infarction without residual deficits: Secondary | ICD-10-CM | POA: Diagnosis not present

## 2024-12-29 DIAGNOSIS — I6523 Occlusion and stenosis of bilateral carotid arteries: Secondary | ICD-10-CM | POA: Insufficient documentation

## 2024-12-29 DIAGNOSIS — F17200 Nicotine dependence, unspecified, uncomplicated: Secondary | ICD-10-CM | POA: Diagnosis not present

## 2024-12-29 DIAGNOSIS — I739 Peripheral vascular disease, unspecified: Secondary | ICD-10-CM | POA: Insufficient documentation

## 2024-12-29 DIAGNOSIS — Z01818 Encounter for other preprocedural examination: Secondary | ICD-10-CM | POA: Insufficient documentation

## 2024-12-29 DIAGNOSIS — J4489 Other specified chronic obstructive pulmonary disease: Secondary | ICD-10-CM | POA: Insufficient documentation

## 2024-12-29 DIAGNOSIS — K219 Gastro-esophageal reflux disease without esophagitis: Secondary | ICD-10-CM | POA: Diagnosis not present

## 2024-12-29 DIAGNOSIS — I10 Essential (primary) hypertension: Secondary | ICD-10-CM | POA: Diagnosis not present

## 2024-12-29 LAB — CBC
HCT: 47.3 % — ABNORMAL HIGH (ref 36.0–46.0)
Hemoglobin: 15.6 g/dL — ABNORMAL HIGH (ref 12.0–15.0)
MCH: 29.1 pg (ref 26.0–34.0)
MCHC: 33 g/dL (ref 30.0–36.0)
MCV: 88.2 fL (ref 80.0–100.0)
Platelets: 313 K/uL (ref 150–400)
RBC: 5.36 MIL/uL — ABNORMAL HIGH (ref 3.87–5.11)
RDW: 15.6 % — ABNORMAL HIGH (ref 11.5–15.5)
WBC: 8.1 K/uL (ref 4.0–10.5)
nRBC: 0 % (ref 0.0–0.2)

## 2024-12-29 LAB — PROTIME-INR
INR: 1 (ref 0.8–1.2)
Prothrombin Time: 13.6 s (ref 11.4–15.2)

## 2024-12-29 LAB — TYPE AND SCREEN
ABO/RH(D): O NEG
Antibody Screen: NEGATIVE

## 2024-12-29 LAB — COMPREHENSIVE METABOLIC PANEL WITH GFR
ALT: 5 U/L (ref 0–44)
AST: 17 U/L (ref 15–41)
Albumin: 4.4 g/dL (ref 3.5–5.0)
Alkaline Phosphatase: 113 U/L (ref 38–126)
Anion gap: 12 (ref 5–15)
BUN: 6 mg/dL (ref 6–20)
CO2: 26 mmol/L (ref 22–32)
Calcium: 9.6 mg/dL (ref 8.9–10.3)
Chloride: 100 mmol/L (ref 98–111)
Creatinine, Ser: 0.55 mg/dL (ref 0.44–1.00)
GFR, Estimated: >60 mL/min
Glucose, Bld: 91 mg/dL (ref 70–99)
Potassium: 4.3 mmol/L (ref 3.5–5.1)
Sodium: 138 mmol/L (ref 135–145)
Total Bilirubin: 0.3 mg/dL (ref 0.0–1.2)
Total Protein: 7.9 g/dL (ref 6.5–8.1)

## 2024-12-29 LAB — URINALYSIS, ROUTINE W REFLEX MICROSCOPIC
Bilirubin Urine: NEGATIVE
Glucose, UA: NEGATIVE mg/dL
Hgb urine dipstick: NEGATIVE
Ketones, ur: NEGATIVE mg/dL
Leukocytes,Ua: NEGATIVE
Nitrite: NEGATIVE
Protein, ur: NEGATIVE mg/dL
Specific Gravity, Urine: 1.013 (ref 1.005–1.030)
pH: 5 (ref 5.0–8.0)

## 2024-12-29 LAB — APTT: aPTT: 32 s (ref 24–36)

## 2024-12-29 LAB — GLUCOSE, CAPILLARY: Glucose-Capillary: 149 mg/dL — ABNORMAL HIGH (ref 70–99)

## 2024-12-29 LAB — SURGICAL PCR SCREEN
MRSA, PCR: NEGATIVE
Staphylococcus aureus: NEGATIVE

## 2024-12-29 NOTE — Progress Notes (Signed)
 Surgical Instructions     Your procedure is scheduled on January 02, 2025. Report to [facility] Main Entrance A at 1100 A.M., then check in with the Admitting office. Any questions or running late day of surgery: call [phone]   Questions prior to your surgery date: call [phone], Monday-Friday, 8am-4pm. If you experience any cold or flu symptoms such as cough, fever, chills, shortness of breath, etc. between now and your scheduled surgery, please notify us  at the above number.            Remember:       Do not eat or drink after midnight the night before your surgery. No gum, mints, or hard candy.             Take these medicines the morning of surgery with A SIP OF WATER: aspirin   clopidogrel  (PLAVIX )  Fluticasone -Umeclidin-Vilant 200-62.5-25 MCG/ACT AEPB  omeprazole  (PRILOSEC)  pantoprazole  (PROTONIX )  simvastatin  (ZOCOR )  Venlafaxine      May take these medicines IF NEEDED: cetirizine  (ZYRTEC )  fluticasone  (FLONASE ) 50 MCG/ACT nasal spray  nitroGLYCERIN  (NITROSTAT ) 0.4 MG SL tablet- please notify us  if you take this medication prior to the day of surgery [phone]   One week prior to surgery, STOP taking any  Aleve, Naproxen, Ibuprofen, Motrin, Advil, Goody's, BC's, all herbal medications, fish oil, and non-prescription vitamins.   WHAT DO I DO ABOUT MY DIABETES MEDICATION?     Do not take oral diabetes medicines (pills) the morning of surgery. DO NOT TAKE metformin  (Glucophage ) the day of surgery.       HOW TO MANAGE YOUR DIABETES BEFORE AND AFTER SURGERY   Why is it important to control my blood sugar before and after surgery? Improving blood sugar levels before and after surgery helps healing and can limit problems. A way of improving blood sugar control is eating a healthy diet by:  Eating less sugar and carbohydrates  Increasing activity/exercise  Talking with your doctor about reaching your blood sugar goals High blood sugars (greater than 180  mg/dL) can raise your risk of infections and slow your recovery, so you will need to focus on controlling your diabetes during the weeks before surgery. Make sure that the doctor who takes care of your diabetes knows about your planned surgery including the date and location.   How do I manage my blood sugar before surgery? Check your blood sugar at least 4 times a day, starting 2 days before surgery, to make sure that the level is not too high or low.   Check your blood sugar the morning of your surgery when you wake up and every 2 hours until you get to the Short Stay unit.   If your blood sugar is less than 70 mg/dL, you will need to treat for low blood sugar: Do not take insulin . Treat a low blood sugar (less than 70 mg/dL) with  cup of clear juice (cranberry or apple), 4 glucose tablets, OR glucose gel. Recheck blood sugar in 15 minutes after treatment (to make sure it is greater than 70 mg/dL). If your blood sugar is not greater than 70 mg/dL on recheck, call [phone] for further instructions. Report your blood sugar to the short stay nurse when you get to Short Stay.   If you are admitted to the hospital after surgery: Your blood sugar will be checked by the staff and you will probably be given insulin  after surgery (instead of oral diabetes medicines) to make sure you have good blood sugar levels.  The goal for blood sugar control after surgery is 80-180 mg/dL.                  Do NOT Smoke (Tobacco/Vaping) for 24 hours prior to your procedure.   If you use a CPAP at night, you may bring your mask/headgear for your overnight stay.   You will be asked to remove any contacts, glasses, piercing's, hearing aid's, dentures/partials prior to surgery. Please bring cases for these items if needed.    Patients discharged the day of surgery will not be allowed to drive home, and someone needs to stay with them for 24 hours.   SURGICAL WAITING ROOM VISITATION Patients may have no more  than 2 support people in the waiting area - these visitors may rotate.   Pre-op nurse will coordinate an appropriate time for 1 ADULT support person, who may not rotate, to accompany patient in pre-op.  Children under the age of 48 must have an adult with them who is not the patient and must remain in the main waiting area with an adult.   If the patient needs to stay at the hospital during part of their recovery, the visitor guidelines for inpatient rooms apply.   Please refer to the [facility] website for the visitor guidelines for any additional information.     If you received a COVID test during your pre-op visit  it is requested that you wear a mask when out in public, stay away from anyone that may not be feeling well and notify your surgeon if you develop symptoms. If you have been in contact with anyone that has tested positive in the last 10 days please notify you surgeon.         Pre-operative CHG Bathing Instructions    You can play a key role in reducing the risk of infection after surgery. Your skin needs to be as free of germs as possible. You can reduce the number of germs on your skin by washing with CHG (chlorhexidine  gluconate) soap before surgery. CHG is an antiseptic soap that kills germs and continues to kill germs even after washing.    DO NOT use if you have an allergy to chlorhexidine /CHG or antibacterial soaps. If your skin becomes reddened or irritated, stop using the CHG and notify one of our RNs at [phone].               TAKE A SHOWER THE NIGHT BEFORE SURGERY    Please keep in mind the following:  DO NOT shave, including legs and underarms, 48 hours prior to surgery.   You may shave your face before/day of surgery.  Place clean sheets on your bed the night before surgery Use a clean washcloth (not used since being washed) for shower. DO NOT sleep with pet's night before surgery.   CHG Shower Instructions:  Wash your face and private area with normal  soap. If you choose to wash your hair, wash first with your normal shampoo.  After you use shampoo/soap, rinse your hair and body thoroughly to remove shampoo/soap residue.  Turn the water OFF and apply half the bottle of CHG soap to a CLEAN washcloth.  Apply CHG soap ONLY FROM YOUR NECK DOWN TO YOUR TOES (washing for 3-5 minutes)  DO NOT use CHG soap on face, private areas, open wounds, or sores.  Pay special attention to the area where your surgery is being performed.  If you are having back surgery, having someone wash your  back for you may be helpful. Wait 2 minutes after CHG soap is applied, then you may rinse off the CHG soap.  Pat dry with a clean towel  Put on clean pajamas     Additional instructions for the day of surgery: If you choose, you may shower the morning of surgery with an antibacterial soap.  DO NOT APPLY any lotions, deodorants, cologne, or perfumes.   Do not wear jewelry or makeup Do not wear nail polish, gel polish, artificial nails, or any other type of covering on natural nails (fingers and toes) Do not bring valuables to the hospital. [facility] is not responsible for valuables/personal belongings. Put on clean/comfortable clothes.  Please brush your teeth.  Ask your nurse before applying any prescription medications to the skin.

## 2024-12-29 NOTE — Progress Notes (Signed)
 PCP - Dr. Angeline Iba  Cardiologist - Dr. Edwyna (for heart murmur) (LOV 11-04-2023) Patient does have referral for MD at [facility]  PPM/ICD - denies Device Orders - n/a Rep Notified - n/a  Chest x-ray - denies EKG - 12-27-24 Stress Test - 08-17-19 ECHO - 01-30-21 Cardiac Cath -   Sleep Study - denies CPAP - n/a  DM- per patient she checks blood sugar whenever she feels like Last A1c on 12-12-24 was 6.6 (CE) Blood sugar at PAT appointment 149  Blood Thinner Instructions:clopidogrel  (PLAVIX ) continue per instructions given from surgeon Aspirin  Instructions:continue continue per instructions from surgeon  ERAS Protcol - NPO PRE-SURGERY Ensure or G2-   COVID TEST- n/a   Anesthesia review: yes, Hx of heart murmur, DM, COPD, TIA. DOROTHA Hope PA into see patient at PAT appointment  Patient denies shortness of breath, fever, cough and chest pain at PAT appointment   All instructions explained to the patient, with a verbal understanding of the material. Patient agrees to go over the instructions while at home for a better understanding. Patient also instructed to self quarantine after being tested for COVID-19. The opportunity to ask questions was provided.

## 2024-12-30 NOTE — Anesthesia Preprocedure Evaluation (Signed)
 "                                  Anesthesia Evaluation  Patient identified by MRN, date of birth, ID band Patient awake    Reviewed: Allergy & Precautions, H&P , NPO status , Patient's Chart, lab work & pertinent test results  History of Anesthesia Complications Negative for: history of anesthetic complications  Airway Mallampati: II  TM Distance: >3 FB Neck ROM: Full    Dental no notable dental hx.    Pulmonary asthma , COPD, Current Smoker   Pulmonary exam normal breath sounds clear to auscultation       Cardiovascular hypertension, + Peripheral Vascular Disease  Normal cardiovascular exam+ Valvular Problems/Murmurs AS  Rhythm:Regular Rate:Normal  Carotid stenosis  Hx of mild aortic stenosis on 2022 TTE   Neuro/Psych  PSYCHIATRIC DISORDERS Anxiety Depression    TIACVA    GI/Hepatic Neg liver ROS,GERD  ,,  Endo/Other  diabetes, Type 2    Renal/GU negative Renal ROS  negative genitourinary   Musculoskeletal  (+) Arthritis ,    Abdominal   Peds negative pediatric ROS (+)  Hematology negative hematology ROS (+)   Anesthesia Other Findings   Reproductive/Obstetrics negative OB ROS                              Anesthesia Physical Anesthesia Plan  ASA: 3  Anesthesia Plan: General   Post-op Pain Management:    Induction: Intravenous  PONV Risk Score and Plan: 2 and Ondansetron , Dexamethasone , Treatment may vary due to age or medical condition and Midazolam   Airway Management Planned: Oral ETT  Additional Equipment: Arterial line  Intra-op Plan:   Post-operative Plan: Extubation in OR  Informed Consent: I have reviewed the patients History and Physical, chart, labs and discussed the procedure including the risks, benefits and alternatives for the proposed anesthesia with the patient or authorized representative who has indicated his/her understanding and acceptance.     Dental advisory given  Plan  Discussed with: CRNA  Anesthesia Plan Comments: (PAT note by Lynwood Hope, PA-C: 59 year old female with pertinent history including current smoker with associated COPD, asthma, GERD on PPI, TIA, HTN, aortic stenosis, extensive PAD.  Follows with vascular surgery for history of PAD.  She originally underwent left femorofemoral bypass in 2003 with revision bilateral femoral patch angioplasty 2005 and subsequent right common iliac artery stenting.  She had repair of left femoral pseudoaneurysm including left femoral endarterectomy and revision of her femoral-femoral bypass graft in 06/2023.  She also had remote bilateral carotid endarterectomies with subsequent CTO of the RICA and recently progressive stenosis of the LICA.  She was seen by Dr. Gretta on 12/06/2024 who recommended left TCAR for stroke risk reduction.  Nuclear stress test 07/2019 was low risk.  Echocardiogram 01/2021 showed LVEF 60 to 65%, normal RV systolic function, mild aortic valve stenosis with mean gradient 8.7 mmHg.  I spoke with patient during her preadmission testing visit due to history of very mild aortic stenosis in 2022.  She has never had this followed up.  She denies any symptoms of chest pain, presyncope, syncope.  She does have intermittent dizziness and has discussed this with Dr. Gretta.  It is unclear if this is at all related to her carotid stenosis.  She says she has not had any decline in her functional status and is  able to go up 2 flights of stairs without issue.  On exam she has a 3/6 systolic murmur which leads me to believe her aortic stenosis has likely progressed.  She is in the process of reestablishing with cardiology at [facility] to hopefully have this reevaluated in the near future.  I discussed her history with anesthesiologist Dr. Keneth.  Given good functional status and lack of symptoms, advised okay to proceed as planned.  Preop labs reviewed, unremarkable.  EKG 12/29/2024: NSR.  Rate 68.  Septal infarct,  age-indeterminate.  TTE 01/30/2021:  1. Left ventricular ejection fraction, by estimation, is 60 to 65%. The  left ventricle has normal function. The left ventricle has no regional  wall motion abnormalities. Left ventricular diastolic parameters were  normal. The average left ventricular  global longitudinal strain is -23.7 %. The global longitudinal strain is  normal.   2. Right ventricular systolic function is normal. The right ventricular  size is normal. There is normal pulmonary artery systolic pressure.   3. Left atrial size was mildly dilated.   4. The mitral valve is normal in structure. No evidence of mitral valve  regurgitation. No evidence of mitral stenosis.   5. The aortic valve is tricuspid. Aortic valve regurgitation is not  visualized. Mild aortic valve stenosis.   6. The inferior vena cava is normal in size with greater than 50%  respiratory variability, suggesting right atrial pressure of 3 mmHg.   Comparison(s): A prior study was performed on 05/11/2017. Prior images  reviewed side by side. Changes from prior study are noted. There is now  mild aortic stenosis.   Nuclear stress 08/17/2019:  Nuclear stress EF: 74%.  The left ventricular ejection fraction is hyperdynamic (>65%).  Inconclusive EKG due to resting ST depression.  Defect 1: There is a small fixed defect present in the apical anterior and apex location. Likely artifact as there is normal wall motion in this region  The study is likely normal.  This is a low risk study.   )         Anesthesia Quick Evaluation  "

## 2024-12-30 NOTE — Progress Notes (Signed)
 Anesthesia Chart Review:  59 year old female with pertinent history including current smoker with associated COPD, asthma, GERD on PPI, TIA, HTN, aortic stenosis, extensive PAD.  Follows with vascular surgery for history of PAD.  She originally underwent left femorofemoral bypass in 2003 with revision bilateral femoral patch angioplasty 2005 and subsequent right common iliac artery stenting.  She had repair of left femoral pseudoaneurysm including left femoral endarterectomy and revision of her femoral-femoral bypass graft in 06/2023.  She also had remote bilateral carotid endarterectomies with subsequent CTO of the RICA and recently progressive stenosis of the LICA.  She was seen by Dr. Gretta on 12/06/2024 who recommended left TCAR for stroke risk reduction.  Nuclear stress test 07/2019 was low risk.  Echocardiogram 01/2021 showed LVEF 60 to 65%, normal RV systolic function, mild aortic valve stenosis with mean gradient 8.7 mmHg.  I spoke with patient during her preadmission testing visit due to history of very mild aortic stenosis in 2022.  She has never had this followed up.  She denies any symptoms of chest pain, presyncope, syncope.  She does have intermittent dizziness and has discussed this with Dr. Gretta.  It is unclear if this is at all related to her carotid stenosis.  She says she has not had any decline in her functional status and is able to go up 2 flights of stairs without issue.  On exam she has a 3/6 systolic murmur which leads me to believe her aortic stenosis has likely progressed.  She is in the process of reestablishing with cardiology at [facility] to hopefully have this reevaluated in the near future.  I discussed her history with anesthesiologist Dr. Keneth.  Given good functional status and lack of symptoms, advised okay to proceed as planned.  Preop labs reviewed, unremarkable.  EKG 12/29/2024: NSR.  Rate 68.  Septal infarct, age-indeterminate.  TTE 01/30/2021:  1. Left ventricular  ejection fraction, by estimation, is 60 to 65%. The  left ventricle has normal function. The left ventricle has no regional  wall motion abnormalities. Left ventricular diastolic parameters were  normal. The average left ventricular  global longitudinal strain is -23.7 %. The global longitudinal strain is  normal.   2. Right ventricular systolic function is normal. The right ventricular  size is normal. There is normal pulmonary artery systolic pressure.   3. Left atrial size was mildly dilated.   4. The mitral valve is normal in structure. No evidence of mitral valve  regurgitation. No evidence of mitral stenosis.   5. The aortic valve is tricuspid. Aortic valve regurgitation is not  visualized. Mild aortic valve stenosis.   6. The inferior vena cava is normal in size with greater than 50%  respiratory variability, suggesting right atrial pressure of 3 mmHg.   Comparison(s): A prior study was performed on 05/11/2017. Prior images  reviewed side by side. Changes from prior study are noted. There is now  mild aortic stenosis.   Nuclear stress 08/17/2019: Nuclear stress EF: 74%. The left ventricular ejection fraction is hyperdynamic (>65%). Inconclusive EKG due to resting ST depression. Defect 1: There is a small fixed defect present in the apical anterior and apex location. Likely artifact as there is normal wall motion in this region The study is likely normal. This is a low risk study.     Lynwood Geofm RIGGERS [facility] Short Stay Center/Anesthesiology Phone [phone] 12/30/2024 1:15 PM

## 2025-01-02 ENCOUNTER — Inpatient Hospital Stay (HOSPITAL_COMMUNITY)
Admission: RE | Admit: 2025-01-02 | Discharge: 2025-01-03 | DRG: 036 | Disposition: A | Discharge status: Home / Self Care | Attending: Vascular Surgery | Admitting: Vascular Surgery

## 2025-01-02 ENCOUNTER — Other Ambulatory Visit: Payer: Self-pay

## 2025-01-02 ENCOUNTER — Encounter (HOSPITAL_COMMUNITY): Admission: RE | Disposition: A | Payer: Self-pay | Source: Home / Self Care | Attending: Vascular Surgery

## 2025-01-02 ENCOUNTER — Encounter (HOSPITAL_COMMUNITY): Payer: Self-pay | Admitting: Vascular Surgery

## 2025-01-02 ENCOUNTER — Inpatient Hospital Stay (HOSPITAL_COMMUNITY)

## 2025-01-02 ENCOUNTER — Encounter (HOSPITAL_COMMUNITY): Payer: Self-pay | Admitting: Anesthesiology

## 2025-01-02 ENCOUNTER — Inpatient Hospital Stay (HOSPITAL_COMMUNITY): Payer: Self-pay | Admitting: Anesthesiology

## 2025-01-02 DIAGNOSIS — Z006 Encounter for examination for normal comparison and control in clinical research program: Secondary | ICD-10-CM

## 2025-01-02 DIAGNOSIS — Z83438 Family history of other disorder of lipoprotein metabolism and other lipidemia: Secondary | ICD-10-CM | POA: Diagnosis not present

## 2025-01-02 DIAGNOSIS — Z8673 Personal history of transient ischemic attack (TIA), and cerebral infarction without residual deficits: Secondary | ICD-10-CM | POA: Diagnosis not present

## 2025-01-02 DIAGNOSIS — Z560 Unemployment, unspecified: Secondary | ICD-10-CM

## 2025-01-02 DIAGNOSIS — F17210 Nicotine dependence, cigarettes, uncomplicated: Secondary | ICD-10-CM | POA: Diagnosis present

## 2025-01-02 DIAGNOSIS — Z7984 Long term (current) use of oral hypoglycemic drugs: Secondary | ICD-10-CM | POA: Diagnosis not present

## 2025-01-02 DIAGNOSIS — Z91041 Radiographic dye allergy status: Secondary | ICD-10-CM

## 2025-01-02 DIAGNOSIS — I6522 Occlusion and stenosis of left carotid artery: Secondary | ICD-10-CM | POA: Diagnosis not present

## 2025-01-02 DIAGNOSIS — Z9049 Acquired absence of other specified parts of digestive tract: Secondary | ICD-10-CM | POA: Diagnosis not present

## 2025-01-02 DIAGNOSIS — Z8419 Family history of other disorders of kidney and ureter: Secondary | ICD-10-CM | POA: Diagnosis not present

## 2025-01-02 DIAGNOSIS — E785 Hyperlipidemia, unspecified: Secondary | ICD-10-CM | POA: Diagnosis present

## 2025-01-02 DIAGNOSIS — Z7902 Long term (current) use of antithrombotics/antiplatelets: Secondary | ICD-10-CM

## 2025-01-02 DIAGNOSIS — Z833 Family history of diabetes mellitus: Secondary | ICD-10-CM | POA: Diagnosis not present

## 2025-01-02 DIAGNOSIS — Z79899 Other long term (current) drug therapy: Secondary | ICD-10-CM

## 2025-01-02 DIAGNOSIS — K219 Gastro-esophageal reflux disease without esophagitis: Secondary | ICD-10-CM | POA: Diagnosis present

## 2025-01-02 DIAGNOSIS — Z87442 Personal history of urinary calculi: Secondary | ICD-10-CM

## 2025-01-02 DIAGNOSIS — J4489 Other specified chronic obstructive pulmonary disease: Secondary | ICD-10-CM | POA: Diagnosis present

## 2025-01-02 DIAGNOSIS — M160 Bilateral primary osteoarthritis of hip: Secondary | ICD-10-CM | POA: Diagnosis present

## 2025-01-02 DIAGNOSIS — Z808 Family history of malignant neoplasm of other organs or systems: Secondary | ICD-10-CM

## 2025-01-02 DIAGNOSIS — F32A Depression, unspecified: Secondary | ICD-10-CM | POA: Diagnosis present

## 2025-01-02 DIAGNOSIS — Z8249 Family history of ischemic heart disease and other diseases of the circulatory system: Secondary | ICD-10-CM

## 2025-01-02 DIAGNOSIS — Z5989 Other problems related to housing and economic circumstances: Secondary | ICD-10-CM

## 2025-01-02 DIAGNOSIS — I10 Essential (primary) hypertension: Secondary | ICD-10-CM | POA: Diagnosis present

## 2025-01-02 DIAGNOSIS — Z7982 Long term (current) use of aspirin: Secondary | ICD-10-CM

## 2025-01-02 DIAGNOSIS — F419 Anxiety disorder, unspecified: Secondary | ICD-10-CM | POA: Diagnosis present

## 2025-01-02 DIAGNOSIS — J449 Chronic obstructive pulmonary disease, unspecified: Secondary | ICD-10-CM

## 2025-01-02 DIAGNOSIS — Z888 Allergy status to other drugs, medicaments and biological substances status: Secondary | ICD-10-CM | POA: Diagnosis not present

## 2025-01-02 DIAGNOSIS — Z85828 Personal history of other malignant neoplasm of skin: Secondary | ICD-10-CM

## 2025-01-02 DIAGNOSIS — I6523 Occlusion and stenosis of bilateral carotid arteries: Secondary | ICD-10-CM | POA: Diagnosis present

## 2025-01-02 DIAGNOSIS — E1151 Type 2 diabetes mellitus with diabetic peripheral angiopathy without gangrene: Secondary | ICD-10-CM | POA: Diagnosis present

## 2025-01-02 DIAGNOSIS — Z7952 Long term (current) use of systemic steroids: Secondary | ICD-10-CM

## 2025-01-02 HISTORY — PX: TRANSCAROTID ARTERY REVASCULARIZATIONÂ: SHX6778

## 2025-01-02 LAB — GLUCOSE, CAPILLARY
Glucose-Capillary: 132 mg/dL — ABNORMAL HIGH (ref 70–99)
Glucose-Capillary: 130 mg/dL — ABNORMAL HIGH (ref 70–99)
Glucose-Capillary: 168 mg/dL — ABNORMAL HIGH (ref 70–99)
Glucose-Capillary: 189 mg/dL — ABNORMAL HIGH (ref 70–99)

## 2025-01-02 LAB — CBC
HCT: 40 % (ref 36.0–46.0)
Hemoglobin: 13.4 g/dL (ref 12.0–15.0)
MCH: 28.9 pg (ref 26.0–34.0)
MCHC: 33.5 g/dL (ref 30.0–36.0)
MCV: 86.4 fL (ref 80.0–100.0)
Platelets: 260 K/uL (ref 150–400)
RBC: 4.63 MIL/uL (ref 3.87–5.11)
RDW: 15.4 % (ref 11.5–15.5)
WBC: 11.1 K/uL — ABNORMAL HIGH (ref 4.0–10.5)
nRBC: 0 % (ref 0.0–0.2)

## 2025-01-02 LAB — CREATININE, SERUM
Creatinine, Ser: 0.59 mg/dL (ref 0.44–1.00)
GFR, Estimated: >60 mL/min

## 2025-01-02 MED ORDER — PHENYLEPHRINE 80 MCG/ML (10ML) SYRINGE FOR IV PUSH (FOR BLOOD PRESSURE SUPPORT)
PREFILLED_SYRINGE | INTRAVENOUS | Status: DC | PRN
  Administered 2025-01-02: 80 ug via INTRAVENOUS

## 2025-01-02 MED ORDER — SODIUM CHLORIDE 0.9% FLUSH
3.0000 mL | Freq: Two times a day (BID) | INTRAVENOUS | Status: DC
  Administered 2025-01-02 – 2025-01-03 (×2): 3 mL via INTRAVENOUS

## 2025-01-02 MED ORDER — VENLAFAXINE HCL ER 225 MG PO TB24: 225.0000 mg | ORAL_TABLET | Freq: Every day | ORAL | Status: DC

## 2025-01-02 MED ORDER — SODIUM CHLORIDE 0.9% FLUSH: 3.0000 mL | INTRAVENOUS | Status: DC | PRN

## 2025-01-02 MED ORDER — HEMOSTATIC AGENTS (NO CHARGE) OPTIME
TOPICAL | Status: DC | PRN
  Administered 2025-01-02: 1 via TOPICAL

## 2025-01-02 MED ORDER — OXYCODONE HCL 5 MG/5ML PO SOLN: 5.0000 mg | Freq: Once | ORAL | Status: DC | PRN

## 2025-01-02 MED ORDER — ASPIRIN 81 MG PO CHEW
81.0000 mg | CHEWABLE_TABLET | Freq: Every day | ORAL | Status: DC
  Administered 2025-01-03: 81 mg via ORAL
  Filled 2025-01-02 (×2): qty 1

## 2025-01-02 MED ORDER — HEPARIN SODIUM (PORCINE) 1000 UNIT/ML IJ SOLN
INTRAMUSCULAR | Status: DC | PRN
  Administered 2025-01-02: 6000 [IU] via INTRAVENOUS

## 2025-01-02 MED ORDER — OXYCODONE HCL 5 MG PO TABS: 5.0000 mg | ORAL_TABLET | ORAL | Status: DC | PRN

## 2025-01-02 MED ORDER — CHLORHEXIDINE GLUCONATE 0.12 % MT SOLN: 15.0000 mL | OROMUCOSAL | Status: AC

## 2025-01-02 MED ORDER — CHLORHEXIDINE GLUCONATE CLOTH 2 % EX PADS: 6.0000 | MEDICATED_PAD | Freq: Once | CUTANEOUS | Status: DC

## 2025-01-02 MED ORDER — DROPERIDOL 2.5 MG/ML IJ SOLN: 0.6250 mg | Freq: Once | INTRAMUSCULAR | Status: DC | PRN

## 2025-01-02 MED ORDER — SENNOSIDES-DOCUSATE SODIUM 8.6-50 MG PO TABS: 1.0000 | ORAL_TABLET | Freq: Every evening | ORAL | Status: DC | PRN

## 2025-01-02 MED ORDER — HYDRALAZINE HCL 20 MG/ML IJ SOLN: 5.0000 mg | INTRAMUSCULAR | Status: DC | PRN

## 2025-01-02 MED ORDER — CLOPIDOGREL BISULFATE 75 MG PO TABS
75.0000 mg | ORAL_TABLET | Freq: Every day | ORAL | Status: DC
  Administered 2025-01-03: 75 mg via ORAL
  Filled 2025-01-02: qty 1

## 2025-01-02 MED ORDER — ONDANSETRON HCL 4 MG/2ML IJ SOLN
INTRAMUSCULAR | Status: DC | PRN
  Administered 2025-01-02: 4 mg via INTRAVENOUS

## 2025-01-02 MED ORDER — SODIUM CHLORIDE 0.9 % IV SOLN: INTRAVENOUS | Status: DC

## 2025-01-02 MED ORDER — ROCURONIUM BROMIDE 10 MG/ML (PF) SYRINGE
PREFILLED_SYRINGE | INTRAVENOUS | Status: DC | PRN
  Administered 2025-01-02: 30 mg via INTRAVENOUS
  Administered 2025-01-02: 50 mg via INTRAVENOUS

## 2025-01-02 MED ORDER — HEPARIN SODIUM (PORCINE) 5000 UNIT/ML IJ SOLN
5000.0000 [IU] | Freq: Three times a day (TID) | INTRAMUSCULAR | Status: DC
  Administered 2025-01-03: 5000 [IU] via SUBCUTANEOUS
  Filled 2025-01-02: qty 1

## 2025-01-02 MED ORDER — FENTANYL CITRATE (PF) 250 MCG/5ML IJ SOLN
INTRAMUSCULAR | Status: AC
  Filled 2025-01-02: qty 5

## 2025-01-02 MED ORDER — ACETAMINOPHEN 325 MG PO TABS: 325.0000 mg | ORAL_TABLET | ORAL | Status: DC | PRN

## 2025-01-02 MED ORDER — MIDAZOLAM HCL 2 MG/2ML IJ SOLN
INTRAMUSCULAR | Status: AC
  Filled 2025-01-02: qty 2

## 2025-01-02 MED ORDER — PROPOFOL 10 MG/ML IV BOLUS
INTRAVENOUS | Status: DC | PRN
  Administered 2025-01-02: 150 mg via INTRAVENOUS

## 2025-01-02 MED ORDER — ACETAMINOPHEN 650 MG RE SUPP: 325.0000 mg | RECTAL | Status: DC | PRN

## 2025-01-02 MED ORDER — SUGAMMADEX SODIUM 200 MG/2ML IV SOLN
INTRAVENOUS | Status: DC | PRN
  Administered 2025-01-02: 200 mg via INTRAVENOUS

## 2025-01-02 MED ORDER — HEPARIN 6000 UNIT IRRIGATION SOLUTION
Status: AC
  Filled 2025-01-02: qty 500

## 2025-01-02 MED ORDER — LISINOPRIL 5 MG PO TABS
2.5000 mg | ORAL_TABLET | Freq: Every day | ORAL | Status: DC
  Administered 2025-01-03: 2.5 mg via ORAL
  Filled 2025-01-02: qty 1

## 2025-01-02 MED ORDER — PHENYLEPHRINE HCL-NACL 20-0.9 MG/250ML-% IV SOLN
INTRAVENOUS | Status: DC | PRN
  Administered 2025-01-02: 30 ug/min via INTRAVENOUS

## 2025-01-02 MED ORDER — CEFAZOLIN SODIUM-DEXTROSE 2-4 GM/100ML-% IV SOLN
INTRAVENOUS | Status: AC
  Filled 2025-01-02: qty 100

## 2025-01-02 MED ORDER — LACTATED RINGERS IV SOLN: INTRAVENOUS | Status: DC

## 2025-01-02 MED ORDER — CEFAZOLIN SODIUM-DEXTROSE 2-4 GM/100ML-% IV SOLN
2.0000 g | INTRAVENOUS | Status: AC
  Administered 2025-01-02: 2 g via INTRAVENOUS

## 2025-01-02 MED ORDER — DOCUSATE SODIUM 100 MG PO CAPS: 100.0000 mg | ORAL_CAPSULE | Freq: Every day | ORAL | Status: DC

## 2025-01-02 MED ORDER — PANTOPRAZOLE SODIUM 40 MG PO TBEC
40.0000 mg | DELAYED_RELEASE_TABLET | Freq: Every day | ORAL | Status: DC
  Administered 2025-01-03: 40 mg via ORAL
  Filled 2025-01-02: qty 1

## 2025-01-02 MED ORDER — ATORVASTATIN CALCIUM 40 MG PO TABS
40.0000 mg | ORAL_TABLET | Freq: Every day | ORAL | Status: DC
  Administered 2025-01-03: 40 mg via ORAL
  Filled 2025-01-02: qty 1

## 2025-01-02 MED ORDER — VENLAFAXINE HCL ER 75 MG PO CP24
225.0000 mg | ORAL_CAPSULE | Freq: Every day | ORAL | Status: DC
  Filled 2025-01-02: qty 3

## 2025-01-02 MED ORDER — CHLORHEXIDINE GLUCONATE 0.12 % MT SOLN
OROMUCOSAL | Status: AC
  Administered 2025-01-02: 15 mL via OROMUCOSAL
  Filled 2025-01-02: qty 15

## 2025-01-02 MED ORDER — PHENOL 1.4 % MT LIQD: 1.0000 | OROMUCOSAL | Status: DC | PRN

## 2025-01-02 MED ORDER — SODIUM CHLORIDE 0.9 % IV SOLN
0.1500 ug/kg/min | INTRAVENOUS | Status: AC
  Administered 2025-01-02: .1 ug/kg/min via INTRAVENOUS
  Filled 2025-01-02: qty 2000

## 2025-01-02 MED ORDER — FENTANYL CITRATE (PF) 250 MCG/5ML IJ SOLN
INTRAMUSCULAR | Status: DC | PRN
  Administered 2025-01-02 (×2): 50 ug via INTRAVENOUS

## 2025-01-02 MED ORDER — LIDOCAINE HCL (PF) 1 % IJ SOLN
INTRAMUSCULAR | Status: AC
  Filled 2025-01-02: qty 30

## 2025-01-02 MED ORDER — METOPROLOL TARTRATE 5 MG/5ML IV SOLN: 2.5000 mg | INTRAVENOUS | Status: DC | PRN

## 2025-01-02 MED ORDER — LABETALOL HCL 5 MG/ML IV SOLN: 10.0000 mg | INTRAVENOUS | Status: DC | PRN

## 2025-01-02 MED ORDER — ACETAMINOPHEN 10 MG/ML IV SOLN: 1000.0000 mg | Freq: Once | INTRAVENOUS | Status: DC | PRN

## 2025-01-02 MED ORDER — LACTATED RINGERS IV SOLN: INTRAVENOUS | Status: DC | PRN

## 2025-01-02 MED ORDER — HEPARIN 6000 UNIT IRRIGATION SOLUTION
Status: DC | PRN
  Administered 2025-01-02: 1

## 2025-01-02 MED ORDER — DIPHENHYDRAMINE HCL 50 MG/ML IJ SOLN
25.0000 mg | Freq: Once | INTRAMUSCULAR | Status: AC
  Administered 2025-01-02: 25 mg via INTRAVENOUS
  Filled 2025-01-02: qty 1

## 2025-01-02 MED ORDER — HYDROMORPHONE HCL 1 MG/ML IJ SOLN: 0.5000 mg | INTRAMUSCULAR | Status: DC | PRN

## 2025-01-02 MED ORDER — SODIUM CHLORIDE 0.9 % IV SOLN: 250.0000 mL | INTRAVENOUS | Status: DC | PRN

## 2025-01-02 MED ORDER — SODIUM CHLORIDE 0.9 % IV SOLN: 500.0000 mL | Freq: Once | INTRAVENOUS | Status: DC | PRN

## 2025-01-02 MED ORDER — POTASSIUM CHLORIDE CRYS ER 20 MEQ PO TBCR: 40.0000 meq | EXTENDED_RELEASE_TABLET | Freq: Every day | ORAL | Status: DC | PRN

## 2025-01-02 MED ORDER — METHYLPREDNISOLONE SODIUM SUCC 125 MG IJ SOLR
125.0000 mg | Freq: Once | INTRAMUSCULAR | Status: AC
  Administered 2025-01-02: 125 mg via INTRAVENOUS
  Filled 2025-01-02: qty 2

## 2025-01-02 MED ORDER — LIDOCAINE 2% (20 MG/ML) 5 ML SYRINGE
INTRAMUSCULAR | Status: DC | PRN
  Administered 2025-01-02: 80 mg via INTRAVENOUS

## 2025-01-02 MED ORDER — METFORMIN HCL 500 MG PO TABS
1000.0000 mg | ORAL_TABLET | Freq: Every day | ORAL | Status: DC
  Administered 2025-01-03: 1000 mg via ORAL
  Filled 2025-01-02: qty 2

## 2025-01-02 MED ORDER — FENTANYL CITRATE (PF) 100 MCG/2ML IJ SOLN: 25.0000 ug | INTRAMUSCULAR | Status: DC | PRN

## 2025-01-02 MED ORDER — IODIXANOL 320 MG/ML IV SOLN
INTRAVENOUS | Status: DC | PRN
  Administered 2025-01-02: 70.4 mL

## 2025-01-02 MED ORDER — LABETALOL HCL 5 MG/ML IV SOLN
INTRAVENOUS | Status: DC | PRN
  Administered 2025-01-02 (×3): 5 mg via INTRAVENOUS

## 2025-01-02 MED ORDER — CEFAZOLIN SODIUM-DEXTROSE 2-4 GM/100ML-% IV SOLN
2.0000 g | Freq: Three times a day (TID) | INTRAVENOUS | Status: AC
  Administered 2025-01-02 – 2025-01-03 (×2): 2 g via INTRAVENOUS
  Filled 2025-01-02 (×2): qty 100

## 2025-01-02 MED ORDER — ONDANSETRON HCL 4 MG/2ML IJ SOLN: 4.0000 mg | Freq: Four times a day (QID) | INTRAMUSCULAR | Status: DC | PRN

## 2025-01-02 MED ORDER — MIDAZOLAM HCL (PF) 2 MG/2ML IJ SOLN
INTRAMUSCULAR | Status: DC | PRN
  Administered 2025-01-02: 2 mg via INTRAVENOUS

## 2025-01-02 MED ORDER — BISACODYL 5 MG PO TBEC: 5.0000 mg | DELAYED_RELEASE_TABLET | Freq: Every day | ORAL | Status: DC | PRN

## 2025-01-02 MED ORDER — OXYCODONE HCL 5 MG PO TABS: 5.0000 mg | ORAL_TABLET | Freq: Once | ORAL | Status: DC | PRN

## 2025-01-02 MED ORDER — PROTAMINE SULFATE 10 MG/ML IV SOLN
INTRAVENOUS | Status: DC | PRN
  Administered 2025-01-02: 50 mg via INTRAVENOUS

## 2025-01-02 MED ORDER — INSULIN ASPART 100 UNIT/ML IJ SOLN: 0.0000 [IU] | INTRAMUSCULAR | Status: DC | PRN

## 2025-01-02 MED ORDER — PROPOFOL 10 MG/ML IV BOLUS
INTRAVENOUS | Status: AC
  Filled 2025-01-02: qty 20

## 2025-01-02 MED ORDER — GLYCOPYRROLATE PF 0.2 MG/ML IJ SOSY
PREFILLED_SYRINGE | INTRAMUSCULAR | Status: DC | PRN
  Administered 2025-01-02: .2 mg via INTRAVENOUS

## 2025-01-02 NOTE — Anesthesia Procedure Notes (Signed)
 Arterial Line Insertion Start/End1/11/2025 12:45 PM Performed by: Claudene Florina Boga, CRNA, CRNA  Patient location: Pre-op. Preanesthetic checklist: patient identified, IV checked, site marked, risks and benefits discussed, surgical consent, monitors and equipment checked, pre-op evaluation, timeout performed and anesthesia consent Lidocaine  1% used for infiltration Right, radial was placed Catheter size: 20 G Hand hygiene performed  and maximum sterile barriers used   Attempts: 1 Following insertion, dressing applied and Biopatch. Post procedure assessment: normal and unchanged  Patient tolerated the procedure well with no immediate complications.

## 2025-01-02 NOTE — Op Note (Addendum)
 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  Ultrasound-guided access left common femoral vein for delivery of TCAR venous sheath 2.  Transcatheter placement of cervical carotid artery stent with angioplasty and flow reversal for distal embolic protection (left TCAR)  Surgeon: Dr. Lonni DOROTHA Gaskins, MD  Assistant: Curry Damme, PA  Indications: 59 year old female with multiple risk factors seen with evidence of recurrent high-grade left internal carotid artery stenosis over 80%.  This is in setting of prior left carotid endarterectomy and also known right carotid occlusion.  Also reporting increasing dizziness.  Assistant was needed given the complexity of the case and also for cutdown on the common carotid artery and wire catheter and sheath exchange.  Findings: Ultrasound-guided access left common femoral vein for delivery of TCAR venous sheath.  Transverse incision above the left clavicle with cutdown on the common carotid artery for percutaneous access.  Once we were on active flow reversal the lesion was crossed in the left internal carotid artery that had over 80% stenosis as demonstrated on CT imaging.  This was treated with a 6 mm x 35 mm angioplasty balloon and stented with a 10 mm x 40 mm Enroute stent.  Widely patent stent at completion.  Anesthesia: General  Details: Patient was taken to the operating room after informed consent was obtained.  Placed on the operative table in the supine position.  General endotracheal anesthesia was induced.  Ultimately I marked the carotid above the left clavicle and then the left neck and bilateral groins were prepped and draped in standard sterile fashion.  Timeout was performed and antibiotics were given.  Initially started in the left groin evaluated the common femoral vein, it was  patent and image was saved.  This was accessed with micro access needle, placed a microwire, and a micro sheath and then I exchanged for a Bentson wire and tried to place the TCAR venous sheath.  She has a lot of scar tissue in her groin from prior femorofemoral bypass with multiple revisions.  Ultimately had to use an Amplatz wire and predilate this with several dilators to finally get the TCAR venous sheath in place and this flushed and aspirated easily.  Then turned our attention to the left neck where a transverse incision was made one fingerbreadth above the clavicle.  We divided the platysma with Bovie cautery and patient was given 100 units/kg IV heparin .  ACT was checked and maintained greater than 250.  I then went and divided between the heads of the sternocleidomastoid dissected out the internal jugular vein that was mobilized laterally in the wound.  The vagus nerve was visualized and protected.  The common carotid was dissected out with Metzenbaum scissors.  This was controlled with a large umbilical tape and vessel loop.  I then placed a pursestring on the anterior wall the artery with a 5-0 Prolene.  This was then accessed in the pursestring with a micro access needle placed a microwire and a micro sheath and then got a left carotid angiogram to identify the carotid bifurcation.  We then used a J-wire upsized to the working arterial sheath in the left common carotid artery.  We then secured the arterial sheath with multiple 3-0 silk sutures.  The filter was connected to the sheath in the groin for passive flow reversal.  We then got several working images in order to identify the  high-grade 80% stenosis in the internal carotid artery.  We then went on active clamp for active flow reversal.  The lesion was then crossed with our wire and then treated with angioplasty balloon with a 6 mm x 35 mm angioplasty balloon and then we exchanged and stented this with a 10 mm x 40 mm Enroute stent in the proximal  internal carotid artery into the distal common carotid artery with the help of my assistant.  We allowed 2 minutes of flow reversal.  Final imaging showed widely patent carotid stent with good filling of the vessel distally.  Ultimately we came off clamp.  The arterial sheath was removed from the common carotid artery tied down the pursestring with good hemostasis.  [place] with pencil Doppler and had good flow in the carotid artery.  Protamine  was given for reversal.  The neck was irrigated out we used Surgicel snow for hemostasis.  This was closed with 3-0 Vicryl 4-0 Monocryl and Dermabond.  The femoral sheath was removed and we held manual pressure for 2 minutes with good hemostasis.  Awakened neurologically intact and taken to recovery.  Complication: None  Condition: Stable  Lonni DOROTHA Gaskins, MD [facility] Office: [phone]   [name]

## 2025-01-02 NOTE — Discharge Instructions (Signed)

## 2025-01-02 NOTE — Anesthesia Postprocedure Evaluation (Signed)
"   Anesthesia Post Note  Patient: Andrea [name]  Procedure(s) Performed: TRANSCAROTID ARTERY REVASCULARIZATION (TCAR) (Left)     Patient location during evaluation: PACU Anesthesia Type: General Level of consciousness: awake and alert Pain management: pain level controlled Vital Signs Assessment: post-procedure vital signs reviewed and stable Respiratory status: spontaneous breathing, nonlabored ventilation, respiratory function stable and patient connected to nasal cannula oxygen Cardiovascular status: blood pressure returned to baseline and stable Postop Assessment: no apparent nausea or vomiting Anesthetic complications: no   No notable events documented.  Last Vitals:  Vitals:   01/02/25 1730 01/02/25 1745  BP: (!) 136/56 (!) 138/52  Pulse: 70 68  Resp: 20 19  Temp:    SpO2: 95% 95%    Last Pain:  Vitals:   01/02/25 1730  TempSrc:   PainSc: 0-No pain                 Thom JONELLE Peoples      "

## 2025-01-02 NOTE — Anesthesia Procedure Notes (Signed)
 Procedure Name: Intubation Date/Time: 01/02/2025 2:01 PM  Performed by: Marva Lonni PARAS, CRNAPre-anesthesia Checklist: Patient identified, Emergency Drugs available, Suction available and Patient being monitored Patient Re-evaluated:Patient Re-evaluated prior to induction Oxygen Delivery Method: Circle System Utilized Preoxygenation: Pre-oxygenation with 100% oxygen Induction Type: IV induction Ventilation: Mask ventilation without difficulty Laryngoscope Size: Mac and 3 Grade View: Grade I Tube type: Oral Tube size: 7.0 mm Number of attempts: 1 Airway Equipment and Method: Stylet Placement Confirmation: ETT inserted through vocal cords under direct vision, positive ETCO2 and breath sounds checked- equal and bilateral Secured at: 21 cm Tube secured with: Tape Dental Injury: Teeth and Oropharynx as per pre-operative assessment

## 2025-01-02 NOTE — H&P (Signed)
 History and Physical Interval Note:  01/02/2025 12:58 PM  [name]  has presented today for surgery, with the diagnosis of BL carotid stenosis.  The various methods of treatment have been discussed with the patient and family. After consideration of risks, benefits and other options for treatment, the patient has consented to  Procedures: TRANSCAROTID ARTERY REVASCULARIZATION (TCAR) (Left) as a surgical intervention.  The patient's history has been reviewed, patient examined, no change in status, stable for surgery.  I have reviewed the patient's chart and labs.  Questions were answered to the patient's satisfaction.    Left TCAR  [name]     Patient name: Andrea [name]     MRN: [id number]        DOB: [birth year]          Sex: female   REASON FOR CONSULT: Follow-up after CTA neck for evaluation of carotid artery disease   HPI: [name] is a 59 y.o. female, with history of multiple medical issues including DM, HTN, HLD, tobacco abuse that presents for follow-up after CTA neck for evaluation of her carotid artery disease.  Seen with increasing dizziness over the last several months and has a known right ICA occlusion in the setting of bilateral carotid endarterectomies years ago.  Her left carotid endarterectomy was done 2009.  Last duplex suggested significant increasing stenosis.  She denies any recent stroke or TIA symptoms.   Also known to us  for prior fem fem bypass. On 07/06/2023 she underwent revision of her right to left femorofemoral bypass with left femoral endarterectomy and repair of the left femoral pseudoaneurysm with dacron interposition.  Pseudoaneurysm was noted on surveillance imaging. Patient initially underwent right to left femorofemoral bypass by Dr. Dyane in 2003 using 7 mm Dacron.  She had to have a revision later with bilateral femoral patch angioplasty in 2005. She has also undergone a right common iliac artery stent here by Dr. Dyane and  another iliac stent at [facility].                    Past Medical History:  Diagnosis Date   Allergy     Anemia     Anxiety      on Alprazolam    Arthritis      both hips   Asthma     Bilateral carotid artery stenosis 10/18/2014   Cancer (HCC)      skin cancer   Carotid artery occlusion      a. s/p prior endarterectomies with known RICA occlusion - followed by [facility].   Carotid stenosis 11/12/2011    S/p bilat CEA. Right 100% occluded, left 50-70% stenosed.     Cerebral infarction (HCC) 06/22/2014    IMO SNOMED Dx Update Oct 2024     COPD (chronic obstructive pulmonary disease) (HCC)     Depression      on Effexor    Diabetes mellitus with circulatory complication (HCC)      2011   Diastolic dysfunction without heart failure     DM (diabetes mellitus) with complications (HCC) 08/29/2014   GERD (gastroesophageal reflux disease)     Heart murmur      was seen by [facility]ologist in [facility] 2013   History of kidney stones     HLD (hyperlipidemia) 08/29/2014   HTN (hypertension), benign 08/29/2014   Hyperlipidemia     Hypertension     Mild aortic stenosis 04/29/2022   Occlusion and stenosis of carotid artery without mention  of cerebral infarction 04/12/2014   PAD (peripheral artery disease) 07/06/2023   Pseudoaneurysm of femoral artery 06/16/2023   Pseudoaneurysm of left femoral artery 07/06/2023   PVD (peripheral vascular disease)      a. s/p prior LE bypass grafting and revision (followed by [facility]).   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   Shingles 06/07/2016   Stroke (HCC) 06/22/2014    TIA   TIA (transient ischemic attack)      a. possible mini stroke in 2015 - was told she did not have full stroke.               Past Surgical History:  Procedure Laterality Date   2 stents       ABDOMINAL AORTOGRAM W/LOWER EXTREMITY N/A 01/20/2019    Procedure: ABDOMINAL AORTOGRAM W/LOWER EXTREMITY;  Surgeon: Gretta Lonni PARAS, MD;  Location: [facility];   Service: Cardiovascular;  Laterality: N/A;   CAROTID ENDARTERECTOMY        x 2   CESAREAN SECTION       CHOLECYSTECTOMY       EXTRACORPOREAL SHOCK WAVE LITHOTRIPSY Left 03/29/2018    Procedure: LEFT EXTRACORPOREAL SHOCK WAVE LITHOTRIPSY (ESWL);  Surgeon: Matilda Senior, MD;  Location: [facility];  Service: Urology;  Laterality: Left;   FALSE ANEURYSM REPAIR Left 07/06/2023    Procedure: REPAIR OF LEFT FEMORAL PSEUDOANEURYSM, LEFT COMMON FEMORAL ENDARECTOMY, REDO EXPOSURE OF LEFT COMMON FEMORAL ARTERY GREATER THAN 30 DAYS, APPLICATION OF MYRIDAD POWDER, APPLICATION OF WOUND VAC SYSTEM;  Surgeon: Gretta Lonni PARAS, MD;  Location: [facility] OR;  Service: Vascular;  Laterality: Left;   FEMORAL BYPASS        x 2 on right side due to re-stenosis   FEMORAL-FEMORAL BYPASS GRAFT Bilateral 07/06/2023    Procedure: REVISION OF FEMORAL-FEMORAL ARTERY BYPASS WITH NEW INTERPOSITION;  Surgeon: Gretta Lonni PARAS, MD;  Location: [facility] OR;  Service: Vascular;  Laterality: Bilateral;   left/right carotid artery        2003 and 2009 for right, right side is 100% occluded per patient; left side 2009   TUBAL LIGATION                   Family History  Problem Relation Age of Onset   Diabetes Mother     Heart disease Mother     Hyperlipidemia Mother     Hypertension Mother     Kidney failure Mother     Heart disease Father          MI, unknown age   Hyperlipidemia Father     Hypertension Father     Skin cancer Sister     Varicose Veins Sister     Diabetes Maternal Grandmother     Heart disease Maternal Grandmother     Hyperlipidemia Maternal Grandmother     Cancer Paternal Grandmother          unknown type- possibly stomach   Heart attack Brother          Age 75   Liver disease Son          fatty liver   Colon cancer Neg Hx     Esophageal cancer Neg Hx     Breast cancer Neg Hx            SOCIAL HISTORY: Social History         Socioeconomic History   Marital status: Married      Spouse name:  Not on file   Number of  children: 1   Years of education: College   Highest education level: Not on file  Occupational History   Occupation: retired/disabled      Employer: UNEMPLOYED  Tobacco Use   Smoking status: Every Day      Current packs/day: 1.00      Average packs/day: 1 pack/day for 40.0 years (40.0 ttl pk-yrs)      Types: Cigarettes   Smokeless tobacco: Never  Vaping Use   Vaping status: Never Used  Substance and Sexual Activity   Alcohol use: Yes      Alcohol/week: 0.0 standard drinks of alcohol      Comment: holidays/special occas 3-4 drinks   Drug use: No   Sexual activity: Not Currently      Partners: Male  Other Topics Concern   Not on file  Social History Narrative    Pt is from [place]    Patient lives at home with her husband and son    Patient is right handed    Patient drinks coffee daily    Social Drivers of Health        Tobacco Use: High Risk (12/06/2024)    Patient History     Smoking Tobacco Use: Every Day     Smokeless Tobacco Use: Never     Passive Exposure: Not on file  Financial Resource Strain: Not on file  Food Insecurity: No Food Insecurity (07/06/2023)    Hunger Vital Sign     Worried About Running Out of Food in the Last Year: Never true     Ran Out of Food in the Last Year: Never true  Transportation Needs: No Transportation Needs (07/06/2023)    PRAPARE - Therapist, Art (Medical): No     Lack of Transportation (Non-Medical): No  Physical Activity: Not on file  Stress: Not on file  Social Connections: Not on file  Intimate Partner Violence: Not At Risk (07/06/2023)    Humiliation, Afraid, Rape, and Kick questionnaire     Fear of Current or Ex-Partner: No     Emotionally Abused: No     Physically Abused: No     Sexually Abused: No  Depression (PHQ2-9): Not on file  Alcohol Screen: Not on file  Housing: Low Risk (07/06/2023)    Housing     Last Housing Risk Score: 0  Utilities: At Risk  (07/06/2023)    AHC Utilities     Threatened with loss of utilities: Yes  Health Literacy: Not on file      Allergies       Allergies  Allergen Reactions   Rosuvastatin  Nausea And Vomiting   Atorvastatin  Other (See Comments)      Muscle cramps   Iohexol  Hives      Patient should take 13 hours prep    Other Hives      Contrast dye                Current Outpatient Medications  Medication Sig Dispense Refill   aspirin  81 MG tablet Take 1 tablet (81 mg total) by mouth daily. 30 tablet 11   cetirizine  (ZYRTEC ) 10 MG tablet Take 10 mg by mouth daily as needed for allergies.       clopidogrel  (PLAVIX ) 75 MG tablet Take 1 tablet (75 mg total) by mouth daily. 90 tablet 3   diphenhydrAMINE  (BENADRYL ) 50 MG capsule Take one capsule 1 hour prior to scan. 1 capsule 0   Evolocumab  (REPATHA  SURECLICK) 140 MG/ML  SOAJ Inject 140 mg into the skin every 14 (fourteen) days. 6 mL 3   FARXIGA 10 MG TABS tablet Take 10 mg by mouth daily.       fluticasone  (FLONASE ) 50 MCG/ACT nasal spray Place 2 sprays into both nostrils as needed for allergies or rhinitis.       Fluticasone -Umeclidin-Vilant 200-62.5-25 MCG/ACT AEPB Inhale 1 puff into the lungs daily.       Lancets MISC Test blood sugar once daily. 100 each 2   lisinopril  (PRINIVIL ,ZESTRIL ) 2.5 MG tablet Take 1 tablet (2.5 mg total) by mouth daily. 90 tablet 3   metFORMIN  (GLUCOPHAGE ) 1000 MG tablet Take 1 tablet (1,000 mg total) by mouth 2 (two) times daily with a meal. 180 tablet 3   nitroGLYCERIN  (NITROSTAT ) 0.4 MG SL tablet Place 1 tablet (0.4 mg total) under the tongue every 5 (five) minutes as needed. 25 tablet 6   omeprazole  (PRILOSEC) 40 MG capsule Take 1 capsule (40 mg total) by mouth daily. 90 capsule 4   pantoprazole  (PROTONIX ) 40 MG tablet Take 1 tablet (40 mg total) by mouth daily. 90 tablet 3   predniSONE  (DELTASONE ) 50 MG tablet Take one tablet 13 hours, 7 hours, and 1 hour prior to scan. 3 tablet 0   simvastatin  (ZOCOR ) 40 MG tablet  Take 40 mg by mouth daily.       Venlafaxine  HCl 225 MG TB24 Take 1 tablet by mouth daily.                   Current Facility-Administered Medications  Medication Dose Route Frequency Provider Last Rate Last Admin   0.9 %  sodium chloride  infusion  500 mL Intravenous Continuous Armbruster, Elspeth SQUIBB, MD       0.9 %  sodium chloride  infusion  500 mL Intravenous Once Nandigam, Kavitha V, MD            REVIEW OF SYSTEMS:  [X]  denotes positive finding, [ ]  denotes negative finding Cardiac   Comments:  Chest pain or chest pressure:      Shortness of breath upon exertion:      Short of breath when lying flat:      Irregular heart rhythm:             Vascular      Pain in calf, thigh, or hip brought on by ambulation:      Pain in feet at night that wakes you up from your sleep:       Blood clot in your veins:      Leg swelling:              Pulmonary      Oxygen at home:      Productive cough:       Wheezing:              Neurologic      Sudden weakness in arms or legs:       Sudden numbness in arms or legs:       Sudden onset of difficulty speaking or slurred speech:      Temporary loss of vision in one eye:       Problems with dizziness:              Gastrointestinal      Blood in stool:       Vomited blood:              Genitourinary      Burning when  urinating:       Blood in urine:             Psychiatric      Major depression:              Hematologic      Bleeding problems:      Problems with blood clotting too easily:             Skin      Rashes or ulcers:             Constitutional      Fever or chills:          PHYSICAL EXAM:     Vitals:    12/06/24 1404 12/06/24 1406  BP: (!) 119/58 (!) 145/65  Pulse: 78    Resp: 16    Temp: 98.4 F (36.9 C)    TempSrc: Temporal    SpO2: 98%    Weight: 125 lb 11.2 oz (57 kg)    Height: 5' 3 (1.6 m)          GENERAL: The patient is a well-nourished female, in no acute distress. The vital signs are  documented above. CARDIAC: There is a regular rate and rhythm.  VASCULAR:  Bilateral neck incisions NEURO: Grossly intact with no deficit.     DATA:      Carotid duplex 10/14/2024 shows occluded right carotid with 60 to 79% left ICA stenosis with left ICA to CCA ratio over 4.5 suggesting high grade stenosis with left ICA velocities 439/97.   Femorofemoral duplex 10/14/2024 shows patent right to left graft   ABI 10/14/24 are 0.55 on the right monophasic and 0.77 on the left multiphasic   Assessment/Plan:   59 y.o. female, with history of multiple medical issues including DM, HTN, HLD, tobacco abuse that presents for follow-up after CTA neck for evaluation of her carotid artery disease.  Seen with increasing dizziness over the last several months and has a known right ICA occlusion in the setting of bilateral carotid endarterectomies years ago.  Her left carotid endarterectomy was done 2009.  Last duplex suggest significant increasing stenosis in the left ICA.   I suspect after review of her CTA and looking at the ICA to CCA ratio on the left that she has recurrent high-grade stenosis in setting of prior left CEA.  I have recommended left carotid revascularization for stroke risk reduction and also to help her dizziness as she is symptomatic from the lesion in regard.  Her right carotid is occluded chronically and no role for revascularization of the right carotid as we discussed.  I discussed given prior left carotid endarterectomy would recommend left TCAR with stenting using flow reversal for distal embolic protection.  I discussed 1% stroke risk.  She needs to remain on aspirin  Plavix  statin.  Will get scheduled at her convenience.  Plavix  refilled today.  Again discussed this is for stroke risk reduction but I am also hopeful it may help with her dizziness.   Lonni DOROTHA Gaskins, MD [facility] Office: [phone]

## 2025-01-02 NOTE — Transfer of Care (Signed)
 Immediate Anesthesia Transfer of Care Note  Patient: Andrea [name]  Procedure(s) Performed: LERETHA ARTERY REVASCULARIZATION (TCAR) (Left)  Patient Location: PACU  Anesthesia Type:General  Level of Consciousness: awake, alert , oriented, and drowsy  Airway & Oxygen Therapy: Patient Spontanous Breathing and Patient connected to nasal cannula oxygen  Post-op Assessment: Report given to RN and Post -op Vital signs reviewed and stable  Post vital signs: Reviewed and stable  Last Vitals:  Vitals Value Taken Time  BP 174/68 01/02/25 15:34  Temp    Pulse 77 01/02/25 15:39  Resp 19 01/02/25 15:39  SpO2 97 % 01/02/25 15:39  Vitals shown include unfiled device data.  Last Pain:  Vitals:   01/02/25 1119  TempSrc:   PainSc: 0-No pain      Patients Stated Pain Goal: 0 (01/02/25 1119)  Complications: No notable events documented.

## 2025-01-03 ENCOUNTER — Other Ambulatory Visit (HOSPITAL_COMMUNITY): Payer: Self-pay

## 2025-01-03 ENCOUNTER — Encounter (HOSPITAL_COMMUNITY): Payer: Self-pay | Admitting: Vascular Surgery

## 2025-01-03 DIAGNOSIS — Z48812 Encounter for surgical aftercare following surgery on the circulatory system: Secondary | ICD-10-CM

## 2025-01-03 LAB — POCT ACTIVATED CLOTTING TIME: Activated Clotting Time: 286 s

## 2025-01-03 MED ORDER — OXYCODONE HCL 5 MG PO TABS
5.0000 mg | ORAL_TABLET | ORAL | 0 refills | Status: AC | PRN
  Filled 2025-01-03: qty 10, 2d supply, fill #0

## 2025-01-03 NOTE — Progress Notes (Addendum)
" °  Progress Note    01/03/2025 7:54 AM 1 Day Post-Op  Subjective: Says her neck feels a little bit sore, otherwise doing well    Vitals:   01/03/25 0400 01/03/25 0414  BP:  (!) 139/49  Pulse:  65  Resp: (P) 18 20  Temp:  97.6 F (36.4 C)  SpO2:  94%    Physical Exam: General: Sitting up in bed, NAD Cardiac: Regular Lungs: Nonlabored Incisions: Left-sided neck incision intact and dry Neuro: no slurred speech, no tongue deviation, no sensory or motor deficits  CBC    Component Value Date/Time   WBC 11.1 (H) 01/02/2025 1907   RBC 4.63 01/02/2025 1907   HGB 13.4 01/02/2025 1907   HGB 14.5 08/10/2019 1626   HCT 40.0 01/02/2025 1907   HCT 43.9 08/10/2019 1626   PLT 260 01/02/2025 1907   PLT 360 08/10/2019 1626   MCV 86.4 01/02/2025 1907   MCV 89 08/10/2019 1626   MCH 28.9 01/02/2025 1907   MCHC 33.5 01/02/2025 1907   RDW 15.4 01/02/2025 1907   RDW 13.5 08/10/2019 1626   LYMPHSABS 3.4 04/09/2023 1155   LYMPHSABS 4.0 (H) 06/09/2018 1516   MONOABS 0.8 04/09/2023 1155   EOSABS 0.2 04/09/2023 1155   EOSABS 0.3 06/09/2018 1516   BASOSABS 0.1 04/09/2023 1155   BASOSABS 0.1 06/09/2018 1516    BMET    Component Value Date/Time   NA 138 12/29/2024 1530   NA 140 04/11/2021 0916   K 4.3 12/29/2024 1530   CL 100 12/29/2024 1530   CO2 26 12/29/2024 1530   GLUCOSE 91 12/29/2024 1530   BUN 6 12/29/2024 1530   BUN 11 04/11/2021 0916   CREATININE 0.59 01/02/2025 1907   CREATININE 0.59 01/03/2016 1752   CALCIUM  9.6 12/29/2024 1530   GFRNONAA >60 01/02/2025 1907   GFRNONAA >89 01/03/2016 1752   GFRAA 118 08/10/2019 1626   GFRAA >89 01/03/2016 1752    INR    Component Value Date/Time   INR 1.0 12/29/2024 1530     Intake/Output Summary (Last 24 hours) at 01/03/2025 0754 Last data filed at 01/03/2025 0610 Gross per 24 hour  Intake 1620 ml  Output 600 ml  Net 1020 ml      Assessment/Plan:  59 y.o. female is 1 day postop, s/p: Left TCAR   - She says she is  doing fairly well this morning.  She endorses some soreness in her neck incision but this is controlled with pain medication -She remains neurologically intact without any slurred speech, tongue deviation, or sensorimotor deficits -Left-sided neck incision is soft, intact, and dry without hematoma -She is tolerating a normal diet without swallowing difficulty.  She is mobilizing at baseline -She remains hemodynamically stable without hypotension or bradycardia -She is stable for discharge home today.  Will arrange follow-up with our office in 4 weeks with carotid duplex.  Continue aspirin , Plavix , and statin   McKenzi Schuh, PA-C [facility] [phone] 01/03/2025 7:54 AM   I have seen and evaluated the patient. I agree with the PA note as documented above.  Postop day 1 status post left TCAR for recurrent high-grade stenosis in setting of prior remote carotid endarterectomy.  Grossly neurologically intact.  Neck looks good.  Groin access site looks good.  Walking in the hall.  Plan discharge today on aspirin  statin Plavix .  Follow-up in 4 weeks with duplex.  Lonni DOROTHA Gaskins, MD [facility] [place] Office: [phone]  "

## 2025-01-10 NOTE — Discharge Summary (Signed)
 " Discharge Summary     ICA DAYE 10-Feb-1966 59 y.o. female  [id number]  Admission Date: 01/02/2025  Discharge Date: 01/03/2025  Physician: [facility]  Admission Diagnosis: Bilateral carotid artery stenosis [I65.23] Asymptomatic stenosis of left carotid artery [I65.22]  Discharge Day Diagnosis: Bilateral carotid artery stenosis [I65.23] Asymptomatic stenosis of left carotid artery [facility] Course:  The patient was admitted to the hospital and taken to the operating room on 01/02/2025 and underwent  left TCAR.  The pt tolerated the procedure well and was transported to the PACU in excellent condition.  By POD 1, the pt neuro status was intact.  Her left sided neck incision was well-appearing without hematoma.  She was tolerating a normal diet without swallowing difficulty.  She was hemodynamically stable.  She was voiding without difficulty and mobilizing at baseline.  She was discharged home on POD 1.  No results for input(s): NA, K, CL, CO2, GLUCOSE, BUN, CALCIUM  in the last 72 hours.  Invalid input(s): CRATININE No results for input(s): WBC, HGB, HCT, PLT in the last 72 hours. No results for input(s): INR in the last 72 hours.   Discharge Instructions     Call MD for:  redness, tenderness, or signs of infection (pain, swelling, redness, odor or green/yellow discharge around incision site)   Complete by: As directed    Call MD for:  severe uncontrolled pain   Complete by: As directed    Call MD for:  temperature >100.4   Complete by: As directed    Discharge wound care:   Complete by: As directed    Wash incision daily with soap and water, then pat dry   Increase activity slowly   Complete by: As directed        Discharge Diagnosis:  Bilateral carotid artery stenosis [I65.23] Asymptomatic stenosis of left carotid artery [I65.22]  Secondary Diagnosis: Patient Active Problem List   Diagnosis Date Noted   Asymptomatic  stenosis of left carotid artery 01/02/2025   Asthma    COPD (chronic obstructive pulmonary disease) (HCC)    GERD (gastroesophageal reflux disease)    PAD (peripheral artery disease) 07/06/2023   Pseudoaneurysm of left femoral artery 07/06/2023   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   Pseudoaneurysm of femoral artery 06/16/2023   Allergy 04/29/2022   Anemia 04/29/2022   Anxiety 04/29/2022   Arthritis 04/29/2022   Cancer (HCC) 04/29/2022   Carotid artery occlusion 04/29/2022   Depression 04/29/2022   Diabetes mellitus with circulatory complication (HCC) 04/29/2022   Diastolic dysfunction without heart failure 04/29/2022   Heart murmur 04/29/2022   History of kidney stones 04/29/2022   Hyperlipidemia 04/29/2022   Hypertension 04/29/2022   Mild aortic stenosis 04/29/2022   Shingles 06/07/2016   Bilateral carotid artery stenosis 10/18/2014   TIA (transient ischemic attack) 08/29/2014   HLD (hyperlipidemia) 08/29/2014   DM (diabetes mellitus) with complications (HCC) 08/29/2014   HTN (hypertension), benign 08/29/2014   Stroke (HCC) 06/22/2014   Cerebral infarction (HCC) 06/22/2014   PVD (peripheral vascular disease) 04/12/2014   Occlusion and stenosis of carotid artery without mention of cerebral infarction 04/12/2014   Carotid stenosis 11/12/2011   Past Medical History:  Diagnosis Date   Allergy    Anemia    Anxiety    on Alprazolam    Arthritis    both hips   Asthma    Bilateral carotid artery stenosis 10/18/2014   Cancer (HCC)    skin cancer   Carotid artery occlusion    a. s/p  prior endarterectomies with known RICA occlusion - followed by [facility].   Carotid stenosis 11/12/2011   S/p bilat CEA. Right 100% occluded, left 50-70% stenosed.     Cerebral infarction (HCC) 06/22/2014   IMO SNOMED Dx Update Oct 2024     COPD (chronic obstructive pulmonary disease) (HCC)    Depression    on Effexor    Diabetes mellitus with circulatory complication (HCC)    2011    Diastolic dysfunction without heart failure    DM (diabetes mellitus) with complications (HCC) 08/29/2014   GERD (gastroesophageal reflux disease)    Heart murmur    was seen by cardiologist in [facility] 2013   History of kidney stones    HLD (hyperlipidemia) 08/29/2014   HTN (hypertension), benign 08/29/2014   Hyperlipidemia    Hypertension    Mild aortic stenosis 04/29/2022   Occlusion and stenosis of carotid artery without mention of cerebral infarction 04/12/2014   PAD (peripheral artery disease) 07/06/2023   Pseudoaneurysm of femoral artery 06/16/2023   Pseudoaneurysm of left femoral artery 07/06/2023   PVD (peripheral vascular disease)    a. s/p prior LE bypass grafting and revision (followed by [facility]).   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 possible mini stroke in 2015 - was told she did not have full stroke.    Allergies as of 01/03/2025       Reactions   Crestor  [rosuvastatin ] Nausea And Vomiting, Other (See Comments)   Myalgias    Iodinated Contrast Media Hives   Iohexol  Hives   Patient requires 13 hour prep with prednisone  and Benadryl    Lipitor [atorvastatin ] Other (See Comments)   Myalgias. Pt is taking this.        Medication List     TAKE these medications    acetaminophen  500 MG tablet Commonly known as: TYLENOL  Take 500 mg by mouth 2 (two) times daily as needed for headache or fever (pain).   aspirin  81 MG tablet Take 1 tablet (81 mg total) by mouth daily.   cetirizine  10 MG tablet Commonly known as: ZYRTEC  Take 10 mg by mouth daily as needed for allergies.   clopidogrel  75 MG tablet Commonly known as: PLAVIX  Take 1 tablet (75 mg total) by mouth daily.   fluticasone  50 MCG/ACT nasal spray Commonly known as: FLONASE  Place 1-2 sprays into both nostrils daily as needed for allergies or rhinitis.   Fluticasone -Umeclidin-Vilant 200-62.5-25  MCG/ACT Aepb Inhale 1 puff into the lungs daily as needed (shortness of breath).   Lancets Misc Test blood sugar once daily.   lisinopril  2.5 MG tablet Commonly known as: ZESTRIL  Take 1 tablet (2.5 mg total) by mouth daily.   metFORMIN  1000 MG tablet Commonly known as: GLUCOPHAGE  Take 1 tablet (1,000 mg total) by mouth 2 (two) times daily with a meal. What changed: when to take this   nitroGLYCERIN  0.4 MG SL tablet Commonly known as: NITROSTAT  Place 1 tablet (0.4 mg total) under the tongue every 5 (five) minutes as needed.   oxyCODONE  5 MG immediate release tablet Commonly known as: Oxy IR/ROXICODONE  Take 1 tablet (5 mg total) by mouth every 4 (four) hours as needed for moderate pain (pain score 4-6).   pantoprazole  40 MG tablet Commonly known as: PROTONIX  Take 1 tablet (40 mg total) by mouth daily.   simvastatin  80 MG tablet Commonly known as: ZOCOR  Take 80 mg by mouth daily.  Venlafaxine  HCl 225 MG Tb24 Take 225 mg by mouth daily.               Discharge Care Instructions  (From admission, onward)           Start     Ordered   01/03/25 0000  Discharge wound care:       Comments: Wash incision daily with soap and water, then pat dry   01/03/25 0802             Discharge Instructions:   [facility] Discharge Instructions Carotid Revascularization  Please refer to the following instructions for your post-procedure care. Your surgeon or physician assistant will discuss any changes with you.  Activity  You are encouraged to walk as much as you can. You can slowly return to normal activities but must avoid strenuous activity and heavy lifting until your doctor tell you it's OK. Avoid activities such as vacuuming or swinging a golf club. You can drive after one week if you are comfortable and you are no longer taking prescription pain medications. It is normal to feel tired for serval weeks after your surgery. It is  also normal to have difficulty with sleep habits, eating, and bowel movements after surgery. These will go away with time.  Bathing/Showering  You may shower after you come home. Do not soak in a bathtub, hot tub, or swim until the incision heals completely.  Incision Care  Shower every day. Clean your incision with mild soap and water. Pat the area dry with a clean towel. You do not need a bandage unless otherwise instructed. Do not apply any ointments or creams to your incision. You may have skin glue on your incision. Do not peel it off. It will come off on its own in about one week. Your incision may feel thickened and raised for several weeks after your surgery. This is normal and the skin will soften over time. For Men Only: It's OK to shave around the incision but do not shave the incision itself for 2 weeks. It is common to have numbness under your chin that could last for several months.  Diet  Resume your normal diet. There are no special food restrictions following this procedure. A low fat/low cholesterol diet is recommended for all patients with vascular disease. In order to heal from your surgery, it is CRITICAL to get adequate nutrition. Your body requires vitamins, minerals, and protein. Vegetables are the best source of vitamins and minerals. Vegetables also provide the perfect balance of protein. Processed food has little nutritional value, so try to avoid this.  Medications  Resume taking all of your medications unless your doctor or physician assistant tells you not to.  If your incision is causing pain, you may take over-the- counter pain relievers such as acetaminophen  (Tylenol ). If you were prescribed a stronger pain medication, please be aware these medications can cause nausea and constipation.  Prevent nausea by taking the medication with a snack or meal. Avoid constipation by drinking plenty of fluids and eating foods with a high amount of fiber, such as fruits, vegetables,  and grains. Do not take Tylenol  if you are taking prescription pain medications.  Follow Up  Our office will schedule a follow up appointment 2-3 weeks following discharge.  Please call us  immediately for any of the following conditions  Increased pain, redness, drainage (pus) from your incision site. Fever of 101 degrees or higher. If you should develop stroke (  slurred speech, difficulty swallowing, weakness on one side of your body, loss of vision) you should call 911 and go to the nearest emergency room.  Reduce your risk of vascular disease:  Stop smoking. If you would like help call [facility] at 1-800-QUIT-NOW (3046962000) or [place] at [phone]. Manage your cholesterol Maintain a desired weight Control your diabetes Keep your blood pressure down  If you have any questions, please call the office at [phone].  Disposition: Home  Patient's condition: is Excellent  Follow up: 1. [facility]  in 4 weeks with carotid duplex   Ahmed Holster, PA-C [facility] [phone]   --- For [facility] Registry use ---   Modified Rankin score at D/C (0-6): 0  IV medication needed for:  1. Hypertension: No 2. Hypotension: No  Post-op Complications: No  1. Post-op CVA or TIA: No  If yes: Event classification (right eye, left eye, right cortical, left cortical, verterobasilar, other):   If yes: Timing of event (intra-op, <6 hrs post-op, >=6 hrs post-op, unknown):   2. CN injury: No  If yes: CN  injuried   3. Myocardial infarction: No  If yes: Dx by (EKG or clinical, Troponin):   4.  CHF: No  5.  Dysrhythmia (new): No  6. Wound infection: No  7. Reperfusion symptoms: No  8. Return to OR: No  If yes: return to OR for (bleeding, neurologic, other CEA incision, other):   Discharge medications: Statin use:  Yes ASA use:  Yes   Beta blocker use:  No ACE-Inhibitor use:  Yes  ARB use:  No CCB use: No P2Y12 Antagonist use: Yes, [ x] Plavix , [  ] Plasugrel, [ ]  Ticlopinine, [ ]  Ticagrelor, [ ]  Other, [ ]  No for medical reason, [ ]  Non-compliant, [ ]  Not-indicated Anti-coagulant use:  No, [ ]  Warfarin, [ ]  Rivaroxaban, [ ]  Dabigatran,    "

## 2025-01-13 ENCOUNTER — Other Ambulatory Visit: Payer: Self-pay

## 2025-01-13 DIAGNOSIS — I6523 Occlusion and stenosis of bilateral carotid arteries: Secondary | ICD-10-CM

## 2025-02-07 ENCOUNTER — Encounter

## 2025-02-07 ENCOUNTER — Ambulatory Visit (HOSPITAL_COMMUNITY)
